# Patient Record
Sex: Female | Born: 1950 | Race: White | Hispanic: No | Smoking: Never smoker
Health system: Southern US, Community
[De-identification: ages and names within clinical notes are randomized; demographics above are authoritative.]

## PROBLEM LIST (undated history)

## (undated) ENCOUNTER — Ambulatory Visit

## (undated) ENCOUNTER — Encounter: Attending: Rheumatology | Primary: Rheumatology

## (undated) ENCOUNTER — Encounter

## (undated) ENCOUNTER — Telehealth: Attending: Ambulatory Care | Primary: Ambulatory Care

## (undated) ENCOUNTER — Telehealth

## (undated) ENCOUNTER — Ambulatory Visit: Payer: MEDICARE | Attending: Rheumatology | Primary: Rheumatology

## (undated) ENCOUNTER — Ambulatory Visit: Payer: MEDICARE

## (undated) ENCOUNTER — Encounter: Attending: Ambulatory Care | Primary: Ambulatory Care

## (undated) ENCOUNTER — Encounter: Attending: Pharmacist | Primary: Pharmacist

## (undated) DIAGNOSIS — I272 Pulmonary hypertension, unspecified: Secondary | ICD-10-CM

## (undated) DIAGNOSIS — D649 Anemia, unspecified: Secondary | ICD-10-CM

## (undated) DIAGNOSIS — T7840XA Allergy, unspecified, initial encounter: Secondary | ICD-10-CM

## (undated) DIAGNOSIS — M810 Age-related osteoporosis without current pathological fracture: Secondary | ICD-10-CM

## (undated) DIAGNOSIS — Z5189 Encounter for other specified aftercare: Secondary | ICD-10-CM

## (undated) DIAGNOSIS — I2510 Atherosclerotic heart disease of native coronary artery without angina pectoris: Secondary | ICD-10-CM

## (undated) DIAGNOSIS — I509 Heart failure, unspecified: Secondary | ICD-10-CM

## (undated) DIAGNOSIS — J849 Interstitial pulmonary disease, unspecified: Secondary | ICD-10-CM

## (undated) DIAGNOSIS — E119 Type 2 diabetes mellitus without complications: Secondary | ICD-10-CM

## (undated) DIAGNOSIS — H269 Unspecified cataract: Secondary | ICD-10-CM

## (undated) DIAGNOSIS — I10 Essential (primary) hypertension: Secondary | ICD-10-CM

## (undated) DIAGNOSIS — M069 Rheumatoid arthritis, unspecified: Secondary | ICD-10-CM

## (undated) HISTORY — DX: Interstitial pulmonary disease, unspecified: J84.9

## (undated) HISTORY — DX: Type 2 diabetes mellitus without complications: E11.9

## (undated) HISTORY — DX: Atherosclerotic heart disease of native coronary artery without angina pectoris: I25.10

## (undated) HISTORY — DX: Essential (primary) hypertension: I10

## (undated) HISTORY — DX: Allergy, unspecified, initial encounter: T78.40XA

## (undated) HISTORY — DX: Heart failure, unspecified: I50.9

## (undated) HISTORY — PX: CORONARY ANGIOPLASTY: SHX604

## (undated) HISTORY — DX: Anemia, unspecified: D64.9

## (undated) HISTORY — DX: Pulmonary hypertension, unspecified: I27.20

## (undated) HISTORY — DX: Age-related osteoporosis without current pathological fracture: M81.0

## (undated) HISTORY — DX: Encounter for other specified aftercare: Z51.89

## (undated) HISTORY — PX: BREAST BIOPSY: SHX20

## (undated) HISTORY — DX: Unspecified cataract: H26.9

---
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
## 2009-05-25 ENCOUNTER — Emergency Department: Payer: Self-pay | Admitting: Emergency Medicine

## 2009-05-26 ENCOUNTER — Ambulatory Visit: Payer: Self-pay | Admitting: Internal Medicine

## 2009-05-31 ENCOUNTER — Ambulatory Visit: Payer: Self-pay | Admitting: Internal Medicine

## 2009-06-07 ENCOUNTER — Ambulatory Visit: Payer: Self-pay | Admitting: Internal Medicine

## 2009-06-15 ENCOUNTER — Ambulatory Visit: Payer: Self-pay | Admitting: Specialist

## 2009-06-22 ENCOUNTER — Ambulatory Visit: Payer: Self-pay | Admitting: Internal Medicine

## 2009-06-29 ENCOUNTER — Ambulatory Visit: Payer: Self-pay | Admitting: Internal Medicine

## 2009-07-05 ENCOUNTER — Ambulatory Visit: Payer: Self-pay | Admitting: Internal Medicine

## 2009-07-13 ENCOUNTER — Ambulatory Visit: Payer: Self-pay | Admitting: Internal Medicine

## 2009-07-21 ENCOUNTER — Ambulatory Visit: Payer: Self-pay | Admitting: Internal Medicine

## 2009-07-27 ENCOUNTER — Ambulatory Visit: Payer: Self-pay | Admitting: Internal Medicine

## 2010-08-15 ENCOUNTER — Ambulatory Visit: Payer: Self-pay | Admitting: Physician Assistant

## 2016-11-13 MED FILL — METHOTREXATE/25MG/ML/SOLN: METHOTREXATE/25MG/ML/SOLN | 28 days supply | Qty: 2 | Fill #2

## 2016-11-13 MED FILL — LEUCOVORIN/5MG/TAB: LEUCOVORIN/5MG/TAB | 84 days supply | Qty: 12 | Fill #1

## 2016-11-29 ENCOUNTER — Ambulatory Visit
Admission: RE | Admit: 2016-11-29 | Discharge: 2016-11-29 | Disposition: A | Discharge status: Home / Self Care | Payer: MEDICARE | Attending: Medical | Admitting: Medical

## 2016-11-29 DIAGNOSIS — Z79899 Other long term (current) drug therapy: Secondary | ICD-10-CM

## 2016-11-29 DIAGNOSIS — M05741 Rheumatoid arthritis with rheumatoid factor of right hand without organ or systems involvement: Secondary | ICD-10-CM

## 2016-11-29 DIAGNOSIS — M059 Rheumatoid arthritis with rheumatoid factor, unspecified: Principal | ICD-10-CM

## 2016-11-29 DIAGNOSIS — M05742 Rheumatoid arthritis with rheumatoid factor of left hand without organ or systems involvement: Secondary | ICD-10-CM

## 2016-11-29 DIAGNOSIS — M8580 Other specified disorders of bone density and structure, unspecified site: Secondary | ICD-10-CM

## 2016-11-29 DIAGNOSIS — M0579 Rheumatoid arthritis with rheumatoid factor of multiple sites without organ or systems involvement: Secondary | ICD-10-CM

## 2016-11-29 DIAGNOSIS — Z7952 Long term (current) use of systemic steroids: Secondary | ICD-10-CM

## 2016-11-29 DIAGNOSIS — E559 Vitamin D deficiency, unspecified: Secondary | ICD-10-CM

## 2016-11-29 DIAGNOSIS — M153 Secondary multiple arthritis: Secondary | ICD-10-CM

## 2016-11-29 MED ORDER — DICLOFENAC 1 % TOPICAL GEL
Freq: Four times a day (QID) | TOPICAL | 3 refills | 0 days | Status: CP
Start: 2016-11-29 — End: 2017-02-21

## 2016-11-29 MED ORDER — LEUCOVORIN CALCIUM 5 MG TABLET
ORAL_TABLET | ORAL | 0 refills | 0.00000 days | Status: CP
Start: 2016-11-29 — End: 2016-12-12

## 2016-11-29 MED ORDER — METHOTREXATE SODIUM 25 MG/ML INJECTION SOLUTION
SUBCUTANEOUS | 0 refills | 0.00000 days | Status: CP
Start: 2016-11-29 — End: 2016-12-12

## 2016-12-12 MED ORDER — SYRINGE WITH NEEDLE 1 ML 25 GAUGE X 5/8"
0 refills | 0.00000 days | Status: CP
Start: 2016-12-12 — End: 2016-12-12

## 2016-12-12 MED ORDER — LEUCOVORIN CALCIUM 5 MG TABLET
ORAL_TABLET | ORAL | 2 refills | 0.00000 days | Status: CP
Start: 2016-12-12 — End: 2017-08-22

## 2016-12-12 MED ORDER — SYRINGE WITH NEEDLE 1 ML 25 GAUGE X 5/8": 1 [IU] | Syringe | 0 refills | 0 days | Status: AC

## 2016-12-12 MED ORDER — LEUCOVORIN CALCIUM 5 MG TABLET: tablet | 2 refills | 0 days

## 2016-12-12 MED ORDER — METHOTREXATE SODIUM 25 MG/ML INJECTION SOLUTION: mL | 2 refills | 0 days

## 2016-12-12 MED ORDER — METHOTREXATE SODIUM 25 MG/ML INJECTION SOLUTION
SUBCUTANEOUS | 2 refills | 0.00000 days | Status: CP
Start: 2016-12-12 — End: 2016-12-12

## 2016-12-12 NOTE — Unmapped (Signed)
Addended by: Geoffry Paradise T on: 12/12/2016 03:54 PM     Modules accepted: Orders

## 2016-12-12 NOTE — Unmapped (Addendum)
Specialty Pharmacy - [facility] Rheumatology Telephone Call     Amber Stafford is a 66 y.o. female contacted today regarding assessment of her specialty medication(s): subq MTX  Amber Stafford is also on Humira but receiving it through the [facility] [employer].    Reviewed and verified with patient: Allergies - Medications -      Specialty medication(s) and dose(s) confirmed: yes  Changes to medications: no  Changes to insurance: no    Amber Stafford reports her 11/14/15 shipment of methotrexate and leucovorin did not arrive from [facility].  She filed a report with [employer] but did not have an answer as of today. She missed 3 doses of methotrexate thus far.  She is doing well on methotrexate and humira w/o any side effects. RA under controlled.  Plan to ship methotrexate, leucovorin and syringes w/ needles tomorrow for delivery on 12/14/16.  Patient will restart methotrexate on 12/14/16.      Does Amber Stafford have follow up appointment scheduled with clinic? Yes, appointment is scheduled and patient is aware    All questions were answered and contact information provided for any future questions/concerns.      Jeneen Montgomery

## 2016-12-13 MED FILL — BD TB 1mL/25G 5/8 INCH/SYR: BD TB 1mL/25G 5/8 INCH/SYR | 700 days supply | Qty: 100 | Fill #3

## 2016-12-13 MED FILL — LEUCOVORIN/5MG/TAB: LEUCOVORIN/5MG/TAB | 84 days supply | Qty: 12 | Fill #2

## 2016-12-13 MED FILL — METHOTREXATE/25MG/ML/SOLN: METHOTREXATE/25MG/ML/SOLN | 84 days supply | Qty: 6 | Fill #0

## 2017-01-21 ENCOUNTER — Ambulatory Visit
Admission: RE | Admit: 2017-01-21 | Discharge: 2017-01-21 | Admitting: Student in an Organized Health Care Education/Training Program

## 2017-01-21 DIAGNOSIS — D229 Melanocytic nevi, unspecified: Secondary | ICD-10-CM

## 2017-01-21 DIAGNOSIS — Z85828 Personal history of other malignant neoplasm of skin: Secondary | ICD-10-CM

## 2017-01-21 DIAGNOSIS — L820 Inflamed seborrheic keratosis: Principal | ICD-10-CM

## 2017-01-21 MED ORDER — TRETINOIN 0.025 % TOPICAL CREAM
Freq: Every evening | TOPICAL | 11 refills | 0.00000 days | Status: CP
Start: 2017-01-21 — End: ?

## 2017-01-21 NOTE — Unmapped (Addendum)
Topical Retinoid (Differin, adapalene, tretinoin)   The patient was encouraged to wash the face and let it dry completely before applying the retinoid, about 20 minutes.  The patient will begin using the topical every Monday and Thursday and increase the frequency to nightly as tolerated over several weeks??? time.    Patient is to avoid the upper eyelid area.  1 Pea size amount of medication should cover the whole face.  Apply by dotting small amounts on the skin.  If using to the scalp as well, an additional pea will be needed.     Please go to [email or web address] for a coupon for coverage of the medication if the insurance cost is high or the medication is not covered   Insurance often does not often cover this medication except for acne.       Cryosurgery  Cryosurgery (???freezing???) uses liquid nitrogen to destroy certain types of skin lesions. Lowering the temperature of the lesion in a small area surrounding skin destroys the lesion. Immediately following cryosurgery, you will notice redness and swelling of the treatment area. Blistering or weeping may occur, lasting approximately one week which will then be followed by crusting. Most areas will heal completely in 10 to 14 days.    Wash the treated areas daily. Allow soap and water to run over the areas, but do not scrub. Should a scab or crust form, allow it to fall off on its own. Do not remove or pick at it. Application of an ointment  and a bandage may make you feel more comfortable, but it is not necessary. Some people develop an allergy to Neosporin, so we recommend that Vaseline or  Aquaphor be used.    The cryotherapy site will be more sensitive than your surrounding skin. Keep it covered, and remember to apply sunscreen every day to all your sun exposed skin. A scar may remain which is lighter or pinker than your normal skin. Your body will continue to improve your scar for up to one year; however a light-colored scar may remain.    Infection following cryotherapy is rare. However if you are worried about the appearance of the treated area, contact your doctor. We have a physician on call at all times. If you have any concerns about the site, please call our clinic at [phone]

## 2017-01-21 NOTE — Unmapped (Signed)
ASSESSMENT AND PLAN  Amber Stafford is a 66 y.o. female who presents for follow up.    1. Inflamed Seborrheic Keratoses    -- Discussed the benign nature of the diagnosis and treatment options for inflamed lesions.  Due to history and evidence of irritation and inflammation, we recommended treatment with cryotherapy today.  -- After verbal consent was obtained a total of >15 lesions on the abdomen, back, and flank were treated wth cryotherapy x 1 ten second freeze-thaw cycle. The patient tolerated the procedure well, and was instructed on post-cryotherapy wound care.  A handout was provided.    2. Lentigines, rhytides: Tretinoin 0.025% cream applied in thin layer to face each night.  Appropriate use and expected reaction such as erythema and scale were discussed, as well as decreasing frequency of application with significant reaction.    3. History of non-melanoma skin cancer: ,We discussed the importance of sun-protective and sun-avoidance measures, as well as the ABCDEs of melanoma and the importance of regular self-skin exams.    RTC: 1 year      No Follow-up on file.     _____________________________________________________________________    SUBJECTIVE  Amber Stafford is a 66 y.o. female last seen by Dr. Janyth Contes 6 months ago returns for skin check with history of NMSC.  Points out many crusty areas that frequently are painful and bleed due to friction with her bra or other inadvertent trauma.  They bleed sometimes.    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    No additional skin concerns today.     AST MEDICAL HISTORY  Past Medical History   Diagnosis Date   ??? RA (rheumatoid arthritis) - on Humira, methotrexate and prednisone 7.5mg     ??? Skin tag    ??? Varicella       REVIEW OF SYSTEMS  Good interval health without any recent illnesses or hospitalizations.  Denies fevers, chills, fatigue and weight changes. No other skin concerns.     PHYSICAL EXAM  General: Well appearing female who is alert and oriented in no distress.  Skin: Examination of the scalp, face including lips and eyelids, neck, chest, back, abdomen, bilateral upper extremities was performed and significant for the following:  1. Scattered thick warty papules and plaques on the trunk, many with an erythematous base and excoriations.  2. Well-healed scar on R forehead

## 2017-02-19 NOTE — Unmapped (Signed)
Patient asking about information

## 2017-02-21 ENCOUNTER — Ambulatory Visit
Admission: RE | Admit: 2017-02-21 | Discharge: 2017-02-21 | Disposition: A | Discharge status: Home / Self Care | Payer: MEDICARE | Attending: Rheumatology | Admitting: Rheumatology

## 2017-02-21 DIAGNOSIS — M153 Secondary multiple arthritis: Secondary | ICD-10-CM

## 2017-02-21 DIAGNOSIS — M05749 Rheumatoid arthritis with rheumatoid factor of unspecified hand without organ or systems involvement: Principal | ICD-10-CM

## 2017-02-21 MED ORDER — TRAMADOL 50 MG TABLET
ORAL_TABLET | 3 refills | 0 days | Status: CP
Start: 2017-02-21 — End: 2017-11-21

## 2017-02-21 MED ORDER — DICLOFENAC 1 % TOPICAL GEL
Freq: Four times a day (QID) | TOPICAL | 6 refills | 0 days | Status: CP
Start: 2017-02-21 — End: 2017-08-22

## 2017-02-21 NOTE — Unmapped (Signed)
Cc:   Chief Complaint   Patient presents with   ??? Follow-up     RA    .    HPI:  The patient is a 66 y.o. 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    Ms. Voris was seen in rheumatology clinic 07/2016 at which point she was noted to have active arthritis with significant symptom burden and thus had intensification of her immunosuppression from 20mg  to 25mg  methotrexate weekly. At follow-up in 11/2016 she noted some symptomatic improvement and she was thought to have better disease control at the time. She started PRN voltaren gel at that time but had no changes to her immunosuppression. She reports that voltaren made a significant difference in her right ankle and left knee pain, which are her most symptomatic joints. Over the last 2-4 weeks, however, she has also noted an overall improvement in her joint pains and well-being. Her morning stiffness is down to an hour, whereas it had previously lasted until nearly noon on a daily basis. She has been able to wean her prednisone to 7.5mg  on most days, with 5mg  on days she is feeling especially well - this is an improvement from 10mg  daily in 07/2016 and 7.5/10mg  in 11/2016.    She had some mild GI upset when going up to 25mg  methotrexate weekly but it improved when she started to take the methotrexate at night and then take her folinic acid earlier the following day. This has been a non-issue for her as of late. She has not suffered an oral sores/ulcers. She denies any other new symptoms, diagnoses or medication changes. She has had some life stress associated with moving her elderly mother into a nursing home.    ROS:  10 systems were reviewed and negative except as noted in the HPI.    Past Medical History:   Diagnosis Date   ??? Arthritis 04/30/1970   ??? Cataract 04/30/2008   ??? Depression with anxiety    ??? DJD (degenerative joint disease), ankle and foot    ??? Fractures 11/01/1971   ??? Hypertension, benign    ??? Joint pain    ??? Primary localized osteoarthrosis, ankle and foot    ??? RA (rheumatoid arthritis) (CMS-HCC)    ??? Skin tag    ??? Varicella        MEDICATIONS:    Current Outpatient Prescriptions:   ???  adalimumab (HUMIRA) 40 mg/0.8 mL subcutaneous pen kit, Inject 0.8 mL (40 mg total) under the skin every fourteen (14) days., Disp: 2 each, Rfl: 11  ???  calcium carbonate (OS-CAL) 500 mg calcium (1,250 mg) chewable tablet, Chew 1 tablet daily., Disp: , Rfl:   ???  cholecalciferol, vitamin D3, (VITAMIN D3) 4,000 unit cap, Take by mouth., Disp: , Rfl:   ???  cyanocobalamin (VITAMIN B-12) 1000 MCG tablet, Take 100 mcg by mouth daily., Disp: , Rfl:   ???  diclofenac sodium (VOLTAREN) 1 % gel, Apply 4 g topically Four (4) times a day., Disp: 300 g, Rfl: 6  ???  leucovorin (WELLCOVORIN) 5 mg tablet, Take 1 tablet (5 mg total) by mouth once a week., Disp: 12 tablet, Rfl: 2  ???  methotrexate 25 mg/mL injection solution, Inject 1 mL (25 mg total) under the skin once a week., Disp: 12 mL, Rfl: 2  ???  multivitamin,tx-iron-minerals (COMPLETE MULTIVITAMIN) Tab, Take 1 tablet by mouth daily., Disp: ,  Rfl:   ???  omeprazole 20 mg tablet, Take 20 mg by mouth daily., Disp: , Rfl:   ???  predniSONE (DELTASONE) 5 MG tablet, Take 5 mg to 10 mg daily., Disp: 60 tablet, Rfl: 6  ???  syringe with needle (TUBERCULIN SYRINGE) 1 mL 25 gauge x 5/8 Syrg, 1 Units by Miscellaneous route every seven (7) days. To be used with methotrexate, Disp: 100 Syringe, Rfl: 0  ???  traMADol (ULTRAM) 50 mg tablet, Take 1 tablet (50 mg total) by mouth every six (6) hours as needed for pain., Disp: 90 tablet, Rfl: 3  ???  tretinoin (RETIN-A) 0.025 % cream, Apply 1 application topically nightly., Disp: 45 g, Rfl: 11    PHYSICAL EXAM:  BP 133/62  - Pulse 78  - Temp 36.3 ??C (Oral)  - Resp 16  - Ht 172.7 cm (5' 8)  - Wt 70.9 kg (156 lb 3.2 oz)  - LMP  (LMP Unknown)  - BMI 23.75 kg/m?? GENERAL: Well-appearing, sitting comfortably on chair using smart phone upon my entry. Wearing a Data processing manager. Appears stated age. Pleasant.  DERMATOLOGICAL exam: Small erythematous papules on neck bilaterally. Scattered seborrheic keratoses on back.  Ocular exam: Sclera and conjunctiva anicteric and non-injected.   ENT: Poor dentition, missing multiple teeth. Oral mucosa moist without exudates or ulcers. No palpable adenopathy.  RESPIRATORY: Normal WOB on room air. Lungs CTA.  CV: Normal rate, regular rhythm. No murmur. No JVD.    GI: Soft, non-distended. Active bowel sounds.  MUSCULOSKELETAL EXAM:  Hands with bilateral evidence of chronic RA including ulnar deviation, palmar subluxation and z-deformity of the thumb, right > left  No warmth, swelling or tenderness of wrists, MCPs, PIPs or DIPs  Normal elbow and shoulder ROM, without swelling or tenderness  Mild bilateral knee effusions without warmth or joint line tenderness, left > right  Ankylosis with surrounding soft tissue swelling but no tenderness to palpation of ankles bilaterally, right > left    IMPRESSION:  Patient appears to have gained significant control over her rheumatoid arthritis recently, for reasons that are not entirely clear. While addition of diclofenac gel could certainly account for symptomatic improvement in her DJD-affected ankle and knee, she does report improvement in symptoms suggestive of improved control of her inflammatory arthritis (less morning stiffness, overall decrease in joint pain, increased overall well-being, ability to further wean prednison). The increase in methotrexate dose in 07/2016 did show a beneficial effect by the time of her return in 11/2016 but would not be expected to cause continued improvement this long after the change. Regardless, her arthritis to be adequately controlled at this time, so further changes will not be pursued. Still favor relatively short-duration follow-up as the genesis of her improvement is not entirely clear, and may simply represent natural fluctuation in disease activity.    PLAN:  - Continue methotrexate 25mg  subQ weekly  - Continue folinic acid 5mg  weekly  - Continue humira 40mg  every two weeks  - Continue to wean prednisone to 5mg  daily as tolerated  - Influenza vaccination  - DEXA scan    RTC in 3 months

## 2017-02-27 NOTE — Unmapped (Signed)
INSURANCE PLAN IS MAKING HER SWITCH PHARMACIES. I TOLD HER WE WOULD FOLLOW UP WITH HER ONE MORE TIME IN NOVEMBER TO ENSURE SHE GOT HER MEDS BEFORE REMOVING HER FROM OUR CALL LIST. RESCHEDULING CALL TO: NOV. 26.

## 2017-04-03 MED ORDER — METHOTREXATE SODIUM 25 MG/ML INJECTION SOLUTION
SUBCUTANEOUS | 6 refills | 0 days | Status: CP
Start: 2017-04-03 — End: 2017-08-20

## 2017-04-03 MED ORDER — LEUCOVORIN CALCIUM 5 MG TABLET
ORAL_TABLET | ORAL | 12 refills | 0 days | Status: CP
Start: 2017-04-03 — End: 2017-08-22

## 2017-04-03 NOTE — Unmapped (Signed)
Labs done 11/29/16

## 2017-04-09 NOTE — Unmapped (Signed)
PT STATED THAT SHE IS GETTING NEXT ROUND FROM ANOTHER PHARMACY. I WILL CALL BACK AND TOUCH BASE IN January.

## 2017-05-15 NOTE — Unmapped (Signed)
Yearly colon cancer screening due; FIT order placed and released. FIT mailed to home address in EPIC with letter explaining the reason and instructions for the kit.  Patient notified via MyChart re same.

## 2017-05-20 NOTE — Unmapped (Signed)
PT CAN NO LONGER FILL AT [facility]. DISENROLLING PT.

## 2017-05-28 MED ORDER — PREDNISONE 5 MG TABLET
ORAL_TABLET | 6 refills | 0 days | Status: CP
Start: 2017-05-28 — End: 2017-08-22

## 2017-05-30 ENCOUNTER — Ambulatory Visit: Admit: 2017-05-30 | Discharge: 2017-05-31 | Payer: MEDICARE

## 2017-05-30 DIAGNOSIS — Z1211 Encounter for screening for malignant neoplasm of colon: Principal | ICD-10-CM

## 2017-08-20 MED ORDER — METHOTREXATE SODIUM 25 MG/ML INJECTION SOLUTION
SUBCUTANEOUS | 4 refills | 0.00000 days | Status: CP
Start: 2017-08-20 — End: 2017-08-22

## 2017-08-22 ENCOUNTER — Ambulatory Visit: Admit: 2017-08-22 | Discharge: 2017-08-23 | Payer: MEDICARE | Attending: Rheumatology | Primary: Rheumatology

## 2017-08-22 DIAGNOSIS — M810 Age-related osteoporosis without current pathological fracture: Secondary | ICD-10-CM

## 2017-08-22 DIAGNOSIS — M059 Rheumatoid arthritis with rheumatoid factor, unspecified: Secondary | ICD-10-CM

## 2017-08-22 DIAGNOSIS — M05749 Rheumatoid arthritis with rheumatoid factor of unspecified hand without organ or systems involvement: Principal | ICD-10-CM

## 2017-08-22 DIAGNOSIS — Z79899 Other long term (current) drug therapy: Secondary | ICD-10-CM

## 2017-08-22 DIAGNOSIS — M153 Secondary multiple arthritis: Secondary | ICD-10-CM

## 2017-08-22 DIAGNOSIS — M05721 Rheumatoid arthritis with rheumatoid factor of right elbow without organ or systems involvement: Secondary | ICD-10-CM

## 2017-08-22 DIAGNOSIS — M05722 Rheumatoid arthritis with rheumatoid factor of left elbow without organ or systems involvement: Secondary | ICD-10-CM

## 2017-08-22 MED ORDER — SYRINGE WITH NEEDLE 1 ML 25 GAUGE X 5/8"
INJECTION | 3 refills | 0 days | Status: CP
Start: 2017-08-22 — End: 2018-02-13

## 2017-08-22 MED ORDER — METHOTREXATE SODIUM 25 MG/ML INJECTION SOLUTION
SUBCUTANEOUS | 6 refills | 0 days | Status: CP
Start: 2017-08-22 — End: 2018-02-13

## 2017-08-22 MED ORDER — DICLOFENAC 1 % TOPICAL GEL
Freq: Four times a day (QID) | TOPICAL | 6 refills | 0 days | Status: CP
Start: 2017-08-22 — End: 2018-08-22

## 2017-08-22 MED ORDER — LEUCOVORIN CALCIUM 5 MG TABLET
ORAL_TABLET | ORAL | 6 refills | 0.00000 days | Status: CP
Start: 2017-08-22 — End: 2017-11-21

## 2017-08-22 MED ORDER — PREDNISONE 5 MG TABLET
ORAL_TABLET | Freq: Every day | ORAL | 6 refills | 0 days | Status: CP
Start: 2017-08-22 — End: 2018-02-13

## 2017-09-04 ENCOUNTER — Ambulatory Visit: Admit: 2017-09-04 | Discharge: 2017-09-04 | Payer: MEDICARE

## 2017-09-04 DIAGNOSIS — M810 Age-related osteoporosis without current pathological fracture: Principal | ICD-10-CM

## 2017-09-04 DIAGNOSIS — M05749 Rheumatoid arthritis with rheumatoid factor of unspecified hand without organ or systems involvement: Secondary | ICD-10-CM

## 2017-11-21 ENCOUNTER — Ambulatory Visit: Admit: 2017-11-21 | Discharge: 2017-11-21 | Payer: MEDICARE

## 2017-11-21 DIAGNOSIS — Z79899 Other long term (current) drug therapy: Secondary | ICD-10-CM

## 2017-11-21 DIAGNOSIS — M059 Rheumatoid arthritis with rheumatoid factor, unspecified: Principal | ICD-10-CM

## 2017-11-21 DIAGNOSIS — M8580 Other specified disorders of bone density and structure, unspecified site: Secondary | ICD-10-CM

## 2017-11-21 DIAGNOSIS — R05 Cough: Secondary | ICD-10-CM

## 2017-11-21 DIAGNOSIS — E559 Vitamin D deficiency, unspecified: Secondary | ICD-10-CM

## 2017-11-21 MED ORDER — ADALIMUMAB 40 MG/0.8 ML SUBCUTANEOUS PEN KIT: each | 3 refills | 0 days

## 2017-11-21 MED ORDER — ADALIMUMAB 40 MG/0.8 ML SUBCUTANEOUS PEN KIT
SUBCUTANEOUS | 3 refills | 0.00000 days | Status: CP
Start: 2017-11-21 — End: 2017-11-21

## 2017-11-21 MED ORDER — TRAMADOL 50 MG TABLET
ORAL_TABLET | Freq: Four times a day (QID) | ORAL | 5 refills | 0 days | Status: CP | PRN
Start: 2017-11-21 — End: 2018-08-21

## 2017-11-21 MED ORDER — LEUCOVORIN CALCIUM 5 MG TABLET
ORAL_TABLET | ORAL | 6 refills | 0 days | Status: CP
Start: 2017-11-21 — End: 2018-02-13

## 2018-02-13 ENCOUNTER — Ambulatory Visit: Admit: 2018-02-13 | Discharge: 2018-02-14 | Payer: MEDICARE | Attending: Rheumatology | Primary: Rheumatology

## 2018-02-13 DIAGNOSIS — M05741 Rheumatoid arthritis with rheumatoid factor of right hand without organ or systems involvement: Principal | ICD-10-CM

## 2018-02-13 DIAGNOSIS — M05742 Rheumatoid arthritis with rheumatoid factor of left hand without organ or systems involvement: Secondary | ICD-10-CM

## 2018-02-13 DIAGNOSIS — M8589 Other specified disorders of bone density and structure, multiple sites: Secondary | ICD-10-CM

## 2018-02-13 DIAGNOSIS — M19079 Primary osteoarthritis, unspecified ankle and foot: Secondary | ICD-10-CM

## 2018-02-13 DIAGNOSIS — M059 Rheumatoid arthritis with rheumatoid factor, unspecified: Secondary | ICD-10-CM

## 2018-02-13 DIAGNOSIS — M05722 Rheumatoid arthritis with rheumatoid factor of left elbow without organ or systems involvement: Secondary | ICD-10-CM

## 2018-02-13 DIAGNOSIS — M05721 Rheumatoid arthritis with rheumatoid factor of right elbow without organ or systems involvement: Secondary | ICD-10-CM

## 2018-02-13 MED ORDER — METHOTREXATE SODIUM 25 MG/ML INJECTION SOLUTION
SUBCUTANEOUS | 6 refills | 0.00000 days | Status: CP
Start: 2018-02-13 — End: 2018-08-21

## 2018-02-13 MED ORDER — PREDNISONE 5 MG TABLET
ORAL_TABLET | Freq: Every day | ORAL | 6 refills | 0 days | Status: CP
Start: 2018-02-13 — End: 2018-08-21

## 2018-02-13 MED ORDER — ADALIMUMAB 40 MG/0.8 ML SUBCUTANEOUS PEN KIT
SUBCUTANEOUS | 3 refills | 0 days | Status: CP
Start: 2018-02-13 — End: 2018-06-10

## 2018-02-13 MED ORDER — SYRINGE WITH NEEDLE 1 ML 27 X 1/2"
INJECTION | 6 refills | 0 days | Status: CP
Start: 2018-02-13 — End: ?

## 2018-02-13 MED ORDER — LEUCOVORIN CALCIUM 5 MG TABLET
ORAL_TABLET | ORAL | 6 refills | 0 days | Status: CP
Start: 2018-02-13 — End: 2018-08-21

## 2018-06-10 MED ORDER — ADALIMUMAB 40 MG/0.8 ML SUBCUTANEOUS PEN KIT
SUBCUTANEOUS | 3 refills | 0 days | Status: CP
Start: 2018-06-10 — End: 2019-06-10

## 2018-07-21 DIAGNOSIS — M05749 Rheumatoid arthritis with rheumatoid factor of unspecified hand without organ or systems involvement: Principal | ICD-10-CM

## 2018-08-21 ENCOUNTER — Institutional Professional Consult (permissible substitution): Admit: 2018-08-21 | Discharge: 2018-08-22 | Payer: MEDICARE | Attending: Rheumatology | Primary: Rheumatology

## 2018-08-21 DIAGNOSIS — M05722 Rheumatoid arthritis with rheumatoid factor of left elbow without organ or systems involvement: Secondary | ICD-10-CM

## 2018-08-21 DIAGNOSIS — M059 Rheumatoid arthritis with rheumatoid factor, unspecified: Principal | ICD-10-CM

## 2018-08-21 DIAGNOSIS — M05742 Rheumatoid arthritis with rheumatoid factor of left hand without organ or systems involvement: Secondary | ICD-10-CM

## 2018-08-21 DIAGNOSIS — M05741 Rheumatoid arthritis with rheumatoid factor of right hand without organ or systems involvement: Secondary | ICD-10-CM

## 2018-08-21 DIAGNOSIS — M8589 Other specified disorders of bone density and structure, multiple sites: Secondary | ICD-10-CM

## 2018-08-21 DIAGNOSIS — M0579 Rheumatoid arthritis with rheumatoid factor of multiple sites without organ or systems involvement: Secondary | ICD-10-CM

## 2018-08-21 DIAGNOSIS — M05721 Rheumatoid arthritis with rheumatoid factor of right elbow without organ or systems involvement: Secondary | ICD-10-CM

## 2018-08-21 MED ORDER — METHOTREXATE SODIUM 25 MG/ML INJECTION SOLUTION
SUBCUTANEOUS | 6 refills | 0 days | Status: CP
Start: 2018-08-21 — End: ?

## 2018-08-21 MED ORDER — PREDNISONE 5 MG TABLET
ORAL_TABLET | Freq: Every day | ORAL | 6 refills | 0 days | Status: CP
Start: 2018-08-21 — End: ?

## 2018-08-21 MED ORDER — LEUCOVORIN CALCIUM 5 MG TABLET
ORAL_TABLET | ORAL | 6 refills | 0.00000 days | Status: CP
Start: 2018-08-21 — End: ?

## 2018-08-21 MED ORDER — TRAMADOL 50 MG TABLET
ORAL_TABLET | Freq: Four times a day (QID) | ORAL | 2 refills | 0 days | Status: CP | PRN
Start: 2018-08-21 — End: ?

## 2018-09-03 ENCOUNTER — Ambulatory Visit: Admit: 2018-09-03 | Discharge: 2018-09-04 | Payer: MEDICARE

## 2018-09-03 DIAGNOSIS — M059 Rheumatoid arthritis with rheumatoid factor, unspecified: Principal | ICD-10-CM

## 2019-02-12 ENCOUNTER — Institutional Professional Consult (permissible substitution): Admit: 2019-02-12 | Discharge: 2019-02-13 | Payer: MEDICARE | Attending: Rheumatology | Primary: Rheumatology

## 2019-02-12 MED ORDER — TRAMADOL 50 MG TABLET: 50 mg | tablet | Freq: Four times a day (QID) | 2 refills | 30 days | Status: AC

## 2019-02-12 MED ORDER — PREDNISONE 5 MG TABLET: 5 mg | tablet | Freq: Every day | 6 refills | 60 days | Status: AC

## 2019-02-12 MED ORDER — METHOTREXATE SODIUM 25 MG/ML INJECTION SOLUTION: 25 mg | mL | 6 refills | 28 days | Status: AC

## 2019-02-12 MED ORDER — LEUCOVORIN CALCIUM 5 MG TABLET: 15 mg | tablet | 6 refills | 84 days | Status: AC

## 2019-02-12 MED ORDER — BD TUBERCULIN SYRINGE 1 ML 27 X 1/2": 1 | Syringe | 6 refills | 0 days | Status: AC

## 2019-02-12 MED ORDER — DICLOFENAC 1 % TOPICAL GEL: 2 g | g | Freq: Four times a day (QID) | 0 refills | 13 days | Status: AC

## 2019-02-24 DIAGNOSIS — M05742 Rheumatoid arthritis with rheumatoid factor of left hand without organ or systems involvement: Principal | ICD-10-CM

## 2019-02-24 DIAGNOSIS — M05721 Rheumatoid arthritis with rheumatoid factor of right elbow without organ or systems involvement: Principal | ICD-10-CM

## 2019-02-24 DIAGNOSIS — M05722 Rheumatoid arthritis with rheumatoid factor of left elbow without organ or systems involvement: Principal | ICD-10-CM

## 2019-02-24 DIAGNOSIS — M0579 Rheumatoid arthritis with rheumatoid factor of multiple sites without organ or systems involvement: Principal | ICD-10-CM

## 2019-02-24 DIAGNOSIS — M059 Rheumatoid arthritis with rheumatoid factor, unspecified: Principal | ICD-10-CM

## 2019-02-24 DIAGNOSIS — M05741 Rheumatoid arthritis with rheumatoid factor of right hand without organ or systems involvement: Principal | ICD-10-CM

## 2019-02-24 MED ORDER — PREDNISONE 5 MG TABLET
ORAL_TABLET | Freq: Every day | ORAL | 2 refills | 30 days | Status: CP
Start: 2019-02-24 — End: ?

## 2019-02-25 DIAGNOSIS — M05722 Rheumatoid arthritis with rheumatoid factor of left elbow without organ or systems involvement: Principal | ICD-10-CM

## 2019-02-25 DIAGNOSIS — M05721 Rheumatoid arthritis with rheumatoid factor of right elbow without organ or systems involvement: Principal | ICD-10-CM

## 2019-02-25 MED ORDER — HUMIRA PEN CITRATE FREE 40 MG/0.4 ML
SUBCUTANEOUS | 3 refills | 84.00000 days | Status: CP
Start: 2019-02-25 — End: ?

## 2019-02-26 DIAGNOSIS — M05722 Rheumatoid arthritis with rheumatoid factor of left elbow without organ or systems involvement: Principal | ICD-10-CM

## 2019-02-26 DIAGNOSIS — M05721 Rheumatoid arthritis with rheumatoid factor of right elbow without organ or systems involvement: Principal | ICD-10-CM

## 2019-05-08 ENCOUNTER — Institutional Professional Consult (permissible substitution): Admit: 2019-05-08 | Discharge: 2019-05-09 | Payer: MEDICARE | Attending: Rheumatology | Primary: Rheumatology

## 2019-05-08 DIAGNOSIS — M05722 Rheumatoid arthritis with rheumatoid factor of left elbow without organ or systems involvement: Principal | ICD-10-CM

## 2019-05-08 DIAGNOSIS — M05721 Rheumatoid arthritis with rheumatoid factor of right elbow without organ or systems involvement: Secondary | ICD-10-CM

## 2019-05-08 DIAGNOSIS — M059 Rheumatoid arthritis with rheumatoid factor, unspecified: Principal | ICD-10-CM

## 2019-05-08 DIAGNOSIS — M05741 Rheumatoid arthritis with rheumatoid factor of right hand without organ or systems involvement: Principal | ICD-10-CM

## 2019-05-08 DIAGNOSIS — M0579 Rheumatoid arthritis with rheumatoid factor of multiple sites without organ or systems involvement: Principal | ICD-10-CM

## 2019-05-08 DIAGNOSIS — M8589 Other specified disorders of bone density and structure, multiple sites: Principal | ICD-10-CM

## 2019-05-08 DIAGNOSIS — M05742 Rheumatoid arthritis with rheumatoid factor of left hand without organ or systems involvement: Secondary | ICD-10-CM

## 2019-05-08 MED ORDER — METHOTREXATE SODIUM 25 MG/ML INJECTION SOLUTION
SUBCUTANEOUS | 6 refills | 28.00000 days | Status: CP
Start: 2019-05-08 — End: ?

## 2019-05-08 MED ORDER — BD TUBERCULIN SYRINGE 1 ML 27 X 1/2"
INJECTION | 6 refills | 0 days | Status: CP
Start: 2019-05-08 — End: ?

## 2019-05-08 MED ORDER — PREDNISONE 5 MG TABLET
ORAL_TABLET | 6 refills | 0 days | Status: CP
Start: 2019-05-08 — End: ?

## 2019-05-08 MED ORDER — HUMIRA PEN CITRATE FREE 40 MG/0.4 ML
SUBCUTANEOUS | 3 refills | 84 days | Status: CP
Start: 2019-05-08 — End: ?

## 2019-05-08 MED ORDER — LEUCOVORIN CALCIUM 5 MG TABLET
ORAL_TABLET | ORAL | 6 refills | 84 days | Status: CP
Start: 2019-05-08 — End: ?

## 2019-07-07 ENCOUNTER — Ambulatory Visit: Admit: 2019-07-07 | Discharge: 2019-07-08 | Payer: MEDICARE

## 2019-10-15 DIAGNOSIS — M059 Rheumatoid arthritis with rheumatoid factor, unspecified: Principal | ICD-10-CM

## 2019-10-15 DIAGNOSIS — M0579 Rheumatoid arthritis with rheumatoid factor of multiple sites without organ or systems involvement: Principal | ICD-10-CM

## 2019-10-15 DIAGNOSIS — M05741 Rheumatoid arthritis with rheumatoid factor of right hand without organ or systems involvement: Principal | ICD-10-CM

## 2019-10-15 DIAGNOSIS — M05742 Rheumatoid arthritis with rheumatoid factor of left hand without organ or systems involvement: Principal | ICD-10-CM

## 2019-10-15 MED ORDER — METHOTREXATE SODIUM 25 MG/ML INJECTION SOLUTION
SUBCUTANEOUS | 2 refills | 0 days
Start: 2019-10-15 — End: ?

## 2019-10-16 MED ORDER — METHOTREXATE SODIUM 25 MG/ML INJECTION SOLUTION
SUBCUTANEOUS | 0 refills | 84 days | Status: CP
Start: 2019-10-16 — End: ?

## 2019-10-31 DIAGNOSIS — M059 Rheumatoid arthritis with rheumatoid factor, unspecified: Principal | ICD-10-CM

## 2019-10-31 DIAGNOSIS — M0579 Rheumatoid arthritis with rheumatoid factor of multiple sites without organ or systems involvement: Principal | ICD-10-CM

## 2019-10-31 MED ORDER — TRAMADOL 50 MG TABLET
ORAL_TABLET | Freq: Four times a day (QID) | ORAL | 0 refills | 0.00000 days | PRN
Start: 2019-10-31 — End: ?

## 2019-11-03 MED ORDER — TRAMADOL 50 MG TABLET
ORAL_TABLET | Freq: Four times a day (QID) | ORAL | 0 refills | 30.00000 days | Status: CP | PRN
Start: 2019-11-03 — End: ?

## 2019-11-05 ENCOUNTER — Telehealth: Admit: 2019-11-05 | Discharge: 2019-11-06 | Payer: MEDICARE | Attending: Rheumatology | Primary: Rheumatology

## 2019-11-05 DIAGNOSIS — M059 Rheumatoid arthritis with rheumatoid factor, unspecified: Principal | ICD-10-CM

## 2019-11-05 DIAGNOSIS — M05741 Rheumatoid arthritis with rheumatoid factor of right hand without organ or systems involvement: Principal | ICD-10-CM

## 2019-11-05 DIAGNOSIS — M0579 Rheumatoid arthritis with rheumatoid factor of multiple sites without organ or systems involvement: Principal | ICD-10-CM

## 2019-11-05 DIAGNOSIS — M05721 Rheumatoid arthritis with rheumatoid factor of right elbow without organ or systems involvement: Principal | ICD-10-CM

## 2019-11-05 DIAGNOSIS — M8589 Other specified disorders of bone density and structure, multiple sites: Principal | ICD-10-CM

## 2019-11-05 DIAGNOSIS — M05742 Rheumatoid arthritis with rheumatoid factor of left hand without organ or systems involvement: Principal | ICD-10-CM

## 2019-11-05 DIAGNOSIS — Z79899 Other long term (current) drug therapy: Principal | ICD-10-CM

## 2019-11-05 DIAGNOSIS — M05722 Rheumatoid arthritis with rheumatoid factor of left elbow without organ or systems involvement: Secondary | ICD-10-CM

## 2019-11-05 MED ORDER — BD TUBERCULIN SYRINGE 1 ML 27 X 1/2"
3 refills | 0.00000 days | Status: CP
Start: 2019-11-05 — End: ?

## 2019-11-05 MED ORDER — METHOTREXATE SODIUM 25 MG/ML INJECTION SOLUTION
SUBCUTANEOUS | 0 refills | 84.00000 days | Status: CP
Start: 2019-11-05 — End: ?

## 2019-11-05 MED ORDER — PREDNISONE 5 MG TABLET
ORAL_TABLET | 6 refills | 0 days | Status: CP
Start: 2019-11-05 — End: ?

## 2019-11-05 MED ORDER — LEUCOVORIN CALCIUM 5 MG TABLET
ORAL_TABLET | ORAL | 6 refills | 84.00000 days | Status: CP
Start: 2019-11-05 — End: ?

## 2019-11-05 MED ORDER — HUMIRA PEN CITRATE FREE 40 MG/0.4 ML
SUBCUTANEOUS | 3 refills | 84.00000 days | Status: CP
Start: 2019-11-05 — End: ?

## 2019-11-05 MED ORDER — CELECOXIB 200 MG CAPSULE
ORAL_CAPSULE | Freq: Every day | ORAL | 1 refills | 30 days | Status: CP | PRN
Start: 2019-11-05 — End: 2020-11-04

## 2019-11-10 ENCOUNTER — Ambulatory Visit: Admit: 2019-11-10 | Discharge: 2019-11-11 | Payer: MEDICARE

## 2019-11-10 DIAGNOSIS — L820 Inflamed seborrheic keratosis: Principal | ICD-10-CM

## 2019-11-10 DIAGNOSIS — L821 Other seborrheic keratosis: Principal | ICD-10-CM

## 2019-11-10 DIAGNOSIS — Z1283 Encounter for screening for malignant neoplasm of skin: Principal | ICD-10-CM

## 2019-11-20 ENCOUNTER — Ambulatory Visit: Admit: 2019-11-20 | Discharge: 2019-11-21 | Payer: MEDICARE

## 2019-11-20 ENCOUNTER — Ambulatory Visit: Admit: 2019-11-20 | Discharge: 2019-11-21 | Payer: MEDICARE | Attending: Rheumatology | Primary: Rheumatology

## 2019-11-20 DIAGNOSIS — M059 Rheumatoid arthritis with rheumatoid factor, unspecified: Principal | ICD-10-CM

## 2019-11-20 DIAGNOSIS — M05742 Rheumatoid arthritis with rheumatoid factor of left hand without organ or systems involvement: Secondary | ICD-10-CM

## 2019-11-20 DIAGNOSIS — M0579 Rheumatoid arthritis with rheumatoid factor of multiple sites without organ or systems involvement: Principal | ICD-10-CM

## 2019-11-20 DIAGNOSIS — M25461 Effusion, right knee: Secondary | ICD-10-CM

## 2019-11-20 DIAGNOSIS — Z79899 Other long term (current) drug therapy: Principal | ICD-10-CM

## 2019-11-20 DIAGNOSIS — M05741 Rheumatoid arthritis with rheumatoid factor of right hand without organ or systems involvement: Secondary | ICD-10-CM

## 2019-11-20 DIAGNOSIS — M25561 Pain in right knee: Principal | ICD-10-CM

## 2019-11-20 DIAGNOSIS — M8589 Other specified disorders of bone density and structure, multiple sites: Principal | ICD-10-CM

## 2019-12-04 MED ORDER — TRAMADOL 50 MG TABLET
ORAL_TABLET | Freq: Four times a day (QID) | ORAL | 0 refills | 30.00000 days | Status: CP | PRN
Start: 2019-12-04 — End: ?

## 2019-12-23 DIAGNOSIS — T148XXA Other injury of unspecified body region, initial encounter: Principal | ICD-10-CM

## 2020-04-01 ENCOUNTER — Ambulatory Visit: Admit: 2020-04-01 | Discharge: 2020-04-01 | Payer: MEDICARE

## 2020-04-01 ENCOUNTER — Ambulatory Visit: Admit: 2020-04-01 | Discharge: 2020-04-01 | Payer: MEDICARE | Attending: Rheumatology | Primary: Rheumatology

## 2020-04-01 DIAGNOSIS — M05721 Rheumatoid arthritis with rheumatoid factor of right elbow without organ or systems involvement: Principal | ICD-10-CM

## 2020-04-01 DIAGNOSIS — M05742 Rheumatoid arthritis with rheumatoid factor of left hand without organ or systems involvement: Principal | ICD-10-CM

## 2020-04-01 DIAGNOSIS — Z1159 Encounter for screening for other viral diseases: Principal | ICD-10-CM

## 2020-04-01 DIAGNOSIS — M059 Rheumatoid arthritis with rheumatoid factor, unspecified: Principal | ICD-10-CM

## 2020-04-01 DIAGNOSIS — M8589 Other specified disorders of bone density and structure, multiple sites: Principal | ICD-10-CM

## 2020-04-01 DIAGNOSIS — M05741 Rheumatoid arthritis with rheumatoid factor of right hand without organ or systems involvement: Principal | ICD-10-CM

## 2020-04-01 DIAGNOSIS — M05722 Rheumatoid arthritis with rheumatoid factor of left elbow without organ or systems involvement: Principal | ICD-10-CM

## 2020-04-01 DIAGNOSIS — Z79899 Other long term (current) drug therapy: Principal | ICD-10-CM

## 2020-04-01 DIAGNOSIS — Z6824 Body mass index (BMI) 24.0-24.9, adult: Principal | ICD-10-CM

## 2020-04-01 DIAGNOSIS — M0579 Rheumatoid arthritis with rheumatoid factor of multiple sites without organ or systems involvement: Principal | ICD-10-CM

## 2020-04-01 MED ORDER — LEUCOVORIN CALCIUM 5 MG TABLET
ORAL_TABLET | ORAL | 6 refills | 84.00000 days | Status: CP
Start: 2020-04-01 — End: ?

## 2020-04-01 MED ORDER — PREDNISONE 5 MG TABLET
ORAL_TABLET | Freq: Every day | ORAL | 6 refills | 0.00000 days | Status: CP
Start: 2020-04-01 — End: ?

## 2020-04-01 MED ORDER — METHOTREXATE SODIUM 25 MG/ML INJECTION SOLUTION
SUBCUTANEOUS | 0 refills | 84 days | Status: CP
Start: 2020-04-01 — End: ?

## 2020-04-01 MED ORDER — PREDNISONE 5 MG TABLET: tablet | 6 refills | 0 days | Status: AC

## 2020-04-01 MED ORDER — HUMIRA PEN CITRATE FREE 40 MG/0.4 ML
SUBCUTANEOUS | 3 refills | 84.00000 days | Status: CP
Start: 2020-04-01 — End: 2020-06-08

## 2020-04-01 MED ORDER — DICLOFENAC 1 % TOPICAL GEL
Freq: Four times a day (QID) | TOPICAL | 0 refills | 13.00000 days | Status: CP
Start: 2020-04-01 — End: ?

## 2020-04-01 MED ORDER — BD TUBERCULIN SYRINGE 1 ML 27 X 1/2"
3 refills | 0 days | Status: CP
Start: 2020-04-01 — End: ?

## 2020-04-05 DIAGNOSIS — M0579 Rheumatoid arthritis with rheumatoid factor of multiple sites without organ or systems involvement: Principal | ICD-10-CM

## 2020-06-08 DIAGNOSIS — M0579 Rheumatoid arthritis with rheumatoid factor of multiple sites without organ or systems involvement: Principal | ICD-10-CM

## 2020-06-08 DIAGNOSIS — M05722 Rheumatoid arthritis with rheumatoid factor of left elbow without organ or systems involvement: Principal | ICD-10-CM

## 2020-06-08 DIAGNOSIS — M05721 Rheumatoid arthritis with rheumatoid factor of right elbow without organ or systems involvement: Principal | ICD-10-CM

## 2020-06-08 MED ORDER — HUMIRA PEN CITRATE FREE 40 MG/0.4 ML
SUBCUTANEOUS | 3 refills | 84 days | Status: CP
Start: 2020-06-08 — End: 2020-06-08

## 2020-06-22 DIAGNOSIS — M059 Rheumatoid arthritis with rheumatoid factor, unspecified: Principal | ICD-10-CM

## 2020-06-22 DIAGNOSIS — M0579 Rheumatoid arthritis with rheumatoid factor of multiple sites without organ or systems involvement: Principal | ICD-10-CM

## 2020-06-22 DIAGNOSIS — M05742 Rheumatoid arthritis with rheumatoid factor of left hand without organ or systems involvement: Principal | ICD-10-CM

## 2020-06-22 DIAGNOSIS — M8589 Other specified disorders of bone density and structure, multiple sites: Principal | ICD-10-CM

## 2020-06-22 DIAGNOSIS — M05741 Rheumatoid arthritis with rheumatoid factor of right hand without organ or systems involvement: Principal | ICD-10-CM

## 2020-06-22 MED ORDER — METHOTREXATE SODIUM 25 MG/ML INJECTION SOLUTION
SUBCUTANEOUS | 0 refills | 84 days | Status: CP
Start: 2020-06-22 — End: ?

## 2020-07-15 DIAGNOSIS — M25461 Effusion, right knee: Principal | ICD-10-CM

## 2020-07-15 DIAGNOSIS — M059 Rheumatoid arthritis with rheumatoid factor, unspecified: Principal | ICD-10-CM

## 2020-07-15 DIAGNOSIS — M0579 Rheumatoid arthritis with rheumatoid factor of multiple sites without organ or systems involvement: Principal | ICD-10-CM

## 2020-07-15 DIAGNOSIS — M05741 Rheumatoid arthritis with rheumatoid factor of right hand without organ or systems involvement: Principal | ICD-10-CM

## 2020-07-15 DIAGNOSIS — M05742 Rheumatoid arthritis with rheumatoid factor of left hand without organ or systems involvement: Principal | ICD-10-CM

## 2020-07-15 DIAGNOSIS — M25561 Pain in right knee: Principal | ICD-10-CM

## 2020-07-15 MED ORDER — TRAMADOL 50 MG TABLET
ORAL_TABLET | Freq: Four times a day (QID) | ORAL | 0 refills | 30 days | Status: CP | PRN
Start: 2020-07-15 — End: ?

## 2020-09-25 DIAGNOSIS — M059 Rheumatoid arthritis with rheumatoid factor, unspecified: Principal | ICD-10-CM

## 2020-09-25 DIAGNOSIS — M8589 Other specified disorders of bone density and structure, multiple sites: Principal | ICD-10-CM

## 2020-09-25 DIAGNOSIS — M0579 Rheumatoid arthritis with rheumatoid factor of multiple sites without organ or systems involvement: Principal | ICD-10-CM

## 2020-09-25 DIAGNOSIS — M05742 Rheumatoid arthritis with rheumatoid factor of left hand without organ or systems involvement: Principal | ICD-10-CM

## 2020-09-25 DIAGNOSIS — M05741 Rheumatoid arthritis with rheumatoid factor of right hand without organ or systems involvement: Principal | ICD-10-CM

## 2020-09-25 MED ORDER — METHOTREXATE SODIUM 25 MG/ML INJECTION SOLUTION
SUBCUTANEOUS | 0 refills | 0.00000 days
Start: 2020-09-25 — End: ?

## 2020-11-10 ENCOUNTER — Ambulatory Visit: Admit: 2020-11-10 | Discharge: 2020-11-11 | Payer: MEDICARE | Attending: Rheumatology | Primary: Rheumatology

## 2020-11-10 DIAGNOSIS — M05741 Rheumatoid arthritis with rheumatoid factor of right hand without organ or systems involvement: Principal | ICD-10-CM

## 2020-11-10 DIAGNOSIS — M25561 Pain in right knee: Principal | ICD-10-CM

## 2020-11-10 DIAGNOSIS — M069 Rheumatoid arthritis, unspecified: Principal | ICD-10-CM

## 2020-11-10 DIAGNOSIS — M0579 Rheumatoid arthritis with rheumatoid factor of multiple sites without organ or systems involvement: Principal | ICD-10-CM

## 2020-11-10 DIAGNOSIS — M059 Rheumatoid arthritis with rheumatoid factor, unspecified: Principal | ICD-10-CM

## 2020-11-10 DIAGNOSIS — M05721 Rheumatoid arthritis with rheumatoid factor of right elbow without organ or systems involvement: Principal | ICD-10-CM

## 2020-11-10 DIAGNOSIS — M174 Other bilateral secondary osteoarthritis of knee: Principal | ICD-10-CM

## 2020-11-10 DIAGNOSIS — M05742 Rheumatoid arthritis with rheumatoid factor of left hand without organ or systems involvement: Principal | ICD-10-CM

## 2020-11-10 DIAGNOSIS — M25461 Effusion, right knee: Principal | ICD-10-CM

## 2020-11-10 DIAGNOSIS — M810 Age-related osteoporosis without current pathological fracture: Principal | ICD-10-CM

## 2020-11-10 DIAGNOSIS — M05722 Rheumatoid arthritis with rheumatoid factor of left elbow without organ or systems involvement: Principal | ICD-10-CM

## 2020-11-10 DIAGNOSIS — M8589 Other specified disorders of bone density and structure, multiple sites: Principal | ICD-10-CM

## 2020-11-10 DIAGNOSIS — Z79899 Other long term (current) drug therapy: Principal | ICD-10-CM

## 2020-11-10 MED ORDER — LEUCOVORIN CALCIUM 5 MG TABLET
ORAL_TABLET | ORAL | 6 refills | 84 days | Status: CP
Start: 2020-11-10 — End: ?

## 2020-11-10 MED ORDER — METHOTREXATE SODIUM 25 MG/ML INJECTION SOLUTION
SUBCUTANEOUS | 0 refills | 84.00000 days | Status: CP
Start: 2020-11-10 — End: ?

## 2020-11-10 MED ORDER — PREDNISONE 5 MG TABLET
ORAL_TABLET | Freq: Every day | ORAL | 6 refills | 30 days | Status: CP
Start: 2020-11-10 — End: ?

## 2020-11-10 MED ORDER — HUMIRA PEN CITRATE FREE 40 MG/0.4 ML
SUBCUTANEOUS | 3 refills | 84.00000 days | Status: CP
Start: 2020-11-10 — End: ?

## 2020-11-10 MED ORDER — BD TUBERCULIN SYRINGE 1 ML 27 X 1/2"
3 refills | 0.00000 days | Status: CP
Start: 2020-11-10 — End: ?

## 2021-01-17 ENCOUNTER — Ambulatory Visit: Admit: 2021-01-17 | Discharge: 2021-01-18 | Payer: MEDICARE

## 2021-01-17 LAB — HM DEXA SCAN

## 2021-03-21 DIAGNOSIS — M059 Rheumatoid arthritis with rheumatoid factor, unspecified: Principal | ICD-10-CM

## 2021-03-21 DIAGNOSIS — M05741 Rheumatoid arthritis with rheumatoid factor of right hand without organ or systems involvement: Principal | ICD-10-CM

## 2021-03-21 DIAGNOSIS — M0579 Rheumatoid arthritis with rheumatoid factor of multiple sites without organ or systems involvement: Principal | ICD-10-CM

## 2021-03-21 DIAGNOSIS — M05742 Rheumatoid arthritis with rheumatoid factor of left hand without organ or systems involvement: Principal | ICD-10-CM

## 2021-03-21 DIAGNOSIS — M8589 Other specified disorders of bone density and structure, multiple sites: Principal | ICD-10-CM

## 2021-03-21 MED ORDER — METHOTREXATE SODIUM 25 MG/ML INJECTION SOLUTION
SUBCUTANEOUS | 0 refills | 84 days | Status: CP
Start: 2021-03-21 — End: ?

## 2021-04-10 DIAGNOSIS — M05722 Rheumatoid arthritis with rheumatoid factor of left elbow without organ or systems involvement: Principal | ICD-10-CM

## 2021-04-10 DIAGNOSIS — M05721 Rheumatoid arthritis with rheumatoid factor of right elbow without organ or systems involvement: Principal | ICD-10-CM

## 2021-04-10 MED ORDER — HUMIRA PEN CITRATE FREE 40 MG/0.4 ML
SUBCUTANEOUS | 3 refills | 84 days | Status: CP
Start: 2021-04-10 — End: ?

## 2021-04-11 DIAGNOSIS — M05722 Rheumatoid arthritis with rheumatoid factor of left elbow without organ or systems involvement: Principal | ICD-10-CM

## 2021-04-11 DIAGNOSIS — M05721 Rheumatoid arthritis with rheumatoid factor of right elbow without organ or systems involvement: Principal | ICD-10-CM

## 2021-06-11 ENCOUNTER — Other Ambulatory Visit: Payer: Self-pay

## 2021-06-11 ENCOUNTER — Encounter (HOSPITAL_COMMUNITY): Payer: Self-pay | Admitting: Emergency Medicine

## 2021-06-11 ENCOUNTER — Emergency Department (HOSPITAL_COMMUNITY): Payer: Medicare Other

## 2021-06-11 ENCOUNTER — Inpatient Hospital Stay (HOSPITAL_COMMUNITY)
Admission: EM | Admit: 2021-06-11 | Discharge: 2021-06-22 | DRG: 208 | Disposition: A | Discharge status: Home Health | Payer: Medicare Other | Attending: Internal Medicine | Admitting: Internal Medicine

## 2021-06-11 ENCOUNTER — Inpatient Hospital Stay (HOSPITAL_COMMUNITY): Payer: Medicare Other

## 2021-06-11 DIAGNOSIS — I2510 Atherosclerotic heart disease of native coronary artery without angina pectoris: Secondary | ICD-10-CM | POA: Diagnosis present

## 2021-06-11 DIAGNOSIS — J189 Pneumonia, unspecified organism: Secondary | ICD-10-CM | POA: Diagnosis present

## 2021-06-11 DIAGNOSIS — Z9889 Other specified postprocedural states: Secondary | ICD-10-CM

## 2021-06-11 DIAGNOSIS — I5043 Acute on chronic combined systolic (congestive) and diastolic (congestive) heart failure: Secondary | ICD-10-CM | POA: Diagnosis not present

## 2021-06-11 DIAGNOSIS — Z4659 Encounter for fitting and adjustment of other gastrointestinal appliance and device: Secondary | ICD-10-CM

## 2021-06-11 DIAGNOSIS — J9601 Acute respiratory failure with hypoxia: Secondary | ICD-10-CM | POA: Diagnosis not present

## 2021-06-11 DIAGNOSIS — I110 Hypertensive heart disease with heart failure: Secondary | ICD-10-CM | POA: Diagnosis present

## 2021-06-11 DIAGNOSIS — R64 Cachexia: Secondary | ICD-10-CM | POA: Diagnosis present

## 2021-06-11 DIAGNOSIS — Z79631 Long term (current) use of antimetabolite agent: Secondary | ICD-10-CM | POA: Diagnosis not present

## 2021-06-11 DIAGNOSIS — M069 Rheumatoid arthritis, unspecified: Secondary | ICD-10-CM | POA: Diagnosis present

## 2021-06-11 DIAGNOSIS — Z79899 Other long term (current) drug therapy: Secondary | ICD-10-CM

## 2021-06-11 DIAGNOSIS — J181 Lobar pneumonia, unspecified organism: Secondary | ICD-10-CM | POA: Diagnosis not present

## 2021-06-11 DIAGNOSIS — I420 Dilated cardiomyopathy: Secondary | ICD-10-CM | POA: Diagnosis present

## 2021-06-11 DIAGNOSIS — D849 Immunodeficiency, unspecified: Secondary | ICD-10-CM | POA: Diagnosis not present

## 2021-06-11 DIAGNOSIS — I214 Non-ST elevation (NSTEMI) myocardial infarction: Secondary | ICD-10-CM

## 2021-06-11 DIAGNOSIS — F1010 Alcohol abuse, uncomplicated: Secondary | ICD-10-CM | POA: Diagnosis present

## 2021-06-11 DIAGNOSIS — Z20822 Contact with and (suspected) exposure to covid-19: Secondary | ICD-10-CM | POA: Diagnosis present

## 2021-06-11 DIAGNOSIS — D649 Anemia, unspecified: Secondary | ICD-10-CM | POA: Diagnosis present

## 2021-06-11 DIAGNOSIS — I2722 Pulmonary hypertension due to left heart disease: Secondary | ICD-10-CM | POA: Diagnosis present

## 2021-06-11 DIAGNOSIS — Z7952 Long term (current) use of systemic steroids: Secondary | ICD-10-CM

## 2021-06-11 DIAGNOSIS — E440 Moderate protein-calorie malnutrition: Secondary | ICD-10-CM | POA: Diagnosis present

## 2021-06-11 DIAGNOSIS — R739 Hyperglycemia, unspecified: Secondary | ICD-10-CM | POA: Diagnosis not present

## 2021-06-11 DIAGNOSIS — Z9911 Dependence on respirator [ventilator] status: Secondary | ICD-10-CM | POA: Diagnosis not present

## 2021-06-11 DIAGNOSIS — R079 Chest pain, unspecified: Secondary | ICD-10-CM | POA: Insufficient documentation

## 2021-06-11 DIAGNOSIS — I5023 Acute on chronic systolic (congestive) heart failure: Secondary | ICD-10-CM | POA: Diagnosis not present

## 2021-06-11 DIAGNOSIS — I255 Ischemic cardiomyopathy: Secondary | ICD-10-CM | POA: Diagnosis present

## 2021-06-11 DIAGNOSIS — I5021 Acute systolic (congestive) heart failure: Secondary | ICD-10-CM | POA: Diagnosis not present

## 2021-06-11 DIAGNOSIS — D84821 Immunodeficiency due to drugs: Secondary | ICD-10-CM | POA: Diagnosis present

## 2021-06-11 DIAGNOSIS — R7989 Other specified abnormal findings of blood chemistry: Secondary | ICD-10-CM | POA: Diagnosis not present

## 2021-06-11 DIAGNOSIS — J9602 Acute respiratory failure with hypercapnia: Secondary | ICD-10-CM | POA: Diagnosis not present

## 2021-06-11 DIAGNOSIS — J80 Acute respiratory distress syndrome: Secondary | ICD-10-CM | POA: Diagnosis present

## 2021-06-11 DIAGNOSIS — E785 Hyperlipidemia, unspecified: Secondary | ICD-10-CM | POA: Diagnosis present

## 2021-06-11 DIAGNOSIS — I472 Ventricular tachycardia, unspecified: Secondary | ICD-10-CM | POA: Diagnosis not present

## 2021-06-11 DIAGNOSIS — R579 Shock, unspecified: Secondary | ICD-10-CM | POA: Diagnosis not present

## 2021-06-11 DIAGNOSIS — J9600 Acute respiratory failure, unspecified whether with hypoxia or hypercapnia: Secondary | ICD-10-CM | POA: Diagnosis present

## 2021-06-11 DIAGNOSIS — Z86711 Personal history of pulmonary embolism: Secondary | ICD-10-CM

## 2021-06-11 DIAGNOSIS — Z8616 Personal history of COVID-19: Secondary | ICD-10-CM | POA: Diagnosis not present

## 2021-06-11 DIAGNOSIS — R0602 Shortness of breath: Secondary | ICD-10-CM

## 2021-06-11 DIAGNOSIS — Z86718 Personal history of other venous thrombosis and embolism: Secondary | ICD-10-CM

## 2021-06-11 DIAGNOSIS — I21A1 Myocardial infarction type 2: Secondary | ICD-10-CM | POA: Diagnosis present

## 2021-06-11 DIAGNOSIS — Z789 Other specified health status: Secondary | ICD-10-CM

## 2021-06-11 DIAGNOSIS — R0600 Dyspnea, unspecified: Secondary | ICD-10-CM

## 2021-06-11 DIAGNOSIS — Z6823 Body mass index (BMI) 23.0-23.9, adult: Secondary | ICD-10-CM

## 2021-06-11 HISTORY — DX: Rheumatoid arthritis, unspecified: M06.9

## 2021-06-11 LAB — CBC
HCT: 31.2 % — ABNORMAL LOW (ref 36.0–46.0)
Hemoglobin: 10.8 g/dL — ABNORMAL LOW (ref 12.0–15.0)
MCH: 31.8 pg (ref 26.0–34.0)
MCHC: 34.6 g/dL (ref 30.0–36.0)
MCV: 91.8 fL (ref 80.0–100.0)
Platelets: 366 10*3/uL (ref 150–400)
RBC: 3.4 MIL/uL — ABNORMAL LOW (ref 3.87–5.11)
RDW: 13.4 % (ref 11.5–15.5)
WBC: 9.8 10*3/uL (ref 4.0–10.5)
nRBC: 0 % (ref 0.0–0.2)

## 2021-06-11 LAB — RESP PANEL BY RT-PCR (FLU A&B, COVID) ARPGX2
Influenza A by PCR: NEGATIVE
Influenza B by PCR: NEGATIVE
SARS Coronavirus 2 by RT PCR: NEGATIVE

## 2021-06-11 LAB — BASIC METABOLIC PANEL
Anion gap: 12 (ref 5–15)
BUN: 8 mg/dL (ref 8–23)
CO2: 21 mmol/L — ABNORMAL LOW (ref 22–32)
Calcium: 8.3 mg/dL — ABNORMAL LOW (ref 8.9–10.3)
Chloride: 102 mmol/L (ref 98–111)
Creatinine, Ser: 0.77 mg/dL (ref 0.44–1.00)
GFR, Estimated: >60 mL/min (ref >60)
Glucose, Bld: 113 mg/dL — ABNORMAL HIGH (ref 70–99)
Potassium: 3.5 mmol/L (ref 3.5–5.1)
Sodium: 135 mmol/L (ref 135–145)

## 2021-06-11 LAB — TROPONIN I (HIGH SENSITIVITY)
Troponin I (High Sensitivity): 25 ng/L — ABNORMAL HIGH (ref <18)
Troponin I (High Sensitivity): 19 ng/L — ABNORMAL HIGH (ref <18)

## 2021-06-11 LAB — LACTIC ACID, PLASMA: Lactic Acid, Venous: 0.9 mmol/L (ref 0.5–1.9)

## 2021-06-11 LAB — APTT: aPTT: 32 seconds (ref 24–36)

## 2021-06-11 LAB — PROTIME-INR
INR: 1.1 (ref 0.8–1.2)
Prothrombin Time: 14.5 seconds (ref 11.4–15.2)

## 2021-06-11 MED ORDER — ACETAMINOPHEN-CODEINE #3 300-30 MG PO TABS
2.0000 | ORAL_TABLET | Freq: Once | ORAL | Status: AC
  Administered 2021-06-12: 2 via ORAL
  Filled 2021-06-11: qty 2

## 2021-06-11 MED ORDER — IOHEXOL 350 MG/ML SOLN
80.0000 mL | Freq: Once | INTRAVENOUS | Status: AC | PRN
  Administered 2021-06-11: 80 mL via INTRAVENOUS

## 2021-06-11 MED ORDER — LACTATED RINGERS IV SOLN: INTRAVENOUS | Status: DC

## 2021-06-11 MED ORDER — SODIUM CHLORIDE 0.9 % IV BOLUS (SEPSIS)
1000.0000 mL | Freq: Once | INTRAVENOUS | Status: AC
  Administered 2021-06-11: 1000 mL via INTRAVENOUS

## 2021-06-11 MED ORDER — SODIUM CHLORIDE 0.9 % IV SOLN
500.0000 mg | INTRAVENOUS | Status: AC
  Administered 2021-06-11 – 2021-06-15 (×5): 500 mg via INTRAVENOUS
  Filled 2021-06-11 (×5): qty 5

## 2021-06-11 MED ORDER — SODIUM CHLORIDE 0.9 % IV SOLN
2.0000 g | INTRAVENOUS | Status: DC
  Administered 2021-06-11: 2 g via INTRAVENOUS
  Filled 2021-06-11: qty 20

## 2021-06-11 NOTE — ED Triage Notes (Signed)
C/o non-productive cough, chest pain, and SOB x 1 month.

## 2021-06-11 NOTE — Assessment & Plan Note (Deleted)
Due to multifocal pneumonia> COVID-2 negative.  No PE on CTA chest. Pt now with severe respiratory distress and failure. She will require urgent intubation.

## 2021-06-11 NOTE — ED Notes (Signed)
Patient transported to CT 

## 2021-06-11 NOTE — Progress Notes (Signed)
Elink following code sepsis °

## 2021-06-11 NOTE — H&P (Signed)
History and Physical    Patient: Veronica Romero [id number] DOB: [birth year] DOA: 06/11/2021 DOS: the patient was seen and examined on 06/11/2021 PCP: Pcp, No  Patient coming from: Home  Chief Complaint:  Chief Complaint  Patient presents with   Cough   Chest Pain   Shortness of Breath    HPI: Veronica Romero is a 71 y.o. female with medical history significant of RA who presents for evaluation of dyspnea.  She reports a 4-day history of increasing shortness of breath with cough that is nonproductive.  She has had chest pain for the last 2 days.  Reports shortness of breath is worse with any type of exertion.  Reports the chest pain as a substernal pressure that does not radiate and not associated with diaphoresis nausea or vomiting.  Pain is worsened with deep inspiration.  She gets short of breath with talking which is not normal for her.  She did have a fever to 102 degrees today.  She has taken over-the-counter cough and cold medication and Tylenol with only minimal improvement.  Daughter reports that she Veronica Romero is always very active and when she went to her house today and found her to be in bed she knew that she was not feeling well and brought her to the hospital when she noted her shortness of breath.  She has not had any recent travel.  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 Both patient and her daughter report that she had COVID of 3 to 4 weeks ago but symptoms had completely resolved before this episode of shortness of breath and cough developed.  In the emergency room Veronica Romero has required oxygen by nasal cannula to maintain O2 sat above 93%.  Hemodynamically stable.  Found to have multifocal pneumonia on chest x-ray and CT chest without contrast.  Started on antibiotic therapy.  Hospitalist service asked to admit for further management  Review of Systems: As mentioned in the history of present illness. All other systems  reviewed and are negative. Past Medical History:  Diagnosis Date   Rheumatoid arthritis ([place])    History reviewed. No pertinent surgical history. Social History:  reports that she has never smoked. She has never used smokeless tobacco. She reports that she does not currently use alcohol. She reports that she does not currently use drugs.  No Known Allergies  History reviewed. No pertinent family history.  Prior to Admission medications   Not on File    Physical Exam: Vitals:   06/11/21 1805 06/11/21 1900 06/11/21 1930 06/11/21 1945  BP: (!) 133/109 112/63 (!) 128/52 (!) 123/56  Pulse: 85 73 80 74  Resp: (!) 22 (!) 23 (!) 21 19  Temp:      TempSrc:      SpO2: 93% 98% 96% 97%   General: WDWN, Alert and oriented x3.  Eyes: EOMI, PERRL, conjunctivae normal.  Sclera nonicteric HENT:  [place]/AT, external ears normal.  Nares patent without epistasis.  Mucous membranes are moist. Posterior pharynx clear of any exudate Neck: Soft, normal range of motion, supple, no masses, no thyromegaly.  Trachea midline Respiratory: Equal breath sounds, diffuse rales and rhonchi. no wheezing. Normal respiratory effort at rest. Cardiovascular: Regular rate and rhythm, no murmurs / rubs / gallops. No extremity edema. 2+ pedal pulses.  Abdomen: Soft, no tenderness, nondistended, no rebound or guarding.  No masses palpated. Bowel sounds normoactive Musculoskeletal: FROM. Bilateral thickening of joints in  hands and fingers with ulnar deviation consistent with RA.  no cyanosis. Normal muscle tone.  Skin: Warm, dry, intact no rashes, lesions, ulcers. No induration Neurologic: CN 2-12 grossly intact.  Normal speech.  Sensation intact to touch  Psychiatric: Normal judgment and insight.  Normal mood.    Data Reviewed: Lab work reveals WBC 9800 hemoglobin 10 point hematocrit 31.2 platelets 366,000.  INR 1.1.  Lactic acid 0.9. Sodium 135 potassium 3.5 chloride 102 bicarb 0.77 BUN 8 bicarb 21 glucose 113 initial  troponin 25.  COVID-negative.  Influenza a and B negative Chest x-ray shows extensive patchy opacities throughout both lungs.  CT of the chest without contrast shows bilateral patchy groundglass opacities consistent with multifocal pneumonia CTA of chest pending  EKG shows normal sinus rhythm with no acute ST elevation or depression.  QTc 477  Assessment and Plan: * Multifocal pneumonia- (present on admission) Veronica Romero is admitted to Medical Telemetry unit Started on Rocephin and azithromycin for antibiotic coverage.  Oxygen as needed to keep O2 sat 93-96% Incentive spirometer every 2 hours while awake.  Robitussin DM for cough as needed  Chest pain Check serial troponin levels. Monitor on telemetry   Dyspnea Due to multifocal pneumonia CTA chest pending to make sure no PE. If PE identified will start therapeutic anticoagulation  Rheumatoid Arthritis chronic   Advance Care Planning:  CODE STATUS: Full code  Lovenox for DVT prophylaxis.  If PE is identified on CTA chest we will convert to therapeutic anticoagulation  Family Communication: Diagnosis and plan discussed with patient and her daughter who is at bedside.  Both verbalized understanding agree with plan.  Questions answered.  Further recommendation" indicated  Author: Eben Burow, MD 06/11/2021 9:37 PM  For on call review [email or web address].

## 2021-06-11 NOTE — Assessment & Plan Note (Deleted)
Veronica Romero will be admitted to the ICU with mechanical ventilation for acute hypoxic and hypercapneic respiratory failure. COVID -2 negative. Influenza negative. Will check respiratory viral panel. She is receiving rocephin and azithromycin. Patient is on humira, prednisone,  and methotrexate/leukovorin for RA at home.

## 2021-06-11 NOTE — ED Provider Notes (Signed)
[facility] EMERGENCY DEPARTMENT Provider Note   CSN: [id number] Arrival date & time: 06/11/21  1636     History  Chief Complaint  Patient presents with   Cough   Chest Pain   Shortness of Breath    Veronica Romero is a 71 y.o. female.  HPI      71 year old female comes in a chief complaint of cough, chest pain and shortness of breath.  Patient here with her daughter.  Patient has history of rheumatoid arthritis and is on medications for it.  She does not have any underlying lung disease or cardiac disease.  Patient indicates that she has been having a cough for the last few days.  More recently she has been having increasing shortness of breath, chest pain.  Patient's cough is mostly nonproductive.  She is having fevers, there was a temp of 102 prior to ED arrival.  Patient did take Tylenol prior to ED arrival.  Daughter indicates that patient appears short of breath even with talking, which is unusual for her. Patient indicates pain worse with deep inspiration.  Home Medications Prior to Admission medications   Medication Sig Start Date End Date Taking? Authorizing Provider  Adalimumab (HUMIRA PEN) 40 MG/0.4ML PNKT Inject 0.4 mLs into the skin once a week. Tuesday 04/10/21  Yes [provider]  leucovorin (WELLCOVORIN) 5 MG tablet Take 15 mg by mouth once a week. Saturdays 04/07/21  Yes [provider]  methotrexate 50 MG/2ML injection Inject 50 mg/m2 into the muscle once a week. Saturdays 03/21/21  Yes [provider]  predniSONE (DELTASONE) 5 MG tablet Take 5 mg by mouth daily. 04/12/21  Yes [provider]      Allergies    Penicillins    Review of Systems   Review of Systems  Constitutional:  Positive for activity change.  Respiratory:  Positive for shortness of breath.   All other systems reviewed and are negative.  Physical Exam Updated Vital Signs BP (!) 123/56    Pulse 74    Temp 98.5 F (36.9 C) (Oral)     Resp 19    SpO2 97%  Physical Exam Vitals and nursing note reviewed.  Constitutional:      Appearance: She is well-developed.  HENT:     Head: Atraumatic.  Cardiovascular:     Rate and Rhythm: Normal rate.  Pulmonary:     Effort: Pulmonary effort is normal. Tachypnea present.     Breath sounds: Examination of the right-upper field reveals rales. Examination of the left-upper field reveals rales. Examination of the right-middle field reveals rales. Examination of the left-middle field reveals rales. Examination of the right-lower field reveals rales. Examination of the left-lower field reveals rales. Rales present.  Musculoskeletal:     Cervical back: Normal range of motion and neck supple.  Skin:    General: Skin is warm and dry.  Neurological:     Mental Status: She is alert and oriented to person, place, and time.    ED Results / Procedures / Treatments   Labs (all labs ordered are listed, but only abnormal results are displayed) Labs Reviewed  BASIC METABOLIC PANEL - Abnormal; Notable for the following components:      Result Value   CO2 21 (*)    Glucose, Bld 113 (*)    Calcium 8.3 (*)    All other components within normal limits  CBC - Abnormal; Notable for the following components:   RBC 3.40 (*)  Hemoglobin 10.8 (*)    HCT 31.2 (*)    All other components within normal limits  TROPONIN I (HIGH SENSITIVITY) - Abnormal; Notable for the following components:   Troponin I (High Sensitivity) 25 (*)    All other components within normal limits  TROPONIN I (HIGH SENSITIVITY) - Abnormal; Notable for the following components:   Troponin I (High Sensitivity) 19 (*)    All other components within normal limits  RESP PANEL BY RT-PCR (FLU A&B, COVID) ARPGX2  CULTURE, BLOOD (ROUTINE X 2)  CULTURE, BLOOD (ROUTINE X 2)  LACTIC ACID, PLASMA  PROTIME-INR  APTT  LACTIC ACID, PLASMA  URINALYSIS, ROUTINE W REFLEX MICROSCOPIC    EKG EKG Interpretation  Date/Time:  Sunday  June 11 2021 16:44:12 EST Ventricular Rate:  92 PR Interval:  142 QRS Duration: 86 QT Interval:  386 QTC Calculation: 477 R Axis:   21 Text Interpretation: Possible Lateral infarct , age undetermined Possible Inferior infarct , age undetermined Abnormal ECG No previous ECGs available Confirmed by Varney Biles [phone]) on 06/11/2021 5:41:53 PM  Radiology DG Chest 2 View  Result Date: 06/11/2021 CLINICAL DATA:  Chest pain, cough, congestion EXAM: CHEST - 2 VIEW COMPARISON:  None. FINDINGS: Heart size within normal limits. Extensive patchy airspace opacities throughout both lungs. No pleural effusion. No pneumothorax. Exaggerated thoracic kyphosis. IMPRESSION: Extensive patchy airspace opacities throughout both lungs, suspicious for multifocal pneumonia. Electronically Signed   By: Davina Poke D.O.   On: 06/11/2021 17:39   CT CHEST WO CONTRAST  Result Date: 06/11/2021 CLINICAL DATA:  Recent COVID-19 infection. Current chest radiograph demonstrating airspace lung opacities suspicious for multifocal pneumonia. EXAM: CT CHEST WITHOUT CONTRAST TECHNIQUE: Multidetector CT imaging of the chest was performed following the standard protocol without IV contrast. RADIATION DOSE REDUCTION: This exam was performed according to the departmental dose-optimization program which includes automated exposure control, adjustment of the mA and/or kV according to patient size and/or use of iterative reconstruction technique. COMPARISON:  Current chest radiograph. FINDINGS: Cardiovascular: Heart normal in size. No pericardial effusion. Left and circumflex coronary artery calcifications. Main pulmonary artery dilated to 3.7 cm. Aorta normal in caliber. Mild aortic atherosclerotic calcifications. Mediastinum/Nodes: Moderate hiatal hernia. Esophagus is unremarkable. Subcentimeter shoddy mediastinal lymph nodes. No enlarged nodes. No mediastinal or hilar masses. Trachea is unremarkable. Lungs/Pleura: Patchy bilateral  ground-glass lung opacities consistent with multifocal pneumonia, atypical etiologies including COVID-19 infection in the differential diagnosis. No evidence of pulmonary edema. No pleural effusion or pneumothorax. Upper Abdomen: No acute abnormality. Musculoskeletal: Moderate compression fracture of T11. Mild compression fracture of T12. These are of unclear chronicity, but appear chronic. No other fractures. No bone lesions. IMPRESSION: 1. Numerous bilateral patchy areas of ground-glass opacity consistent with multifocal pneumonia. 2. Mild dilation of the main pulmonary artery suggesting pulmonary artery hypertension. 3. Aortic atherosclerosis. 4. Compression fractures of T11 and T12, most likely chronic. 5. Coronary artery calcifications. Aortic Atherosclerosis (ICD10-I70.0). Electronically Signed   By: Lajean Manes M.D.   On: 06/11/2021 20:31   CT Angio Chest PE W/Cm &/Or Wo Cm  Result Date: 06/11/2021 CLINICAL DATA:  Chest pain and shortness of breath. EXAM: CT ANGIOGRAPHY CHEST WITH CONTRAST TECHNIQUE: Multidetector CT imaging of the chest was performed using the standard protocol during bolus administration of intravenous contrast. Multiplanar CT image reconstructions and MIPs were obtained to evaluate the vascular anatomy. RADIATION DOSE REDUCTION: This exam was performed according to the departmental dose-optimization program which includes automated exposure control, adjustment of the mA and/or kV according  to patient size and/or use of iterative reconstruction technique. CONTRAST:  54mL OMNIPAQUE IOHEXOL 350 MG/ML SOLN COMPARISON:  PA and lateral chest and noncontrast chest CT earlier today are the only chest priors FINDINGS: Cardiovascular: The heart is slightly enlarged. There is no pericardial effusion. There are calcifications in the left main, lad and circumflex coronary arteries. The aorta is normal in caliber with mild descending tortuosity and with mild patchy calcific atherosclerosis. No  dissection or aneurysm is seen. There are nonstenosing calcifications in the left subclavian artery. The great vessels otherwise unremarkable. Prominent pulmonary trunk again noted measuring 3.4 cm indicating arterial hypertension, but there is no right heart chamber expansion, IVC reflux or appreciable arterial embolic filling defect. Central pulmonary veins are slightly distended. Mediastinum/Nodes: Visualized thyroid gland is unremarkable. Axillary spaces are clear. A moderate-sized hiatal hernia is again seen with unremarkable thoracic esophagus. There are mildly prominent scattered right hilar lymph nodes up to 1.3 cm in short axis, few mildly prominent right hilar nodes up to 1.2 cm in short axis, and subcarinal mediastinal nodes also up to 1.2 cm in short axis. There is no further adenopathy.  The trachea is clear. Lungs/Pleura: Again noted are extensive bilateral interstitial and ground-glass infiltrates, widespread throughout both lungs, with a central and peripheral predominance, most likely indicating extensive pneumonia. There are trace pleural effusions. No pneumothorax or pneumomediastinum. Upper Abdomen: No acute abnormality. Mild eventration and elevation right hemidiaphragm. Calcified granulomas in the spleen. Splenic artery is heavily calcified. Musculoskeletal: Chronic appearing compression fractures of the T11 and T12 vertebral bodies are again noted with exaggerated thoracic kyphosis, mild osteopenia, and degenerative change of the spine. Hemangiomatous replacement of much of the T8 vertebral body is incidentally seen. There is a chronic trauma deformity of the proximal body of the sternum. The ribcage is intact. Review of the MIP images confirms the above findings. IMPRESSION: 1. Prominent pulmonary trunk without evidence of arterial emboli or acute right heart strain. 2. Extensive interstitial and ground-glass infiltrates throughout the lungs, predominating in the center and periphery  compatible with multifocal pneumonia including viral and atypical etiologies such as COVID pneumonia. Has the patient been tested? Underlying edema component possible but believed unlikely. There are trace pleural effusions. 3. Mildly prominent hilar and mediastinal lymph nodes. Follow-up study recommended after treatment or PET-CT. 4. Slightly prominent heart and central pulmonary veins, with aortic and coronary artery atherosclerosis. 5. Moderate-sized hiatal hernia. 6. Chronic appearing compression fractures of the T11 and T12 vertebral bodies. Electronically Signed   By: Telford Nab M.D.   On: 06/11/2021 22:03    Procedures .Critical Care Performed by: Varney Biles, MD Authorized by: Varney Biles, MD   Critical care provider statement:    Critical care time (minutes):  33   Critical care was time spent personally by me on the following activities:  Development of treatment plan with patient or surrogate, discussions with consultants, evaluation of patient's response to treatment, examination of patient, ordering and review of laboratory studies, ordering and review of radiographic studies, ordering and performing treatments and interventions, pulse oximetry, re-evaluation of patient's condition and review of old charts    Medications Ordered in ED Medications  lactated ringers infusion ( Intravenous New Bag/Given 06/11/21 2229)  cefTRIAXone (ROCEPHIN) 2 g in sodium chloride 0.9 % 100 mL IVPB (0 g Intravenous Stopped 06/11/21 1905)  azithromycin (ZITHROMAX) 500 mg in sodium chloride 0.9 % 250 mL IVPB (0 mg Intravenous Stopped 06/11/21 2116)  acetaminophen-codeine (TYLENOL #3) 300-30 MG per tablet 2  tablet (has no administration in time range)  sodium chloride 0.9 % bolus 1,000 mL (0 mLs Intravenous Stopped 06/11/21 2003)  iohexol (OMNIPAQUE) 350 MG/ML injection 80 mL (80 mLs Intravenous Contrast Given 06/11/21 2132)    ED Course/ Medical Decision Making/ A&P Clinical Course as of  06/12/21 0011  Mon Jun 12, 2021  0010 At the time of admission, medicine service requested that we get CT PE as patient did have a history of PE remotely several years back.  CT PE ordered.  Admitting team will follow up on the results. [AN]    Clinical Course User Index [AN] Varney Biles, MD                           Medical Decision Making Amount and/or Complexity of Data Reviewed Labs: ordered. Radiology: ordered. ECG/medicine tests: ordered.  Risk Prescription drug management. Decision regarding hospitalization.   This patient presents to the ED with chief complaint(s) of chest pain, shortness of breath, cough, fevers with pertinent past medical history of recent bout with COVID-19, rheumatoid arthritis on immunomodulators which further complicates the presenting complaint. The complaint involves an extensive differential diagnosis and treatment options and also carries with it a high risk of complications and morbidity.    The differential diagnosis includes bacterial pneumonia, pulmonary embolism, pulmonary edema, pericarditis, myocarditis, pleurisy, pleural effusion, rule out COVID, post-COVID syndrome  The initial plan is to order initiate sepsis work-up.  CBC, CMP, lactic acid, blood cultures along with IV antibiotics ordered.  Patient noted to have hypoxia, with O2 sats dropping to 89% while she is speaking.  The O2 sats will rebounded to mid 90s easily.  Clinical suspicion is high for severe sepsis.   Additional history obtained: Additional history obtained from family Records reviewed Care Everywhere/External Records -noted that patient has rheumatoid arthritis.  Reassessment and review (also see workup area): Lab Tests: I Ordered, and personally interpreted labs.  The pertinent results include: Normal CBC, metabolic profile, lactic acid.  Troponin is mildly elevated only.  Imaging Studies: I ordered and independently visualized and interpreted the following imaging  X-ray of the chest   which shows diffuse opacity over both lung field. The interpretation of the imaging was limited to assessing for emergent pathology, for which purpose it was ordered.  Reviewed patient's chest x-ray.  Given that she had a fever -suspicion for PE is lower.  I will get a CT chest without contrast to confirm pneumonia.  Nursing staff advised that they have put patient on 2 L of oxygen because of recurrent low O2 sats.  Plan is to admit her to the hospital.  IV antibiotics covering for community-acquired pneumonia initiated.  CT-PE can be considered at admission if needed.  Our suspicion for PE is low.  Cardiac Monitoring: The patient was maintained on a cardiac monitor.  I personally viewed and interpreted the cardiac monitor which showed an underlying rhythm of:  sinus rhythm  Complexity of problems addressed: Patients presentation is most consistent with  acute presentation with potential threat to life or bodily function During patient's assessment  Disposition: After consideration of the diagnostic results and the patients response to treatment,  I feel that the patent would benefit from admission   .   Final Clinical Impression(s) / ED Diagnoses Final diagnoses:  Acute respiratory failure with hypoxia ([place])  Multifocal pneumonia    Rx / DC Orders ED Discharge Orders     None  Varney Biles, MD 06/12/21 [phone]

## 2021-06-12 ENCOUNTER — Inpatient Hospital Stay (HOSPITAL_COMMUNITY): Payer: Medicare Other

## 2021-06-12 DIAGNOSIS — J9601 Acute respiratory failure with hypoxia: Secondary | ICD-10-CM | POA: Diagnosis not present

## 2021-06-12 DIAGNOSIS — J189 Pneumonia, unspecified organism: Secondary | ICD-10-CM | POA: Diagnosis not present

## 2021-06-12 DIAGNOSIS — E440 Moderate protein-calorie malnutrition: Secondary | ICD-10-CM | POA: Insufficient documentation

## 2021-06-12 DIAGNOSIS — I255 Ischemic cardiomyopathy: Secondary | ICD-10-CM

## 2021-06-12 DIAGNOSIS — J9602 Acute respiratory failure with hypercapnia: Secondary | ICD-10-CM

## 2021-06-12 DIAGNOSIS — M069 Rheumatoid arthritis, unspecified: Secondary | ICD-10-CM | POA: Diagnosis not present

## 2021-06-12 DIAGNOSIS — J9600 Acute respiratory failure, unspecified whether with hypoxia or hypercapnia: Secondary | ICD-10-CM | POA: Insufficient documentation

## 2021-06-12 HISTORY — DX: Ischemic cardiomyopathy: I25.5

## 2021-06-12 LAB — COMPREHENSIVE METABOLIC PANEL
ALT: 35 U/L (ref 0–44)
AST: 116 U/L — ABNORMAL HIGH (ref 15–41)
Albumin: 2.8 g/dL — ABNORMAL LOW (ref 3.5–5.0)
Alkaline Phosphatase: 117 U/L (ref 38–126)
Anion gap: 11 (ref 5–15)
BUN: 8 mg/dL (ref 8–23)
CO2: 24 mmol/L (ref 22–32)
Calcium: 7.9 mg/dL — ABNORMAL LOW (ref 8.9–10.3)
Chloride: 104 mmol/L (ref 98–111)
Creatinine, Ser: 0.76 mg/dL (ref 0.44–1.00)
GFR, Estimated: >60 mL/min (ref >60)
Glucose, Bld: 172 mg/dL — ABNORMAL HIGH (ref 70–99)
Potassium: 3 mmol/L — ABNORMAL LOW (ref 3.5–5.1)
Sodium: 139 mmol/L (ref 135–145)
Total Bilirubin: 1.3 mg/dL — ABNORMAL HIGH (ref 0.3–1.2)
Total Protein: 6.7 g/dL (ref 6.5–8.1)

## 2021-06-12 LAB — POCT I-STAT 7, (LYTES, BLD GAS, ICA,H+H)
Acid-Base Excess: 3 mmol/L — ABNORMAL HIGH (ref 0.0–2.0)
Acid-base deficit: 4 mmol/L — ABNORMAL HIGH (ref 0.0–2.0)
Bicarbonate: 24.7 mmol/L (ref 20.0–28.0)
Bicarbonate: 26.6 mmol/L (ref 20.0–28.0)
Calcium, Ion: 1.21 mmol/L (ref 1.15–1.40)
Calcium, Ion: 1.13 mmol/L — ABNORMAL LOW (ref 1.15–1.40)
HCT: 32 % — ABNORMAL LOW (ref 36.0–46.0)
HCT: 27 % — ABNORMAL LOW (ref 36.0–46.0)
Hemoglobin: 10.9 g/dL — ABNORMAL LOW (ref 12.0–15.0)
Hemoglobin: 9.2 g/dL — ABNORMAL LOW (ref 12.0–15.0)
O2 Saturation: 94 %
O2 Saturation: 100 %
Patient temperature: 102.9
Patient temperature: 102.9
Potassium: 3.3 mmol/L — ABNORMAL LOW (ref 3.5–5.1)
Potassium: 3.7 mmol/L (ref 3.5–5.1)
Sodium: 137 mmol/L (ref 135–145)
Sodium: 137 mmol/L (ref 135–145)
TCO2: 27 mmol/L (ref 22–32)
TCO2: 28 mmol/L (ref 22–32)
pCO2 arterial: 72.5 mmHg (ref 32.0–48.0)
pCO2 arterial: 39 mmHg (ref 32.0–48.0)
pH, Arterial: 7.155 — CL (ref 7.350–7.450)
pH, Arterial: 7.451 — ABNORMAL HIGH (ref 7.350–7.450)
pO2, Arterial: 106 mmHg (ref 83.0–108.0)
pO2, Arterial: 360 mmHg — ABNORMAL HIGH (ref 83.0–108.0)

## 2021-06-12 LAB — CBC
HCT: 30.8 % — ABNORMAL LOW (ref 36.0–46.0)
Hemoglobin: 10.4 g/dL — ABNORMAL LOW (ref 12.0–15.0)
MCH: 31.8 pg (ref 26.0–34.0)
MCHC: 33.8 g/dL (ref 30.0–36.0)
MCV: 94.2 fL (ref 80.0–100.0)
Platelets: 392 10*3/uL (ref 150–400)
RBC: 3.27 MIL/uL — ABNORMAL LOW (ref 3.87–5.11)
RDW: 13.6 % (ref 11.5–15.5)
WBC: 23.1 10*3/uL — ABNORMAL HIGH (ref 4.0–10.5)
nRBC: 0 % (ref 0.0–0.2)

## 2021-06-12 LAB — PROCALCITONIN
Procalcitonin: <0.1 ng/mL
Procalcitonin: <0.1 ng/mL
Procalcitonin: 1.21 ng/mL

## 2021-06-12 LAB — BASIC METABOLIC PANEL
Anion gap: 10 (ref 5–15)
BUN: 10 mg/dL (ref 8–23)
CO2: 22 mmol/L (ref 22–32)
Calcium: 8 mg/dL — ABNORMAL LOW (ref 8.9–10.3)
Chloride: 105 mmol/L (ref 98–111)
Creatinine, Ser: 0.85 mg/dL (ref 0.44–1.00)
GFR, Estimated: >60 mL/min (ref >60)
Glucose, Bld: 163 mg/dL — ABNORMAL HIGH (ref 70–99)
Potassium: 3.6 mmol/L (ref 3.5–5.1)
Sodium: 137 mmol/L (ref 135–145)

## 2021-06-12 LAB — URINALYSIS, ROUTINE W REFLEX MICROSCOPIC
Bilirubin Urine: NEGATIVE
Glucose, UA: 50 mg/dL — AB
Hgb urine dipstick: NEGATIVE
Ketones, ur: 20 mg/dL — AB
Leukocytes,Ua: NEGATIVE
Nitrite: NEGATIVE
Protein, ur: 30 mg/dL — AB
Specific Gravity, Urine: 1.02 (ref 1.005–1.030)
pH: 5 (ref 5.0–8.0)

## 2021-06-12 LAB — ECHOCARDIOGRAM COMPLETE
AR max vel: 2.26 cm2
AV Peak grad: 6.3 mmHg
Ao pk vel: 1.25 m/s
Area-P 1/2: 6.17 cm2
Calc EF: 27.2 %
Height: 66 in
P 1/2 time: 448 msec
S' Lateral: 3.6 cm
Single Plane A2C EF: 28.8 %
Single Plane A4C EF: 27.1 %
Weight: 2292.78 oz

## 2021-06-12 LAB — MRSA NEXT GEN BY PCR, NASAL: MRSA by PCR Next Gen: NOT DETECTED

## 2021-06-12 LAB — STREP PNEUMONIAE URINARY ANTIGEN: Strep Pneumo Urinary Antigen: NEGATIVE

## 2021-06-12 LAB — CORTISOL: Cortisol, Plasma: 25.2 ug/dL

## 2021-06-12 LAB — GLUCOSE, CAPILLARY
Glucose-Capillary: 154 mg/dL — ABNORMAL HIGH (ref 70–99)
Glucose-Capillary: 147 mg/dL — ABNORMAL HIGH (ref 70–99)
Glucose-Capillary: 206 mg/dL — ABNORMAL HIGH (ref 70–99)
Glucose-Capillary: 181 mg/dL — ABNORMAL HIGH (ref 70–99)

## 2021-06-12 LAB — PHOSPHORUS: Phosphorus: 3.1 mg/dL (ref 2.5–4.6)

## 2021-06-12 LAB — BRAIN NATRIURETIC PEPTIDE: B Natriuretic Peptide: 271.8 pg/mL — ABNORMAL HIGH (ref 0.0–100.0)

## 2021-06-12 LAB — TROPONIN I (HIGH SENSITIVITY)
Troponin I (High Sensitivity): 768 ng/L (ref <18)
Troponin I (High Sensitivity): 3044 ng/L (ref <18)

## 2021-06-12 LAB — LACTIC ACID, PLASMA: Lactic Acid, Venous: 1.4 mmol/L (ref 0.5–1.9)

## 2021-06-12 LAB — HEMOGLOBIN A1C
Hgb A1c MFr Bld: 5.4 % (ref 4.8–5.6)
Mean Plasma Glucose: 108.28 mg/dL

## 2021-06-12 LAB — MAGNESIUM: Magnesium: 1.8 mg/dL (ref 1.7–2.4)

## 2021-06-12 MED ORDER — PROSOURCE TF PO LIQD
45.0000 mL | Freq: Every day | ORAL | Status: DC
  Administered 2021-06-12 – 2021-06-14 (×3): 45 mL
  Filled 2021-06-12 (×3): qty 45

## 2021-06-12 MED ORDER — MAGNESIUM SULFATE 2 GM/50ML IV SOLN
2.0000 g | Freq: Once | INTRAVENOUS | Status: AC
  Administered 2021-06-12: 2 g via INTRAVENOUS
  Filled 2021-06-12: qty 50

## 2021-06-12 MED ORDER — ETOMIDATE 2 MG/ML IV SOLN
10.0000 mg | Freq: Once | INTRAVENOUS | Status: AC
  Administered 2021-06-12: 10 mg via INTRAVENOUS

## 2021-06-12 MED ORDER — FENTANYL 2500MCG IN NS 250ML (10MCG/ML) PREMIX INFUSION: 0.0000 ug/h | INTRAVENOUS | Status: DC

## 2021-06-12 MED ORDER — FAMOTIDINE 20 MG PO TABS: 20.0000 mg | ORAL_TABLET | Freq: Two times a day (BID) | ORAL | Status: DC

## 2021-06-12 MED ORDER — ENOXAPARIN SODIUM 40 MG/0.4ML IJ SOSY: 40.0000 mg | PREFILLED_SYRINGE | INTRAMUSCULAR | Status: DC

## 2021-06-12 MED ORDER — ALBUTEROL SULFATE (2.5 MG/3ML) 0.083% IN NEBU: 2.5000 mg | INHALATION_SOLUTION | RESPIRATORY_TRACT | Status: DC | PRN

## 2021-06-12 MED ORDER — ONDANSETRON HCL 4 MG/2ML IJ SOLN: 4.0000 mg | Freq: Four times a day (QID) | INTRAMUSCULAR | Status: DC | PRN

## 2021-06-12 MED ORDER — CHLORHEXIDINE GLUCONATE CLOTH 2 % EX PADS
6.0000 | MEDICATED_PAD | Freq: Every day | CUTANEOUS | Status: DC
  Administered 2021-06-12 – 2021-06-16 (×5): 6 via TOPICAL

## 2021-06-12 MED ORDER — POLYETHYLENE GLYCOL 3350 17 G PO PACK: 17.0000 g | PACK | Freq: Every day | ORAL | Status: DC | PRN

## 2021-06-12 MED ORDER — ORAL CARE MOUTH RINSE
15.0000 mL | OROMUCOSAL | Status: DC
  Administered 2021-06-12 – 2021-06-14 (×20): 15 mL via OROMUCOSAL

## 2021-06-12 MED ORDER — ACETAMINOPHEN 650 MG RE SUPP
650.0000 mg | Freq: Four times a day (QID) | RECTAL | Status: DC | PRN
  Administered 2021-06-12: 650 mg via RECTAL
  Filled 2021-06-12: qty 1

## 2021-06-12 MED ORDER — GUAIFENESIN-DM 100-10 MG/5ML PO SYRP
10.0000 mL | ORAL_SOLUTION | Freq: Four times a day (QID) | ORAL | Status: DC | PRN
  Administered 2021-06-12 (×2): 10 mL via ORAL
  Filled 2021-06-12 (×3): qty 10

## 2021-06-12 MED ORDER — ETOMIDATE 2 MG/ML IV SOLN
INTRAVENOUS | Status: AC
  Filled 2021-06-12: qty 10

## 2021-06-12 MED ORDER — ETOMIDATE 2 MG/ML IV SOLN
INTRAVENOUS | Status: DC | PRN
  Administered 2021-06-12: 20 mg via INTRAVENOUS

## 2021-06-12 MED ORDER — FAMOTIDINE 20 MG PO TABS
20.0000 mg | ORAL_TABLET | Freq: Two times a day (BID) | ORAL | Status: DC
  Administered 2021-06-12 – 2021-06-14 (×4): 20 mg
  Filled 2021-06-12 (×5): qty 1

## 2021-06-12 MED ORDER — PERFLUTREN LIPID MICROSPHERE
1.0000 mL | INTRAVENOUS | Status: AC | PRN
  Administered 2021-06-12: 2 mL via INTRAVENOUS
  Filled 2021-06-12: qty 10

## 2021-06-12 MED ORDER — HEPARIN BOLUS VIA INFUSION
2000.0000 [IU] | Freq: Once | INTRAVENOUS | Status: AC
  Administered 2021-06-12: 2000 [IU] via INTRAVENOUS
  Filled 2021-06-12: qty 2000

## 2021-06-12 MED ORDER — ROCURONIUM BROMIDE 50 MG/5ML IV SOLN
100.0000 mg | Freq: Once | INTRAVENOUS | Status: AC
  Administered 2021-06-12: 100 mg via INTRAVENOUS

## 2021-06-12 MED ORDER — ASPIRIN 81 MG PO CHEW
81.0000 mg | CHEWABLE_TABLET | Freq: Every day | ORAL | Status: DC
  Administered 2021-06-12 – 2021-06-14 (×3): 81 mg
  Filled 2021-06-12 (×3): qty 1

## 2021-06-12 MED ORDER — FENTANYL CITRATE (PF) 100 MCG/2ML IJ SOLN: 25.0000 ug | INTRAMUSCULAR | Status: DC | PRN

## 2021-06-12 MED ORDER — NOREPINEPHRINE 4 MG/250ML-% IV SOLN
2.0000 ug/min | INTRAVENOUS | Status: DC
  Administered 2021-06-12: 5 ug/min via INTRAVENOUS
  Administered 2021-06-13: 1 ug/min via INTRAVENOUS
  Filled 2021-06-12: qty 250

## 2021-06-12 MED ORDER — DOCUSATE SODIUM 50 MG/5ML PO LIQD
100.0000 mg | Freq: Two times a day (BID) | ORAL | Status: DC
  Administered 2021-06-12 – 2021-06-13 (×2): 100 mg
  Filled 2021-06-12 (×2): qty 10

## 2021-06-12 MED ORDER — MORPHINE SULFATE (PF) 2 MG/ML IV SOLN
2.0000 mg | INTRAVENOUS | Status: DC | PRN
  Administered 2021-06-12 (×3): 2 mg via INTRAVENOUS
  Filled 2021-06-12 (×3): qty 1

## 2021-06-12 MED ORDER — FENTANYL 2500MCG IN NS 250ML (10MCG/ML) PREMIX INFUSION
25.0000 ug/h | INTRAVENOUS | Status: DC
  Administered 2021-06-12: 50 ug/h via INTRAVENOUS
  Administered 2021-06-13: 75 ug/h via INTRAVENOUS
  Filled 2021-06-12 (×2): qty 250

## 2021-06-12 MED ORDER — FENTANYL BOLUS VIA INFUSION
25.0000 ug | INTRAVENOUS | Status: DC | PRN
  Administered 2021-06-12: 25 ug via INTRAVENOUS
  Administered 2021-06-13 (×3): 50 ug via INTRAVENOUS
  Administered 2021-06-13: 25 ug via INTRAVENOUS
  Administered 2021-06-14: 50 ug via INTRAVENOUS
  Filled 2021-06-12: qty 100

## 2021-06-12 MED ORDER — VITAL AF 1.2 CAL PO LIQD
1000.0000 mL | ORAL | Status: DC
  Administered 2021-06-12 – 2021-06-13 (×2): 1000 mL

## 2021-06-12 MED ORDER — ROCURONIUM BROMIDE 10 MG/ML (PF) SYRINGE
PREFILLED_SYRINGE | INTRAVENOUS | Status: AC
  Filled 2021-06-12: qty 10

## 2021-06-12 MED ORDER — HYDROCOD POLI-CHLORPHE POLI ER 10-8 MG/5ML PO SUER
5.0000 mL | Freq: Two times a day (BID) | ORAL | Status: DC | PRN
  Administered 2021-06-12: 5 mL via ORAL
  Filled 2021-06-12: qty 5

## 2021-06-12 MED ORDER — LEVALBUTEROL HCL 0.63 MG/3ML IN NEBU: 0.6300 mg | INHALATION_SOLUTION | Freq: Four times a day (QID) | RESPIRATORY_TRACT | Status: DC | PRN

## 2021-06-12 MED ORDER — DOCUSATE SODIUM 100 MG PO CAPS: 100.0000 mg | ORAL_CAPSULE | Freq: Two times a day (BID) | ORAL | Status: DC | PRN

## 2021-06-12 MED ORDER — POTASSIUM CHLORIDE 20 MEQ PO PACK
40.0000 meq | PACK | Freq: Two times a day (BID) | ORAL | Status: AC
  Administered 2021-06-12 (×2): 40 meq
  Filled 2021-06-12 (×2): qty 2

## 2021-06-12 MED ORDER — NOREPINEPHRINE 4 MG/250ML-% IV SOLN
INTRAVENOUS | Status: AC
  Filled 2021-06-12: qty 250

## 2021-06-12 MED ORDER — PROPOFOL 1000 MG/100ML IV EMUL
5.0000 ug/kg/min | INTRAVENOUS | Status: DC
  Administered 2021-06-12: 10 ug/kg/min via INTRAVENOUS
  Administered 2021-06-12: 55 ug/kg/min via INTRAVENOUS
  Administered 2021-06-12: 25 ug/kg/min via INTRAVENOUS
  Administered 2021-06-13: 55 ug/kg/min via INTRAVENOUS
  Administered 2021-06-13: 50 ug/kg/min via INTRAVENOUS
  Administered 2021-06-13: 55 ug/kg/min via INTRAVENOUS
  Administered 2021-06-13: 50 ug/kg/min via INTRAVENOUS
  Administered 2021-06-14: 65 ug/kg/min via INTRAVENOUS
  Administered 2021-06-14: 55 ug/kg/min via INTRAVENOUS
  Filled 2021-06-12: qty 100
  Filled 2021-06-12: qty 200
  Filled 2021-06-12 (×7): qty 100

## 2021-06-12 MED ORDER — SODIUM BICARBONATE 8.4 % IV SOLN
50.0000 meq | Freq: Once | INTRAVENOUS | Status: AC
  Administered 2021-06-12: 50 meq via INTRAVENOUS
  Filled 2021-06-12: qty 50

## 2021-06-12 MED ORDER — ACETAMINOPHEN 325 MG PO TABS: 650.0000 mg | ORAL_TABLET | Freq: Four times a day (QID) | ORAL | Status: DC | PRN

## 2021-06-12 MED ORDER — CHLORHEXIDINE GLUCONATE 0.12% ORAL RINSE (MEDLINE KIT)
15.0000 mL | Freq: Two times a day (BID) | OROMUCOSAL | Status: DC
  Administered 2021-06-12 – 2021-06-15 (×6): 15 mL via OROMUCOSAL

## 2021-06-12 MED ORDER — HEPARIN (PORCINE) 25000 UT/250ML-% IV SOLN
900.0000 [IU]/h | INTRAVENOUS | Status: DC
  Administered 2021-06-12: 900 [IU]/h via INTRAVENOUS
  Filled 2021-06-12: qty 250

## 2021-06-12 MED ORDER — INSULIN ASPART 100 UNIT/ML IJ SOLN
0.0000 [IU] | INTRAMUSCULAR | Status: DC
  Administered 2021-06-12: 2 [IU] via SUBCUTANEOUS
  Administered 2021-06-12: 3 [IU] via SUBCUTANEOUS
  Administered 2021-06-12: 5 [IU] via SUBCUTANEOUS
  Administered 2021-06-12 – 2021-06-13 (×5): 3 [IU] via SUBCUTANEOUS
  Administered 2021-06-14 (×3): 2 [IU] via SUBCUTANEOUS

## 2021-06-12 MED ORDER — HEPARIN SODIUM (PORCINE) 5000 UNIT/ML IJ SOLN
5000.0000 [IU] | Freq: Three times a day (TID) | INTRAMUSCULAR | Status: DC
  Administered 2021-06-12: 5000 [IU] via SUBCUTANEOUS
  Filled 2021-06-12: qty 1

## 2021-06-12 MED ORDER — SODIUM CHLORIDE 0.9 % IV SOLN: 250.0000 mL | INTRAVENOUS | Status: DC

## 2021-06-12 MED ORDER — LACTATED RINGERS IV SOLN: INTRAVENOUS | Status: DC

## 2021-06-12 MED ORDER — SENNOSIDES-DOCUSATE SODIUM 8.6-50 MG PO TABS: 1.0000 | ORAL_TABLET | Freq: Every evening | ORAL | Status: DC | PRN

## 2021-06-12 MED ORDER — METHYLPREDNISOLONE SODIUM SUCC 125 MG IJ SOLR: 40.0000 mg | Freq: Two times a day (BID) | INTRAMUSCULAR | Status: DC

## 2021-06-12 MED ORDER — METHYLPREDNISOLONE SODIUM SUCC 40 MG IJ SOLR
80.0000 mg | INTRAMUSCULAR | Status: DC
  Administered 2021-06-12: 80 mg via INTRAVENOUS
  Filled 2021-06-12: qty 2

## 2021-06-12 MED ORDER — POLYETHYLENE GLYCOL 3350 17 G PO PACK
17.0000 g | PACK | Freq: Every day | ORAL | Status: DC
  Administered 2021-06-13: 17 g
  Filled 2021-06-12: qty 1

## 2021-06-12 MED ORDER — PIPERACILLIN-TAZOBACTAM 3.375 G IVPB
3.3750 g | Freq: Three times a day (TID) | INTRAVENOUS | Status: AC
  Administered 2021-06-12 – 2021-06-19 (×21): 3.375 g via INTRAVENOUS
  Filled 2021-06-12 (×22): qty 50

## 2021-06-12 NOTE — Assessment & Plan Note (Addendum)
-   Secondary to multifocal pneumonia and acute CHF -Immunocompromised from RA meds -Extubated 2/15, underwent diagnostic bronchoscopy, BAL cultures, AFB smear are negative, fungal cultures pending needs follow-up -Completed ceftriaxone, Zosyn on 2/20 -Was on IV steroids which have been tapered off back to her baseline dose of prednisone 5 Mg daily -Increase activity, repeat chest x-ray, incentive spirometry -Discharge planning

## 2021-06-12 NOTE — ED Notes (Signed)
Dr. Opyd at bedside  

## 2021-06-12 NOTE — Progress Notes (Signed)
Brief Progress Note Repeat ABG reviewed.Significantly improved.  FiO2 decreased to .80.Remains on Low dose Levo after a dose of Morphine that dropped her BP. This is slowly rebounding. K  and Mag have been repleted.  Will trend Troponins, suspect this is demand ischemia as she was very dyspneic prior to intubation. Will address need for central Line ? PICC in am if she remains on pressors. Currently nursing states she is fine with the peripheral lines she has at present time.  Pending BAL this afternoon at 4 pm by Dr. Tacy Learn. Added Fungitell serum for draw.   Additional 15 minutes CC time    Magdalen Spatz, MSN, AGACNP-BC [place] for personal pager PCCM on call pager [phone]. 06/12/2021 2:52 PM

## 2021-06-12 NOTE — Progress Notes (Signed)
Pharmacy Antibiotic Note  Veronica Romero is a 71 y.o. female admitted on 06/11/2021 presenting with respiratory distress, intubated in ED, concern for aspiration.  Pharmacy has been consulted for Zosyn dosing.  Plan: Zosyn 3.375g IV q 8 hours (extended infusion) Monitor renal function, clinical progression and LOT  Height: 6' (182.9 cm) Weight: 65 kg (143 lb 4.8 oz) IBW/kg (Calculated) : 73.1  Temp (24hrs), Avg:98.5 F (36.9 C), Min:98.5 F (36.9 C), Max:98.5 F (36.9 C)  Recent Labs  Lab 06/11/21 1645 06/11/21 1800  WBC 9.8  --   CREATININE 0.77  --   LATICACIDVEN  --  0.9    Estimated Creatinine Clearance: 65.2 mL/min (by C-G formula based on SCr of 0.77 mg/dL).    Allergies  Allergen Reactions   Penicillins Rash    Other reaction(s): RASH Other reaction(s): RASH     Bertis Ruddy, PharmD Clinical Pharmacist ED Pharmacist Phone # [phone] 06/12/2021 9:37 AM

## 2021-06-12 NOTE — H&P (Signed)
NAME:  Veronica Romero, MRN:  [id number], DOB:  Oct 07, 1950, LOS:  ADMISSION DATE:  06/11/2021, CONSULTATION DATE:  06/12/2021 REFERRING MD: Benny Lennert , CHIEF COMPLAINT:  Acute Respiratory Failure, suspect 2/2 Rocuronium given for intubation    History of Present Illness:  71 yo F PMH RA on mtx, pred, humira, recent COVID-19 infection with minimal sx presented to [facility] 2/12 with SOB and cough. Patient has had an intermittent cough following COVID-19 infection, however 4 days prior to ED presentation coughing became more severe. On day of ED presentation pt called daughter for help and on daughter arrival pt was very SOB causing ED presentation. In ED, pt was hypoxic. CXR revealed multifocal PNA, CTA chest without PE but showing bilateral GGO. Started on azithro and rocephin in the ED. COVID, Flu A/B negative.    Overnight pt had increasing O2 requirement, ultimately requiring BiPAP for associated increased work of breathing. On 2/13 patient's respiratory status continued to decline and PCCM was consulted urgently for evaluation of respiratory distress.    ED labs:  PCT < 0.10, LA 0.9   BMP grossly unremarkable  CBC with hgb 10.8 and no leukocytosis    Pertinent  Medical History   RA on chronic immunosuppression     Pertinent  Medical History  Rheumatoid Arthritis  Significant Hospital Events: Including procedures, antibiotic start and stop dates in addition to other pertinent events   2/12 ED presentation for SOB, cough, hypoxia. Admit to [facility]. Azithro, rocephin. CTA neg for PE. Incr WOB requiring BiPAP 2/13 Worsening respiratory status. PCCM consulted for resp distress. Intubated. Transfer to ICU   Interim History / Subjective:  SpO2 85% on 100% NRB  In extremis    Daughter at bedside agrees for intubation  Post intubation patient has been hypertensive , suspect related to paralytics used for intubation  Increased Propofol  Objective   Blood pressure (!) 171/104, pulse (!) 131,  temperature 98.5 F (36.9 C), temperature source Oral, resp. rate (!) 36, SpO2 95 %.    Vent Mode: PRVC FiO2 (%):  [50 %-100 %] 100 % Set Rate:  [30 bmp] 30 bmp Vt Set:  [340 mL] 340 mL PEEP:  [5 cmH20] 5 cmH20   Intake/Output Summary (Last 24 hours) at 06/12/2021 0912 Last data filed at 06/12/2021 0126 Gross per 24 hour  Intake 400 ml  Output --  Net 400 ml   There were no vitals filed for this visit.  Examination: General: Supine in bed, sedated and intubated  HENT:  NCAT, MM Pink and Moist, No LAD, No JVD Lungs: Bilateral chest excursion . Coarse throughout with Rhonchi, no wheeze noted, not assisting vent Cardiovascular:  S1, S2, RRR, Tachy HR of 120's  Abdomen: Soft, NT, ND, BS +, Body mass index is 19.43 kg/m. Extremities:  Clammy to touch, no obvious deformities Neuro:  Sedated and intubated , MAE x 4, confused prior to intubation  GU: Foley cath   [facility] Problem list     Assessment & Plan:  Acute Respiratory Failure 2/2 Multifocal Pneumonia Immunocompromised Patient >> Last doses of humira, prednisone,  and methotrexate/leukovorin  Plan 6 cc TV >> Changed to 7 cc due to Resp Acidosis Maintain Peak pressures < 30  Titrate FIO2 to maintain oxygen saturations > 92% ABG 1 hour post intubation , and in am CXR now  and in am  Propofol drip and Fentanyl prn for RASS goal of -2 for now PRN Xopenex Continue Azithromycin  Will add Zosyn to cover for  atypicals Trend fever and WBC Trend CXR PCT now Sputum for Cx Blood Cx x 2 Urine for Legionella Urine for Strep Lactate now SBT when appropriate  Rheumatoid Arthritis treated with Methotrexate /Humira/prednisone Plan Hold above home medications  Will add Solumedrol 40 mg BID as she is on chronic dosing Propofol and Fentanyl for pain management while intubated  Chest Pain pre Intubation  Admission troponins were 19 and flat Plan EKG now Trend Troponins  HTN post intubation  No Hx so suspect related  to paralytics for intubation Plan Increase propofol Will add antihypertensives only if sustained   Nutrition and medication access Plan Will place Cor Track Dietitian Consult for initiation of TF  Bicarb 1 amy and increased TV to 7 cc after initial ABG ( 7.15/72.5/106/24.7)  Best Practice (right click and "Reselect all SmartList Selections" daily)   Diet/type: NPO w/ meds via tube DVT prophylaxis: prophylactic heparin  GI prophylaxis: H2B Lines: N/A Foley:  N/A Code Status:  full code Last date of multidisciplinary goals of care discussion [ on going]  Labs   CBC: Recent Labs  Lab 06/11/21 1645  WBC 9.8  HGB 10.8*  HCT 31.2*  MCV 91.8  PLT [id number]    Basic Metabolic Panel: Recent Labs  Lab 06/11/21 1645  NA 135  K 3.5  CL 102  CO2 21*  GLUCOSE 113*  BUN 8  CREATININE 0.77  CALCIUM 8.3*   GFR: CrCl cannot be calculated (Unknown ideal weight.). Recent Labs  Lab 06/11/21 1645 06/11/21 1800 06/12/21 0145 06/12/21 0442  PROCALCITON  --   --  <0.10 <0.10  WBC 9.8  --   --   --   LATICACIDVEN  --  0.9  --   --     Liver Function Tests: No results for input(s): AST, ALT, ALKPHOS, BILITOT, PROT, ALBUMIN in the last 168 hours. No results for input(s): LIPASE, AMYLASE in the last 168 hours. No results for input(s): AMMONIA in the last 168 hours.  ABG No results found for: PHART, PCO2ART, PO2ART, HCO3, TCO2, ACIDBASEDEF, O2SAT   Coagulation Profile: Recent Labs  Lab 06/11/21 1800  INR 1.1    Cardiac Enzymes: No results for input(s): CKTOTAL, CKMB, CKMBINDEX, TROPONINI in the last 168 hours.  HbA1C: No results found for: HGBA1C  CBG: No results for input(s): GLUCAP in the last 168 hours.  Review of Systems:   Unable, sedated and intubated  Past Medical History:  She,  has a past medical history of Rheumatoid arthritis ([place]).   Surgical History:  History reviewed. No pertinent surgical history.   Social History:   reports that she has never  smoked. She has never used smokeless tobacco. She reports that she does not currently use alcohol. She reports that she does not currently use drugs.   Family History:  Her family history is not on file.   Allergies Allergies  Allergen Reactions   Penicillins Rash    Other reaction(s): RASH Other reaction(s): RASH      Home Medications  Prior to Admission medications   Medication Sig Start Date End Date Taking? Authorizing Provider  Adalimumab (HUMIRA PEN) 40 MG/0.4ML PNKT Inject 0.4 mLs into the skin once a week. Tuesday 04/10/21  Yes [provider]  leucovorin (WELLCOVORIN) 5 MG tablet Take 15 mg by mouth once a week. Saturdays 04/07/21  Yes [provider]  methotrexate 50 MG/2ML injection Inject 50 mg/m2 into the muscle once a week. Saturdays 03/21/21  Yes [provider]  predniSONE (  DELTASONE) 5 MG tablet Take 5 mg by mouth daily. 04/12/21  Yes [provider]     Critical care time: 45 minutes    Magdalen Spatz, MSN, AGACNP-BC [place] for personal pager PCCM on call pager [phone]  06/12/2021 11:13 AM

## 2021-06-12 NOTE — Progress Notes (Signed)
Dear Doctor:  This patient has been identified as a candidate for PICC for the following reason (s): IV therapy over 48 hours, drug extravasation potential with tissue necrosis (KCL, Dilantin, Dopamine, CaCl, MgSO4, chemo vesicant), poor veins/poor circulatory system (CHF, COPD, emphysema, diabetes, steroid use, IV drug abuse, etc.), and incompatible drugs (aminophyllin, TPN, heparin, given with an antibiotic) If you agree, please write an order for the indicated device.   Thank you for supporting the early vascular access assessment program.

## 2021-06-12 NOTE — ED Notes (Signed)
Respiratory contacted.

## 2021-06-12 NOTE — Progress Notes (Signed)
Cross-coverage note:   Patient seen for increased oxygen requirement and increased WOB.   Brief HPI: She is a 71 yr old with RA and COVID ~1 mo ago which she said was mild and had completely resolved prior to developing severe non-productive cough, pleuritic pain and SOB 4 days ago.   ED course: She is afebrile in ED, lactate and WBC are normal, initial imaging most suggestive of multifocal PNA, and she was cultured and started on antibiotics.   CTA negative for PE but notable for extensive interstitial and ground-glass opacities bilaterally.   She is alert, able to speak full sentences but has frequent cough, RR in upper 20s with labored breathing, and diffuse rales.   Plan to trial BiPAP, stop IVF for now, add BNP and procalcitonin.

## 2021-06-12 NOTE — Procedures (Signed)
Bedside Bronchoscopy Procedure Note EVETTA FULP [id number] [birth year]  Procedure: Bronchoscopy Indications: Diagnostic evaluation of the airways and Obtain specimens for culture and/or other diagnostic studies  Procedure Details: ET Tube Size: ET Tube secured at lip (cm): Bite block in place: Yes In preparation for procedure, Patient hyper-oxygenated with 100 % FiO2 and Saline given via ETT (50 ml) Airway entered and the following bronchi were examined: RUL, RML, RLL, LUL, and LLL.   Bronchoscope removed.  , Patient placed back on 100% FiO2 at conclusion of procedure.    Evaluation BP (!) 108/49    Pulse 86    Temp (!) 102.9 F (39.4 C) (Oral) Comment: tylenol and ice paks applied   Resp (!) 34    Ht 5\' 6"  (1.676 m)    Wt 65 kg    SpO2 93%    BMI 23.13 kg/m  Breath Sounds:Clear and Diminished O2 sats: stable throughout Patient's Current Condition: stable Specimens:  Sent serosanguinous fluid Complications: No apparent complications Patient did tolerate procedure well.   Littie Deeds [facility] 06/12/2021, 4:02 PM

## 2021-06-12 NOTE — Progress Notes (Signed)
Withdrew ETT 2 CM per Dr. Merrily Pew during bronchoscopy.  ETT now at [facility]

## 2021-06-12 NOTE — Progress Notes (Signed)
Called to patient room this morning due to respiratory distress and declining saturations on NRB. Patient unable to tolerate BIPAP as she feels that she needs to vomit. As I enter room the patient is in extremis. She is tachypneic in the 30's, HR 133, sitting up, and using accessory muscles to breath. She can barely speak to me due to her work of breathing. I ask her if she wants to be intubated. She tells me "I don't know". Her daughter arrives and states that she wants the patient to be intubated. PCCM is consulted. Stat ABG is ordered.  Vitals:   06/12/21 0849 06/12/21 0904  BP:    Pulse: (!) 131   Resp: (!) 36   Temp:    SpO2: 90% 95%    Exam:  Constitutional:  The patient is awake, alert, and oriented x 3. She is in severe distress due to increased work of breathing. Respiratory:  Greatly increased work of breathing with tachypnea, 1 work conversational dyspnea, and accessory muscle use Coarse rales and rhonchi.  No wheezes No tactile fremitus Cardiovascular:  Tachycardia with rate of 120-130 No murmurs, ectopy, or gallups. No lateral PMI. No thrills. Abdomen:  Abdomen is soft, non-tender, non-distended No hernias, masses, or organomegaly Normoactive bowel sounds.  Scaffoid Musculoskeletal:  No cyanosis, clubbing, or edema Cachectic Skin:  No rashes, lesions, ulcers palpation of skin: no induration or nodules Neurologic:  Unable to evaluate as the patient is unable to cooperate with exam. Psychiatric:  Mental status Mood, affect appropriate Orientation to person, place, time  judgment and insight appear intact  Assessment and Plan: * Multifocal pneumonia- (present on admission) Ms. Yonker will be admitted to the ICU with mechanical ventilation for acute hypoxic and hypercapneic respiratory failure. COVID -2 negative. Influenza negative. Will check respiratory viral panel. She is receiving rocephin and azithromycin. Patient is on humira, prednisone,  and  methotrexate/leukovorin for RA at home.  Acute respiratory failure with hypoxia and hypercapnia ([place])- (present on admission) Due to multifocal pneumonia. Pt is severe distress. PCCM consulted. The patient will be intubated and admitted to ICU.Marland Kitchen  Dyspnea Due to multifocal pneumonia> COVID-2 negative.  No PE on CTA chest. Pt now with severe respiratory distress and failure. She will require urgent intubation.  Chest pain Initial troponins were 19 and flat. Will recheck with EKG due to severe distress and patient complaint of chest pain.    I have spent 45 minutes in the evaluation, care, and coordination of care of this patient.   Earnestine Tuohey, DO [facility]  06/12/2021 9:11 AM

## 2021-06-12 NOTE — ED Notes (Signed)
Patient Spo2 dropped to 75% on 5L [place], patient placed on non-rebreather and Spo2 increased to 95%.

## 2021-06-12 NOTE — Progress Notes (Signed)
Pt. Transported to 19M-11 from ED w/o complications. RN at bedside.

## 2021-06-12 NOTE — Progress Notes (Signed)
Initial Nutrition Assessment  DOCUMENTATION CODES:   Non-severe (moderate) malnutrition in context of acute illness/injury  INTERVENTION:   Initiate tube feeding via Cortrak tube: Vital AF 1.2 at 25 ml/h, increase by 10 ml every 4 hours to goal rate of 55 ml/h (1320 ml per day). Prosource TF 45 ml once daily  Provides 1624 kcal (2036 kcal total with propofol), 110 gm protein, 1071 ml free water daily.  NUTRITION DIAGNOSIS:   Moderate Malnutrition related to acute illness (PNA) as evidenced by moderate muscle depletion, mild fat depletion, moderate fat depletion.  GOAL:   Patient will meet greater than or equal to 90% of their needs  MONITOR:   Vent status, TF tolerance, Labs  REASON FOR ASSESSMENT:   Ventilator, Consult Enteral/tube feeding initiation and management  ASSESSMENT:   71 yo female admitted with acute respiratory failure, severe sepsis, bilateral multifocal pneumonia. PMH includes Rheumatoid arthritis.  Discussed patient in ICU rounds and with RN today. Patient admitted from home with PNA. Intubated today for worsening respiratory status.  Received MD Consult for TF initiation and management. Cortrak has been placed, tip is gastric.  Patient is currently intubated on ventilator support MV: 11 L/min Temp (24hrs), Avg:100.7 F (38.2 C), Min:98.5 F (36.9 C), Max:102.9 F (39.4 C)  Propofol: 15.6 ml/hr providing 412 kcal from lipid.  Labs reviewed. K 3 CBG: 154  Medications reviewed and include Colace, Novolog, Solumedrol, Miralax, potassium chloride, mag sulfate, Levophed, propofol.  No weight history available for review. Spoke with patient's daughter at bedside. She reports that patient usually eats well and has not lost any weight recently. She has had a poor appetite for the past week d/t acute illness.   NUTRITION - FOCUSED PHYSICAL EXAM:  Flowsheet Row Most Recent Value  Orbital Region No depletion  Upper Arm Region Mild depletion   Thoracic and Lumbar Region Mild depletion  Buccal Region Moderate depletion  Temple Region Moderate depletion  Clavicle Bone Region Moderate depletion  Clavicle and Acromion Bone Region Moderate depletion  Scapular Bone Region Unable to assess  Dorsal Hand Unable to assess  Patellar Region No depletion  Anterior Thigh Region No depletion  Posterior Calf Region No depletion  Edema (RD Assessment) None  Hair Reviewed  Eyes Unable to assess  Mouth Reviewed  Skin Reviewed  Nails Reviewed       Diet Order:   Diet Order             Diet Heart Room service appropriate? Yes; Fluid consistency: Thin  Diet effective now                   EDUCATION NEEDS:   No education needs have been identified at this time  Skin:  Skin Assessment: Reviewed RN Assessment  Last BM:  no BM documented  Height:   Ht Readings from Last 1 Encounters:  06/12/21 5\' 6"  (1.676 m)    Weight:   Wt Readings from Last 1 Encounters:  06/12/21 65 kg    BMI:  Body mass index is 23.13 kg/m.  Estimated Nutritional Needs:   Kcal:  7530-0511  Protein:  100-115 gm  Fluid:  1.7-1.9 L    Gabriel Rainwater RD, LDN, CNSC Please refer to Amion for contact information.

## 2021-06-12 NOTE — Procedures (Signed)
Intubation Procedure Note  Veronica Romero  [id number]  04/08/1951  Date:06/12/21  Time:12:10 PM   Provider Performing:Goro Wenrick    Procedure: Intubation (31500)  Indication(s) Respiratory Failure  Consent Risks of the procedure as well as the alternatives and risks of each were explained to the patient and/or caregiver.  Consent for the procedure was obtained and is signed in the bedside chart   Anesthesia Etomidate and Rocuronium   Time Out Verified patient identification, verified procedure, site/side was marked, verified correct patient position, special equipment/implants available, medications/allergies/relevant history reviewed, required imaging and test results available.   Sterile Technique Usual hand hygeine, masks, and gloves were used   Procedure Description Patient positioned in bed supine.  Sedation given as noted above.  Patient was intubated with endotracheal tube using  DL .  View was Grade 2 only posterior commissure .  Number of attempts was 1.  Colorimetric CO2 detector was consistent with tracheal placement.   Complications/Tolerance None; patient tolerated the procedure well. Chest X-ray is ordered to verify placement.   EBL Minimal   Specimen(s) None

## 2021-06-12 NOTE — Procedures (Signed)
Bronchoscopy Procedure Note  RUWAYDA CURET  [id number]  [birth year]  Date:06/12/21  Time:4:00 PM   Provider Performing:Elston Aldape   Procedure(s):  Flexible bronchoscopy with bronchial alveolar lavage (25003) and Initial Therapeutic Aspiration of Tracheobronchial Tree (70488)  Indication(s) Acute respiratory failure  Consent Risks of the procedure as well as the alternatives and risks of each were explained to the patient and/or caregiver.  Consent for the procedure was obtained and is signed in the bedside chart  Anesthesia Etomidate and rocuronium   Time Out Verified patient identification, verified procedure, site/side was marked, verified correct patient position, special equipment/implants available, medications/allergies/relevant history reviewed, required imaging and test results available.   Sterile Technique Usual hand hygiene, masks, gowns, and gloves were used   Procedure Description Bronchoscope advanced through endotracheal tube and into airway.  Airways were examined down to subsegmental level with findings noted below.   Following diagnostic evaluation, BAL(s) performed in RML/LUL with normal saline and return of 10 fluid  Findings: Friable mucosa with thick whitish patches noted, BAL was performed and sent for culture   Complications/Tolerance None; patient tolerated the procedure well. Chest X-ray is needed post procedure.   EBL Minimal

## 2021-06-12 NOTE — Code Documentation (Signed)
0850 - 20 mg etomidate 0851 - 100 mg rocuronium 0854 - successful intubation

## 2021-06-12 NOTE — Procedures (Signed)
Cortrak ° °Person Inserting Tube:  Kelli Egolf E, RD °Tube Type:  Cortrak - 43 inches °Tube Size:  10 °Tube Location:  Left nare °Initial Placement:  Stomach °Secured by: Bridle °Technique Used to Measure Tube Placement:  Marking at nare/corner of mouth °Cortrak Secured At:  65 cm ° °Cortrak Tube Team Note: ° °Consult received to place a Cortrak feeding tube.  ° °X-ray is required, abdominal x-ray has been ordered by the Cortrak team. Please confirm tube placement before using the Cortrak tube.  ° °If the tube becomes dislodged please keep the tube and contact the Cortrak team at [email or web address] (password [email or web address]) for replacement.  °If after hours and replacement cannot be delayed, place a NG tube and confirm placement with an abdominal x-ray.  ° ° ° °Julien Berryman A., MS, RD, LDN (she/her/hers) °RD pager number and weekend/on-call pager number located in Amion. ° ° °

## 2021-06-12 NOTE — Progress Notes (Signed)
Patient serum troponin level trended up to 3044. Likely patient has acute non-ST elevation MI, will start IV heparin infusion and aspirin per pharmacy protocol Echocardiogram is done, will await results, if it shows wall motion abnormalities are continue uptrending troponins, will consider cardiology consult     Cheri Fowler MD [facility] See Amion for pager If no response to pager, please call [phone] until 7pm After 7pm, Please call E-link [phone]

## 2021-06-12 NOTE — Progress Notes (Signed)
Pt requested break from bipap. Removed bipap and placed on NRB. Pt tolerating well. Will continue to monitor.

## 2021-06-12 NOTE — Progress Notes (Signed)
ANTICOAGULATION CONSULT NOTE - Initial Consult  Pharmacy Consult for Heparin Indication: chest pain/ACS  Allergies  Allergen Reactions   Penicillins Rash    Other reaction(s): RASH Other reaction(s): RASH     Patient Measurements: Height: 5\' 6"  (167.6 cm) Weight: 65 kg (143 lb 4.8 oz) IBW/kg (Calculated) : 59.3   Vital Signs: Temp: 102.9 F (39.4 C) (02/13 1006) Temp Source: Oral (02/13 1006) BP: 105/50 (02/13 1745) Pulse Rate: 86 (02/13 1745)  Labs: Recent Labs    06/11/21 1645 06/11/21 1800 06/11/21 2229 06/12/21 1016 06/12/21 1115 06/12/21 1410 06/12/21 1627  HGB 10.8*  --   --  10.9* 10.4* 9.2*  --   HCT 31.2*  --   --  32.0* 30.8* 27.0*  --   PLT 366  --   --   --  392  --   --   APTT  --  32  --   --   --   --   --   LABPROT  --  14.5  --   --   --   --   --   INR  --  1.1  --   --   --   --   --   CREATININE 0.77  --   --   --  0.76  --  0.85  TROPONINIHS 25*  --  19*  --  768*  --  3,044*    Estimated Creatinine Clearance: 56 mL/min (by C-G formula based on SCr of 0.85 mg/dL).   Medical History: Past Medical History:  Diagnosis Date   Rheumatoid arthritis ([place])      Assessment: 78yom with recent COVID admitted with increased SOB and intubated - CT negative for PE.  Trop bump up to 3000. Pharmacy asked to dose IV heparin for ACS.    Goal of Therapy:  Heparin level 0.3-0.7 units/ml Monitor platelets by anticoagulation protocol: Yes   Plan:  Heparin bolus 2000 utsIV x1  Heparin drip 900 uts/hr  Heparin level 6hr after start  Daily heparin level and CBC  Monitor s/s bleeding   Bonnita Nasuti Pharm.D. CPP, BCPS Clinical Pharmacist [phone] 06/12/2021 6:40 PM

## 2021-06-13 DIAGNOSIS — D849 Immunodeficiency, unspecified: Secondary | ICD-10-CM | POA: Diagnosis not present

## 2021-06-13 DIAGNOSIS — J9601 Acute respiratory failure with hypoxia: Secondary | ICD-10-CM | POA: Diagnosis not present

## 2021-06-13 DIAGNOSIS — Z9911 Dependence on respirator [ventilator] status: Secondary | ICD-10-CM

## 2021-06-13 DIAGNOSIS — J181 Lobar pneumonia, unspecified organism: Secondary | ICD-10-CM | POA: Diagnosis not present

## 2021-06-13 LAB — POCT I-STAT 7, (LYTES, BLD GAS, ICA,H+H)
Acid-base deficit: 1 mmol/L (ref 0.0–2.0)
Acid-base deficit: 2 mmol/L (ref 0.0–2.0)
Bicarbonate: 22 mmol/L (ref 20.0–28.0)
Bicarbonate: 23.7 mmol/L (ref 20.0–28.0)
Calcium, Ion: 1.17 mmol/L (ref 1.15–1.40)
Calcium, Ion: 1.14 mmol/L — ABNORMAL LOW (ref 1.15–1.40)
HCT: 26 % — ABNORMAL LOW (ref 36.0–46.0)
HCT: 34 % — ABNORMAL LOW (ref 36.0–46.0)
Hemoglobin: 8.8 g/dL — ABNORMAL LOW (ref 12.0–15.0)
Hemoglobin: 11.6 g/dL — ABNORMAL LOW (ref 12.0–15.0)
O2 Saturation: 96 %
O2 Saturation: 97 %
Patient temperature: 97.1
Patient temperature: 97.6
Potassium: 3.7 mmol/L (ref 3.5–5.1)
Potassium: 3.9 mmol/L (ref 3.5–5.1)
Sodium: 139 mmol/L (ref 135–145)
Sodium: 140 mmol/L (ref 135–145)
TCO2: 23 mmol/L (ref 22–32)
TCO2: 25 mmol/L (ref 22–32)
pCO2 arterial: 29.8 mmHg — ABNORMAL LOW (ref 32–48)
pCO2 arterial: 41.8 mmHg (ref 32–48)
pH, Arterial: 7.471 — ABNORMAL HIGH (ref 7.35–7.45)
pH, Arterial: 7.359 (ref 7.35–7.45)
pO2, Arterial: 72 mmHg — ABNORMAL LOW (ref 83–108)
pO2, Arterial: 88 mmHg (ref 83–108)

## 2021-06-13 LAB — GLUCOSE, CAPILLARY
Glucose-Capillary: 186 mg/dL — ABNORMAL HIGH (ref 70–99)
Glucose-Capillary: 161 mg/dL — ABNORMAL HIGH (ref 70–99)
Glucose-Capillary: 113 mg/dL — ABNORMAL HIGH (ref 70–99)
Glucose-Capillary: 160 mg/dL — ABNORMAL HIGH (ref 70–99)
Glucose-Capillary: 190 mg/dL — ABNORMAL HIGH (ref 70–99)
Glucose-Capillary: 128 mg/dL — ABNORMAL HIGH (ref 70–99)

## 2021-06-13 LAB — BASIC METABOLIC PANEL
Anion gap: 9 (ref 5–15)
BUN: 13 mg/dL (ref 8–23)
CO2: 21 mmol/L — ABNORMAL LOW (ref 22–32)
Calcium: 8.2 mg/dL — ABNORMAL LOW (ref 8.9–10.3)
Chloride: 108 mmol/L (ref 98–111)
Creatinine, Ser: 0.7 mg/dL (ref 0.44–1.00)
GFR, Estimated: >60 mL/min (ref >60)
Glucose, Bld: 192 mg/dL — ABNORMAL HIGH (ref 70–99)
Potassium: 3.8 mmol/L (ref 3.5–5.1)
Sodium: 138 mmol/L (ref 135–145)

## 2021-06-13 LAB — MAGNESIUM: Magnesium: 2.7 mg/dL — ABNORMAL HIGH (ref 1.7–2.4)

## 2021-06-13 LAB — LEGIONELLA PNEUMOPHILA SEROGP 1 UR AG: L. pneumophila Serogp 1 Ur Ag: NEGATIVE

## 2021-06-13 LAB — RESPIRATORY PANEL BY PCR

## 2021-06-13 LAB — TRIGLYCERIDES: Triglycerides: 66 mg/dL (ref <150)

## 2021-06-13 LAB — CBC
HCT: 29.2 % — ABNORMAL LOW (ref 36.0–46.0)
Hemoglobin: 9.8 g/dL — ABNORMAL LOW (ref 12.0–15.0)
MCH: 31.1 pg (ref 26.0–34.0)
MCHC: 33.6 g/dL (ref 30.0–36.0)
MCV: 92.7 fL (ref 80.0–100.0)
Platelets: 296 10*3/uL (ref 150–400)
RBC: 3.15 MIL/uL — ABNORMAL LOW (ref 3.87–5.11)
RDW: 13.6 % (ref 11.5–15.5)
WBC: 10.7 10*3/uL — ABNORMAL HIGH (ref 4.0–10.5)
nRBC: 0 % (ref 0.0–0.2)

## 2021-06-13 LAB — HEPARIN LEVEL (UNFRACTIONATED): Heparin Unfractionated: 0.31 IU/mL (ref 0.30–0.70)

## 2021-06-13 LAB — PHOSPHORUS
Phosphorus: 2.3 mg/dL — ABNORMAL LOW (ref 2.5–4.6)
Phosphorus: 3.8 mg/dL (ref 2.5–4.6)

## 2021-06-13 LAB — PROCALCITONIN: Procalcitonin: 5.47 ng/mL

## 2021-06-13 LAB — TROPONIN I (HIGH SENSITIVITY): Troponin I (High Sensitivity): 1806 ng/L (ref <18)

## 2021-06-13 MED ORDER — POTASSIUM & SODIUM PHOSPHATES 280-160-250 MG PO PACK
1.0000 | PACK | Freq: Three times a day (TID) | ORAL | Status: DC
  Administered 2021-06-13 – 2021-06-14 (×7): 1
  Filled 2021-06-13 (×10): qty 1

## 2021-06-13 MED ORDER — FUROSEMIDE 10 MG/ML IJ SOLN
60.0000 mg | Freq: Two times a day (BID) | INTRAMUSCULAR | Status: DC
  Administered 2021-06-13 – 2021-06-14 (×3): 60 mg via INTRAVENOUS
  Filled 2021-06-13 (×4): qty 6

## 2021-06-13 MED ORDER — DOCUSATE SODIUM 50 MG/5ML PO LIQD: 100.0000 mg | Freq: Two times a day (BID) | ORAL | Status: DC | PRN

## 2021-06-13 MED ORDER — ACETAMINOPHEN 650 MG RE SUPP: 650.0000 mg | Freq: Four times a day (QID) | RECTAL | Status: DC | PRN

## 2021-06-13 MED ORDER — POTASSIUM PHOSPHATES 15 MMOLE/5ML IV SOLN
15.0000 mmol | Freq: Once | INTRAVENOUS | Status: AC
  Administered 2021-06-13: 15 mmol via INTRAVENOUS
  Filled 2021-06-13: qty 5

## 2021-06-13 MED ORDER — FOLIC ACID 1 MG PO TABS
1.0000 mg | ORAL_TABLET | Freq: Every day | ORAL | Status: DC
  Administered 2021-06-14: 1 mg
  Filled 2021-06-13: qty 1

## 2021-06-13 MED ORDER — INSULIN ASPART 100 UNIT/ML IJ SOLN
2.0000 [IU] | INTRAMUSCULAR | Status: DC
  Administered 2021-06-13 – 2021-06-14 (×6): 2 [IU] via SUBCUTANEOUS

## 2021-06-13 MED ORDER — ENOXAPARIN SODIUM 80 MG/0.8ML IJ SOSY
1.0000 mg/kg | PREFILLED_SYRINGE | Freq: Two times a day (BID) | INTRAMUSCULAR | Status: DC
  Administered 2021-06-13 – 2021-06-15 (×5): 65 mg via SUBCUTANEOUS
  Filled 2021-06-13 (×5): qty 0.65

## 2021-06-13 MED ORDER — ACETAMINOPHEN 325 MG PO TABS: 650.0000 mg | ORAL_TABLET | Freq: Four times a day (QID) | ORAL | Status: DC | PRN

## 2021-06-13 MED ORDER — FOLIC ACID 1 MG PO TABS
1.0000 mg | ORAL_TABLET | Freq: Every day | ORAL | Status: DC
  Administered 2021-06-13: 1 mg via ORAL
  Filled 2021-06-13: qty 1

## 2021-06-13 MED ORDER — FUROSEMIDE 10 MG/ML IJ SOLN
40.0000 mg | Freq: Once | INTRAMUSCULAR | Status: AC
  Administered 2021-06-13: 40 mg via INTRAVENOUS
  Filled 2021-06-13: qty 4

## 2021-06-13 MED ORDER — HYDROCORTISONE SOD SUC (PF) 100 MG IJ SOLR
100.0000 mg | Freq: Two times a day (BID) | INTRAMUSCULAR | Status: DC
  Administered 2021-06-13 – 2021-06-14 (×4): 100 mg via INTRAVENOUS
  Filled 2021-06-13 (×4): qty 2

## 2021-06-13 MED ORDER — THIAMINE HCL 100 MG PO TABS
100.0000 mg | ORAL_TABLET | Freq: Every day | ORAL | Status: DC
  Administered 2021-06-13 – 2021-06-14 (×2): 100 mg
  Filled 2021-06-13 (×2): qty 1

## 2021-06-13 MED ORDER — ADULT MULTIVITAMIN LIQUID CH
15.0000 mL | Freq: Every day | ORAL | Status: DC
  Administered 2021-06-13 – 2021-06-14 (×2): 15 mL
  Filled 2021-06-13 (×2): qty 15

## 2021-06-13 MED ORDER — POLYETHYLENE GLYCOL 3350 17 G PO PACK: 17.0000 g | PACK | Freq: Every day | ORAL | Status: DC | PRN

## 2021-06-13 NOTE — Progress Notes (Signed)
eLink Physician-Brief Progress Note Patient Name: Veronica Romero DOB: 1951-04-14 MRN: [id number]   Date of Service  06/13/2021  HPI/Events of Note  ABG on 50%/PRVC 32/TV 450/P 5 = 7.47/29.8/72/22.  eICU Interventions  Plan: Decrease PRVC rate to 25. Repeat ABG at 7:30 AM.     Intervention Category Major Interventions: Respiratory failure - evaluation and management  Draycen Leichter Eugene 06/13/2021, 4:32 AM

## 2021-06-13 NOTE — Progress Notes (Addendum)
NAME:  Veronica Romero, MRN:  [id number], DOB:  [birth year], LOS:  ADMISSION DATE:  06/11/2021, CONSULTATION DATE:  06/12/2021 REFERRING MD: Benny Lennert  CHIEF COMPLAINT:  Acute Respiratory Failure  History of Present Illness:  71 yo F PMH RA on methotrexate, prednisone, humira, recent COVID-19 infection with minimal sx presented to [facility] 2/12 with SOB and cough. Patient had an intermittent cough following COVID-19 infection, however 4 days prior to ED presentation coughing became more severe. On day of ED presentation pt called daughter for help and on daughter arrival pt was very SOB.  In ED, pt was hypoxic. CXR revealed multifocal PNA, CTA chest without PE but showing bilateral GGO. Started on azithro and rocephin in the ED. COVID, Flu A/B negative.    Overnight 2/12, pt had increasing O2 requirement, ultimately requiring BiPAP for associated increased work of breathing. On 2/13 patient's respiratory status continued to decline and PCCM was consulted urgently for evaluation of respiratory distress.   Pertinent  Medical History  RA on chronic immunosuppression  Significant Hospital Events: Including procedures, antibiotic start and stop dates in addition to other pertinent events   2/12 ED presentation for SOB, cough, hypoxia. Admit to [facility]. Azithro, rocephin. CTA neg for PE. Incr WOB requiring BiPAP 2/13 Worsening respiratory status. PCCM consulted for resp distress. Intubated. Transfer to ICU, bronchoscopy completed with BAL sent. Abx switched to Azitho, zosyn  Interim History / Subjective:  4 beats of non-sustained vtach on tele, Troponin elevated to 3044->1806   Objective   Blood pressure (!) 108/52, pulse 71, temperature (!) 97.1 F (36.2 C), temperature source Axillary, resp. rate (!) 25, height _0  (1.676 m), weight 65 kg, SpO2 94 %.    Vent Mode: PRVC FiO2 (%):  [50 %-100 %] 50 % Set Rate:  [25 bmp-34 bmp] 25 bmp Vt Set:  [340 mL-450 mL] 450 mL PEEP:  [5 cmH20] 5 cmH20 Plateau  Pressure:  [20 cmH20-23 cmH20] 23 cmH20   Intake/Output Summary (Last 24 hours) at 06/13/2021 3007 Last data filed at 06/13/2021 0600 Gross per 24 hour  Intake 2874.89 ml  Output 1135 ml  Net 1739.89 ml   Filed Weights   06/12/21 0926  Weight: 65 kg    Examination: Constitutional: elderly female, intubated and sedated, does not open eyes to voice or touch HENT: normocephalic atraumatic, mucous membranes moist Eyes: conjunctiva non-erythematous Neck: supple Cardiovascular: regular rate and rhythm, no m/r/g Pulmonary/Chest: intubated, lungs clear to auscultation bilaterally, no wheezing or rhonchi appreciated Abdominal: soft, non-tender, non-distended MSK: normal bulk and tone Neurological: sedated Skin: warm and dry  7.45/ 39/ 360/ 26.6 WBC 23.1- 10.7 Hgb 9.2-9.8 Mg 2.7 Phos 2.3 K 3.8 BG 150-200s Net + 2511cc Troponin increased to 3044->1806 TEE: EF 25-30% with severely decreased function of left ventricle.  Right ventricular systolic function is mildly reduced, mildly elevated pulmonary artery pressure, 38.2 mmHg  Resolved Hospital Problem list     Assessment & Plan:  Acute Hypoxic Respiratory Failure 2/2 Multifocal Community Acquired Pneumonia ARDS Immunocompromised Patient >> humira, prednisone,  and methotrexate/leukovorin  Leukocytosis - Bronchoscopy completed 2/13 with BAL samples sent. BAL culture with few WBC present, both PMN and mononuclear. MRSA PCR negative.  - PaO2/ FiO2 176, consistent with moderate ARDS  - Follow-up BAL culture, fungus, acid fast - Continue Zosyn q 8 hrs day 2, Azithromycin day 3 - Leukocytosis improving - blood cultures ngtd - repeat ABG at 730 - Levalbuterol - Methylprednisolone 80 mg - PAD protocol - Lasix 40 mg  Elevated troponins Acute non-ST elevation MI Decreased EF, likely Takotsubo cardiomyopathy Troponin increased to 3,044, EKG with no ST changes. Echo showed EF 25-30% with severely decreased function of left  ventricle.  Right ventricular systolic function is mildly reduced, mildly elevated pulmonary artery pressure, 38.2 mmHg. Will hold off on consulting cardiology until more stable. -Heparin gtt started, switching to lovenox -ASA -Consider consulting cardiology once more stable - tele - keep Mg >2.0, K > 4.0  Rheumatoid Arthritis treated with Methotrexate /Humira/prednisone - hold immunosuppressive therapy - Continue Solumedrol 80 mg  - Propofol and Fentanyl for pain management while intubated  Hyperglycemia BGs 150-200s. Patient receiving steroids. She has required 16 units novolog in past 24 hours. -CBGs q 4 hrs -tube feed coverage with hold parameters  Hypophosphatemia Phos 2.3 -K phos  Normocytic anemia Hgb 9.8, MCV 92.7. Hgb stable, will consider further work-up once patient more stable.  Best Practice (right click and "Reselect all SmartList Selections" daily)   Diet/type: NPO DVT prophylaxis: systemic heparin GI prophylaxis: H2B Lines: N/A Foley:  Yes, and it is still needed Code Status:  full code Last date of multidisciplinary goals of care discussion [daughter updated at bedside 2/14]  Ivory Bail M. Lynesha Bango, D.O.  Internal Medicine Resident, PGY-1 [facility]  Pager: [phone] 6:25 AM, 06/13/2021

## 2021-06-13 NOTE — Progress Notes (Addendum)
ANTICOAGULATION CONSULT NOTE Pharmacy Consult for Heparin Indication: chest pain/ACS Brief A/P: Heparin level within goal range Continue Heparin at current rate   Allergies  Allergen Reactions   Penicillins Rash    Other reaction(s): RASH Other reaction(s): RASH     Patient Measurements: Height: 5\' 6"  (167.6 cm) Weight: 65 kg (143 lb 4.8 oz) IBW/kg (Calculated) : 59.3   Vital Signs: Temp: 97.5 F (36.4 C) (02/13 2300) Temp Source: Oral (02/13 2300) BP: 116/57 (02/14 0300) Pulse Rate: 71 (02/14 0300)  Labs: Recent Labs    06/11/21 1645 06/11/21 1800 06/11/21 2229 06/12/21 1016 06/12/21 1115 06/12/21 1410 06/12/21 1627 06/13/21 0044  HGB 10.8*  --   --    < > 10.4* 9.2*  --  9.8*  HCT 31.2*  --   --    < > 30.8* 27.0*  --  29.2*  PLT 366  --   --   --  392  --   --  296  APTT  --  32  --   --   --   --   --   --   LABPROT  --  14.5  --   --   --   --   --   --   INR  --  1.1  --   --   --   --   --   --   HEPARINUNFRC  --   --   --   --   --   --   --  0.31  CREATININE 0.77  --   --   --  0.76  --  0.85  --   TROPONINIHS 25*  --  19*  --  768*  --  3,044*  --    < > = values in this interval not displayed.     Estimated Creatinine Clearance: 56 mL/min (by C-G formula based on SCr of 0.85 mg/dL).  Assessment: 71 y.o. female with SOB/VDRF, elevated troponins for heparin  Goal of Therapy:  Heparin level 0.3-0.7 units/ml Monitor platelets by anticoagulation protocol: Yes   Plan:  Continue Heparin at current rate   Geannie Risen, PharmD, BCPS  06/13/2021 3:08 AM

## 2021-06-14 DIAGNOSIS — I5021 Acute systolic (congestive) heart failure: Secondary | ICD-10-CM | POA: Diagnosis not present

## 2021-06-14 DIAGNOSIS — R7989 Other specified abnormal findings of blood chemistry: Secondary | ICD-10-CM

## 2021-06-14 DIAGNOSIS — J9601 Acute respiratory failure with hypoxia: Secondary | ICD-10-CM | POA: Diagnosis not present

## 2021-06-14 LAB — ACID FAST SMEAR (AFB, MYCOBACTERIA)
Acid Fast Smear: NEGATIVE
Acid Fast Smear: NEGATIVE

## 2021-06-14 LAB — CULTURE, BAL-QUANTITATIVE W GRAM STAIN
Culture: NO GROWTH
Culture: NO GROWTH

## 2021-06-14 LAB — CK TOTAL AND CKMB (NOT AT ARMC)
CK, MB: 8.6 ng/mL — ABNORMAL HIGH (ref 0.5–5.0)
Relative Index: INVALID (ref 0.0–2.5)
Total CK: 67 U/L (ref 38–234)

## 2021-06-14 LAB — CBC
HCT: 28.6 % — ABNORMAL LOW (ref 36.0–46.0)
Hemoglobin: 9.7 g/dL — ABNORMAL LOW (ref 12.0–15.0)
MCH: 31.5 pg (ref 26.0–34.0)
MCHC: 33.9 g/dL (ref 30.0–36.0)
MCV: 92.9 fL (ref 80.0–100.0)
Platelets: 352 10*3/uL (ref 150–400)
RBC: 3.08 MIL/uL — ABNORMAL LOW (ref 3.87–5.11)
RDW: 13.8 % (ref 11.5–15.5)
WBC: 13.9 10*3/uL — ABNORMAL HIGH (ref 4.0–10.5)
nRBC: 0 % (ref 0.0–0.2)

## 2021-06-14 LAB — GLUCOSE, CAPILLARY
Glucose-Capillary: 103 mg/dL — ABNORMAL HIGH (ref 70–99)
Glucose-Capillary: 103 mg/dL — ABNORMAL HIGH (ref 70–99)
Glucose-Capillary: 132 mg/dL — ABNORMAL HIGH (ref 70–99)
Glucose-Capillary: 145 mg/dL — ABNORMAL HIGH (ref 70–99)
Glucose-Capillary: 149 mg/dL — ABNORMAL HIGH (ref 70–99)
Glucose-Capillary: 160 mg/dL — ABNORMAL HIGH (ref 70–99)

## 2021-06-14 LAB — BASIC METABOLIC PANEL
Anion gap: 10 (ref 5–15)
BUN: 18 mg/dL (ref 8–23)
CO2: 25 mmol/L (ref 22–32)
Calcium: 7.6 mg/dL — ABNORMAL LOW (ref 8.9–10.3)
Chloride: 106 mmol/L (ref 98–111)
Creatinine, Ser: 0.69 mg/dL (ref 0.44–1.00)
GFR, Estimated: >60 mL/min (ref >60)
Glucose, Bld: 164 mg/dL — ABNORMAL HIGH (ref 70–99)
Potassium: 3.3 mmol/L — ABNORMAL LOW (ref 3.5–5.1)
Sodium: 141 mmol/L (ref 135–145)

## 2021-06-14 LAB — BRAIN NATRIURETIC PEPTIDE: B Natriuretic Peptide: 541.6 pg/mL — ABNORMAL HIGH (ref 0.0–100.0)

## 2021-06-14 LAB — HEPARIN LEVEL (UNFRACTIONATED): Heparin Unfractionated: 0.45 IU/mL (ref 0.30–0.70)

## 2021-06-14 LAB — PHOSPHORUS: Phosphorus: 2.9 mg/dL (ref 2.5–4.6)

## 2021-06-14 MED ORDER — INSULIN ASPART 100 UNIT/ML IJ SOLN
0.0000 [IU] | Freq: Three times a day (TID) | INTRAMUSCULAR | Status: DC
  Administered 2021-06-14 – 2021-06-15 (×3): 2 [IU] via SUBCUTANEOUS

## 2021-06-14 MED ORDER — SPIRONOLACTONE 25 MG PO TABS
25.0000 mg | ORAL_TABLET | Freq: Every day | ORAL | Status: DC
  Administered 2021-06-14: 25 mg
  Filled 2021-06-14: qty 1

## 2021-06-14 MED ORDER — BENZONATATE 100 MG PO CAPS
200.0000 mg | ORAL_CAPSULE | Freq: Two times a day (BID) | ORAL | Status: DC | PRN
  Administered 2021-06-14 – 2021-06-21 (×7): 200 mg via ORAL
  Filled 2021-06-14 (×9): qty 2

## 2021-06-14 MED ORDER — MIDAZOLAM HCL 2 MG/2ML IJ SOLN
INTRAMUSCULAR | Status: AC
  Filled 2021-06-14: qty 2

## 2021-06-14 MED ORDER — POTASSIUM CHLORIDE 20 MEQ PO PACK
40.0000 meq | PACK | Freq: Two times a day (BID) | ORAL | Status: DC
Start: 2021-06-14 — End: 2021-06-14
  Administered 2021-06-14: 40 meq
  Filled 2021-06-14: qty 2

## 2021-06-14 MED ORDER — POTASSIUM & SODIUM PHOSPHATES 280-160-250 MG PO PACK
1.0000 | PACK | Freq: Three times a day (TID) | ORAL | Status: DC
  Administered 2021-06-14 – 2021-06-15 (×4): 1 via ORAL
  Filled 2021-06-14 (×9): qty 1

## 2021-06-14 MED ORDER — ADULT MULTIVITAMIN W/MINERALS CH
1.0000 | ORAL_TABLET | Freq: Every day | ORAL | Status: DC
  Administered 2021-06-14 – 2021-06-22 (×9): 1 via ORAL
  Filled 2021-06-14 (×10): qty 1

## 2021-06-14 MED ORDER — SPIRONOLACTONE 25 MG PO TABS
25.0000 mg | ORAL_TABLET | Freq: Every day | ORAL | Status: DC
  Administered 2021-06-15: 25 mg via ORAL
  Filled 2021-06-14 (×2): qty 1

## 2021-06-14 MED ORDER — THIAMINE HCL 100 MG PO TABS
100.0000 mg | ORAL_TABLET | Freq: Every day | ORAL | Status: DC
  Administered 2021-06-15 – 2021-06-22 (×8): 100 mg via ORAL
  Filled 2021-06-14 (×9): qty 1

## 2021-06-14 MED ORDER — ASPIRIN 81 MG PO CHEW
81.0000 mg | CHEWABLE_TABLET | Freq: Every day | ORAL | Status: DC
  Administered 2021-06-15: 81 mg via ORAL
  Filled 2021-06-14: qty 1

## 2021-06-14 MED ORDER — ACETAMINOPHEN 325 MG PO TABS
650.0000 mg | ORAL_TABLET | Freq: Four times a day (QID) | ORAL | Status: DC | PRN
  Administered 2021-06-18 – 2021-06-21 (×3): 650 mg via ORAL
  Filled 2021-06-14 (×3): qty 2

## 2021-06-14 MED ORDER — FAMOTIDINE 20 MG PO TABS
20.0000 mg | ORAL_TABLET | Freq: Two times a day (BID) | ORAL | Status: AC
  Administered 2021-06-14 – 2021-06-17 (×6): 20 mg via ORAL
  Filled 2021-06-14 (×7): qty 1

## 2021-06-14 MED ORDER — INSULIN ASPART 100 UNIT/ML IJ SOLN
2.0000 [IU] | Freq: Three times a day (TID) | INTRAMUSCULAR | Status: DC
  Administered 2021-06-14 – 2021-06-15 (×3): 2 [IU] via SUBCUTANEOUS

## 2021-06-14 MED ORDER — POTASSIUM CHLORIDE 20 MEQ PO PACK
40.0000 meq | PACK | Freq: Two times a day (BID) | ORAL | Status: AC
  Administered 2021-06-14: 40 meq via ORAL
  Filled 2021-06-14: qty 2

## 2021-06-14 MED ORDER — DOCUSATE SODIUM 50 MG/5ML PO LIQD: 100.0000 mg | Freq: Two times a day (BID) | ORAL | Status: DC | PRN

## 2021-06-14 MED ORDER — ACETAMINOPHEN 650 MG RE SUPP: 650.0000 mg | Freq: Four times a day (QID) | RECTAL | Status: DC | PRN

## 2021-06-14 MED ORDER — POLYETHYLENE GLYCOL 3350 17 G PO PACK: 17.0000 g | PACK | Freq: Every day | ORAL | Status: DC | PRN

## 2021-06-14 MED ORDER — FOLIC ACID 1 MG PO TABS
1.0000 mg | ORAL_TABLET | Freq: Every day | ORAL | Status: DC
  Administered 2021-06-15 – 2021-06-22 (×8): 1 mg via ORAL
  Filled 2021-06-14 (×9): qty 1

## 2021-06-14 NOTE — Consult Note (Addendum)
Cardiology Consultation:   Patient ID: JALON BLACKWELDER MRN: [id number]; DOB: Dec 07, 1950  Admit date: 06/11/2021 Date of Consult: 06/14/2021  PCP:  Merryl Hacker, No   [facility] Providers Cardiologist:  Berniece Salines, DO new:1}     Patient Profile:   Veronica Romero is a 71 y.o. female with a hx of RA on immunosuppressant therapy  and recent COVID-19 infection who is being seen 06/14/2021 for the evaluation of elevated troponin and heart failure at the request of Dr. Carlis Abbott.  History of Present Illness:   Ms. Veronica Romero has no prior cardiac history. 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 She was COVID-19 positive 3-4 weeks ago and did not require hospitalization. Symptoms resolved. She presented to [facility] with onset of dyspnea about 4 days ago along with 2 days of CP. She was also febrile to 102. She was hypoxic on arrival requiring supplemental O2. CT chest consistent with multifocal pneumonia and started on broad spectrum ABX. She was unable to tolerate BiPAP. Due to worsening hypoxia and respiratory distress, she was eventually intubated. Hypotensive after morphine required levophed.   HS troponin 25 --> 19 --> 768 --> 3044 --> 1806 Echocardiogram showed LVEF 25-30% with wall motion abnormality and mild RV dysfunction. Hb 9.7  During my interview, family at bedside helped with history (daughter, sister and BIL). Pt is sedentary at home but she takes care of her husband who is demented. However, at MD appts at the [facility], she walks and denies chest pain. She has a history of chronic lower extremity edema but denies this was worse prior to admission. The present illness started with cough associated with chest pain. Cough worsened and she developed dyspnea prompting evaluation.   She has received IV lasix x 2 doses and was extubated and is now weaning off of supplemental O2. BAL cultures with NGTD.    Past Medical  History:  Diagnosis Date   Rheumatoid arthritis ([place])     History reviewed. No pertinent surgical history.   Home Medications:  Prior to Admission medications   Medication Sig Start Date End Date Taking? Authorizing Provider  Adalimumab (HUMIRA PEN) 40 MG/0.4ML PNKT Inject 0.4 mLs into the skin once a week. Tuesday 04/10/21  Yes [provider]  leucovorin (WELLCOVORIN) 5 MG tablet Take 15 mg by mouth once a week. Saturdays 04/07/21  Yes [provider]  methotrexate 50 MG/2ML injection Inject 50 mg/m2 into the muscle once a week. Saturdays 03/21/21  Yes [provider]  predniSONE (DELTASONE) 5 MG tablet Take 5 mg by mouth daily. 04/12/21  Yes [provider]    Inpatient Medications: Scheduled Meds:  aspirin  81 mg Per Tube Daily   chlorhexidine gluconate (MEDLINE KIT)  15 mL Mouth Rinse BID   Chlorhexidine Gluconate Cloth  6 each Topical Daily   enoxaparin (LOVENOX) injection  1 mg/kg Subcutaneous Q12H   famotidine  20 mg Per Tube BID   folic acid  1 mg Per Tube Daily   furosemide  60 mg Intravenous BID   hydrocortisone sod succinate (SOLU-CORTEF) inj  100 mg Intravenous Q12H   insulin aspart  0-15 Units Subcutaneous TID WC   insulin aspart  2 Units Subcutaneous TID WC   mouth rinse  15 mL Mouth Rinse 10 times per day   multivitamin with minerals  1 tablet Oral Daily   potassium & sodium phosphates  1 packet  Per Tube TID WC & HS   potassium chloride  40 mEq Per Tube BID   spironolactone  25 mg Per Tube Daily   thiamine  100 mg Per Tube Daily   Continuous Infusions:  sodium chloride     azithromycin Stopped (06/13/21 1920)   piperacillin-tazobactam (ZOSYN)  IV 3.375 g (06/14/21 1525)   PRN Meds: acetaminophen **OR** acetaminophen, docusate, levalbuterol, ondansetron (ZOFRAN) IV, polyethylene glycol  Allergies:    Allergies  Allergen Reactions   Penicillins Rash    Other reaction(s): RASH Other reaction(s): RASH     Social  History:   Social History   Socioeconomic History   Marital status: Married    Spouse name: Not on file   Number of children: Not on file   Years of education: Not on file   Highest education level: Not on file  Occupational History   Not on file  Tobacco Use   Smoking status: Never   Smokeless tobacco: Never  Substance and Sexual Activity   Alcohol use: Not Currently   Drug use: Not Currently   Sexual activity: Not on file  Other Topics Concern   Not on file  Social History Narrative   Not on file   Social Determinants of Health   Financial Resource Strain: Not on file  Food Insecurity: Not on file  Transportation Needs: Not on file  Physical Activity: Not on file  Stress: Not on file  Social Connections: Not on file  Intimate Partner Violence: Not on file    Family History:   History reviewed. No pertinent family history.   ROS:  Please see the history of present illness.   All other ROS reviewed and negative.     Physical Exam/Data:   Vitals:   06/14/21 1215 06/14/21 1300 06/14/21 1400 06/14/21 1500  BP: (!) 111/58 (!) 115/58 (!) 117/51 119/60  Pulse: 85 95 84 88  Resp: 19 (!) 22 20 (!) 22  Temp:      TempSrc:      SpO2: 96% 94% 97% 95%  Weight:      Height:        Intake/Output Summary (Last 24 hours) at 06/14/2021 1532 Last data filed at 06/14/2021 1300 Gross per 24 hour  Intake 2765 ml  Output 3040 ml  Net -275 ml   Last 3 Weights 06/14/2021 06/12/2021  Weight (lbs) 166 lb 3.6 oz 143 lb 4.8 oz  Weight (kg) 75.4 kg 65 kg     Body mass index is 26.83 kg/m.  General:  Well nourished, well developed, in no acute distress HEENT: normal Neck: + JVD Cardiac:  normal S1, S2; RRR; no murmur  Lungs:  crackles in bases Abd: soft, nontender, no hepatomegaly  Ext: mild B LE edema - not pitting Musculoskeletal:  No deformities, BUE and BLE strength normal and equal Skin: warm and dry  Neuro:  CNs 2-12 intact, no focal abnormalities noted Psych:   Normal affect   EKG:  The EKG was personally reviewed and demonstrates:  sinus tachycardia with HR 128 Telemetry:  Telemetry was personally reviewed and demonstrates:  sinus rhythm in the 70-80s  Relevant CV Studies:  Echo 06/12/21: 1. Left ventricular ejection fraction, by estimation, is 25 to 30%. The  left ventricle has severely decreased function. The left ventricle  demonstrates regional wall motion abnormalities (see scoring  diagram/findings for description). Left ventricular  diastolic parameters are consistent with Grade I diastolic dysfunction  (impaired relaxation).   2. Right ventricular systolic function  is mildly reduced. The right  ventricular size is normal. There is mildly elevated pulmonary artery  systolic pressure. The estimated right ventricular systolic pressure is  09.6 mmHg.   3. The mitral valve is normal in structure. Trivial mitral valve  regurgitation. No evidence of mitral stenosis.   4. The aortic valve is tricuspid. Aortic valve regurgitation is mild.  Aortic valve sclerosis/calcification is present, without any evidence of  aortic stenosis.   5. The inferior vena cava is dilated in size with <50% respiratory  variability, suggesting right atrial pressure of 15 mmHg.   Laboratory Data:  High Sensitivity Troponin:   Recent Labs  Lab 06/11/21 1645 06/11/21 2229 06/12/21 1115 06/12/21 1627 06/13/21 0553  TROPONINIHS 25* 19* 768* 3,044* 1,806*     Chemistry Recent Labs  Lab 06/12/21 1115 06/12/21 1410 06/12/21 1627 06/13/21 0044 06/13/21 0422 06/13/21 0841 06/14/21 0703  NA 139   < > 137 138 139 140 141  K 3.0*   < > 3.6 3.8 3.7 3.9 3.3*  CL 104  --  105 108  --   --  106  CO2 24  --  22 21*  --   --  25  GLUCOSE 172*  --  163* 192*  --   --  164*  BUN 8  --  10 13  --   --  18  CREATININE 0.76  --  0.85 0.70  --   --  0.69  CALCIUM 7.9*  --  8.0* 8.2*  --   --  7.6*  MG 1.8  --   --  2.7*  --   --   --   GFRNONAA >60  --  >60 >60  --    --  >60  ANIONGAP 11  --  10 9  --   --  10   < > = values in this interval not displayed.    Recent Labs  Lab 06/12/21 1115  PROT 6.7  ALBUMIN 2.8*  AST 116*  ALT 35  ALKPHOS 117  BILITOT 1.3*   Lipids  Recent Labs  Lab 06/13/21 0044  TRIG 66    Hematology Recent Labs  Lab 06/12/21 1115 06/12/21 1410 06/13/21 0044 06/13/21 0422 06/13/21 0841 06/14/21 0703  WBC 23.1*  --  10.7*  --   --  13.9*  RBC 3.27*  --  3.15*  --   --  3.08*  HGB 10.4*   < > 9.8* 8.8* 11.6* 9.7*  HCT 30.8*   < > 29.2* 26.0* 34.0* 28.6*  MCV 94.2  --  92.7  --   --  92.9  MCH 31.8  --  31.1  --   --  31.5  MCHC 33.8  --  33.6  --   --  33.9  RDW 13.6  --  13.6  --   --  13.8  PLT 392  --  296  --   --  352   < > = values in this interval not displayed.   Thyroid No results for input(s): TSH, FREET4 in the last 168 hours.  BNP Recent Labs  Lab 06/12/21 1115  BNP 271.8*    DDimer No results for input(s): DDIMER in the last 168 hours.   Radiology/Studies:  DG Chest 2 View  Result Date: 06/11/2021 CLINICAL DATA:  Chest pain, cough, congestion EXAM: CHEST - 2 VIEW COMPARISON:  None. FINDINGS: Heart size within normal limits. Extensive patchy airspace opacities throughout both lungs. No pleural effusion.  No pneumothorax. Exaggerated thoracic kyphosis. IMPRESSION: Extensive patchy airspace opacities throughout both lungs, suspicious for multifocal pneumonia. Electronically Signed   By: Davina Poke D.O.   On: 06/11/2021 17:39   CT CHEST WO CONTRAST  Result Date: 06/11/2021 CLINICAL DATA:  Recent COVID-19 infection. Current chest radiograph demonstrating airspace lung opacities suspicious for multifocal pneumonia. EXAM: CT CHEST WITHOUT CONTRAST TECHNIQUE: Multidetector CT imaging of the chest was performed following the standard protocol without IV contrast. RADIATION DOSE REDUCTION: This exam was performed according to the departmental dose-optimization program which includes automated  exposure control, adjustment of the mA and/or kV according to patient size and/or use of iterative reconstruction technique. COMPARISON:  Current chest radiograph. FINDINGS: Cardiovascular: Heart normal in size. No pericardial effusion. Left and circumflex coronary artery calcifications. Main pulmonary artery dilated to 3.7 cm. Aorta normal in caliber. Mild aortic atherosclerotic calcifications. Mediastinum/Nodes: Moderate hiatal hernia. Esophagus is unremarkable. Subcentimeter shoddy mediastinal lymph nodes. No enlarged nodes. No mediastinal or hilar masses. Trachea is unremarkable. Lungs/Pleura: Patchy bilateral ground-glass lung opacities consistent with multifocal pneumonia, atypical etiologies including COVID-19 infection in the differential diagnosis. No evidence of pulmonary edema. No pleural effusion or pneumothorax. Upper Abdomen: No acute abnormality. Musculoskeletal: Moderate compression fracture of T11. Mild compression fracture of T12. These are of unclear chronicity, but appear chronic. No other fractures. No bone lesions. IMPRESSION: 1. Numerous bilateral patchy areas of ground-glass opacity consistent with multifocal pneumonia. 2. Mild dilation of the main pulmonary artery suggesting pulmonary artery hypertension. 3. Aortic atherosclerosis. 4. Compression fractures of T11 and T12, most likely chronic. 5. Coronary artery calcifications. Aortic Atherosclerosis (ICD10-I70.0). Electronically Signed   By: Lajean Manes M.D.   On: 06/11/2021 20:31   CT Angio Chest PE W/Cm &/Or Wo Cm  Result Date: 06/11/2021 CLINICAL DATA:  Chest pain and shortness of breath. EXAM: CT ANGIOGRAPHY CHEST WITH CONTRAST TECHNIQUE: Multidetector CT imaging of the chest was performed using the standard protocol during bolus administration of intravenous contrast. Multiplanar CT image reconstructions and MIPs were obtained to evaluate the vascular anatomy. RADIATION DOSE REDUCTION: This exam was performed according to the  departmental dose-optimization program which includes automated exposure control, adjustment of the mA and/or kV according to patient size and/or use of iterative reconstruction technique. CONTRAST:  46m OMNIPAQUE IOHEXOL 350 MG/ML SOLN COMPARISON:  PA and lateral chest and noncontrast chest CT earlier today are the only chest priors FINDINGS: Cardiovascular: The heart is slightly enlarged. There is no pericardial effusion. There are calcifications in the left main, lad and circumflex coronary arteries. The aorta is normal in caliber with mild descending tortuosity and with mild patchy calcific atherosclerosis. No dissection or aneurysm is seen. There are nonstenosing calcifications in the left subclavian artery. The great vessels otherwise unremarkable. Prominent pulmonary trunk again noted measuring 3.4 cm indicating arterial hypertension, but there is no right heart chamber expansion, IVC reflux or appreciable arterial embolic filling defect. Central pulmonary veins are slightly distended. Mediastinum/Nodes: Visualized thyroid gland is unremarkable. Axillary spaces are clear. A moderate-sized hiatal hernia is again seen with unremarkable thoracic esophagus. There are mildly prominent scattered right hilar lymph nodes up to 1.3 cm in short axis, few mildly prominent right hilar nodes up to 1.2 cm in short axis, and subcarinal mediastinal nodes also up to 1.2 cm in short axis. There is no further adenopathy.  The trachea is clear. Lungs/Pleura: Again noted are extensive bilateral interstitial and ground-glass infiltrates, widespread throughout both lungs, with a central and peripheral predominance, most likely indicating extensive  pneumonia. There are trace pleural effusions. No pneumothorax or pneumomediastinum. Upper Abdomen: No acute abnormality. Mild eventration and elevation right hemidiaphragm. Calcified granulomas in the spleen. Splenic artery is heavily calcified. Musculoskeletal: Chronic appearing  compression fractures of the T11 and T12 vertebral bodies are again noted with exaggerated thoracic kyphosis, mild osteopenia, and degenerative change of the spine. Hemangiomatous replacement of much of the T8 vertebral body is incidentally seen. There is a chronic trauma deformity of the proximal body of the sternum. The ribcage is intact. Review of the MIP images confirms the above findings. IMPRESSION: 1. Prominent pulmonary trunk without evidence of arterial emboli or acute right heart strain. 2. Extensive interstitial and ground-glass infiltrates throughout the lungs, predominating in the center and periphery compatible with multifocal pneumonia including viral and atypical etiologies such as COVID pneumonia. Has the patient been tested? Underlying edema component possible but believed unlikely. There are trace pleural effusions. 3. Mildly prominent hilar and mediastinal lymph nodes. Follow-up study recommended after treatment or PET-CT. 4. Slightly prominent heart and central pulmonary veins, with aortic and coronary artery atherosclerosis. 5. Moderate-sized hiatal hernia. 6. Chronic appearing compression fractures of the T11 and T12 vertebral bodies. Electronically Signed   By: Telford Nab M.D.   On: 06/11/2021 22:03   DG CHEST PORT 1 VIEW  Result Date: 06/12/2021 CLINICAL DATA:  Post bronchial filler lavage EXAM: PORTABLE CHEST 1 VIEW COMPARISON:  Portable exam 1637 hours compared to 0908 hours FINDINGS: Tip of endotracheal tube projects 4.4 cm above carina. Feeding tube extends into stomach. Stable heart size and mediastinal contours. BILATERAL pulmonary infiltrates, slightly improved. No pleural effusion or pneumothorax. Skin folds project over LEFT upper lobe. IMPRESSION: Diffuse BILATERAL pulmonary infiltrates, slightly improved. Electronically Signed   By: Lavonia Dana M.D.   On: 06/12/2021 16:54   DG Chest Portable 1 View  Result Date: 06/12/2021 CLINICAL DATA:  Evaluate ET tube placement  EXAM: PORTABLE CHEST 1 VIEW COMPARISON:  06/11/2021 FINDINGS: The ET tube tip is above the carina. Enteric tube courses below the level of the hemidiaphragms with side port approximately 6 cm below the GE junction. Stable cardiomediastinal contours. There is been significant interval increase in bilateral interstitial and airspace opacities involving the upper and lower lung zones. The visualized osseous structures appear intact. IMPRESSION: 1. Satisfactory position of ET tube. 2. Significant interval increase in bilateral interstitial and airspace opacities involving the upper and lower lung zones compatible with worsening pulmonary edema versus multifocal infection. Electronically Signed   By: Kerby Moors M.D.   On: 06/12/2021 09:22   DG Abd Portable 1V  Result Date: 06/12/2021 CLINICAL DATA:  Feeding tube placement EXAM: PORTABLE ABDOMEN - 1 VIEW COMPARISON:  None. FINDINGS: Feeding tube extends the stomach. Tip at the gastric antrum. Gas-filled loops of small bowel distension. Bibasilar airspace disease. IMPRESSION: Feeding tube with tip at the gastric antrum. Electronically Signed   By: Suzy Bouchard M.D.   On: 06/12/2021 11:56   ECHOCARDIOGRAM COMPLETE  Result Date: 06/12/2021    ECHOCARDIOGRAM REPORT   Patient Name:   MARGART ZEMANEK Date of Exam: 06/12/2021 Medical Rec #:  [id number]       Height:       66.0 in Accession #:    [id number]      Weight:       143.3 lb Date of Birth:  [birth year]      BSA:          1.736 m Patient Age:    29 years  BP:           198/80 mmHg Patient Gender: F               HR:           87 bpm. Exam Location:  Inpatient Procedure: 2D Echo, Cardiac Doppler, Color Doppler and Intracardiac            Opacification Agent  Results communicated to Dr Tacy Learn at 18:00 on 06/12/21. Indications:    Respiratory distress  History:        Patient has no prior history of Echocardiogram examinations.                 Signs/Symptoms:Chest Pain and Dyspnea.  Sonographer:    Jyl Heinz Referring Phys: [place]  1. Left ventricular ejection fraction, by estimation, is 25 to 30%. The left ventricle has severely decreased function. The left ventricle demonstrates regional wall motion abnormalities (see scoring diagram/findings for description). Left ventricular diastolic parameters are consistent with Grade I diastolic dysfunction (impaired relaxation).  2. Right ventricular systolic function is mildly reduced. The right ventricular size is normal. There is mildly elevated pulmonary artery systolic pressure. The estimated right ventricular systolic pressure is 20.2 mmHg.  3. The mitral valve is normal in structure. Trivial mitral valve regurgitation. No evidence of mitral stenosis.  4. The aortic valve is tricuspid. Aortic valve regurgitation is mild. Aortic valve sclerosis/calcification is present, without any evidence of aortic stenosis.  5. The inferior vena cava is dilated in size with <50% respiratory variability, suggesting right atrial pressure of 15 mmHg. FINDINGS  Left Ventricle: Left ventricular ejection fraction, by estimation, is 25 to 30%. The left ventricle has severely decreased function. The left ventricle demonstrates regional wall motion abnormalities. The left ventricular internal cavity size was normal  in size. There is no left ventricular hypertrophy. Left ventricular diastolic parameters are consistent with Grade I diastolic dysfunction (impaired relaxation).  LV Wall Scoring: The mid and distal anterior wall, entire anterior septum, entire apex, mid and distal inferior wall, and mid inferoseptal segment are akinetic. The antero-lateral wall, posterior wall, basal anterior segment, basal inferior segment, and basal inferoseptal segment are normal. Right Ventricle: The right ventricular size is normal. No increase in right ventricular wall thickness. Right ventricular systolic function is mildly reduced. There is mildly elevated pulmonary artery  systolic pressure. The tricuspid regurgitant velocity  is 2.41 m/s, and with an assumed right atrial pressure of 15 mmHg, the estimated right ventricular systolic pressure is 54.2 mmHg. Left Atrium: Left atrial size was normal in size. Right Atrium: Right atrial size was normal in size. Pericardium: There is no evidence of pericardial effusion. Mitral Valve: The mitral valve is normal in structure. Trivial mitral valve regurgitation. No evidence of mitral valve stenosis. Tricuspid Valve: The tricuspid valve is normal in structure. Tricuspid valve regurgitation is mild. Aortic Valve: The aortic valve is tricuspid. Aortic valve regurgitation is mild. Aortic regurgitation PHT measures 448 msec. Aortic valve sclerosis/calcification is present, without any evidence of aortic stenosis. Aortic valve peak gradient measures 6.2  mmHg. Pulmonic Valve: The pulmonic valve was not well visualized. Pulmonic valve regurgitation is trivial. Aorta: The aortic root and ascending aorta are structurally normal, with no evidence of dilitation. Venous: The inferior vena cava is dilated in size with less than 50% respiratory variability, suggesting right atrial pressure of 15 mmHg. IAS/Shunts: The interatrial septum was not well visualized.  LEFT VENTRICLE PLAX 2D LVIDd:  5.00 cm      Diastology LVIDs:         3.60 cm      LV e' medial:    3.81 cm/s LV PW:         0.90 cm      LV E/e' medial:  12.8 LV IVS:        0.90 cm      LV e' lateral:   5.87 cm/s LVOT diam:     2.00 cm      LV E/e' lateral: 8.3 LV SV:         54 LV SV Index:   31 LVOT Area:     3.14 cm  LV Volumes (MOD) LV vol d, MOD A2C: 137.0 ml LV vol d, MOD A4C: 110.0 ml LV vol s, MOD A2C: 97.6 ml LV vol s, MOD A4C: 80.2 ml LV SV MOD A2C:     39.4 ml LV SV MOD A4C:     110.0 ml LV SV MOD BP:      34.2 ml RIGHT VENTRICLE            IVC RV Basal diam:  3.40 cm    IVC diam: 2.40 cm RV Mid diam:    2.30 cm RV S prime:     8.05 cm/s TAPSE (M-mode): 1.3 cm LEFT ATRIUM              Index        RIGHT ATRIUM           Index LA diam:        3.60 cm 2.07 cm/m   RA Area:     12.40 cm LA Vol (A2C):   54.5 ml 31.40 ml/m  RA Volume:   32.40 ml  18.67 ml/m LA Vol (A4C):   54.5 ml 31.40 ml/m LA Biplane Vol: 56.8 ml 32.73 ml/m  AORTIC VALVE AV Area (Vmax): 2.26 cm AV Vmax:        125.00 cm/s AV Peak Grad:   6.2 mmHg LVOT Vmax:      90.10 cm/s LVOT Vmean:     70.800 cm/s LVOT VTI:       0.171 m AI PHT:         448 msec  AORTA Ao Root diam: 3.20 cm Ao Asc diam:  2.70 cm MITRAL VALVE               TRICUSPID VALVE MV Area (PHT): 6.17 cm    TR Peak grad:   23.2 mmHg MV Decel Time: 123 msec    TR Vmax:        241.00 cm/s MV E velocity: 48.80 cm/s MV A velocity: 78.40 cm/s  SHUNTS MV E/A ratio:  0.62        Systemic VTI:  0.17 m                            Systemic Diam: 2.00 cm Oswaldo Milian MD Electronically signed by Oswaldo Milian MD Signature Date/Time: 06/12/2021/6:06:23 PM    Final      Assessment and Plan:   Acute systolic heart failure Mild RV dysfunction Elevated troponin - echo with LVEF 25-30% with WMA - hs troponin trended to 3044 --> 1806 - EKG does not appear ischemic, but with sinus tachycardia - she is diuresing on lasix 60 mg IV BID and 25 mg spironolactone - she is diuresing well with 2.8 L urine output yesterday  and 1L urine output today so far - weight 143 --> 166 will request repeat weight - will continue to diurese, agree with current IV lasix - will plan to diurese tomorrow and plan for potential right and left heart cath on Friday - agree with ASA, will hold BB until more euvolemic - on spironolactone, consider starting entresto pending her BP   Acute hypoxic respiratory failure ARDS - she was extubated to [place] today - ABX per primary - has been extubated, now on [place]   RA - continue methotrexate, humira, continue prednisone, per primary - diagnosed at age 57 - increases risk for CAD    Risk Assessment/Risk Scores:    [place]  [employer] (NYHA) Functional Class NYHA Class IV        For questions or updates, please contact [facility] Please consult [email or web address] for contact info under    Signed, Ledora Bottcher, [place]  06/14/2021 3:32 PM   Patient seen and examined, note reviewed with the signed Advanced Practice Provider. I personally reviewed laboratory data, imaging studies and relevant notes. I independently examined the patient and formulated the important aspects of the plan. I have personally discussed the plan with the patient and/or family. Comments or changes to the note/plan are indicated below.  Patient was seen examined at her bedside in the medical ICU.  Her family was also by the bedside.  We discussed extensively about her current clinical state.  Including her significantly increased troponin which is trending down, her newly depressed ejection fraction with wall motion abnormalities.  And also her exacerbation of heart failure which likely is contributing to her acute respiratory failure.  For now she needs to be diuresed we will continue her current diuretics as she is responding repeatedly.  She will need an ischemic evaluation given her risk factors and current clinical findings.  I explained to the patient and her family members in the room.  We will plan for heart catheterization on Friday hopefully she would have had adequate diuretics at that time with improved respiratory status.   CRITICAL CARE Performed by: Berniece Salines  Total critical care time: 50 minutes. Critical care time was exclusive of separately billable procedures and treating other patients. Critical care was necessary to treat or prevent imminent or life-threatening deterioration. Critical care was time spent personally by me on the following activities: development of treatment plan with patient and/or surrogate as well as nursing, discussions with consultants, evaluation of patient's response to treatment,  examination of patient, obtaining history from patient or surrogate, ordering and performing treatments and interventions, ordering and review of laboratory studies, ordering and review of radiographic studies, pulse oximetry and re-evaluation of patient's condition.    Signed, Berniece Salines, DO [place]  06/14/2021 5:48 PM     Berniece Salines DO, MS [facility] Attending Cardiologist [place]  [address] [place],  [address] [phone] Website: [email or web address]

## 2021-06-14 NOTE — Procedures (Signed)
Extubation Procedure Note  Patient Details:   Name: Veronica Romero DOB: 1950-12-20 MRN: [id number]   Airway Documentation:    Vent end date: 06/14/21 Vent end time: 0932   Evaluation  O2 sats: stable throughout Complications: No apparent complications Patient did tolerate procedure well. Bilateral Breath Sounds: Diminished, Clear   Yes  Patient was extubated to HFNC 8L without any complications, dyspnea, or stridor noted. Positive cuff leak prior to extubation. Patient was instructed on IS.   Macy Mis 06/14/2021, 9:44 AM

## 2021-06-14 NOTE — Progress Notes (Signed)
NAME:  Veronica Romero, MRN:  [id number], DOB:  03/09/1951, LOS:  ADMISSION DATE:  06/11/2021, CONSULTATION DATE:  06/12/2021 REFERRING MD: Benny Lennert  CHIEF COMPLAINT:  Acute Respiratory Failure   History of Present Illness:  71 yo F PMH RA on methotrexate, prednisone, humira, recent COVID-19 infection with minimal sx presented to [facility] 2/12 with SOB and cough. Patient had an intermittent cough following COVID-19 infection, however 4 days prior to ED presentation coughing became more severe.   Overnight 2/12, pt had increasing O2 requirement, ultimately requiring BiPAP for associated increased work of breathing. On 2/13 patient's respiratory status continued to decline and PCCM was consulted urgently for evaluation of respiratory distress.   Pertinent  Medical History  RA on chronic immunosuppression  Significant Hospital Events: Including procedures, antibiotic start and stop dates in addition to other pertinent events   2/12 ED presentation for SOB, cough, hypoxia. Admit to [facility]. Azithro, rocephin. CTA neg for PE. Incr WOB requiring BiPAP 2/13 Worsening respiratory status. PCCM consulted for resp distress. Intubated. Transfer to ICU, bronchoscopy completed with BAL sent. Abx switched to Azitho, zosyn 2/14 Remained intubated and sedated   Interim History / Subjective:  Patient responds to commands, remains intubated. Daughter at bedside.  Objective   Blood pressure (!) 125/49, pulse 63, temperature 97.7 F (36.5 C), temperature source Axillary, resp. rate (!) 25, height _0  (1.676 m), weight 75.4 kg, SpO2 97 %.    Vent Mode: PRVC FiO2 (%):  [40 %-50 %] 40 % Set Rate:  [25 bmp] 25 bmp Vt Set:  [450 mL] 450 mL PEEP:  [5 cmH20-8 cmH20] 5 cmH20 Plateau Pressure:  [19 cmH20-24 cmH20] 20 cmH20   Intake/Output Summary (Last 24 hours) at 06/14/2021 3888 Last data filed at 06/14/2021 0500 Gross per 24 hour  Intake 3108.57 ml  Output 2765 ml  Net 343.57 ml    Filed Weights   06/12/21 0926  06/14/21 0333  Weight: 65 kg 75.4 kg    Examination: Constitutional: elderly female, intubated, opens eyes to voice, follows commands HENT: normocephalic atraumatic, mucous membranes moist Eyes: conjunctiva non-erythematous Neck: supple Cardiovascular: regular rate and rhythm, no m/r/g Pulmonary/Chest: intubated, lungs clear to auscultation bilaterally, no wheezing or rhonchi appreciated Abdominal: soft, non-tender, non-distended MSK: normal bulk and tone Neurological: sedated Skin: warm and dry  PRVC/ 25/ 450/ 5/ 40 K 3.3 WBC 10.7-> 13.9 CBC 11.6-> 9.7 Net + 236 in last 24 hours, 100 mg Lasix Acid fast smear negative  Resolved Hospital Problem list   Shock  Assessment & Plan:  Acute Hypoxic Respiratory Failure 2/2 Community Acquired Pneumonia ARDS Immunocompromised Patient >> humira, prednisone,  and methotrexate/leukovorin  Leukocytosis, on hydrocortisone WBC 10.7-> 13.9, patient is taking hydrocortisone. - ARDS P:F 176 2/14 - BAL culture ngtd, acid fast smear negative - Continue Zosyn q 8 hrs day 3, Azithromycin day 4 - blood cultures ngtd - Levalbuterol - hydrocortisone 100 mg  - PAD protocol for sedation, VAP prevention protocol - holding immunosuppression - daily SAT and SBT   Elevated troponins Acute non-ST elevation MI Acute HFrEF, NSTEMI vs takotsubo Echo showed EF 25-30% with severely decreased function of left ventricle.  Right ventricular systolic function is mildly reduced, mildly elevated pulmonary artery pressure, 38.2 mmHg. Will hold off on consulting cardiology until more stable. -Start spironolactone 25 mg qd -Continue lovenox BID -ASA -Consulting cardiology now that more stable - tele - keep Mg >2.0, K > 4.0  Rheumatoid Arthritis treated with Methotrexate /Humira/prednisone - hold immunosuppressive therapy -  Continue hydrocortisone 100 mg - Propofol and Fentanyl for pain management while intubated  Hyperglycemia BGs 150-200s. Patient  receiving steroids. She has required  units novolog in past 24 hours. -CBGs q 4 hrs -SSI PRN - 2 units novolog for tube feed coverage with hold parameters  Hypophosphatemia Phos 2.3 -K phos  Normocytic anemia Hgb 9.8, MCV 92.7. Hgb stable, will consider further work-up once patient more stable.  Best Practice (right click and "Reselect all SmartList Selections" daily)   Diet/type: NPO DVT prophylaxis: systemic heparin GI prophylaxis: H2B Lines: N/A Foley:  Yes, and it is still needed Code Status:  full code Last date of multidisciplinary goals of care discussion [daughter updated at bedside 2/15]  Veronica Romero, D.O.  Internal Medicine Resident, PGY-1 [facility]  Pager: [phone] 6:05 AM, 06/14/2021

## 2021-06-15 DIAGNOSIS — J181 Lobar pneumonia, unspecified organism: Secondary | ICD-10-CM | POA: Diagnosis not present

## 2021-06-15 DIAGNOSIS — D849 Immunodeficiency, unspecified: Secondary | ICD-10-CM | POA: Diagnosis not present

## 2021-06-15 DIAGNOSIS — I214 Non-ST elevation (NSTEMI) myocardial infarction: Secondary | ICD-10-CM

## 2021-06-15 DIAGNOSIS — I5021 Acute systolic (congestive) heart failure: Secondary | ICD-10-CM | POA: Diagnosis not present

## 2021-06-15 DIAGNOSIS — J9601 Acute respiratory failure with hypoxia: Secondary | ICD-10-CM | POA: Diagnosis not present

## 2021-06-15 LAB — BASIC METABOLIC PANEL
Anion gap: 10 (ref 5–15)
BUN: 14 mg/dL (ref 8–23)
CO2: 31 mmol/L (ref 22–32)
Calcium: 7.8 mg/dL — ABNORMAL LOW (ref 8.9–10.3)
Chloride: 102 mmol/L (ref 98–111)
Creatinine, Ser: 0.66 mg/dL (ref 0.44–1.00)
GFR, Estimated: >60 mL/min (ref >60)
Glucose, Bld: 132 mg/dL — ABNORMAL HIGH (ref 70–99)
Potassium: 3 mmol/L — ABNORMAL LOW (ref 3.5–5.1)
Sodium: 143 mmol/L (ref 135–145)

## 2021-06-15 LAB — MAGNESIUM: Magnesium: 2.1 mg/dL (ref 1.7–2.4)

## 2021-06-15 LAB — GLUCOSE, CAPILLARY
Glucose-Capillary: 134 mg/dL — ABNORMAL HIGH (ref 70–99)
Glucose-Capillary: 129 mg/dL — ABNORMAL HIGH (ref 70–99)
Glucose-Capillary: 123 mg/dL — ABNORMAL HIGH (ref 70–99)
Glucose-Capillary: 86 mg/dL (ref 70–99)

## 2021-06-15 LAB — HEPARIN LEVEL (UNFRACTIONATED): Heparin Unfractionated: 0.79 IU/mL — ABNORMAL HIGH (ref 0.30–0.70)

## 2021-06-15 LAB — CBC
HCT: 27.2 % — ABNORMAL LOW (ref 36.0–46.0)
Hemoglobin: 9.3 g/dL — ABNORMAL LOW (ref 12.0–15.0)
MCH: 31.4 pg (ref 26.0–34.0)
MCHC: 34.2 g/dL (ref 30.0–36.0)
MCV: 91.9 fL (ref 80.0–100.0)
Platelets: 357 10*3/uL (ref 150–400)
RBC: 2.96 MIL/uL — ABNORMAL LOW (ref 3.87–5.11)
RDW: 13.7 % (ref 11.5–15.5)
WBC: 11.8 10*3/uL — ABNORMAL HIGH (ref 4.0–10.5)
nRBC: 0 % (ref 0.0–0.2)

## 2021-06-15 LAB — FUNGITELL, SERUM: Fungitell Result: 33 pg/mL (ref <80)

## 2021-06-15 MED ORDER — ORAL CARE MOUTH RINSE
15.0000 mL | Freq: Two times a day (BID) | OROMUCOSAL | Status: DC
  Administered 2021-06-15 – 2021-06-22 (×8): 15 mL via OROMUCOSAL

## 2021-06-15 MED ORDER — PREDNISONE 10 MG PO TABS
5.0000 mg | ORAL_TABLET | Freq: Every day | ORAL | Status: DC
  Administered 2021-06-17 – 2021-06-22 (×6): 5 mg via ORAL
  Filled 2021-06-15 (×6): qty 1

## 2021-06-15 MED ORDER — FUROSEMIDE 10 MG/ML IJ SOLN
40.0000 mg | Freq: Once | INTRAMUSCULAR | Status: AC
  Administered 2021-06-15: 40 mg via INTRAVENOUS
  Filled 2021-06-15: qty 4

## 2021-06-15 MED ORDER — POTASSIUM CHLORIDE CRYS ER 20 MEQ PO TBCR
40.0000 meq | EXTENDED_RELEASE_TABLET | ORAL | Status: AC
  Administered 2021-06-15 (×2): 40 meq via ORAL
  Filled 2021-06-15 (×2): qty 2

## 2021-06-15 MED ORDER — INSULIN ASPART 100 UNIT/ML IJ SOLN
0.0000 [IU] | Freq: Three times a day (TID) | INTRAMUSCULAR | Status: DC
  Administered 2021-06-16 – 2021-06-17 (×3): 1 [IU] via SUBCUTANEOUS
  Administered 2021-06-17 – 2021-06-18 (×2): 2 [IU] via SUBCUTANEOUS

## 2021-06-15 MED ORDER — POTASSIUM CHLORIDE CRYS ER 20 MEQ PO TBCR
30.0000 meq | EXTENDED_RELEASE_TABLET | Freq: Once | ORAL | Status: AC
  Administered 2021-06-15: 30 meq via ORAL
  Filled 2021-06-15: qty 1

## 2021-06-15 MED ORDER — ENOXAPARIN SODIUM 80 MG/0.8ML IJ SOSY: 1.0000 mg/kg | PREFILLED_SYRINGE | Freq: Two times a day (BID) | INTRAMUSCULAR | Status: DC

## 2021-06-15 MED ORDER — ENSURE ENLIVE PO LIQD
237.0000 mL | Freq: Two times a day (BID) | ORAL | Status: DC
  Administered 2021-06-15 – 2021-06-16 (×2): 237 mL via ORAL

## 2021-06-15 MED ORDER — HYDROCORTISONE SOD SUC (PF) 100 MG IJ SOLR
50.0000 mg | Freq: Two times a day (BID) | INTRAMUSCULAR | Status: AC
  Administered 2021-06-15 – 2021-06-17 (×4): 50 mg via INTRAVENOUS
  Filled 2021-06-15 (×4): qty 2

## 2021-06-15 MED ORDER — SPIRONOLACTONE 12.5 MG HALF TABLET
12.5000 mg | ORAL_TABLET | Freq: Every day | ORAL | Status: DC
  Administered 2021-06-16 – 2021-06-22 (×7): 12.5 mg via ORAL
  Filled 2021-06-15 (×8): qty 1

## 2021-06-15 MED ORDER — ASPIRIN EC 81 MG PO TBEC
81.0000 mg | DELAYED_RELEASE_TABLET | Freq: Every day | ORAL | Status: DC
  Administered 2021-06-16 – 2021-06-22 (×7): 81 mg via ORAL
  Filled 2021-06-15 (×7): qty 1

## 2021-06-15 MED ORDER — INSULIN ASPART 100 UNIT/ML IJ SOLN: 2.0000 [IU] | Freq: Three times a day (TID) | INTRAMUSCULAR | Status: DC

## 2021-06-15 NOTE — Evaluation (Signed)
Physical Therapy Evaluation Patient Details Name: Veronica Romero MRN: [id number] DOB: [birth year] Today's Date: 06/15/2021  History of Present Illness  Pt is a 71 y/o female admitted due to multifocal PNA. Due to worsening respiration status, pt intubated 2/13. Extubated 2/15. PMH: recent COVID-19 infection, RA  Clinical Impression  PTA pt living in level entry, single level apartment with husband with dementia. Pt used to be husbands primary caregiver but now has aides that assist 24/7. Pt was completely independent prior to COVID-19 infection. Pt is currently limited in safe mobility by increased O2 demand,and fatigue from coughing, in presence of generalized weakness. Pt is supervision for bed mobility and min A for transfers. Unable to progress ambulation due to fatigue. PT recommending HHPT at discharge to improve strength and stamina. PT will continue to follow acutely.     Recommendations for follow up therapy are one component of a multi-disciplinary discharge planning process, led by the attending physician.  Recommendations may be updated based on patient status, additional functional criteria and insurance authorization.  Follow Up Recommendations Home health PT    Assistance Recommended at Discharge Intermittent Supervision/Assistance  Patient can return home with the following  Assistance with cooking/housework;Assistance with feeding;Assist for transportation;Help with stairs or ramp for entrance;A little help with bathing/dressing/bathroom;A little help with walking and/or transfers    Equipment Recommendations Rolling walker (2 wheels)  Recommendations for Other Services       Functional Status Assessment Patient has had a recent decline in their functional status and demonstrates the ability to make significant improvements in function in a reasonable and predictable amount of time.     Precautions / Restrictions Precautions Precautions: Fall;Other (comment) Precaution  Comments: flexiseal, monitor O2 (does not wear at baseline) Restrictions Weight Bearing Restrictions: No      Mobility  Bed Mobility Overal bed mobility: Needs Assistance Bed Mobility: Supine to Sit     Supine to sit: Supervision, HOB elevated     General bed mobility comments: increased time, assist for lines    Transfers Overall transfer level: Needs assistance Equipment used: Rolling walker (2 wheels) Transfers: Sit to/from Stand, Bed to chair/wheelchair/BSC Sit to Stand: Min assist   Step pivot transfers: Min assist, +2 safety/equipment       General transfer comment: MIn A for power up, posterior bias in stepping to [facility] and recliner afterwards. cues for hand placement with improved power up    Ambulation/Gait               General Gait Details: unable to progress to ambulation due to fatigue from increased coughing    Balance Overall balance assessment: Needs assistance Sitting-balance support: No upper extremity supported, Feet supported Sitting balance-Leahy Scale: Fair     Standing balance support: Bilateral upper extremity supported, During functional activity Standing balance-Leahy Scale: Poor Standing balance comment: reliant on UE support in standing                             Pertinent Vitals/Pain Pain Assessment Pain Assessment: No/denies pain    Home Living Family/patient expects to be discharged to:: Private residence Living Arrangements: Spouse/significant other Available Help at Discharge: Family Type of Home: Apartment Home Access: Level entry       Home Layout: One level Home Equipment: Agricultural consultant (2 wheels);Cane - single point;Shower seat Additional Comments: DME at home for husband    Prior Function Prior Level of Function : Independent/Modified Independent;Driving  Mobility Comments: no use of AD typically ADLs Comments: Independent in all ADLs, IADLs, grocery shopping. 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 pt reports difficulty holding cups (improved with use of cup with handle) RUE Coordination: decreased fine motor LUE Deficits / Details: hx of RA, arthritic deformities. ulnar drift LUE Coordination: decreased fine motor    Lower Extremity Assessment Lower Extremity Assessment: RLE deficits/detail;LLE deficits/detail RLE Deficits / Details: hx of RA, ROM WFL, strength grossly 3+/5 LLE Deficits / Details: hx of RA, ROM WFL, strength grossly 3+/5    Cervical / Trunk Assessment Cervical / Trunk Assessment: Kyphotic  Communication   Communication: No difficulties  Cognition Arousal/Alertness: Awake/alert Behavior During Therapy: WFL for tasks assessed/performed Overall Cognitive Status: Within Functional Limits for tasks assessed                                 General Comments: very pleasant, participatory        General Comments General comments (skin integrity, edema, etc.): Pt on 6 L HFNC on entry at rest 100%O2, dropped to 85%O2 with moving and increased coughing in upright, VSS        Assessment/Plan    PT Assessment Patient needs continued PT services  PT Problem List Decreased strength;Decreased activity tolerance;Decreased balance;Decreased mobility;Cardiopulmonary status limiting activity       PT Treatment Interventions DME instruction;Gait training;Functional mobility training;Therapeutic activities;Therapeutic exercise;Balance training;Cognitive remediation;Patient/family education    PT Goals (Current goals can be found in the Care Plan section)  Acute Rehab PT Goals Patient Stated Goal: get Flexiseal  removed PT Goal Formulation: With patient Time For Goal Achievement: 06/29/21 Potential to Achieve Goals: Good    Frequency Min 3X/week     Co-evaluation PT/OT/SLP Co-Evaluation/Treatment: Yes Reason for Co-Treatment: For patient/therapist safety;To address functional/ADL transfers PT goals addressed during session: Mobility/safety with mobility OT goals addressed during session: ADL's and self-care       AM-PAC PT "6 Clicks" Mobility  Outcome Measure Help needed turning from your back to your side while in a flat bed without using bedrails?: None Help needed moving from lying on your back to sitting on the side of a flat bed without using bedrails?: None Help needed moving to and from a bed to a chair (including a wheelchair)?: A Little Help needed standing up from a chair using your arms (e.g., wheelchair or bedside chair)?: A Little Help needed to walk in hospital room?: Total Help needed climbing 3-5 steps with a railing? : Total 6 Click Score: 16    End of Session Equipment Utilized During Treatment: Gait belt Activity Tolerance: Patient tolerated treatment well Patient left: in chair;with call bell/phone within reach;with chair alarm set Nurse Communication: Mobility status;Other (comment) (fatigue from increased coughing and request for Flexiseal removal) PT Visit Diagnosis: Unsteadiness on feet (R26.81);Other abnormalities of gait and mobility (R26.89);Muscle weakness (generalized) (M62.81);Difficulty in walking, not elsewhere classified (R26.2)    Time: [id number] PT Time Calculation (min) (ACUTE ONLY): 31 min   Charges:   PT Evaluation $PT Eval Moderate Complexity: 1 Mod          Layni Kreamer B[employer]  PT, DPT Acute Rehabilitation Services Pager [phone] Office [phone]   [place] 06/15/2021, 11:54 AM

## 2021-06-15 NOTE — Progress Notes (Signed)
Pharmacy Antibiotic Note  Veronica Romero is a 71 y.o. female admitted on 06/11/2021 presenting with respiratory distress, intubated in ED, concern for aspiration.  Pharmacy has been consulted for Zosyn dosing.  Plan: Continue Zosyn 3.375g IV q 8 hours (extended infusion) Renal function stable Pharmacy will sign off  Height: 5\' 6"  (167.6 cm) Weight: 75.4 kg (166 lb 3.6 oz) IBW/kg (Calculated) : 59.3  Temp (24hrs), Avg:97.7 F (36.5 C), Min:96.8 F (36 C), Max:98.2 F (36.8 C)  Recent Labs  Lab 06/11/21 1645 06/11/21 1800 06/12/21 1115 06/12/21 1627 06/13/21 0044 06/14/21 0703 06/15/21 0309  WBC 9.8  --  23.1*  --  10.7* 13.9* 11.8*  CREATININE 0.77  --  0.76 0.85 0.70 0.69 0.66  LATICACIDVEN  --  0.9 1.4  --   --   --   --      Estimated Creatinine Clearance: 65.9 mL/min (by C-G formula based on SCr of 0.66 mg/dL).    Allergies  Allergen Reactions   Penicillins Rash    Other reaction(s): RASH Other reaction(s): RASH     Lestine Box, PharmD PGY2 Infectious Diseases Pharmacy Resident   Please check [email or web address] for unit-specific pharmacy phone numbers

## 2021-06-15 NOTE — Progress Notes (Signed)
Pt has not signed consent for cardiac cath, requesting to speak with provider regarding procedure risks before signing.

## 2021-06-15 NOTE — H&P (Signed)
Acute systolic HF in DM II, PNA, RA, Htn,  Acute hypoxic resp failure Renal function okay.

## 2021-06-15 NOTE — H&P (View-Only) (Signed)
Progress Note  Patient Name: Veronica Romero Date of Encounter: 06/15/2021  Primary Cardiologist: Thomasene Ripple, DO   Subjective   Patient seen and examined at her bedside. She was sitting in bed when I arrived, family at bedside.  She tells me that she feels weak but no chest pain ,shortness of breath is improving.  Inpatient Medications    Scheduled Meds:  aspirin  81 mg Oral Daily   chlorhexidine gluconate (MEDLINE KIT)  15 mL Mouth Rinse BID   Chlorhexidine Gluconate Cloth  6 each Topical Daily   enoxaparin (LOVENOX) injection  1 mg/kg Subcutaneous Q12H   famotidine  20 mg Oral BID   folic acid  1 mg Oral Daily   hydrocortisone sod succinate (SOLU-CORTEF) inj  50 mg Intravenous Q12H   insulin aspart  0-15 Units Subcutaneous TID WC   insulin aspart  2 Units Subcutaneous TID WC   multivitamin with minerals  1 tablet Oral Daily   potassium & sodium phosphates  1 packet Oral TID WC & HS   potassium chloride  40 mEq Oral Q4H   [START ON 06/17/2021] predniSONE  5 mg Oral Q breakfast   spironolactone  25 mg Oral Daily   thiamine  100 mg Oral Daily   Continuous Infusions:  sodium chloride     azithromycin Stopped (06/14/21 1837)   piperacillin-tazobactam (ZOSYN)  IV 12.5 mL/hr at 06/15/21 0600   PRN Meds: acetaminophen **OR** acetaminophen, benzonatate, docusate, levalbuterol, ondansetron (ZOFRAN) IV, polyethylene glycol   Vital Signs    Vitals:   06/15/21 0600 06/15/21 0700 06/15/21 0800 06/15/21 0900  BP: (!) 121/50 (!) 119/47 (!) 87/69 (!) 116/44  Pulse: 81 74 83 75  Resp: (!) 21 (!) 25 (!) 27 18  Temp:  (!) 97.2 F (36.2 C)    TempSrc:  Axillary    SpO2: 93% 97% 91% 95%  Weight:      Height:        Intake/Output Summary (Last 24 hours) at 06/15/2021 1043 Last data filed at 06/15/2021 0600 Gross per 24 hour  Intake 1096.8 ml  Output 4150 ml  Net -3053.2 ml   Filed Weights   06/12/21 0926 06/14/21 0333  Weight: 65 kg 75.4 kg    Telemetry    Sinus rhythm-  Personally Reviewed  ECG    None today - Personally Reviewed  Physical Exam    General: Comfortable, sitting up in a chair Head: Atraumatic, normal size  Eyes: PEERLA, EOMI  Neck: Supple, normal JVD Cardiac: Normal S1, S2; RRR; no murmurs, rubs, or gallops Lungs: Clear to auscultation bilaterally Abd: Soft, nontender, no hepatomegaly  Ext: warm, no edema Musculoskeletal: No deformities, BUE and BLE strength normal and equal Skin: Warm and dry, no rashes   Neuro: Alert and oriented to person, place, time, and situation, CNII-XII grossly intact, no focal deficits  Psych: Normal mood and affect   Labs    Chemistry Recent Labs  Lab 06/12/21 1115 06/12/21 1410 06/13/21 0044 06/13/21 0422 06/13/21 0841 06/14/21 0703 06/15/21 0309  NA 139   < > 138   < > 140 141 143  K 3.0*   < > 3.8   < > 3.9 3.3* 3.0*  CL 104   < > 108  --   --  106 102  CO2 24   < > 21*  --   --  25 31  GLUCOSE 172*   < > 192*  --   --  164* 132*  BUN 8   < > 13  --   --  18 14  CREATININE 0.76   < > 0.70  --   --  0.69 0.66  CALCIUM 7.9*   < > 8.2*  --   --  7.6* 7.8*  PROT 6.7  --   --   --   --   --   --   ALBUMIN 2.8*  --   --   --   --   --   --   AST 116*  --   --   --   --   --   --   ALT 35  --   --   --   --   --   --   ALKPHOS 117  --   --   --   --   --   --   BILITOT 1.3*  --   --   --   --   --   --   GFRNONAA >60   < > >60  --   --  >60 >60  ANIONGAP 11   < > 9  --   --  10 10   < > = values in this interval not displayed.     Hematology Recent Labs  Lab 06/13/21 0044 06/13/21 0422 06/13/21 0841 06/14/21 0703 06/15/21 0309  WBC 10.7*  --   --  13.9* 11.8*  RBC 3.15*  --   --  3.08* 2.96*  HGB 9.8*   < > 11.6* 9.7* 9.3*  HCT 29.2*   < > 34.0* 28.6* 27.2*  MCV 92.7  --   --  92.9 91.9  MCH 31.1  --   --  31.5 31.4  MCHC 33.6  --   --  33.9 34.2  RDW 13.6  --   --  13.8 13.7  PLT 296  --   --  352 357   < > = values in this interval not displayed.    Cardiac EnzymesNo  results for input(s): TROPONINI in the last 168 hours. No results for input(s): TROPIPOC in the last 168 hours.   BNP Recent Labs  Lab 06/12/21 1115 06/14/21 1719  BNP 271.8* 541.6*     DDimer No results for input(s): DDIMER in the last 168 hours.   Radiology    No results found.  Cardiac Studies   TTE 06/12/2021 IMPRESSIONS   1. Left ventricular ejection fraction, by estimation, is 25 to 30%. The  left ventricle has severely decreased function. The left ventricle  demonstrates regional wall motion abnormalities (see scoring  diagram/findings for description). Left ventricular  diastolic parameters are consistent with Grade I diastolic dysfunction  (impaired relaxation).   2. Right ventricular systolic function is mildly reduced. The right  ventricular size is normal. There is mildly elevated pulmonary artery  systolic pressure. The estimated right ventricular systolic pressure is  68.1 mmHg.   3. The mitral valve is normal in structure. Trivial mitral valve  regurgitation. No evidence of mitral stenosis.   4. The aortic valve is tricuspid. Aortic valve regurgitation is mild.  Aortic valve sclerosis/calcification is present, without any evidence of  aortic stenosis.   5. The inferior vena cava is dilated in size with <50% respiratory  variability, suggesting right atrial pressure of 15 mmHg.   FINDINGS   Left Ventricle: Left ventricular ejection fraction, by estimation, is 25  to 30%. The left ventricle has severely decreased function. The left  ventricle  demonstrates regional wall motion abnormalities. The left  ventricular internal cavity size was normal   in size. There is no left ventricular hypertrophy. Left ventricular  diastolic parameters are consistent with Grade I diastolic dysfunction  (impaired relaxation).      LV Wall Scoring:  The mid and distal anterior wall, entire anterior septum, entire apex, mid  and distal inferior wall, and mid inferoseptal  segment are akinetic. The  antero-lateral wall, posterior wall, basal anterior segment, basal  inferior  segment, and basal inferoseptal segment are normal.   Right Ventricle: The right ventricular size is normal. No increase in  right ventricular wall thickness. Right ventricular systolic function is  mildly reduced. There is mildly elevated pulmonary artery systolic  pressure. The tricuspid regurgitant velocity   is 2.41 m/s, and with an assumed right atrial pressure of 15 mmHg, the  estimated right ventricular systolic pressure is 00.3 mmHg.   Left Atrium: Left atrial size was normal in size.   Right Atrium: Right atrial size was normal in size.   Pericardium: There is no evidence of pericardial effusion.   Mitral Valve: The mitral valve is normal in structure. Trivial mitral  valve regurgitation. No evidence of mitral valve stenosis.   Tricuspid Valve: The tricuspid valve is normal in structure. Tricuspid  valve regurgitation is mild.   Aortic Valve: The aortic valve is tricuspid. Aortic valve regurgitation is  mild. Aortic regurgitation PHT measures 448 msec. Aortic valve  sclerosis/calcification is present, without any evidence of aortic  stenosis. Aortic valve peak gradient measures 6.2   mmHg.   Pulmonic Valve: The pulmonic valve was not well visualized. Pulmonic valve  regurgitation is trivial.   Aorta: The aortic root and ascending aorta are structurally normal, with  no evidence of dilitation.   Venous: The inferior vena cava is dilated in size with less than 50%  respiratory variability, suggesting right atrial pressure of 15 mmHg.   IAS/Shunts: The interatrial septum was not well visualized.   Patient Profile     71 y.o. female rheumatoid arthritis who presented for acute respiratory failure was intubated shortly now extubated with improvement of her respiratory failure.  Noted with elevated troponin, recent echocardiogram showed depressed ejection fraction  with wall motion abnormalities.  Assessment & Plan    NSTEMI Acute systolic heart failure Dilated cardiomyopathy EF 25-30% with wall motion abnormalities Acute hypoxic respiratory failure which is improving still on nasal cannula Community-acquired pneumonia Rheumatoid arthritis  She appears to be responded well to her IV diuretics.  Overnight 3053 milliliters output.  Her breathing is improving she tells me.  I will recommend another Lasix 40 mg daily IV.  She is also on spironolactone 25 mg daily.  Her blood pressure is on the lower side.  She will need to be on Entresto as well as SGLT 2 inhibitors.  Cut back on spironolactone to 12.5 to allow for some blood pressure around with plans to add Entresto.  She is scheduled for a left heart catheterization tomorrow given her depressed ejection fraction wall motion abnormality suspected for ischemic disease.  She has been on repeat echo Lovenox I would recommend stopping that tonight.  Electrolyte abnormalities potassium 3.0 today.  Please continue to replete aggressively to keep K above 4 Antibiotics being managed by the primary team.  CRITICAL CARE Performed by: Berniece Salines  Total critical care time: 40 minutes. Critical care time was exclusive of separately billable procedures and treating other patients. Critical care was necessary to treat or  prevent imminent or life-threatening deterioration. Critical care was time spent personally by me on the following activities: development of treatment plan with patient and/or surrogate as well as nursing, discussions with consultants, evaluation of patient's response to treatment, examination of patient, obtaining history from patient or surrogate, ordering and performing treatments and interventions, ordering and review of laboratory studies, ordering and review of radiographic studies, pulse oximetry and re-evaluation of patient's condition.    Signed, Berniece Salines, DO [place]  06/15/2021 10:57 AM     For questions or updates, please contact [place] Please consult [email or web address] for contact info under Cardiology/STEMI.      Signed, Berniece Salines, DO  06/15/2021, 10:43 AM

## 2021-06-15 NOTE — Progress Notes (Signed)
Progress Note  Patient Name: Veronica Romero Date of Encounter: 06/15/2021  Primary Cardiologist: Thomasene Ripple, DO   Subjective   Patient seen and examined at her bedside. She was sitting in bed when I arrived, family at bedside.  She tells me that she feels weak but no chest pain ,shortness of breath is improving.  Inpatient Medications    Scheduled Meds:  aspirin  81 mg Oral Daily   chlorhexidine gluconate (MEDLINE KIT)  15 mL Mouth Rinse BID   Chlorhexidine Gluconate Cloth  6 each Topical Daily   enoxaparin (LOVENOX) injection  1 mg/kg Subcutaneous Q12H   famotidine  20 mg Oral BID   folic acid  1 mg Oral Daily   hydrocortisone sod succinate (SOLU-CORTEF) inj  50 mg Intravenous Q12H   insulin aspart  0-15 Units Subcutaneous TID WC   insulin aspart  2 Units Subcutaneous TID WC   multivitamin with minerals  1 tablet Oral Daily   potassium & sodium phosphates  1 packet Oral TID WC & HS   potassium chloride  40 mEq Oral Q4H   [START ON 06/17/2021] predniSONE  5 mg Oral Q breakfast   spironolactone  25 mg Oral Daily   thiamine  100 mg Oral Daily   Continuous Infusions:  sodium chloride     azithromycin Stopped (06/14/21 1837)   piperacillin-tazobactam (ZOSYN)  IV 12.5 mL/hr at 06/15/21 0600   PRN Meds: acetaminophen **OR** acetaminophen, benzonatate, docusate, levalbuterol, ondansetron (ZOFRAN) IV, polyethylene glycol   Vital Signs    Vitals:   06/15/21 0600 06/15/21 0700 06/15/21 0800 06/15/21 0900  BP: (!) 121/50 (!) 119/47 (!) 87/69 (!) 116/44  Pulse: 81 74 83 75  Resp: (!) 21 (!) 25 (!) 27 18  Temp:  (!) 97.2 F (36.2 C)    TempSrc:  Axillary    SpO2: 93% 97% 91% 95%  Weight:      Height:        Intake/Output Summary (Last 24 hours) at 06/15/2021 1043 Last data filed at 06/15/2021 0600 Gross per 24 hour  Intake 1096.8 ml  Output 4150 ml  Net -3053.2 ml   Filed Weights   06/12/21 0926 06/14/21 0333  Weight: 65 kg 75.4 kg    Telemetry    Sinus rhythm-  Personally Reviewed  ECG    None today - Personally Reviewed  Physical Exam    General: Comfortable, sitting up in a chair Head: Atraumatic, normal size  Eyes: PEERLA, EOMI  Neck: Supple, normal JVD Cardiac: Normal S1, S2; RRR; no murmurs, rubs, or gallops Lungs: Clear to auscultation bilaterally Abd: Soft, nontender, no hepatomegaly  Ext: warm, no edema Musculoskeletal: No deformities, BUE and BLE strength normal and equal Skin: Warm and dry, no rashes   Neuro: Alert and oriented to person, place, time, and situation, CNII-XII grossly intact, no focal deficits  Psych: Normal mood and affect   Labs    Chemistry Recent Labs  Lab 06/12/21 1115 06/12/21 1410 06/13/21 0044 06/13/21 0422 06/13/21 0841 06/14/21 0703 06/15/21 0309  NA 139   < > 138   < > 140 141 143  K 3.0*   < > 3.8   < > 3.9 3.3* 3.0*  CL 104   < > 108  --   --  106 102  CO2 24   < > 21*  --   --  25 31  GLUCOSE 172*   < > 192*  --   --  164* 132*  BUN 8   < > 13  --   --  18 14  CREATININE 0.76   < > 0.70  --   --  0.69 0.66  CALCIUM 7.9*   < > 8.2*  --   --  7.6* 7.8*  PROT 6.7  --   --   --   --   --   --   ALBUMIN 2.8*  --   --   --   --   --   --   AST 116*  --   --   --   --   --   --   ALT 35  --   --   --   --   --   --   ALKPHOS 117  --   --   --   --   --   --   BILITOT 1.3*  --   --   --   --   --   --   GFRNONAA >60   < > >60  --   --  >60 >60  ANIONGAP 11   < > 9  --   --  10 10   < > = values in this interval not displayed.     Hematology Recent Labs  Lab 06/13/21 0044 06/13/21 0422 06/13/21 0841 06/14/21 0703 06/15/21 0309  WBC 10.7*  --   --  13.9* 11.8*  RBC 3.15*  --   --  3.08* 2.96*  HGB 9.8*   < > 11.6* 9.7* 9.3*  HCT 29.2*   < > 34.0* 28.6* 27.2*  MCV 92.7  --   --  92.9 91.9  MCH 31.1  --   --  31.5 31.4  MCHC 33.6  --   --  33.9 34.2  RDW 13.6  --   --  13.8 13.7  PLT 296  --   --  352 357   < > = values in this interval not displayed.    Cardiac EnzymesNo  results for input(s): TROPONINI in the last 168 hours. No results for input(s): TROPIPOC in the last 168 hours.   BNP Recent Labs  Lab 06/12/21 1115 06/14/21 1719  BNP 271.8* 541.6*     DDimer No results for input(s): DDIMER in the last 168 hours.   Radiology    No results found.  Cardiac Studies   TTE 06/12/2021 IMPRESSIONS   1. Left ventricular ejection fraction, by estimation, is 25 to 30%. The  left ventricle has severely decreased function. The left ventricle  demonstrates regional wall motion abnormalities (see scoring  diagram/findings for description). Left ventricular  diastolic parameters are consistent with Grade I diastolic dysfunction  (impaired relaxation).   2. Right ventricular systolic function is mildly reduced. The right  ventricular size is normal. There is mildly elevated pulmonary artery  systolic pressure. The estimated right ventricular systolic pressure is  27.5 mmHg.   3. The mitral valve is normal in structure. Trivial mitral valve  regurgitation. No evidence of mitral stenosis.   4. The aortic valve is tricuspid. Aortic valve regurgitation is mild.  Aortic valve sclerosis/calcification is present, without any evidence of  aortic stenosis.   5. The inferior vena cava is dilated in size with <50% respiratory  variability, suggesting right atrial pressure of 15 mmHg.   FINDINGS   Left Ventricle: Left ventricular ejection fraction, by estimation, is 25  to 30%. The left ventricle has severely decreased function. The left  ventricle  demonstrates regional wall motion abnormalities. The left  ventricular internal cavity size was normal   in size. There is no left ventricular hypertrophy. Left ventricular  diastolic parameters are consistent with Grade I diastolic dysfunction  (impaired relaxation).      LV Wall Scoring:  The mid and distal anterior wall, entire anterior septum, entire apex, mid  and distal inferior wall, and mid inferoseptal  segment are akinetic. The  antero-lateral wall, posterior wall, basal anterior segment, basal  inferior  segment, and basal inferoseptal segment are normal.   Right Ventricle: The right ventricular size is normal. No increase in  right ventricular wall thickness. Right ventricular systolic function is  mildly reduced. There is mildly elevated pulmonary artery systolic  pressure. The tricuspid regurgitant velocity   is 2.41 m/s, and with an assumed right atrial pressure of 15 mmHg, the  estimated right ventricular systolic pressure is 27.5 mmHg.   Left Atrium: Left atrial size was normal in size.   Right Atrium: Right atrial size was normal in size.   Pericardium: There is no evidence of pericardial effusion.   Mitral Valve: The mitral valve is normal in structure. Trivial mitral  valve regurgitation. No evidence of mitral valve stenosis.   Tricuspid Valve: The tricuspid valve is normal in structure. Tricuspid  valve regurgitation is mild.   Aortic Valve: The aortic valve is tricuspid. Aortic valve regurgitation is  mild. Aortic regurgitation PHT measures 448 msec. Aortic valve  sclerosis/calcification is present, without any evidence of aortic  stenosis. Aortic valve peak gradient measures 6.2   mmHg.   Pulmonic Valve: The pulmonic valve was not well visualized. Pulmonic valve  regurgitation is trivial.   Aorta: The aortic root and ascending aorta are structurally normal, with  no evidence of dilitation.   Venous: The inferior vena cava is dilated in size with less than 50%  respiratory variability, suggesting right atrial pressure of 15 mmHg.   IAS/Shunts: The interatrial septum was not well visualized.   Patient Profile     71 y.o. female rheumatoid arthritis who presented for acute respiratory failure was intubated shortly now extubated with improvement of her respiratory failure.  Noted with elevated troponin, recent echocardiogram showed depressed ejection fraction  with wall motion abnormalities.  Assessment & Plan    NSTEMI Acute systolic heart failure Dilated cardiomyopathy EF 25-30% with wall motion abnormalities Acute hypoxic respiratory failure which is improving still on nasal cannula Community-acquired pneumonia Rheumatoid arthritis  She appears to be responded well to her IV diuretics.  Overnight 3053 milliliters output.  Her breathing is improving she tells me.  I will recommend another Lasix 40 mg daily IV.  She is also on spironolactone 25 mg daily.  Her blood pressure is on the lower side.  She will need to be on Entresto as well as SGLT 2 inhibitors.  Cut back on spironolactone to 12.5 to allow for some blood pressure around with plans to add Entresto.  She is scheduled for a left heart catheterization tomorrow given her depressed ejection fraction wall motion abnormality suspected for ischemic disease.  She has been on repeat echo Lovenox I would recommend stopping that tonight.  Electrolyte abnormalities potassium 3.0 today.  Please continue to replete aggressively to keep K above 4 Antibiotics being managed by the primary team.  CRITICAL CARE Performed by: Berniece Salines  Total critical care time: 40 minutes. Critical care time was exclusive of separately billable procedures and treating other patients. Critical care was necessary to treat or  prevent imminent or life-threatening deterioration. Critical care was time spent personally by me on the following activities: development of treatment plan with patient and/or surrogate as well as nursing, discussions with consultants, evaluation of patient's response to treatment, examination of patient, obtaining history from patient or surrogate, ordering and performing treatments and interventions, ordering and review of laboratory studies, ordering and review of radiographic studies, pulse oximetry and re-evaluation of patient's condition.    Signed, Berniece Salines, DO [place]  06/15/2021 10:57 AM     For questions or updates, please contact [place] Please consult [email or web address] for contact info under Cardiology/STEMI.      Signed, Berniece Salines, DO  06/15/2021, 10:43 AM

## 2021-06-15 NOTE — Progress Notes (Signed)
[facility] Progress Note Patient Name: Veronica Romero DOB: 07/20/1950 MRN: [id number]   Date of Service  06/15/2021  HPI/Events of Note  Hypokalemia - K+ = 3.0. Already on Phos NAK TID.   eICU Interventions  Plan: Klor-con 30 meq PO now (extra dose).     Intervention Category Major Interventions: Electrolyte abnormality - evaluation and management  Terence Googe [place] 06/15/2021, 4:19 AM

## 2021-06-15 NOTE — Progress Notes (Addendum)
NAME:  Veronica Romero, MRN:  [id number], DOB:  [birth year], LOS:  ADMISSION DATE:  06/11/2021, CONSULTATION DATE:  06/12/2021 REFERRING MD: Benny Lennert  CHIEF COMPLAINT:  Acute Respiratory Failure   History of Present Illness:  71 yo F PMH RA on methotrexate, prednisone, humira, recent COVID-19 infection with minimal sx presented to [facility] 2/12 with SOB and cough. She had increasing O2 requirements and PCCM consulted.  Pertinent  Medical History  RA on chronic immunosuppression  Significant Hospital Events: Including procedures, antibiotic start and stop dates in addition to other pertinent events   2/12 ED presentation for SOB, cough, hypoxia. Admit to [facility]. Azithro, rocephin. CTA neg for PE. Incr WOB requiring BiPAP 2/13 Worsening respiratory status. PCCM consulted for resp distress. Intubated. Transfer to ICU, bronchoscopy completed with BAL sent. Abx switched to Azitho, zosyn 2/14 Remained intubated and sedated 2/15 extubated  Interim History / Subjective:  Patient assessed at bedside this AM. She states that she is tired, but that her breathing feels better.  Daughter at bedside.  Objective   Blood pressure (!) 121/50, pulse 81, temperature 98.1 F (36.7 C), temperature source Oral, resp. rate (!) 21, height _0  (1.676 m), weight 75.4 kg, SpO2 93 %.    Vent Mode: PSV;CPAP FiO2 (%):  [40 %] 40 % PEEP:  [5 cmH20] 5 cmH20 Pressure Support:  [5 cmH20] 5 cmH20   Intake/Output Summary (Last 24 hours) at 06/15/2021 0938 Last data filed at 06/15/2021 1829 Gross per 24 hour  Intake 1626.01 ml  Output 4250 ml  Net -2623.99 ml    Filed Weights   06/12/21 0926 06/14/21 0333  Weight: 65 kg 75.4 kg   Examination: Constitutional: elderly female, awake, alert& oriented x4 HENT: normocephalic atraumatic, mucous membranes moist Eyes: conjunctiva non-erythematous Neck: supple Cardiovascular: regular rate and rhythm, no m/r/g Pulmonary/Chest: lungs clear to auscultation bilaterally, no  wheezing or rhonchi appreciated, breathing comfortably on 4 L [place] Abdominal: soft, non-tender, non-distended MSK: normal bulk and tone, ulner deviation present in hands bilaterally, no lower extremity edema Neurological: Alert and oriented x4 Skin: warm and dry  WBC 13.9-11.8 Hgb 9.7-9.3 K 3.0 Mg 2.1 BNP 541 I 1.71L O 4.25L N -2.5L  Resolved Hospital Problem list   Shock Hypophosphatemia  Assessment & Plan:  Acute Hypoxic Respiratory Failure 2/2 Community Acquired Pneumonia Immunocompromised Patient >> humira, prednisone,  and methotrexate/leukovorin  Leukocytosis, on hydrocortisone WBC 13.9-11.8, taking hydrocortisone. Patient breathing comfortably on 4 L [place] while maintaining stable oxygen saturations. - Continue Zosyn q 8 hrs day 4, Azithromycin day 5 - blood cultures ngtd - Levalbuterol q 6 hrs PRN - hydrocortisone 50 mg q 12 hrs, tapering stress dosing. On prednisone 5 mg at home. - holding immunosuppression - PT/ OT eval  Acute HFrEF, NSTEMI vs stress-induced cardiomyopathy Mild RV dysfunction Elevated troponin Echo showed EF 25-30% with severely decreased function of left ventricle.   -Plan for right and left heart cath on Friday, appreciate cardiology recommendations - continue ASA - Continue spironolactone 25 mg, once BP more stable can add entresto -Continue lovenox BID - tele - keep Mg >2.0, K > 4.0 Potassium repleted.  Rheumatoid Arthritis treated with Methotrexate /Humira/prednisone - hold immunosuppressive therapy - Continue hydrocortisone 100 mg  Hyperglycemia BGs 100s-160s. Patient receiving steroids, but tapering. She has required 8 units novolog in past 24 hours. - CBGs q 4 hrs - 2 units novolog with meals - SSI  - NPO after midnight  Normocytic anemia Hgb 9.3, MCV 91.9. Hgb stable, will consider  further work-up once patient more stable.  Best Practice (right click and "Reselect all SmartList Selections" daily)   Diet/type: Heart healthy DVT  prophylaxis: Enoxaparin BID GI prophylaxis: N/A Lines: N/A Foley:  Yes, and it is still needed Code Status:  full code Last date of multidisciplinary goals of care discussion [daughter updated at bedside 2/16]  Mckynlee Luse M. Rayola Everhart, D.O.  Internal Medicine Resident, PGY-1 [facility]  Pager: [phone] 6:22 AM, 06/15/2021

## 2021-06-15 NOTE — Progress Notes (Signed)
Nutrition Follow-up  DOCUMENTATION CODES:   Non-severe (moderate) malnutrition in context of acute illness/injury  INTERVENTION:   Ensure Enlive po BID, each supplement provides 350 kcal and 20 grams of protein.  MVI with minerals daily.  NUTRITION DIAGNOSIS:   Moderate Malnutrition related to acute illness (PNA) as evidenced by moderate muscle depletion, mild fat depletion, moderate fat depletion.  Ongoing  GOAL:   Patient will meet greater than or equal to 90% of their needs  Progressing  MONITOR:   Vent status, TF tolerance, Labs  REASON FOR ASSESSMENT:   Ventilator, Consult Enteral/tube feeding initiation and management  ASSESSMENT:   71 yo female admitted with acute respiratory failure, severe sepsis, bilateral multifocal pneumonia. PMH includes Rheumatoid arthritis.  Discussed patient in ICU rounds and with RN today. Extubated 2/15. Cortrak was removed after extubation.  Currently on a heart healthy diet.  Plans for left and right cardiac cath Friday. She will be NPO after midnight tonight.  RD to add PO supplements to maximize oral intake, given moderate malnutrition.   Labs reviewed. K 3 CBG: 134-129  Medications reviewed and include folic acid, solucortef, Novolog, MVI with minerals, Phos-Nak, potassium chloride, prednisone, Aldactone, thiamine.  Admission weight 65 kg (? Estimate) Current weight 75.4 kg I/O net +40 ml  Diet Order:   Diet Order             Diet NPO time specified  Diet effective midnight           Diet Heart Room service appropriate? Yes; Fluid consistency: Thin  Diet effective now                   EDUCATION NEEDS:   No education needs have been identified at this time  Skin:  Skin Assessment: Reviewed RN Assessment  Last BM:  2/15 type 7  Height:   Ht Readings from Last 1 Encounters:  06/12/21 5\' 6"  (1.676 m)    Weight:   Wt Readings from Last 1 Encounters:  06/14/21 75.4 kg    BMI:  Body mass index is  26.83 kg/m.  Estimated Nutritional Needs:   Kcal:  1800-2000  Protein:  85-100 gm  Fluid:  1.8-2 L    Gabriel Rainwater RD, LDN, CNSC Please refer to Amion for contact information.

## 2021-06-15 NOTE — Progress Notes (Signed)
Patient will be transferred to med- tele. [facility] will take over care 2/17 at 7AM.

## 2021-06-15 NOTE — Progress Notes (Signed)
Occupational Therapy Evaluation Patient Details Name: Veronica Romero MRN: [id number] DOB: 01/16/1951 Today's Date: 06/15/2021   History of Present Illness Pt is a 71 y/o female admitted due to multifocal PNA. Due to worsening respiration status, pt intubated 2/13. Extubated 2/15. PMH: recent COVID-19 infection, RA   Clinical Impression   PTA, pt lives with spouse that she assists d/t dementia. Pt Independent in all ADLs, IADL and mobility without AD. Pt presents now with deficits in cardiopulmonary tolerance (requiring supplemental O2), strength, standing balance and endurance. Pt eager to progress OOB with Min A x 1-2 for BSC/recliner transfers using RW with assist to correct posterior bias. Pt requires Setup for UB ADLs and up to Mod A for LB ADLs due to deficits. Began energy conservation education with plans to further address in next sessions. Anticipate pt will progress well enough to return home with HHOT, intermittent family support, and additional caregiver support for spouse.   SpO2 85% on 4 L O2 with activity, 92% at rest     Recommendations for follow up therapy are one component of a multi-disciplinary discharge planning process, led by the attending physician.  Recommendations may be updated based on patient status, additional functional criteria and insurance authorization.   Follow Up Recommendations  Home health OT    Assistance Recommended at Discharge Intermittent Supervision/Assistance  Patient can return home with the following A little help with walking and/or transfers;A little help with bathing/dressing/bathroom;Assistance with cooking/housework;Assist for transportation;Help with stairs or ramp for entrance    Functional Status Assessment  Patient has had a recent decline in their functional status and demonstrates the ability to make significant improvements in function in a reasonable and predictable amount of time.  Equipment Recommendations  BSC/3in1;Other  (comment) (Rolling walker)    Recommendations for Other Services       Precautions / Restrictions Precautions Precautions: Fall;Other (comment) Precaution Comments: flexiseal, monitor O2 (does not wear at baseline) Restrictions Weight Bearing Restrictions: No      Mobility Bed Mobility Overal bed mobility: Needs Assistance Bed Mobility: Supine to Sit     Supine to sit: Supervision, HOB elevated     General bed mobility comments: increased time, assist for lines    Transfers Overall transfer level: Needs assistance Equipment used: Rolling walker (2 wheels) Transfers: Sit to/from Stand, Bed to chair/wheelchair/BSC Sit to Stand: Min assist     Step pivot transfers: Min assist, +2 safety/equipment     General transfer comment: MIn A for power up, posterior bias in stepping to [facility] and recliner afterwards. cues for hand placement with improved power up      Balance Overall balance assessment: Needs assistance Sitting-balance support: No upper extremity supported, Feet supported Sitting balance-Leahy Scale: Fair     Standing balance support: Bilateral upper extremity supported, During functional activity Standing balance-Leahy Scale: Poor Standing balance comment: reliant on UE support in standing                           ADL either performed or assessed with clinical judgement   ADL Overall ADL's : Needs assistance/impaired Eating/Feeding: Set up;Sitting   Grooming: Set up;Sitting   Upper Body Bathing: Set up;Sitting   Lower Body Bathing: Moderate assistance;Sit to/from stand   Upper Body Dressing : Set up;Sitting   Lower Body Dressing: Moderate assistance;Sit to/from stand   Toilet Transfer: Minimal assistance;+2 for safety/equipment;Stand-pivot;Rolling walker (2 wheels);BSC/3in1 Toilet Transfer Details (indicate cue type and reason): cueing for sequencing,  posterior bias Toileting- Clothing Manipulation and Hygiene: Moderate assistance;Sit  to/from stand Toileting - Clothing Manipulation Details (indicate cue type and reason): assist for posterior peri care with flexiseal leaking       General ADL Comments: Limited primarily by cardiopulmonary deficits requiring frequent rest breaks and supplemental O2     Vision Baseline Vision/History: 1 Wears glasses Ability to See in Adequate Light: 0 Adequate Patient Visual Report: No change from baseline Vision Assessment?: No apparent visual deficits     Perception     Praxis      Pertinent Vitals/Pain Pain Assessment Pain Assessment: No/denies pain     Hand Dominance Right   Extremity/Trunk Assessment Upper Extremity Assessment Upper Extremity Assessment: RUE deficits/detail;LUE deficits/detail RUE Deficits / Details: hx of RA, arthritic deformities of B hands. pt reports difficulty holding cups (improved with use of cup with handle) RUE Coordination: decreased fine motor LUE Deficits / Details: hx of RA, arthritic deformities. ulnar drift LUE Coordination: decreased fine motor   Lower Extremity Assessment Lower Extremity Assessment: Defer to PT evaluation   Cervical / Trunk Assessment Cervical / Trunk Assessment: Kyphotic   Communication Communication Communication: No difficulties   Cognition Arousal/Alertness: Awake/alert Behavior During Therapy: WFL for tasks assessed/performed Overall Cognitive Status: Within Functional Limits for tasks assessed                                 General Comments: very pleasant, participatory     General Comments       Exercises     Shoulder Instructions      Home Living Family/patient expects to be discharged to:: Private residence Living Arrangements: Spouse/significant other Available Help at Discharge: Family Type of Home: Apartment Home Access: Level entry     Home Layout: One level     Bathroom Shower/Tub: Tub/shower unit;Walk-in shower   Bathroom Toilet: Standard     Home Equipment:  Agricultural consultant (2 wheels);Cane - single point;Shower seat   Additional Comments: DME at home for husband      Prior Functioning/Environment Prior Level of Function : Independent/Modified Independent;Driving             Mobility Comments: no use of AD typically ADLs Comments: Independent in all ADLs, IADLs, grocery shopping. 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 though family reports hiring daily caregivers to assist d/t pt hospitalization        OT Problem List: Decreased strength;Decreased activity tolerance;Impaired balance (sitting and/or standing);Decreased knowledge of use of DME or AE;Cardiopulmonary status limiting activity      OT Treatment/Interventions: Self-care/ADL training;Therapeutic exercise;Energy conservation;DME and/or AE instruction;Therapeutic activities;Patient/family education;Balance training    OT Goals(Current goals can be found in the care plan section) Acute Rehab OT Goals Patient Stated Goal: get out of bed, get my "tail" out (flexiseal) OT Goal Formulation: With patient Time For Goal Achievement: 06/29/21 Potential to Achieve Goals: Good  OT Frequency: Min 2X/week    Co-evaluation PT/OT/SLP Co-Evaluation/Treatment: Yes Reason for Co-Treatment: For patient/therapist safety;To address functional/ADL transfers   OT goals addressed during session: ADL's and self-care      AM-PAC OT "6 Clicks" Daily Activity     Outcome Measure Help from another person eating meals?: A Little Help from another person taking care of personal grooming?: A Little Help from another person toileting, which includes using toliet, bedpan, or urinal?: A Lot Help from another person bathing (including washing, rinsing, drying)?: A Lot Help from  another person to put on and taking off regular upper body clothing?: A Little Help from another person to put on and taking off regular lower body clothing?: A Lot 6 Click Score: 15   End of Session  Equipment Utilized During Treatment: Gait belt;Rolling walker (2 wheels);Oxygen Nurse Communication: Mobility status  Activity Tolerance: Patient tolerated treatment well Patient left: in chair;with call bell/phone within reach;with chair alarm set  OT Visit Diagnosis: Muscle weakness (generalized) (M62.81);Other abnormalities of gait and mobility (R26.89);Unsteadiness on feet (R26.81)                Time: 9147-8295 OT Time Calculation (min): 33 min Charges:  OT General Charges $OT Visit: 1 Visit OT Evaluation $OT Eval Moderate Complexity: 1 Mod  Bradd Canary, OTR/L Acute Rehab Services Office: [phone]   Lorre Munroe 06/15/2021, 9:58 AM

## 2021-06-16 ENCOUNTER — Encounter (HOSPITAL_COMMUNITY)
Admission: EM | Disposition: A | Discharge status: Home Health | Payer: Self-pay | Source: Home / Self Care | Attending: Internal Medicine

## 2021-06-16 ENCOUNTER — Encounter (HOSPITAL_COMMUNITY): Payer: Self-pay | Admitting: Interventional Cardiology

## 2021-06-16 ENCOUNTER — Other Ambulatory Visit (HOSPITAL_COMMUNITY): Payer: Self-pay

## 2021-06-16 DIAGNOSIS — I5023 Acute on chronic systolic (congestive) heart failure: Secondary | ICD-10-CM

## 2021-06-16 DIAGNOSIS — I2510 Atherosclerotic heart disease of native coronary artery without angina pectoris: Secondary | ICD-10-CM | POA: Diagnosis not present

## 2021-06-16 HISTORY — PX: RIGHT/LEFT HEART CATH AND CORONARY ANGIOGRAPHY: CATH118266

## 2021-06-16 LAB — BASIC METABOLIC PANEL
Anion gap: 8 (ref 5–15)
BUN: 17 mg/dL (ref 8–23)
CO2: 30 mmol/L (ref 22–32)
Calcium: 8.3 mg/dL — ABNORMAL LOW (ref 8.9–10.3)
Chloride: 102 mmol/L (ref 98–111)
Creatinine, Ser: 0.63 mg/dL (ref 0.44–1.00)
GFR, Estimated: >60 mL/min (ref >60)
Glucose, Bld: 122 mg/dL — ABNORMAL HIGH (ref 70–99)
Potassium: 4.1 mmol/L (ref 3.5–5.1)
Sodium: 140 mmol/L (ref 135–145)

## 2021-06-16 LAB — POCT I-STAT 7, (LYTES, BLD GAS, ICA,H+H)
Acid-Base Excess: 7 mmol/L — ABNORMAL HIGH (ref 0.0–2.0)
Acid-Base Excess: 7 mmol/L — ABNORMAL HIGH (ref 0.0–2.0)
Acid-Base Excess: 8 mmol/L — ABNORMAL HIGH (ref 0.0–2.0)
Bicarbonate: 30.1 mmol/L — ABNORMAL HIGH (ref 20.0–28.0)
Bicarbonate: 30.1 mmol/L — ABNORMAL HIGH (ref 20.0–28.0)
Bicarbonate: 31.9 mmol/L — ABNORMAL HIGH (ref 20.0–28.0)
Calcium, Ion: 1.1 mmol/L — ABNORMAL LOW (ref 1.15–1.40)
Calcium, Ion: 1.15 mmol/L (ref 1.15–1.40)
Calcium, Ion: 1.15 mmol/L (ref 1.15–1.40)
HCT: 27 % — ABNORMAL LOW (ref 36.0–46.0)
HCT: 27 % — ABNORMAL LOW (ref 36.0–46.0)
HCT: 28 % — ABNORMAL LOW (ref 36.0–46.0)
Hemoglobin: 9.2 g/dL — ABNORMAL LOW (ref 12.0–15.0)
Hemoglobin: 9.2 g/dL — ABNORMAL LOW (ref 12.0–15.0)
Hemoglobin: 9.5 g/dL — ABNORMAL LOW (ref 12.0–15.0)
O2 Saturation: 68 %
O2 Saturation: 70 %
O2 Saturation: 94 %
Potassium: 3.2 mmol/L — ABNORMAL LOW (ref 3.5–5.1)
Potassium: 3.3 mmol/L — ABNORMAL LOW (ref 3.5–5.1)
Potassium: 3.4 mmol/L — ABNORMAL LOW (ref 3.5–5.1)
Sodium: 141 mmol/L (ref 135–145)
Sodium: 142 mmol/L (ref 135–145)
Sodium: 142 mmol/L (ref 135–145)
TCO2: 31 mmol/L (ref 22–32)
TCO2: 31 mmol/L (ref 22–32)
TCO2: 33 mmol/L — ABNORMAL HIGH (ref 22–32)
pCO2 arterial: 37 mmHg (ref 32–48)
pCO2 arterial: 38 mmHg (ref 32–48)
pCO2 arterial: 39.7 mmHg (ref 32–48)
pH, Arterial: 7.507 — ABNORMAL HIGH (ref 7.35–7.45)
pH, Arterial: 7.513 — ABNORMAL HIGH (ref 7.35–7.45)
pH, Arterial: 7.518 — ABNORMAL HIGH (ref 7.35–7.45)
pO2, Arterial: 32 mmHg — CL (ref 83–108)
pO2, Arterial: 33 mmHg — CL (ref 83–108)
pO2, Arterial: 64 mmHg — ABNORMAL LOW (ref 83–108)

## 2021-06-16 LAB — CBC
HCT: 29.3 % — ABNORMAL LOW (ref 36.0–46.0)
Hemoglobin: 9.5 g/dL — ABNORMAL LOW (ref 12.0–15.0)
MCH: 30.6 pg (ref 26.0–34.0)
MCHC: 32.4 g/dL (ref 30.0–36.0)
MCV: 94.5 fL (ref 80.0–100.0)
Platelets: 343 10*3/uL (ref 150–400)
RBC: 3.1 MIL/uL — ABNORMAL LOW (ref 3.87–5.11)
RDW: 13.7 % (ref 11.5–15.5)
WBC: 11.8 10*3/uL — ABNORMAL HIGH (ref 4.0–10.5)
nRBC: 0 % (ref 0.0–0.2)

## 2021-06-16 LAB — GLUCOSE, CAPILLARY
Glucose-Capillary: 121 mg/dL — ABNORMAL HIGH (ref 70–99)
Glucose-Capillary: 97 mg/dL (ref 70–99)
Glucose-Capillary: 128 mg/dL — ABNORMAL HIGH (ref 70–99)
Glucose-Capillary: 127 mg/dL — ABNORMAL HIGH (ref 70–99)

## 2021-06-16 LAB — CULTURE, BLOOD (ROUTINE X 2)
Culture: NO GROWTH
Culture: NO GROWTH
Special Requests: ADEQUATE
Special Requests: ADEQUATE

## 2021-06-16 LAB — MAGNESIUM: Magnesium: 2.3 mg/dL (ref 1.7–2.4)

## 2021-06-16 LAB — PHOSPHORUS: Phosphorus: 3.1 mg/dL (ref 2.5–4.6)

## 2021-06-16 LAB — LIPID PANEL
Cholesterol: 177 mg/dL (ref 0–200)
HDL: 38 mg/dL — ABNORMAL LOW (ref >40)
LDL Cholesterol: 116 mg/dL — ABNORMAL HIGH (ref 0–99)
Total CHOL/HDL Ratio: 4.7 RATIO
Triglycerides: 115 mg/dL (ref <150)
VLDL: 23 mg/dL (ref 0–40)

## 2021-06-16 SURGERY — RIGHT/LEFT HEART CATH AND CORONARY ANGIOGRAPHY
Anesthesia: LOCAL

## 2021-06-16 MED ORDER — SODIUM CHLORIDE 0.9 % IV SOLN: INTRAVENOUS | Status: AC

## 2021-06-16 MED ORDER — ASPIRIN 81 MG PO CHEW
81.0000 mg | CHEWABLE_TABLET | ORAL | Status: AC
  Administered 2021-06-16: 81 mg via ORAL
  Filled 2021-06-16: qty 1

## 2021-06-16 MED ORDER — SODIUM CHLORIDE 0.9% FLUSH
3.0000 mL | Freq: Two times a day (BID) | INTRAVENOUS | Status: DC
  Administered 2021-06-16: 3 mL via INTRAVENOUS

## 2021-06-16 MED ORDER — SODIUM CHLORIDE 0.9 % IV SOLN: 250.0000 mL | INTRAVENOUS | Status: DC | PRN

## 2021-06-16 MED ORDER — FENTANYL CITRATE (PF) 100 MCG/2ML IJ SOLN
INTRAMUSCULAR | Status: DC | PRN
  Administered 2021-06-16: 25 ug via INTRAVENOUS

## 2021-06-16 MED ORDER — LIDOCAINE HCL (PF) 1 % IJ SOLN
INTRAMUSCULAR | Status: DC | PRN
  Administered 2021-06-16 (×2): 2 mL

## 2021-06-16 MED ORDER — ENOXAPARIN SODIUM 40 MG/0.4ML IJ SOSY
40.0000 mg | PREFILLED_SYRINGE | Freq: Every day | INTRAMUSCULAR | Status: DC
  Administered 2021-06-17 – 2021-06-21 (×5): 40 mg via SUBCUTANEOUS
  Filled 2021-06-16 (×7): qty 0.4

## 2021-06-16 MED ORDER — HYDRALAZINE HCL 20 MG/ML IJ SOLN: 10.0000 mg | INTRAMUSCULAR | Status: AC | PRN

## 2021-06-16 MED ORDER — HEPARIN (PORCINE) IN NACL 1000-0.9 UT/500ML-% IV SOLN
INTRAVENOUS | Status: DC | PRN
  Administered 2021-06-16 (×2): 500 mL

## 2021-06-16 MED ORDER — LABETALOL HCL 5 MG/ML IV SOLN: 10.0000 mg | INTRAVENOUS | Status: AC | PRN

## 2021-06-16 MED ORDER — VERAPAMIL HCL 2.5 MG/ML IV SOLN
INTRAVENOUS | Status: AC
  Filled 2021-06-16: qty 2

## 2021-06-16 MED ORDER — SODIUM CHLORIDE 0.9% FLUSH: 3.0000 mL | INTRAVENOUS | Status: DC | PRN

## 2021-06-16 MED ORDER — SODIUM CHLORIDE 0.9% FLUSH
3.0000 mL | Freq: Two times a day (BID) | INTRAVENOUS | Status: DC
  Administered 2021-06-16 – 2021-06-17 (×2): 3 mL via INTRAVENOUS

## 2021-06-16 MED ORDER — MIDAZOLAM HCL 2 MG/2ML IJ SOLN
INTRAMUSCULAR | Status: DC | PRN
  Administered 2021-06-16: .5 mg via INTRAVENOUS

## 2021-06-16 MED ORDER — SODIUM CHLORIDE 0.9 % IV SOLN: INTRAVENOUS | Status: DC

## 2021-06-16 MED ORDER — FENTANYL CITRATE (PF) 100 MCG/2ML IJ SOLN
INTRAMUSCULAR | Status: AC
Start: 2021-06-16 — End: ?
  Filled 2021-06-16: qty 2

## 2021-06-16 MED ORDER — MIDAZOLAM HCL 2 MG/2ML IJ SOLN
INTRAMUSCULAR | Status: AC
  Filled 2021-06-16: qty 2

## 2021-06-16 MED ORDER — ASPIRIN 81 MG PO CHEW: 81.0000 mg | CHEWABLE_TABLET | ORAL | Status: DC

## 2021-06-16 MED ORDER — HEPARIN (PORCINE) IN NACL 1000-0.9 UT/500ML-% IV SOLN
INTRAVENOUS | Status: AC
  Filled 2021-06-16: qty 1000

## 2021-06-16 MED ORDER — ROSUVASTATIN CALCIUM 5 MG PO TABS
10.0000 mg | ORAL_TABLET | Freq: Every day | ORAL | Status: DC
Start: 2021-06-16 — End: 2021-06-20
  Administered 2021-06-16 – 2021-06-20 (×5): 10 mg via ORAL
  Filled 2021-06-16 (×5): qty 2

## 2021-06-16 MED ORDER — IOHEXOL 350 MG/ML SOLN
INTRAVENOUS | Status: DC | PRN
  Administered 2021-06-16: 75 mL

## 2021-06-16 MED ORDER — MENTHOL 3 MG MT LOZG
1.0000 | LOZENGE | OROMUCOSAL | Status: AC | PRN
  Administered 2021-06-16: 3 mg via ORAL
  Filled 2021-06-16 (×2): qty 9

## 2021-06-16 MED ORDER — LIDOCAINE HCL (PF) 1 % IJ SOLN
INTRAMUSCULAR | Status: AC
  Filled 2021-06-16: qty 30

## 2021-06-16 MED ORDER — ONDANSETRON HCL 4 MG/2ML IJ SOLN: 4.0000 mg | Freq: Four times a day (QID) | INTRAMUSCULAR | Status: DC | PRN

## 2021-06-16 MED ORDER — VERAPAMIL HCL 2.5 MG/ML IV SOLN
INTRAVENOUS | Status: DC | PRN
  Administered 2021-06-16: 10 mL via INTRA_ARTERIAL

## 2021-06-16 MED ORDER — HEPARIN SODIUM (PORCINE) 1000 UNIT/ML IJ SOLN
INTRAMUSCULAR | Status: DC | PRN
  Administered 2021-06-16: 4000 [IU] via INTRAVENOUS

## 2021-06-16 MED ORDER — HEPARIN SODIUM (PORCINE) 1000 UNIT/ML IJ SOLN
INTRAMUSCULAR | Status: AC
  Filled 2021-06-16: qty 10

## 2021-06-16 MED ORDER — ACETAMINOPHEN 325 MG PO TABS: 650.0000 mg | ORAL_TABLET | ORAL | Status: DC | PRN

## 2021-06-16 SURGICAL SUPPLY — 11 items
CATH 5FR JL3.5 JR4 ANG PIG MP (CATHETERS) ×1 IMPLANT
CATH BALLN WEDGE 5F 110CM (CATHETERS) ×1 IMPLANT
DEVICE RAD COMP TR BAND LRG (VASCULAR PRODUCTS) ×1 IMPLANT
GLIDESHEATH SLEND A-KIT 6F 22G (SHEATH) ×1 IMPLANT
GUIDEWIRE INQWIRE 1.5J.035X260 (WIRE) IMPLANT
INQWIRE 1.5J .035X260CM (WIRE) ×2
KIT HEART LEFT (KITS) ×2 IMPLANT
PACK CARDIAC CATHETERIZATION (CUSTOM PROCEDURE TRAY) ×2 IMPLANT
SHEATH GLIDE SLENDER 4/5FR (SHEATH) ×1 IMPLANT
TRANSDUCER W/STOPCOCK (MISCELLANEOUS) ×2 IMPLANT
TUBING CIL FLEX 10 FLL-RA (TUBING) ×2 IMPLANT

## 2021-06-16 NOTE — Interval H&P Note (Signed)
Cath Lab Visit (complete for each Cath Lab visit)  Clinical Evaluation Leading to the Procedure:   ACS: No.  Non-ACS:    Anginal Classification: CCS II  Anti-ischemic medical therapy: Minimal Therapy (1 class of medications)  Non-Invasive Test Results: No non-invasive testing performed  Prior CABG: No previous CABG      History and Physical Interval Note:  06/16/2021 9:10 AM  Veronica Romero  has presented today for surgery, with the diagnosis of new heart failure.  The various methods of treatment have been discussed with the patient and family. After consideration of risks, benefits and other options for treatment, the patient has consented to  Procedure(s): RIGHT/LEFT HEART CATH AND CORONARY ANGIOGRAPHY (N/A) as a surgical intervention.  The patient's history has been reviewed, patient examined, no change in status, stable for surgery.  I have reviewed the patient's chart and labs.  Questions were answered to the patient's satisfaction.     Lyn Records III

## 2021-06-16 NOTE — CV Procedure (Signed)
Severe circumflex and LAD coronary calcification Segmental 85% proximal stenosis in LAD.  Vessel is large and wraps around the apex. Circumflex is dominant and widely patent. RCA is nondominant with mid 50% narrowing before a large acute marginal branch. LVEF seems to be improving compared to echo reports.  EF appears to be in the 40% range at this time.  LVEDP 14 mmHg.  Post hand-injection LVEDP rose to 23 mmHg Right heart: Mean wedge 14 mmHg.;  Mean PA pressure 22 mmHg; mean RA pressure 6 mmHg   Impression: Significant calcified segmental proximal 85% LAD stenosis.  Coronaries otherwise clean.  Requiring oxygen to maintain O2 saturation greater than 93% in the Cath Lab.  Recommendation: Further management of respiratory failure, optimization of heart failure therapy, monitoring of hemoglobin, and when clinically stable atherectomy of ostial to proximal LAD with stenting.  I believe the PCI could be done more or less electively over the next 1 to 6 weeks allowing time to recover unless she has recurrent angina or flash heart failure.

## 2021-06-16 NOTE — Progress Notes (Signed)
PROGRESS NOTE                                                                                                                                                                                                             Patient Demographics:    Veronica Romero, is a 71 y.o. female, DOB - 1950/12/14, [id number]  Outpatient Primary MD for the patient is Pcp, No    LOS - 5  Admit date - 06/11/2021    Chief Complaint  Patient presents with   Cough   Chest Pain   Shortness of Breath       Brief Narrative (HPI from H&P)   71 yo F PMH RA on methotrexate, prednisone, humira, recent COVID-19 infection with minimal sx presented to [facility] 06/11/21 with SOB and cough.  She rapidly developed worsening of her respiratory status and she was intubated and placed in ICU, she underwent diagnostic bronchoscopy, was given IV antibiotics was also seen by cardiology team for elevated troponin and possible heart failure.  She was extubated on 06/14/2021 and transferred to hospitalist service on 06/16/2021.   Subjective:    Veronica Romero today has, No headache, No chest pain, No abdominal pain - No Nausea, No new weakness tingling or numbness, improved cough and shortness of breath.  Still has problems laying flat.   Assessment  & Plan :    Acute Hypoxic Resp. Failure due to CAP in a patient who is immunocompromised due to combination of Humira, prednisone, methotrexate for her underlying RA - she was initially intubated for 3 days and finally extubated on 06/14/2021, underwent diagnostic bronchoscopy, she is on combination of azithromycin and Zosyn and stop dates placed by ICU team.  Currently on nasal cannula oxygen 3 to 4 L along with supportive care with nebulizer treatments.  Steroids being tapered off note she takes 5 mg of prednisone at baseline for her RA.  Encouraged the patient to sit up in chair in the daytime use I-S and flutter valve for  pulmonary toiletry.  Will advance activity and titrate down oxygen as possible.  2.  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  With NSTEMI.  Currently on combination of aspirin, Aldactone along with Lasix as tolerated by blood pressure.  Cardiology on board and managing cardiology issues patient to undergo left heart  cath on 06/16/2021.  Add high intensity statin on 06/16/2021 for secondary prevention.  3.  RA.  Currently on IV steroids, takes 5 mg of prednisone at baseline.  Methotrexate and Humira on hold.  4.  Alcohol use.  Counseled to cut down, on vitamins, no DTs will monitor.  5.  Normocytic anemia.  Monitor.      Condition - Extremely Guarded  Family Communication  : Daughter bedside on 06/16/2021  Code Status : Full  Consults  : PCCM, cardiology  PUD Prophylaxis : Pepcid   Procedures  :     Intubated on 06/11/2021 extubated 06/14/2021.  Echocardiogram.  1. Left ventricular ejection fraction, by estimation, is 25 to 30%. The left ventricle has severely decreased function. The left ventricle demonstrates regional wall motion abnormalities (see scoring diagram/findings for description). Left ventricular diastolic parameters are consistent with Grade I diastolic dysfunction (impaired relaxation).  2. Right ventricular systolic function is mildly reduced. The right ventricular size is normal. There is mildly elevated pulmonary artery systolic pressure. The estimated right ventricular systolic pressure is 0000000 mmHg.  3. The mitral valve is normal in structure. Trivial mitral valve regurgitation. No evidence of mitral stenosis.  4. The aortic valve is tricuspid. Aortic valve regurgitation is mild. Aortic valve sclerosis/calcification is present, without any evidence of aortic stenosis.  5. The inferior vena cava is dilated in size with <50% respiratory variability, suggesting right atrial pressure of 15 mmHg.  Left heart cath.      Disposition Plan  :     Status is: Inpatient  DVT Prophylaxis  :    SCDs Start: 06/12/21 [id number]    Lab Results  Component Value Date   PLT 343 06/16/2021    Diet :  Diet Order             Diet NPO time specified Except for: Sips with Meds  Diet effective midnight                    Inpatient Medications  Scheduled Meds:  aspirin EC  81 mg Oral Daily   Chlorhexidine Gluconate Cloth  6 each Topical Daily   enoxaparin (LOVENOX) injection  1 mg/kg Subcutaneous Q12H   famotidine  20 mg Oral BID   feeding supplement  237 mL Oral BID BM   folic acid  1 mg Oral Daily   hydrocortisone sod succinate (SOLU-CORTEF) inj  50 mg Intravenous Q12H   insulin aspart  0-9 Units Subcutaneous TID WC   mouth rinse  15 mL Mouth Rinse BID   multivitamin with minerals  1 tablet Oral Daily   potassium & sodium phosphates  1 packet Oral TID WC & HS   [START ON 06/17/2021] predniSONE  5 mg Oral Q breakfast   sodium chloride flush  3 mL Intravenous Q12H   spironolactone  12.5 mg Oral Daily   thiamine  100 mg Oral Daily   Continuous Infusions:  sodium chloride     sodium chloride     sodium chloride 10 mL/hr at 06/16/21 0602   piperacillin-tazobactam (ZOSYN)  IV 3.375 g (06/16/21 0603)   PRN Meds:.sodium chloride, acetaminophen **OR** acetaminophen, benzonatate, docusate, levalbuterol, ondansetron (ZOFRAN) IV, polyethylene glycol, sodium chloride flush  Antibiotics  :    Anti-infectives (From admission, onward)    Start     Dose/Rate Route Frequency Ordered Stop   06/12/21 1000  piperacillin-tazobactam (ZOSYN) IVPB 3.375 g        3.375 g 12.5 mL/hr over 240 Minutes  Intravenous Every 8 hours 06/12/21 0947 06/18/21 2359   06/11/21 1800  cefTRIAXone (ROCEPHIN) 2 g in sodium chloride 0.9 % 100 mL IVPB  Status:  Discontinued        2 g 200 mL/hr over 30 Minutes Intravenous Every 24 hours 06/11/21 1749 06/12/21 0911   06/11/21 1800  azithromycin (ZITHROMAX) 500 mg in sodium chloride 0.9 % 250 mL IVPB        500  mg 250 mL/hr over 60 Minutes Intravenous Every 24 hours 06/11/21 1749 06/15/21 1951        Time Spent in minutes  30   Susa Raring M.D on 06/16/2021 at 8:58 AM  To page go to [email or web address]   [facility] -  Office  [phone]  See all Orders from today for further details    Objective:   Vitals:   06/16/21 0541 06/16/21 0600 06/16/21 0700 06/16/21 0800  BP:  (!) 110/55 (!) 115/50 (!) 105/53  Pulse: 70 74 67 71  Resp: (!) 23 (!) 23 18 (!) 26  Temp:    98.5 F (36.9 C)  TempSrc:    Oral  SpO2: 95% 96% 95% 92%  Weight: 73.5 kg     Height:        Wt Readings from Last 3 Encounters:  06/16/21 73.5 kg     Intake/Output Summary (Last 24 hours) at 06/16/2021 0858 Last data filed at 06/16/2021 0300 Gross per 24 hour  Intake 341.88 ml  Output 2150 ml  Net -1808.12 ml     Physical Exam  Awake Alert, No new F.N deficits, Normal affect [place].AT,PERRAL Supple Neck, No JVD,   Symmetrical Chest wall movement, Good air movement bilaterally, ++ rales RRR,No Gallops,Rubs or new Murmurs,  +ve B.Sounds, Abd Soft, No tenderness,   No Cyanosis, trace edema      Data Review:    CBC Recent Labs  Lab 06/12/21 1115 06/12/21 1410 06/13/21 0044 06/13/21 0422 06/13/21 0841 06/14/21 0703 06/15/21 0309 06/16/21 0208  WBC 23.1*  --  10.7*  --   --  13.9* 11.8* 11.8*  HGB 10.4*   < > 9.8* 8.8* 11.6* 9.7* 9.3* 9.5*  HCT 30.8*   < > 29.2* 26.0* 34.0* 28.6* 27.2* 29.3*  PLT 392  --  296  --   --  352 357 343  MCV 94.2  --  92.7  --   --  92.9 91.9 94.5  MCH 31.8  --  31.1  --   --  31.5 31.4 30.6  MCHC 33.8  --  33.6  --   --  33.9 34.2 32.4  RDW 13.6  --  13.6  --   --  13.8 13.7 13.7   < > = values in this interval not displayed.    Electrolytes Recent Labs  Lab 06/11/21 1800 06/12/21 0145 06/12/21 0442 06/12/21 1016 06/12/21 1115 06/12/21 1118 06/12/21 1410 06/12/21 1627 06/13/21 0044 06/13/21 0422 06/13/21 0841 06/14/21 0703 06/14/21 1719  06/15/21 0309 06/16/21 0208  NA  --   --   --    < > 139  --    < > 137 138 139 140 141  --  143 140  K  --   --   --    < > 3.0*  --    < > 3.6 3.8 3.7 3.9 3.3*  --  3.0* 4.1  CL  --   --   --   --  104  --   --  105 108  --   --  106  --  102 102  CO2  --   --   --   --  24  --   --  22 21*  --   --  25  --  31 30  GLUCOSE  --   --   --   --  172*  --   --  163* 192*  --   --  164*  --  132* 122*  BUN  --   --   --   --  8  --   --  10 13  --   --  18  --  14 17  CREATININE  --   --   --   --  0.76  --   --  0.85 0.70  --   --  0.69  --  0.66 0.63  CALCIUM  --   --   --   --  7.9*  --   --  8.0* 8.2*  --   --  7.6*  --  7.8* 8.3*  AST  --   --   --   --  116*  --   --   --   --   --   --   --   --   --   --   ALT  --   --   --   --  35  --   --   --   --   --   --   --   --   --   --   ALKPHOS  --   --   --   --  117  --   --   --   --   --   --   --   --   --   --   BILITOT  --   --   --   --  1.3*  --   --   --   --   --   --   --   --   --   --   ALBUMIN  --   --   --   --  2.8*  --   --   --   --   --   --   --   --   --   --   MG  --   --   --   --  1.8  --   --   --  2.7*  --   --   --   --  2.1 2.3  PROCALCITON  --  <0.10 <0.10  --  1.21  --   --   --  5.47  --   --   --   --   --   --   LATICACIDVEN 0.9  --   --   --  1.4  --   --   --   --   --   --   --   --   --   --   INR 1.1  --   --   --   --   --   --   --   --   --   --   --   --   --   --   HGBA1C  --   --   --   --   --  5.4  --   --   --   --   --   --   --   --   --  BNP  --   --   --   --  271.8*  --   --   --   --   --   --   --  541.6*  --   --    < > = values in this interval not displayed.    ------------------------------------------------------------------------------------------------------------------ No results for input(s): CHOL, HDL, LDLCALC, TRIG, CHOLHDL, LDLDIRECT in the last 72 hours.  Lab Results  Component Value Date   HGBA1C 5.4 06/12/2021     Radiology Reports DG CHEST PORT 1  VIEW  Result Date: 06/12/2021 CLINICAL DATA:  Post bronchial filler lavage EXAM: PORTABLE CHEST 1 VIEW COMPARISON:  Portable exam 1637 hours compared to 0908 hours FINDINGS: Tip of endotracheal tube projects 4.4 cm above carina. Feeding tube extends into stomach. Stable heart size and mediastinal contours. BILATERAL pulmonary infiltrates, slightly improved. No pleural effusion or pneumothorax. Skin folds project over LEFT upper lobe. IMPRESSION: Diffuse BILATERAL pulmonary infiltrates, slightly improved. Electronically Signed   By: Lavonia Dana M.D.   On: 06/12/2021 16:54   DG Chest Portable 1 View  Result Date: 06/12/2021 CLINICAL DATA:  Evaluate ET tube placement EXAM: PORTABLE CHEST 1 VIEW COMPARISON:  06/11/2021 FINDINGS: The ET tube tip is above the carina. Enteric tube courses below the level of the hemidiaphragms with side port approximately 6 cm below the GE junction. Stable cardiomediastinal contours. There is been significant interval increase in bilateral interstitial and airspace opacities involving the upper and lower lung zones. The visualized osseous structures appear intact. IMPRESSION: 1. Satisfactory position of ET tube. 2. Significant interval increase in bilateral interstitial and airspace opacities involving the upper and lower lung zones compatible with worsening pulmonary edema versus multifocal infection. Electronically Signed   By: Kerby Moors M.D.   On: 06/12/2021 09:22   DG Abd Portable 1V  Result Date: 06/12/2021 CLINICAL DATA:  Feeding tube placement EXAM: PORTABLE ABDOMEN - 1 VIEW COMPARISON:  None. FINDINGS: Feeding tube extends the stomach. Tip at the gastric antrum. Gas-filled loops of small bowel distension. Bibasilar airspace disease. IMPRESSION: Feeding tube with tip at the gastric antrum. Electronically Signed   By: Suzy Bouchard M.D.   On: 06/12/2021 11:56   ECHOCARDIOGRAM COMPLETE  Result Date: 06/12/2021    ECHOCARDIOGRAM REPORT   Patient Name:   MICHELLA MONTEON Date of Exam: 06/12/2021 Medical Rec #:  [id number]       Height:       66.0 in Accession #:    [id number]      Weight:       143.3 lb Date of Birth:  1951-04-14      BSA:          1.736 m Patient Age:    5 years        BP:           198/80 mmHg Patient Gender: F               HR:           87 bpm. Exam Location:  Inpatient Procedure: 2D Echo, Cardiac Doppler, Color Doppler and Intracardiac            Opacification Agent  Results communicated to Dr Tacy Learn at 18:00 on 06/12/21. Indications:    Respiratory distress  History:        Patient has no prior history of Echocardiogram examinations.                 Signs/Symptoms:Chest Pain  and Dyspnea.  Sonographer:    Cleatis Polka Referring Phys: [id number] SARAH F GROCE IMPRESSIONS  1. Left ventricular ejection fraction, by estimation, is 25 to 30%. The left ventricle has severely decreased function. The left ventricle demonstrates regional wall motion abnormalities (see scoring diagram/findings for description). Left ventricular diastolic parameters are consistent with Grade I diastolic dysfunction (impaired relaxation).  2. Right ventricular systolic function is mildly reduced. The right ventricular size is normal. There is mildly elevated pulmonary artery systolic pressure. The estimated right ventricular systolic pressure is 38.2 mmHg.  3. The mitral valve is normal in structure. Trivial mitral valve regurgitation. No evidence of mitral stenosis.  4. The aortic valve is tricuspid. Aortic valve regurgitation is mild. Aortic valve sclerosis/calcification is present, without any evidence of aortic stenosis.  5. The inferior vena cava is dilated in size with <50% respiratory variability, suggesting right atrial pressure of 15 mmHg. FINDINGS  Left Ventricle: Left ventricular ejection fraction, by estimation, is 25 to 30%. The left ventricle has severely decreased function. The left ventricle demonstrates regional wall motion abnormalities. The left ventricular internal cavity  size was normal  in size. There is no left ventricular hypertrophy. Left ventricular diastolic parameters are consistent with Grade I diastolic dysfunction (impaired relaxation).  LV Wall Scoring: The mid and distal anterior wall, entire anterior septum, entire apex, mid and distal inferior wall, and mid inferoseptal segment are akinetic. The antero-lateral wall, posterior wall, basal anterior segment, basal inferior segment, and basal inferoseptal segment are normal. Right Ventricle: The right ventricular size is normal. No increase in right ventricular wall thickness. Right ventricular systolic function is mildly reduced. There is mildly elevated pulmonary artery systolic pressure. The tricuspid regurgitant velocity  is 2.41 m/s, and with an assumed right atrial pressure of 15 mmHg, the estimated right ventricular systolic pressure is 38.2 mmHg. Left Atrium: Left atrial size was normal in size. Right Atrium: Right atrial size was normal in size. Pericardium: There is no evidence of pericardial effusion. Mitral Valve: The mitral valve is normal in structure. Trivial mitral valve regurgitation. No evidence of mitral valve stenosis. Tricuspid Valve: The tricuspid valve is normal in structure. Tricuspid valve regurgitation is mild. Aortic Valve: The aortic valve is tricuspid. Aortic valve regurgitation is mild. Aortic regurgitation PHT measures 448 msec. Aortic valve sclerosis/calcification is present, without any evidence of aortic stenosis. Aortic valve peak gradient measures 6.2  mmHg. Pulmonic Valve: The pulmonic valve was not well visualized. Pulmonic valve regurgitation is trivial. Aorta: The aortic root and ascending aorta are structurally normal, with no evidence of dilitation. Venous: The inferior vena cava is dilated in size with less than 50% respiratory variability, suggesting right atrial pressure of 15 mmHg. IAS/Shunts: The interatrial septum was not well visualized.  LEFT VENTRICLE PLAX 2D LVIDd:          5.00 cm      Diastology LVIDs:         3.60 cm      LV e' medial:    3.81 cm/s LV PW:         0.90 cm      LV E/e' medial:  12.8 LV IVS:        0.90 cm      LV e' lateral:   5.87 cm/s LVOT diam:     2.00 cm      LV E/e' lateral: 8.3 LV SV:         54 LV SV Index:   31 LVOT Area:  3.14 cm  LV Volumes (MOD) LV vol d, MOD A2C: 137.0 ml LV vol d, MOD A4C: 110.0 ml LV vol s, MOD A2C: 97.6 ml LV vol s, MOD A4C: 80.2 ml LV SV MOD A2C:     39.4 ml LV SV MOD A4C:     110.0 ml LV SV MOD BP:      34.2 ml RIGHT VENTRICLE            IVC RV Basal diam:  3.40 cm    IVC diam: 2.40 cm RV Mid diam:    2.30 cm RV S prime:     8.05 cm/s TAPSE (M-mode): 1.3 cm LEFT ATRIUM             Index        RIGHT ATRIUM           Index LA diam:        3.60 cm 2.07 cm/m   RA Area:     12.40 cm LA Vol (A2C):   54.5 ml 31.40 ml/m  RA Volume:   32.40 ml  18.67 ml/m LA Vol (A4C):   54.5 ml 31.40 ml/m LA Biplane Vol: 56.8 ml 32.73 ml/m  AORTIC VALVE AV Area (Vmax): 2.26 cm AV Vmax:        125.00 cm/s AV Peak Grad:   6.2 mmHg LVOT Vmax:      90.10 cm/s LVOT Vmean:     70.800 cm/s LVOT VTI:       0.171 m AI PHT:         448 msec  AORTA Ao Root diam: 3.20 cm Ao Asc diam:  2.70 cm MITRAL VALVE               TRICUSPID VALVE MV Area (PHT): 6.17 cm    TR Peak grad:   23.2 mmHg MV Decel Time: 123 msec    TR Vmax:        241.00 cm/s MV E velocity: 48.80 cm/s MV A velocity: 78.40 cm/s  SHUNTS MV E/A ratio:  0.62        Systemic VTI:  0.17 m                            Systemic Diam: 2.00 cm Oswaldo Milian MD Electronically signed by Oswaldo Milian MD Signature Date/Time: 06/12/2021/6:06:23 PM    Final

## 2021-06-16 NOTE — Plan of Care (Signed)

## 2021-06-16 NOTE — TOC Benefit Eligibility Note (Signed)
Patient Product/process development scientist completed.    The patient is currently admitted and upon discharge could be taking Jardiance 10 mg.  The current 30 day co-pay is, $528.12 due to a $505.00 deductible .   The patient is currently admitted and upon discharge could be taking Farxiga 10 mg.  The current 30 day co-pay is, $522.98 due to a $505.00 deductible .   The patient is currently admitted and upon discharge could be taking Entresto 24-26 mg.  The current 30 day co-pay is, $541.83due to a $505.00 deductible .  The patient is insured through [employer] Part D     Roland Earl, CPhT Pharmacy Patient Advocate Specialist [facility] Patient Advocate Team Direct Number: [phone]  Fax: [phone]

## 2021-06-16 NOTE — Progress Notes (Signed)
Progress Note  Patient Name: Veronica Romero Date of Encounter: 06/16/2021  Primary Cardiologist: Berniece Salines, DO   Subjective   Patient seen and examined at her bedside. I did see the patient pre-cath. Now post LHC.    Inpatient Medications    Scheduled Meds:  aspirin EC  81 mg Oral Daily   Chlorhexidine Gluconate Cloth  6 each Topical Daily   enoxaparin (LOVENOX) injection  1 mg/kg Subcutaneous Q12H   famotidine  20 mg Oral BID   feeding supplement  237 mL Oral BID BM   folic acid  1 mg Oral Daily   hydrocortisone sod succinate (SOLU-CORTEF) inj  50 mg Intravenous Q12H   insulin aspart  0-9 Units Subcutaneous TID WC   mouth rinse  15 mL Mouth Rinse BID   multivitamin with minerals  1 tablet Oral Daily   potassium & sodium phosphates  1 packet Oral TID WC & HS   [START ON 06/17/2021] predniSONE  5 mg Oral Q breakfast   rosuvastatin  10 mg Oral Daily   spironolactone  12.5 mg Oral Daily   thiamine  100 mg Oral Daily   Continuous Infusions:  sodium chloride     piperacillin-tazobactam (ZOSYN)  IV 3.375 g (06/16/21 0603)   PRN Meds: acetaminophen **OR** acetaminophen, benzonatate, docusate, levalbuterol, ondansetron (ZOFRAN) IV, polyethylene glycol   Vital Signs    Vitals:   06/16/21 1018 06/16/21 1023 06/16/21 1028 06/16/21 1039  BP: (!) 134/56 119/65 119/65 (!) 112/56  Pulse: 77 72 (!) 0 74  Resp: 18 17  18   Temp:    98 F (36.7 C)  TempSrc:    Oral  SpO2: 96%   91%  Weight:      Height:        Intake/Output Summary (Last 24 hours) at 06/16/2021 1042 Last data filed at 06/16/2021 0858 Gross per 24 hour  Intake 341.88 ml  Output 2300 ml  Net -1958.12 ml   Filed Weights   06/12/21 0926 06/14/21 0333 06/16/21 0541  Weight: 65 kg 75.4 kg 73.5 kg    Telemetry    Sinus rhythm- Personally Reviewed  ECG    None today - Personally Reviewed  Physical Exam    General: Comfortable,  Head: Atraumatic, normal size  Eyes: PEERLA, EOMI  Neck: Supple,  normal JVD Cardiac: Normal S1, S2; RRR; no murmurs, rubs, or gallops Lungs: Clear to auscultation bilaterally Abd: Soft, nontender, no hepatomegaly  Ext: warm, no edema Musculoskeletal: No deformities, BUE and BLE strength normal and equal Skin: Warm and dry, no rashes   Neuro: Alert and oriented to person, place, time, and situation, CNII-XII grossly intact, no focal deficits  Psych: Normal mood and affect   Labs    Chemistry Recent Labs  Lab 06/12/21 1115 06/12/21 1410 06/14/21 0703 06/15/21 0309 06/16/21 0208  NA 139   < > 141 143 140  K 3.0*   < > 3.3* 3.0* 4.1  CL 104   < > 106 102 102  CO2 24   < > 25 31 30   GLUCOSE 172*   < > 164* 132* 122*  BUN 8   < > 18 14 17   CREATININE 0.76   < > 0.69 0.66 0.63  CALCIUM 7.9*   < > 7.6* 7.8* 8.3*  PROT 6.7  --   --   --   --   ALBUMIN 2.8*  --   --   --   --   AST 116*  --   --   --   --  ALT 35  --   --   --   --   ALKPHOS 117  --   --   --   --   BILITOT 1.3*  --   --   --   --   GFRNONAA >60   < > >60 >60 >60  ANIONGAP 11   < > 10 10 8    < > = values in this interval not displayed.     Hematology Recent Labs  Lab 06/14/21 0703 06/15/21 0309 06/16/21 0208  WBC 13.9* 11.8* 11.8*  RBC 3.08* 2.96* 3.10*  HGB 9.7* 9.3* 9.5*  HCT 28.6* 27.2* 29.3*  MCV 92.9 91.9 94.5  MCH 31.5 31.4 30.6  MCHC 33.9 34.2 32.4  RDW 13.8 13.7 13.7  PLT 352 357 343    Cardiac EnzymesNo results for input(s): TROPONINI in the last 168 hours. No results for input(s): TROPIPOC in the last 168 hours.   BNP Recent Labs  Lab 06/12/21 1115 06/14/21 1719  BNP 271.8* 541.6*     DDimer No results for input(s): DDIMER in the last 168 hours.   Radiology    No results found.  Cardiac Studies   TTE 06/12/2021 IMPRESSIONS   1. Left ventricular ejection fraction, by estimation, is 25 to 30%. The  left ventricle has severely decreased function. The left ventricle  demonstrates regional wall motion abnormalities (see scoring   diagram/findings for description). Left ventricular  diastolic parameters are consistent with Grade I diastolic dysfunction  (impaired relaxation).   2. Right ventricular systolic function is mildly reduced. The right  ventricular size is normal. There is mildly elevated pulmonary artery  systolic pressure. The estimated right ventricular systolic pressure is  0000000 mmHg.   3. The mitral valve is normal in structure. Trivial mitral valve  regurgitation. No evidence of mitral stenosis.   4. The aortic valve is tricuspid. Aortic valve regurgitation is mild.  Aortic valve sclerosis/calcification is present, without any evidence of  aortic stenosis.   5. The inferior vena cava is dilated in size with <50% respiratory  variability, suggesting right atrial pressure of 15 mmHg.   FINDINGS   Left Ventricle: Left ventricular ejection fraction, by estimation, is 25  to 30%. The left ventricle has severely decreased function. The left  ventricle demonstrates regional wall motion abnormalities. The left  ventricular internal cavity size was normal   in size. There is no left ventricular hypertrophy. Left ventricular  diastolic parameters are consistent with Grade I diastolic dysfunction  (impaired relaxation).      LV Wall Scoring:  The mid and distal anterior wall, entire anterior septum, entire apex, mid  and distal inferior wall, and mid inferoseptal segment are akinetic. The  antero-lateral wall, posterior wall, basal anterior segment, basal  inferior  segment, and basal inferoseptal segment are normal.   Right Ventricle: The right ventricular size is normal. No increase in  right ventricular wall thickness. Right ventricular systolic function is  mildly reduced. There is mildly elevated pulmonary artery systolic  pressure. The tricuspid regurgitant velocity   is 2.41 m/s, and with an assumed right atrial pressure of 15 mmHg, the  estimated right ventricular systolic pressure is 0000000  mmHg.   Left Atrium: Left atrial size was normal in size.   Right Atrium: Right atrial size was normal in size.   Pericardium: There is no evidence of pericardial effusion.   Mitral Valve: The mitral valve is normal in structure. Trivial mitral  valve regurgitation. No evidence of mitral  valve stenosis.   Tricuspid Valve: The tricuspid valve is normal in structure. Tricuspid  valve regurgitation is mild.   Aortic Valve: The aortic valve is tricuspid. Aortic valve regurgitation is  mild. Aortic regurgitation PHT measures 448 msec. Aortic valve  sclerosis/calcification is present, without any evidence of aortic  stenosis. Aortic valve peak gradient measures 6.2   mmHg.   Pulmonic Valve: The pulmonic valve was not well visualized. Pulmonic valve  regurgitation is trivial.   Aorta: The aortic root and ascending aorta are structurally normal, with  no evidence of dilitation.   Venous: The inferior vena cava is dilated in size with less than 50%  respiratory variability, suggesting right atrial pressure of 15 mmHg.   IAS/Shunts: The interatrial septum was not well visualized.   Patient Profile     71 y.o. female rheumatoid arthritis who presented for acute respiratory failure was intubated shortly now extubated with improvement of her respiratory failure.  Noted with elevated troponin, recent echocardiogram showed depressed ejection fraction with wall motion abnormalities.  Assessment & Plan    CAD with severe circumflex and LAD coronary calcification in the setting of NSTEMI Acute systolic heart failure Dilated cardiomyopathy EF 25-30% with wall motion abnormalities Acute hypoxic respiratory failure which is improving still on nasal cannula Community-acquired pneumonia Rheumatoid arthritis  Noted coronary artery disease with 85% proximal LAD stenosis, with other moderate disease for right now optimization of medical therapy as well as respiratory status plan for elective PCI in  the next weeks to come.  Continue aspirin 81 mg daily, increase Crestor to 20 mg daily.    She has been responding to the diuretics however held this morning due to the heart catheterization.  We will restart Lasix 40 mg daily IV.  Overnight output -1958 mL  In terms of ischemic cardiomyopathy, we cut back on the Aldactone 12.5 yesterday, her blood pressure is still marginal however for guideline medical therapy will benefit from Toprol-XL 12.5 mg daily, Entresto 24-26 mg twice daily and Farxiga.  These can be started any order   Electrolyte abnormalities Please continue to replete aggressively to keep K above 4 Antibiotics being managed by the primary team.    For questions or updates, please contact [place] Please consult [email or web address] for contact info under Cardiology/STEMI.      Signed, Berniece Salines, DO  06/16/2021, 10:42 AM

## 2021-06-16 NOTE — TOC Initial Note (Signed)
Transition of Care [facility]) - Initial/Assessment Note    Patient Details  Name: Veronica Romero MRN: [id number] Date of Birth: 07-22-1950  [facility] CM/SW Contact:    Angelita Ingles, RN Phone [phone]  06/16/2021, 3:29 PM  Clinical Narrative:       CM at bedside to offer choice for home health. Medicare.gov list provided. Patient and daughter have no preference for [facility] agency. HH referral accepted by [facility] with [facility]. Info has been added to AVS.      Patient Goals and CMS Choice        Expected Discharge Plan and Services                                                Prior Living Arrangements/Services                       Activities of Daily Living Home Assistive Devices/Equipment: None ADL Screening (condition at time of admission) Patient's cognitive ability adequate to safely complete daily activities?: No Is the patient deaf or have difficulty hearing?: No Does the patient have difficulty seeing, even when wearing glasses/contacts?: No Does the patient have difficulty concentrating, remembering, or making decisions?: Yes Patient able to express need for assistance with ADLs?: No Does the patient have difficulty dressing or bathing?: Yes Independently performs ADLs?: No Communication: Dependent Is this a change from baseline?: Change from baseline, expected to last >3 days Dressing (OT): Needs assistance Is this a change from baseline?: Change from baseline, expected to last >3 days Grooming: Needs assistance Is this a change from baseline?: Change from baseline, expected to last >3 days Feeding: Needs assistance Is this a change from baseline?: Change from baseline, expected to last >3 days Bathing: Needs assistance Is this a change from baseline?: Change from baseline, expected to last >3 days Toileting: Needs assistance Is this a change from baseline?: Change from baseline, expected to last >3days In/Out Bed:  Needs assistance Is this a change from baseline?: Change from baseline, expected to last >3 days Walks in Home: Independent Does the patient have difficulty walking or climbing stairs?: Yes Weakness of Legs: Both Weakness of Arms/Hands: Both  Permission Sought/Granted                  Emotional Assessment              Admission diagnosis:  Acute respiratory failure ( [place]) [J96.00] Acute respiratory failure with hypoxia ([place]) [J96.01] Multifocal pneumonia [J18.9] Patient Active Problem List   Diagnosis Date Noted   Acute on chronic systolic heart failure ([place]) 06/16/2021   CAD (coronary artery disease), native coronary artery 06/16/2021   Acute respiratory failure with hypoxia and hypercapnia ([place]) 06/12/2021   Acute respiratory failure ([place]) 06/12/2021   Malnutrition of moderate degree 06/12/2021   Multifocal pneumonia 06/11/2021   Chest pain 06/11/2021   Dyspnea 06/11/2021   Rheumatoid arthritis ([place]) 06/11/2021   PCP:  Pcp, No Pharmacy:   [facility] [place], [place] [address] [place] [address] Phone: [phone] Fax: [phone]     Social Determinants of Health (SDOH) Interventions    Readmission Risk Interventions No flowsheet data found.

## 2021-06-17 ENCOUNTER — Inpatient Hospital Stay (HOSPITAL_COMMUNITY): Payer: Medicare Other

## 2021-06-17 ENCOUNTER — Encounter (HOSPITAL_COMMUNITY): Payer: Self-pay | Admitting: Interventional Cardiology

## 2021-06-17 DIAGNOSIS — I214 Non-ST elevation (NSTEMI) myocardial infarction: Secondary | ICD-10-CM | POA: Diagnosis not present

## 2021-06-17 DIAGNOSIS — I5023 Acute on chronic systolic (congestive) heart failure: Secondary | ICD-10-CM

## 2021-06-17 LAB — COMPREHENSIVE METABOLIC PANEL
ALT: 40 U/L (ref 0–44)
AST: 55 U/L — ABNORMAL HIGH (ref 15–41)
Albumin: 2.3 g/dL — ABNORMAL LOW (ref 3.5–5.0)
Alkaline Phosphatase: 70 U/L (ref 38–126)
Anion gap: 9 (ref 5–15)
BUN: 14 mg/dL (ref 8–23)
CO2: 26 mmol/L (ref 22–32)
Calcium: 8.4 mg/dL — ABNORMAL LOW (ref 8.9–10.3)
Chloride: 103 mmol/L (ref 98–111)
Creatinine, Ser: 0.69 mg/dL (ref 0.44–1.00)
GFR, Estimated: >60 mL/min (ref >60)
Glucose, Bld: 120 mg/dL — ABNORMAL HIGH (ref 70–99)
Potassium: 3.3 mmol/L — ABNORMAL LOW (ref 3.5–5.1)
Sodium: 138 mmol/L (ref 135–145)
Total Bilirubin: 0.5 mg/dL (ref 0.3–1.2)
Total Protein: 6.2 g/dL — ABNORMAL LOW (ref 6.5–8.1)

## 2021-06-17 LAB — CBC WITH DIFFERENTIAL/PLATELET
Abs Immature Granulocytes: 0.12 10*3/uL — ABNORMAL HIGH (ref 0.00–0.07)
Basophils Absolute: 0.1 10*3/uL (ref 0.0–0.1)
Basophils Relative: 1 %
Eosinophils Absolute: 0.4 10*3/uL (ref 0.0–0.5)
Eosinophils Relative: 4 %
HCT: 27.8 % — ABNORMAL LOW (ref 36.0–46.0)
Hemoglobin: 9.3 g/dL — ABNORMAL LOW (ref 12.0–15.0)
Immature Granulocytes: 1 %
Lymphocytes Relative: 34 %
Lymphs Abs: 3.8 10*3/uL (ref 0.7–4.0)
MCH: 31.2 pg (ref 26.0–34.0)
MCHC: 33.5 g/dL (ref 30.0–36.0)
MCV: 93.3 fL (ref 80.0–100.0)
Monocytes Absolute: 1.1 10*3/uL — ABNORMAL HIGH (ref 0.1–1.0)
Monocytes Relative: 10 %
Neutro Abs: 5.7 10*3/uL (ref 1.7–7.7)
Neutrophils Relative %: 50 %
Platelets: 364 10*3/uL (ref 150–400)
RBC: 2.98 MIL/uL — ABNORMAL LOW (ref 3.87–5.11)
RDW: 13.7 % (ref 11.5–15.5)
WBC: 11.1 10*3/uL — ABNORMAL HIGH (ref 4.0–10.5)
nRBC: 0 % (ref 0.0–0.2)

## 2021-06-17 LAB — MAGNESIUM: Magnesium: 2.3 mg/dL (ref 1.7–2.4)

## 2021-06-17 LAB — BRAIN NATRIURETIC PEPTIDE: B Natriuretic Peptide: 697.8 pg/mL — ABNORMAL HIGH (ref 0.0–100.0)

## 2021-06-17 LAB — GLUCOSE, CAPILLARY
Glucose-Capillary: 155 mg/dL — ABNORMAL HIGH (ref 70–99)
Glucose-Capillary: 130 mg/dL — ABNORMAL HIGH (ref 70–99)
Glucose-Capillary: 131 mg/dL — ABNORMAL HIGH (ref 70–99)
Glucose-Capillary: 91 mg/dL (ref 70–99)

## 2021-06-17 MED ORDER — FUROSEMIDE 10 MG/ML IJ SOLN
40.0000 mg | Freq: Once | INTRAMUSCULAR | Status: AC
  Administered 2021-06-17: 40 mg via INTRAVENOUS
  Filled 2021-06-17: qty 4

## 2021-06-17 MED ORDER — DAPAGLIFLOZIN PROPANEDIOL 10 MG PO TABS
10.0000 mg | ORAL_TABLET | Freq: Every day | ORAL | Status: DC
Start: 2021-06-17 — End: 2021-06-22
  Administered 2021-06-17 – 2021-06-22 (×6): 10 mg via ORAL
  Filled 2021-06-17 (×7): qty 1

## 2021-06-17 MED ORDER — CLOPIDOGREL BISULFATE 75 MG PO TABS
75.0000 mg | ORAL_TABLET | Freq: Every day | ORAL | Status: DC
Start: 2021-06-17 — End: 2021-06-22
  Administered 2021-06-17 – 2021-06-22 (×6): 75 mg via ORAL
  Filled 2021-06-17 (×6): qty 1

## 2021-06-17 MED ORDER — LOSARTAN POTASSIUM 25 MG PO TABS
12.5000 mg | ORAL_TABLET | Freq: Every day | ORAL | Status: DC
  Administered 2021-06-17 – 2021-06-22 (×6): 12.5 mg via ORAL
  Filled 2021-06-17 (×6): qty 1

## 2021-06-17 MED ORDER — POTASSIUM CHLORIDE CRYS ER 20 MEQ PO TBCR
40.0000 meq | EXTENDED_RELEASE_TABLET | Freq: Two times a day (BID) | ORAL | Status: DC
  Administered 2021-06-17: 40 meq via ORAL
  Filled 2021-06-17: qty 2

## 2021-06-17 MED ORDER — POLYVINYL ALCOHOL 1.4 % OP SOLN
1.0000 [drp] | OPHTHALMIC | Status: DC | PRN
  Administered 2021-06-17: 1 [drp] via OPHTHALMIC
  Filled 2021-06-17: qty 15

## 2021-06-17 MED ORDER — SALINE SPRAY 0.65 % NA SOLN
1.0000 | NASAL | Status: DC | PRN
  Administered 2021-06-17: 1 via NASAL
  Filled 2021-06-17: qty 44

## 2021-06-17 MED ORDER — POTASSIUM CHLORIDE CRYS ER 20 MEQ PO TBCR
20.0000 meq | EXTENDED_RELEASE_TABLET | Freq: Once | ORAL | Status: AC
  Administered 2021-06-17: 20 meq via ORAL
  Filled 2021-06-17: qty 1

## 2021-06-17 MED ORDER — DIGOXIN 125 MCG PO TABS
0.1250 mg | ORAL_TABLET | Freq: Every day | ORAL | Status: DC
  Administered 2021-06-17 – 2021-06-22 (×6): 0.125 mg via ORAL
  Filled 2021-06-17 (×6): qty 1

## 2021-06-17 NOTE — Progress Notes (Signed)
Cardiology Progress Note  Patient ID: Veronica Romero MRN: [id number] DOB: January 23, 1951 Date of Encounter: 06/17/2021  Primary Cardiologist: Veronica Salines, DO  Subjective   Chief Complaint: Shortness of breath  HPI: Crackles in the lung fields.  Needs diuresis.  ROS:  All other ROS reviewed and negative. Pertinent positives noted in the HPI.     Inpatient Medications  Scheduled Meds:  aspirin EC  81 mg Oral Daily   Chlorhexidine Gluconate Cloth  6 each Topical Daily   clopidogrel  75 mg Oral Daily   enoxaparin (LOVENOX) injection  40 mg Subcutaneous QHS   famotidine  20 mg Oral BID   feeding supplement  237 mL Oral BID BM   folic acid  1 mg Oral Daily   furosemide  40 mg Intravenous Once   insulin aspart  0-9 Units Subcutaneous TID WC   mouth rinse  15 mL Mouth Rinse BID   multivitamin with minerals  1 tablet Oral Daily   potassium chloride  40 mEq Oral BID   predniSONE  5 mg Oral Q breakfast   rosuvastatin  10 mg Oral Daily   sodium chloride flush  3 mL Intravenous Q12H   spironolactone  12.5 mg Oral Daily   thiamine  100 mg Oral Daily   Continuous Infusions:  sodium chloride     piperacillin-tazobactam (ZOSYN)  IV 3.375 g (06/17/21 0639)   PRN Meds: acetaminophen **OR** acetaminophen, acetaminophen, benzonatate, docusate, levalbuterol, menthol-cetylpyridinium, ondansetron (ZOFRAN) IV, polyethylene glycol, sodium chloride flush   Vital Signs   Vitals:   06/16/21 1949 06/16/21 2357 06/17/21 0518 06/17/21 0740  BP:  (!) 107/53 130/62 (!) 107/47  Pulse:  75 82 85  Resp:  20 20 20   Temp:  98.6 F (37 C) 98.5 F (36.9 C) 98.1 F (36.7 C)  TempSrc:  Oral Oral Oral  SpO2: 93% 94% 93% 95%  Weight:   71.1 kg   Height:        Intake/Output Summary (Last 24 hours) at 06/17/2021 [id number] Last data filed at 06/17/2021 0323 Gross per 24 hour  Intake 620.55 ml  Output 150 ml  Net 470.55 ml   Last 3 Weights 06/17/2021 06/16/2021 06/14/2021  Weight (lbs) 156 lb 12 oz 162 lb  0.6 oz 166 lb 3.6 oz  Weight (kg) 71.1 kg 73.5 kg 75.4 kg      Telemetry  Overnight telemetry shows sinus rhythm in the 80s, which I personally reviewed.   Physical Exam   Vitals:   06/16/21 1949 06/16/21 2357 06/17/21 0518 06/17/21 0740  BP:  (!) 107/53 130/62 (!) 107/47  Pulse:  75 82 85  Resp:  20 20 20   Temp:  98.6 F (37 C) 98.5 F (36.9 C) 98.1 F (36.7 C)  TempSrc:  Oral Oral Oral  SpO2: 93% 94% 93% 95%  Weight:   71.1 kg   Height:        Intake/Output Summary (Last 24 hours) at 06/17/2021 [id number] Last data filed at 06/17/2021 0323 Gross per 24 hour  Intake 620.55 ml  Output 150 ml  Net 470.55 ml    Last 3 Weights 06/17/2021 06/16/2021 06/14/2021  Weight (lbs) 156 lb 12 oz 162 lb 0.6 oz 166 lb 3.6 oz  Weight (kg) 71.1 kg 73.5 kg 75.4 kg    Body mass index is 25.3 kg/m.   General: Well nourished, well developed, in no acute distress Head: Atraumatic, normal size  Eyes: PEERLA, EOMI  Neck: Supple, JVD 10-12 cmH2O Endocrine: No thryomegaly  Cardiac: Normal S1, S2; RRR; no murmurs, rubs, or gallops Lungs: Crackles present bilaterally Abd: Soft, nontender, no hepatomegaly  Ext: No edema, pulses 2+ Musculoskeletal: No deformities, BUE and BLE strength normal and equal Skin: Warm and dry, no rashes   Neuro: Alert and oriented to person, place, time, and situation, CNII-XII grossly intact, no focal deficits  Psych: Normal mood and affect   Labs  High Sensitivity Troponin:   Recent Labs  Lab 06/11/21 1645 06/11/21 2229 06/12/21 1115 06/12/21 1627 06/13/21 0553  TROPONINIHS 25* 19* 768* 3,044* 1,806*     Cardiac EnzymesNo results for input(s): TROPONINI in the last 168 hours. No results for input(s): TROPIPOC in the last 168 hours.  Chemistry Recent Labs  Lab 06/12/21 1115 06/12/21 1410 06/15/21 0309 06/16/21 0208 06/16/21 1001 06/16/21 1004 06/16/21 1010 06/17/21 0132  NA 139   < > 143 140   < > 142 141 138  K 3.0*   < > 3.0* 4.1   < > 3.2* 3.3* 3.3*   CL 104   < > 102 102  --   --   --  103  CO2 24   < > 31 30  --   --   --  26  GLUCOSE 172*   < > 132* 122*  --   --   --  120*  BUN 8   < > 14 17  --   --   --  14  CREATININE 0.76   < > 0.66 0.63  --   --   --  0.69  CALCIUM 7.9*   < > 7.8* 8.3*  --   --   --  8.4*  PROT 6.7  --   --   --   --   --   --  6.2*  ALBUMIN 2.8*  --   --   --   --   --   --  2.3*  AST 116*  --   --   --   --   --   --  55*  ALT 35  --   --   --   --   --   --  40  ALKPHOS 117  --   --   --   --   --   --  70  BILITOT 1.3*  --   --   --   --   --   --  0.5  GFRNONAA >60   < > >60 >60  --   --   --  >60  ANIONGAP 11   < > 10 8  --   --   --  9   < > = values in this interval not displayed.    Hematology Recent Labs  Lab 06/15/21 0309 06/16/21 0208 06/16/21 1001 06/16/21 1004 06/16/21 1010 06/17/21 0132  WBC 11.8* 11.8*  --   --   --  11.1*  RBC 2.96* 3.10*  --   --   --  2.98*  HGB 9.3* 9.5*   < > 9.2* 9.2* 9.3*  HCT 27.2* 29.3*   < > 27.0* 27.0* 27.8*  MCV 91.9 94.5  --   --   --  93.3  MCH 31.4 30.6  --   --   --  31.2  MCHC 34.2 32.4  --   --   --  33.5  RDW 13.7 13.7  --   --   --  13.7  PLT 357 343  --   --   --  364   < > = values in this interval not displayed.   BNP Recent Labs  Lab 06/12/21 1115 06/14/21 1719 06/17/21 0132  BNP 271.8* 541.6* 697.8*    DDimer No results for input(s): DDIMER in the last 168 hours.   Radiology  CARDIAC CATHETERIZATION  Addendum Date: 06/16/2021     Ost LAD to Mid LAD lesion is 85% stenosed.   Prox RCA lesion is 50% stenosed.   LPAV lesion is 50% stenosed.   There is mild to moderate left ventricular systolic dysfunction.   LV end diastolic pressure is normal.   LV end diastolic pressure is normal.   The left ventricular ejection fraction is 35-45% by visual estimate.   Hemodynamic findings consistent with mild pulmonary hypertension. CONCLUSIONS: Calcified segmental 85% proximal LAD Dominant circumflex without significant obstruction.  Distally  before the PDA there is segmental 50% narrowing. Left main is widely patent Right coronary was poorly visualized, is nondominant, and contains mid 50% stenosis. Low normal to mild systolic dysfunction with EF in the 35 to 45% range.  LVEDP 14 mmHg. Mild pulmonary hypertension with mean PA pressure 22 mmHg; mean wedge pressure 14 mmHg; right atrial mean 6 mmHg.  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 RECOMMENDATION: Consider starting dual antiplatelet therapy if felt safe.  I will recommend Plavix. Continue to treat heart failure and recuperate from pneumonia. Active orbital atherectomy and stenting of the LAD and days 2 weeks once the patient is stronger. Earlier management if the patient has ischemic symptoms. LV function is improving and is clearly better than 25 to 30% on this occasion as noted above. Hemodynamics are reasonable but would start basic guideline directed therapy for heart failure.   Result Date: 06/16/2021   Ost LAD to Mid LAD lesion is 85% stenosed.   Prox RCA lesion is 50% stenosed.   LPAV lesion is 50% stenosed.   There is mild to moderate left ventricular systolic dysfunction.   LV end diastolic pressure is normal.   LV end diastolic pressure is normal.   The left ventricular ejection fraction is 35-45% by visual estimate.   Hemodynamic findings consistent with mild pulmonary hypertension. CONCLUSIONS: Calcified segmental 85% proximal LAD Dominant circumflex without significant obstruction.  Distally before the PDA there is segmental 50% narrowing. Left main is widely patent Right coronary was poorly visualized, is nondominant, and contains mid 50% stenosis. Low normal to mild systolic dysfunction with EF in the 35 to 45% range.  LVEDP 14 mmHg. Mild pulmonary hypertension with mean PA pressure 22 mmHg; mean wedge pressure 14 mmHg; right atrial mean 6 mmHg.  Pulmonary hypertension group 2. RECOMMENDATION: Consider starting dual antiplatelet therapy if felt safe.  I will recommend Plavix. Continue  to treat heart failure and recuperate from pneumonia. Active orbital atherectomy and stenting of the LAD and days 2 weeks once the patient is stronger. Earlier management if the patient has ischemic symptoms. LV function is improving and is clearly better than 25 to 30% on this occasion as noted above. Hemodynamics are reasonable but would start basic guideline directed therapy for heart failure.  DG Chest Port 1 View  Result Date: 06/17/2021 CLINICAL DATA:  Shortness of breath. EXAM: PORTABLE CHEST 1 VIEW COMPARISON:  06/12/2021 FINDINGS: Heart size is normal. Bilateral interstitial and airspace opacities are again noted and are unchanged compared with the previous exam. Lung volumes remain low. No pleural effusion identified. IMPRESSION: No change in aeration to the lungs compared with previous exam. Electronically Signed   By:  Kerby Moors M.D.   On: 06/17/2021 07:11    Cardiac Studies  LHC 06/16/2021 CONCLUSIONS: Calcified segmental 85% proximal LAD Dominant circumflex without significant obstruction.  Distally before the PDA there is segmental 50% narrowing. Left main is widely patent Right coronary was poorly visualized, is nondominant, and contains mid 50% stenosis. Low normal to mild systolic dysfunction with EF in the 35 to 45% range.  LVEDP 14 mmHg. Mild pulmonary hypertension with mean PA pressure 22 mmHg; mean wedge pressure 14 mmHg; right atrial mean 6 mmHg.  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   RECOMMENDATION: Consider starting dual antiplatelet therapy if felt safe.  I will recommend Plavix. Continue to treat heart failure and recuperate from pneumonia. Active orbital atherectomy and stenting of the LAD and days 2 weeks once the patient is stronger. Earlier management if the patient has ischemic symptoms. LV function is improving and is clearly better than 25 to 30% on this occasion as noted above. Hemodynamics are reasonable but would start basic guideline directed therapy for  heart failure.  TTE 06/12/2021  1. Left ventricular ejection fraction, by estimation, is 25 to 30%. The  left ventricle has severely decreased function. The left ventricle  demonstrates regional wall motion abnormalities (see scoring  diagram/findings for description). Left ventricular  diastolic parameters are consistent with Grade I diastolic dysfunction  (impaired relaxation).   2. Right ventricular systolic function is mildly reduced. The right  ventricular size is normal. There is mildly elevated pulmonary artery  systolic pressure. The estimated right ventricular systolic pressure is  0000000 mmHg.   3. The mitral valve is normal in structure. Trivial mitral valve  regurgitation. No evidence of mitral stenosis.   4. The aortic valve is tricuspid. Aortic valve regurgitation is mild.  Aortic valve sclerosis/calcification is present, without any evidence of  aortic stenosis.   5. The inferior vena cava is dilated in size with <50% respiratory  variability, suggesting right atrial pressure of 15 mmHg.   Patient Profile  Veronica Romero is a 71 y.o. female with rheumatoid arthritis who was admitted on 06/14/2021 with acute hypoxic respiratory failure secondary to pneumonia.  Found to have new onset systolic heart failure.  Left heart catheterization with severe LAD stenosis.  Assessment & Plan   #Non-STEMI -Suspect this was secondary to pneumonia and likely congestive heart failure. -Left heart catheterization with severe proximal LAD stenosis.  Plan is for medical management and then staged intervention. -Continue aspirin 81 mg daily.  Continue Plavix and 5 mg daily. -She is on a statin. -Titrating heart failure medications.  We will likely add a beta-blocker tomorrow.  See discussion below.  #Acute systolic heart failure, EF 25-30% -Only on Aldactone.  BPs have been soft. -Add digoxin 0.125 mg daily.  We will add losartan 12.5 mg daily.  Continue Aldactone 12.5 mg daily. -I will add  a beta-blocker tomorrow. -BPs are soft.  Unclear if she will tolerate Entresto.  We will see how she does on losartan and digoxin.  We may trial her on Entresto while here. -Add Farxiga 10 mg daily. -Volume up on exam.  Wedge 14 mmHg.  Elevated pulmonary pressures.  Agree with IV Lasix as a one-time dose.  We can reassess tomorrow. -Suspect her heart failure is related to ischemia.  Hopefully this will improve with PCI of the LAD in the coming weeks.  #PNA -per hospital medicine.   For questions or updates, please contact [place] Please consult [email or web address] for contact info under  Signed, Addison Naegeli. Audie Box, MD, [place]  06/17/2021 8:22 AM

## 2021-06-17 NOTE — Progress Notes (Signed)
PROGRESS NOTE                                                                                                                                                                                                             Patient Demographics:    Veronica Romero, is a 71 y.o. female, DOB - 05/02/1950, [id number]  Outpatient Primary MD for the patient is Pcp, No    LOS - 6  Admit date - 06/11/2021    Chief Complaint  Patient presents with   Cough   Chest Pain   Shortness of Breath       Brief Narrative (HPI from H&P)   71 yo F PMH RA on methotrexate, prednisone, humira, recent COVID-19 infection with minimal sx presented to [facility] 06/11/21 with SOB and cough.  She rapidly developed worsening of her respiratory status and she was intubated and placed in ICU, she underwent diagnostic bronchoscopy, was given IV antibiotics was also seen by cardiology team for elevated troponin and possible heart failure.  She was extubated on 06/14/2021 and transferred to hospitalist service on 06/16/2021.   Subjective:   Patient in bed, appears comfortable, denies any headache, no fever, no chest pain or pressure, no shortness of breath at rest but gets short of breath laying flat, no abdominal pain. No new focal weakness.    Assessment  & Plan :    Acute Hypoxic Resp. Failure due to CAP in a patient who is immunocompromised due to combination of Humira, prednisone, methotrexate for her underlying RA - she was initially intubated for 3 days and finally extubated on 06/14/2021, underwent diagnostic bronchoscopy, she was on combination of azithromycin and Zosyn and stop dates placed by ICU team, azithromycin total 5-day course and Zosyn total 7 days.  Currently on nasal cannula oxygen 3 to 4 L along with supportive care with nebulizer treatments.  Steroids have been tapered off note she takes 5 mg of prednisone at baseline for her RA.  Encouraged the  patient to sit up in chair in the daytime use I-S and flutter valve for pulmonary toiletry.  Will advance activity and titrate down oxygen as possible.  2.  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  Left heart cath done on 06/16/2021 shows significant proximal LAD calcification of close to 80%, case  discussed with cardiology group on 06/16/2021 and 06/17/2021, placed on dual antiplatelet therapy along with statin which was started on 06/16/2021, continue diuretics, currently chest pain-free likely for stent placement at a later date due to her recent pneumonia.  Continue diuresis with Lasix as tolerated.  3.  RA.  Now on her chronic 5 mg of prednisone at baseline.  Methotrexate and Humira on hold.  4.  Alcohol use.  Counseled to cut down, on vitamins, no DTs will monitor.  5.  Normocytic anemia.  Monitor.  6.  Dyslipidemia.  Placed on statin.      Condition - Extremely Guarded  Family Communication  : Daughter bedside on 06/16/2021, 06/17/21  Code Status : Full  Consults  : PCCM, cardiology  PUD Prophylaxis : Pepcid   Procedures  :     Intubated on 06/11/2021 extubated 06/14/2021.  Echocardiogram.  1. Left ventricular ejection fraction, by estimation, is 25 to 30%. The left ventricle has severely decreased function. The left ventricle demonstrates regional wall motion abnormalities (see scoring diagram/findings for description). Left ventricular diastolic parameters are consistent with Grade I diastolic dysfunction (impaired relaxation).  2. Right ventricular systolic function is mildly reduced. The right ventricular size is normal. There is mildly elevated pulmonary artery systolic pressure. The estimated right ventricular systolic pressure is 0000000 mmHg.  3. The mitral valve is normal in structure. Trivial mitral valve regurgitation. No evidence of mitral stenosis.  4. The aortic valve is tricuspid. Aortic valve regurgitation is mild. Aortic valve  sclerosis/calcification is present, without any evidence of aortic stenosis.  5. The inferior vena cava is dilated in size with <50% respiratory variability, suggesting right atrial pressure of 15 mmHg.  Left heart cath - Dr Tamala Julian 06/16/2021  CONCLUSIONS: Calcified segmental 85% proximal LAD Dominant circumflex without significant obstruction.  Distally before the PDA there is segmental 50% narrowing. Left main is widely patent Right coronary was poorly visualized, is nondominant, and contains mid 50% stenosis. Low normal to mild systolic dysfunction with EF in the 35 to 45% range.  LVEDP 14 mmHg. Mild pulmonary hypertension with mean PA pressure 22 mmHg; mean wedge pressure 14 mmHg; right atrial mean 6 mmHg.  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   RECOMMENDATION: Consider starting dual antiplatelet therapy if felt safe.  I will recommend Plavix. Continue to treat heart failure and recuperate from pneumonia. Active orbital atherectomy and stenting of the LAD and days 2 weeks once the patient is stronger. Earlier management if the patient has ischemic symptoms. LV function is improving and is clearly better than 25 to 30% on this occasion as noted above. Hemodynamics are reasonable but would start basic guideline directed therapy for heart failure.      Disposition Plan  :    Status is: Inpatient  DVT Prophylaxis  :    enoxaparin (LOVENOX) injection 40 mg Start: 06/16/21 2200 SCDs Start: 06/12/21 [id number]    Lab Results  Component Value Date   PLT 364 06/17/2021    Diet :  Diet Order             Diet Heart Room service appropriate? Yes; Fluid consistency: Thin  Diet effective now                    Inpatient Medications  Scheduled Meds:  aspirin EC  81 mg Oral Daily   Chlorhexidine Gluconate Cloth  6 each Topical Daily   clopidogrel  75 mg Oral Daily   dapagliflozin propanediol  10 mg Oral  Daily   digoxin  0.125 mg Oral Daily   enoxaparin (LOVENOX) injection  40 mg  Subcutaneous QHS   famotidine  20 mg Oral BID   feeding supplement  237 mL Oral BID BM   folic acid  1 mg Oral Daily   insulin aspart  0-9 Units Subcutaneous TID WC   losartan  12.5 mg Oral Daily   mouth rinse  15 mL Mouth Rinse BID   multivitamin with minerals  1 tablet Oral Daily   potassium chloride  20 mEq Oral Once   predniSONE  5 mg Oral Q breakfast   rosuvastatin  10 mg Oral Daily   sodium chloride flush  3 mL Intravenous Q12H   spironolactone  12.5 mg Oral Daily   thiamine  100 mg Oral Daily   Continuous Infusions:  sodium chloride     piperacillin-tazobactam (ZOSYN)  IV 3.375 g (06/17/21 0639)   PRN Meds:.acetaminophen **OR** acetaminophen, acetaminophen, benzonatate, docusate, levalbuterol, menthol-cetylpyridinium, ondansetron (ZOFRAN) IV, polyethylene glycol, polyvinyl alcohol, sodium chloride, sodium chloride flush  Antibiotics  :    Anti-infectives (From admission, onward)    Start     Dose/Rate Route Frequency Ordered Stop   06/12/21 1000  piperacillin-tazobactam (ZOSYN) IVPB 3.375 g        3.375 g 12.5 mL/hr over 240 Minutes Intravenous Every 8 hours 06/12/21 0947 06/18/21 2359   06/11/21 1800  cefTRIAXone (ROCEPHIN) 2 g in sodium chloride 0.9 % 100 mL IVPB  Status:  Discontinued        2 g 200 mL/hr over 30 Minutes Intravenous Every 24 hours 06/11/21 1749 06/12/21 0911   06/11/21 1800  azithromycin (ZITHROMAX) 500 mg in sodium chloride 0.9 % 250 mL IVPB        500 mg 250 mL/hr over 60 Minutes Intravenous Every 24 hours 06/11/21 1749 06/15/21 1951        Time Spent in minutes  30   Lala Lund M.D on 06/17/2021 at 9:30 AM  To page go to [email or web address]   [facility] -  Office  [phone]  See all Orders from today for further details    Objective:   Vitals:   06/16/21 2357 06/17/21 0518 06/17/21 0740 06/17/21 0852  BP: (!) 107/53 130/62 (!) 107/47   Pulse: 75 82 85 98  Resp: 20 20 20    Temp: 98.6 F (37 C) 98.5 F (36.9 C) 98.1 F  (36.7 C)   TempSrc: Oral Oral Oral   SpO2: 94% 93% 95%   Weight:  71.1 kg    Height:        Wt Readings from Last 3 Encounters:  06/17/21 71.1 kg     Intake/Output Summary (Last 24 hours) at 06/17/2021 0930 Last data filed at 06/17/2021 0901 Gross per 24 hour  Intake 623.55 ml  Output --  Net 623.55 ml     Physical Exam  Awake Alert, No new F.N deficits, Normal affect [place].AT,PERRAL Supple Neck, No JVD,   Symmetrical Chest wall movement, Good air movement bilaterally, ++ rales and trace leg edema RRR,No Gallops, Rubs or new Murmurs,  +ve B.Sounds, Abd Soft, No tenderness,   No Cyanosis,       Data Review:    CBC Recent Labs  Lab 06/13/21 0044 06/13/21 0422 06/14/21 0703 06/15/21 0309 06/16/21 0208 06/16/21 1001 06/16/21 1004 06/16/21 1010 06/17/21 0132  WBC 10.7*  --  13.9* 11.8* 11.8*  --   --   --  11.1*  HGB 9.8*   < >  9.7* 9.3* 9.5* 9.5* 9.2* 9.2* 9.3*  HCT 29.2*   < > 28.6* 27.2* 29.3* 28.0* 27.0* 27.0* 27.8*  PLT 296  --  352 357 343  --   --   --  364  MCV 92.7  --  92.9 91.9 94.5  --   --   --  93.3  MCH 31.1  --  31.5 31.4 30.6  --   --   --  31.2  MCHC 33.6  --  33.9 34.2 32.4  --   --   --  33.5  RDW 13.6  --  13.8 13.7 13.7  --   --   --  13.7  LYMPHSABS  --   --   --   --   --   --   --   --  3.8  MONOABS  --   --   --   --   --   --   --   --  1.1*  EOSABS  --   --   --   --   --   --   --   --  0.4  BASOSABS  --   --   --   --   --   --   --   --  0.1   < > = values in this interval not displayed.    Electrolytes Recent Labs  Lab 06/11/21 1800 06/12/21 0145 06/12/21 0442 06/12/21 1016 06/12/21 1115 06/12/21 1118 06/12/21 1410 06/13/21 0044 06/13/21 0422 06/14/21 0703 06/14/21 1719 06/15/21 0309 06/16/21 0208 06/16/21 1001 06/16/21 1004 06/16/21 1010 06/17/21 0132  NA  --   --   --    < > 139  --    < > 138   < > 141  --  143 140 142 142 141 138  K  --   --   --    < > 3.0*  --    < > 3.8   < > 3.3*  --  3.0* 4.1 3.4* 3.2*  3.3* 3.3*  CL  --   --   --   --  104  --    < > 108  --  106  --  102 102  --   --   --  103  CO2  --   --   --   --  24  --    < > 21*  --  25  --  31 30  --   --   --  26  GLUCOSE  --   --   --   --  172*  --    < > 192*  --  164*  --  132* 122*  --   --   --  120*  BUN  --   --   --   --  8  --    < > 13  --  18  --  14 17  --   --   --  14  CREATININE  --   --   --   --  0.76  --    < > 0.70  --  0.69  --  0.66 0.63  --   --   --  0.69  CALCIUM  --   --   --   --  7.9*  --    < > 8.2*  --  7.6*  --  7.8* 8.3*  --   --   --  8.4*  AST  --   --   --   --  116*  --   --   --   --   --   --   --   --   --   --   --  55*  ALT  --   --   --   --  35  --   --   --   --   --   --   --   --   --   --   --  40  ALKPHOS  --   --   --   --  117  --   --   --   --   --   --   --   --   --   --   --  70  BILITOT  --   --   --   --  1.3*  --   --   --   --   --   --   --   --   --   --   --  0.5  ALBUMIN  --   --   --   --  2.8*  --   --   --   --   --   --   --   --   --   --   --  2.3*  MG  --   --   --   --  1.8  --   --  2.7*  --   --   --  2.1 2.3  --   --   --  2.3  PROCALCITON  --  <0.10 <0.10  --  1.21  --   --  5.47  --   --   --   --   --   --   --   --   --   LATICACIDVEN 0.9  --   --   --  1.4  --   --   --   --   --   --   --   --   --   --   --   --   INR 1.1  --   --   --   --   --   --   --   --   --   --   --   --   --   --   --   --   HGBA1C  --   --   --   --   --  5.4  --   --   --   --   --   --   --   --   --   --   --   BNP  --   --   --   --  271.8*  --   --   --   --   --  541.6*  --   --   --   --   --  697.8*   < > = values in this interval not displayed.    ------------------------------------------------------------------------------------------------------------------ Recent Labs    06/16/21 0208  CHOL 177  HDL 38*  LDLCALC 116*  TRIG 115  CHOLHDL 4.7    Lab Results  Component Value Date   HGBA1C 5.4 06/12/2021     Radiology Reports CARDIAC  CATHETERIZATION  Addendum Date: 06/16/2021  Ost LAD to Mid LAD lesion is 85% stenosed.   Prox RCA lesion is 50% stenosed.   LPAV lesion is 50% stenosed.   There is mild to moderate left ventricular systolic dysfunction.   LV end diastolic pressure is normal.   LV end diastolic pressure is normal.   The left ventricular ejection fraction is 35-45% by visual estimate.   Hemodynamic findings consistent with mild pulmonary hypertension. CONCLUSIONS: Calcified segmental 85% proximal LAD Dominant circumflex without significant obstruction.  Distally before the PDA there is segmental 50% narrowing. Left main is widely patent Right coronary was poorly visualized, is nondominant, and contains mid 50% stenosis. Low normal to mild systolic dysfunction with EF in the 35 to 45% range.  LVEDP 14 mmHg. Mild pulmonary hypertension with mean PA pressure 22 mmHg; mean wedge pressure 14 mmHg; right atrial mean 6 mmHg.  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 RECOMMENDATION: Consider starting dual antiplatelet therapy if felt safe.  I will recommend Plavix. Continue to treat heart failure and recuperate from pneumonia. Active orbital atherectomy and stenting of the LAD and days 2 weeks once the patient is stronger. Earlier management if the patient has ischemic symptoms. LV function is improving and is clearly better than 25 to 30% on this occasion as noted above. Hemodynamics are reasonable but would start basic guideline directed therapy for heart failure.   Result Date: 06/16/2021   Ost LAD to Mid LAD lesion is 85% stenosed.   Prox RCA lesion is 50% stenosed.   LPAV lesion is 50% stenosed.   There is mild to moderate left ventricular systolic dysfunction.   LV end diastolic pressure is normal.   LV end diastolic pressure is normal.   The left ventricular ejection fraction is 35-45% by visual estimate.   Hemodynamic findings consistent with mild pulmonary hypertension. CONCLUSIONS: Calcified segmental 85% proximal LAD Dominant  circumflex without significant obstruction.  Distally before the PDA there is segmental 50% narrowing. Left main is widely patent Right coronary was poorly visualized, is nondominant, and contains mid 50% stenosis. Low normal to mild systolic dysfunction with EF in the 35 to 45% range.  LVEDP 14 mmHg. Mild pulmonary hypertension with mean PA pressure 22 mmHg; mean wedge pressure 14 mmHg; right atrial mean 6 mmHg.  Pulmonary hypertension group 2. RECOMMENDATION: Consider starting dual antiplatelet therapy if felt safe.  I will recommend Plavix. Continue to treat heart failure and recuperate from pneumonia. Active orbital atherectomy and stenting of the LAD and days 2 weeks once the patient is stronger. Earlier management if the patient has ischemic symptoms. LV function is improving and is clearly better than 25 to 30% on this occasion as noted above. Hemodynamics are reasonable but would start basic guideline directed therapy for heart failure.  DG Chest Port 1 View  Result Date: 06/17/2021 CLINICAL DATA:  Shortness of breath. EXAM: PORTABLE CHEST 1 VIEW COMPARISON:  06/12/2021 FINDINGS: Heart size is normal. Bilateral interstitial and airspace opacities are again noted and are unchanged compared with the previous exam. Lung volumes remain low. No pleural effusion identified. IMPRESSION: No change in aeration to the lungs compared with previous exam. Electronically Signed   By: Kerby Moors M.D.   On: 06/17/2021 07:11

## 2021-06-18 ENCOUNTER — Inpatient Hospital Stay (HOSPITAL_COMMUNITY): Payer: Medicare Other

## 2021-06-18 DIAGNOSIS — I214 Non-ST elevation (NSTEMI) myocardial infarction: Secondary | ICD-10-CM | POA: Diagnosis not present

## 2021-06-18 DIAGNOSIS — I5023 Acute on chronic systolic (congestive) heart failure: Secondary | ICD-10-CM | POA: Diagnosis not present

## 2021-06-18 LAB — COMPREHENSIVE METABOLIC PANEL
ALT: 42 U/L (ref 0–44)
AST: 49 U/L — ABNORMAL HIGH (ref 15–41)
Albumin: 2.4 g/dL — ABNORMAL LOW (ref 3.5–5.0)
Alkaline Phosphatase: 64 U/L (ref 38–126)
Anion gap: 10 (ref 5–15)
BUN: 11 mg/dL (ref 8–23)
CO2: 26 mmol/L (ref 22–32)
Calcium: 8.5 mg/dL — ABNORMAL LOW (ref 8.9–10.3)
Chloride: 102 mmol/L (ref 98–111)
Creatinine, Ser: 0.82 mg/dL (ref 0.44–1.00)
GFR, Estimated: >60 mL/min (ref >60)
Glucose, Bld: 114 mg/dL — ABNORMAL HIGH (ref 70–99)
Potassium: 3.4 mmol/L — ABNORMAL LOW (ref 3.5–5.1)
Sodium: 138 mmol/L (ref 135–145)
Total Bilirubin: 0.3 mg/dL (ref 0.3–1.2)
Total Protein: 6.4 g/dL — ABNORMAL LOW (ref 6.5–8.1)

## 2021-06-18 LAB — CBC WITH DIFFERENTIAL/PLATELET
Abs Immature Granulocytes: 0.17 10*3/uL — ABNORMAL HIGH (ref 0.00–0.07)
Basophils Absolute: 0.1 10*3/uL (ref 0.0–0.1)
Basophils Relative: 1 %
Eosinophils Absolute: 0.8 10*3/uL — ABNORMAL HIGH (ref 0.0–0.5)
Eosinophils Relative: 7 %
HCT: 31.1 % — ABNORMAL LOW (ref 36.0–46.0)
Hemoglobin: 10 g/dL — ABNORMAL LOW (ref 12.0–15.0)
Immature Granulocytes: 2 %
Lymphocytes Relative: 35 %
Lymphs Abs: 4.1 10*3/uL — ABNORMAL HIGH (ref 0.7–4.0)
MCH: 30.9 pg (ref 26.0–34.0)
MCHC: 32.2 g/dL (ref 30.0–36.0)
MCV: 96 fL (ref 80.0–100.0)
Monocytes Absolute: 1.2 10*3/uL — ABNORMAL HIGH (ref 0.1–1.0)
Monocytes Relative: 10 %
Neutro Abs: 5.4 10*3/uL (ref 1.7–7.7)
Neutrophils Relative %: 45 %
Platelets: 388 10*3/uL (ref 150–400)
RBC: 3.24 MIL/uL — ABNORMAL LOW (ref 3.87–5.11)
RDW: 13.8 % (ref 11.5–15.5)
WBC: 11.6 10*3/uL — ABNORMAL HIGH (ref 4.0–10.5)
nRBC: 0 % (ref 0.0–0.2)

## 2021-06-18 LAB — BRAIN NATRIURETIC PEPTIDE: B Natriuretic Peptide: 437 pg/mL — ABNORMAL HIGH (ref 0.0–100.0)

## 2021-06-18 LAB — MAGNESIUM: Magnesium: 2 mg/dL (ref 1.7–2.4)

## 2021-06-18 LAB — GLUCOSE, CAPILLARY
Glucose-Capillary: 120 mg/dL — ABNORMAL HIGH (ref 70–99)
Glucose-Capillary: 160 mg/dL — ABNORMAL HIGH (ref 70–99)
Glucose-Capillary: 98 mg/dL (ref 70–99)
Glucose-Capillary: 96 mg/dL (ref 70–99)

## 2021-06-18 LAB — PROCALCITONIN: Procalcitonin: <0.1 ng/mL

## 2021-06-18 LAB — C-REACTIVE PROTEIN: CRP: 2.1 mg/dL — ABNORMAL HIGH (ref <1.0)

## 2021-06-18 MED ORDER — POTASSIUM CHLORIDE CRYS ER 20 MEQ PO TBCR
40.0000 meq | EXTENDED_RELEASE_TABLET | Freq: Once | ORAL | Status: AC
  Administered 2021-06-18: 40 meq via ORAL
  Filled 2021-06-18: qty 2

## 2021-06-18 MED ORDER — CARVEDILOL 3.125 MG PO TABS
3.1250 mg | ORAL_TABLET | Freq: Two times a day (BID) | ORAL | Status: DC
Start: 2021-06-18 — End: 2021-06-22
  Administered 2021-06-18 – 2021-06-19 (×3): 3.125 mg via ORAL
  Filled 2021-06-18 (×3): qty 1

## 2021-06-18 MED ORDER — BOOST / RESOURCE BREEZE PO LIQD CUSTOM
1.0000 | Freq: Two times a day (BID) | ORAL | Status: DC
  Administered 2021-06-18 – 2021-06-22 (×5): 1 via ORAL

## 2021-06-18 MED ORDER — FUROSEMIDE 10 MG/ML IJ SOLN
40.0000 mg | Freq: Once | INTRAMUSCULAR | Status: AC
  Administered 2021-06-18: 40 mg via INTRAVENOUS
  Filled 2021-06-18: qty 4

## 2021-06-18 MED ORDER — IOHEXOL 350 MG/ML SOLN
75.0000 mL | Freq: Once | INTRAVENOUS | Status: AC | PRN
  Administered 2021-06-18: 75 mL via INTRAVENOUS

## 2021-06-18 NOTE — Progress Notes (Signed)
Cardiology Progress Note  Patient ID: Veronica Romero MRN: [id number] DOB: April 11, 1951 Date of Encounter: 06/18/2021  Primary Cardiologist: Berniece Salines, DO  Subjective   Chief Complaint: Shortness of breath  HPI: Still requiring oxygen 3 L.  Good urine output.  I do not believe this is pulmonary edema.  I believe this is pneumonia.  Repeat chest x-ray pending.  Tolerating heart failure medications.  ROS:  All other ROS reviewed and negative. Pertinent positives noted in the HPI.     Inpatient Medications  Scheduled Meds:  aspirin EC  81 mg Oral Daily   clopidogrel  75 mg Oral Daily   dapagliflozin propanediol  10 mg Oral Daily   digoxin  0.125 mg Oral Daily   enoxaparin (LOVENOX) injection  40 mg Subcutaneous QHS   feeding supplement  1 Container Oral BID BM   folic acid  1 mg Oral Daily   insulin aspart  0-9 Units Subcutaneous TID WC   losartan  12.5 mg Oral Daily   mouth rinse  15 mL Mouth Rinse BID   multivitamin with minerals  1 tablet Oral Daily   potassium chloride  40 mEq Oral Once   predniSONE  5 mg Oral Q breakfast   rosuvastatin  10 mg Oral Daily   spironolactone  12.5 mg Oral Daily   thiamine  100 mg Oral Daily   Continuous Infusions:  sodium chloride     piperacillin-tazobactam (ZOSYN)  IV 3.375 g (06/18/21 0612)   PRN Meds: acetaminophen **OR** acetaminophen, acetaminophen, benzonatate, docusate, levalbuterol, menthol-cetylpyridinium, ondansetron (ZOFRAN) IV, polyethylene glycol, polyvinyl alcohol, sodium chloride, sodium chloride flush   Vital Signs   Vitals:   06/18/21 0033 06/18/21 0450 06/18/21 0500 06/18/21 0731  BP: (!) 114/54 (!) 106/56  (!) 108/51  Pulse: 85 76 86   Resp: 18 19 14    Temp: 98.3 F (36.8 C) (!) 97.4 F (36.3 C)  98 F (36.7 C)  TempSrc: Oral Oral  Oral  SpO2: 95% 93% (!) 89%   Weight:   65.6 kg   Height:        Intake/Output Summary (Last 24 hours) at 06/18/2021 0813 Last data filed at 06/18/2021 [id number] Gross per 24 hour   Intake 723 ml  Output 1900 ml  Net -1177 ml   Last 3 Weights 06/18/2021 06/17/2021 06/16/2021  Weight (lbs) 144 lb 9.6 oz 156 lb 12 oz 162 lb 0.6 oz  Weight (kg) 65.59 kg 71.1 kg 73.5 kg      Telemetry  Overnight telemetry shows sinus rhythm in the 90s., which I personally reviewed.   Physical Exam   Vitals:   06/18/21 0033 06/18/21 0450 06/18/21 0500 06/18/21 0731  BP: (!) 114/54 (!) 106/56  (!) 108/51  Pulse: 85 76 86   Resp: 18 19 14    Temp: 98.3 F (36.8 C) (!) 97.4 F (36.3 C)  98 F (36.7 C)  TempSrc: Oral Oral  Oral  SpO2: 95% 93% (!) 89%   Weight:   65.6 kg   Height:        Intake/Output Summary (Last 24 hours) at 06/18/2021 0813 Last data filed at 06/18/2021 [id number] Gross per 24 hour  Intake 723 ml  Output 1900 ml  Net -1177 ml    Last 3 Weights 06/18/2021 06/17/2021 06/16/2021  Weight (lbs) 144 lb 9.6 oz 156 lb 12 oz 162 lb 0.6 oz  Weight (kg) 65.59 kg 71.1 kg 73.5 kg    Body mass index is 23.34 kg/m.  General: Well  nourished, well developed, in no acute distress Head: Atraumatic, normal size  Eyes: PEERLA, EOMI  Neck: Supple, no JVD Endocrine: No thryomegaly Cardiac: Normal S1, S2; RRR; no murmurs, rubs, or gallops Lungs: Diffuse rales and rhonchi bilaterally Abd: Soft, nontender, no hepatomegaly  Ext: No edema, pulses 2+ Musculoskeletal: No deformities, BUE and BLE strength normal and equal Skin: Warm and dry, no rashes   Neuro: Alert and oriented to person, place, time, and situation, CNII-XII grossly intact, no focal deficits  Psych: Normal mood and affect   Labs  High Sensitivity Troponin:   Recent Labs  Lab 06/11/21 1645 06/11/21 2229 06/12/21 1115 06/12/21 1627 06/13/21 0553  TROPONINIHS 25* 19* 768* 3,044* 1,806*     Cardiac EnzymesNo results for input(s): TROPONINI in the last 168 hours. No results for input(s): TROPIPOC in the last 168 hours.  Chemistry Recent Labs  Lab 06/12/21 1115 06/12/21 1410 06/16/21 0208 06/16/21 1001  06/16/21 1010 06/17/21 0132 06/18/21 0059  NA 139   < > 140   < > 141 138 138  K 3.0*   < > 4.1   < > 3.3* 3.3* 3.4*  CL 104   < > 102  --   --  103 102  CO2 24   < > 30  --   --  26 26  GLUCOSE 172*   < > 122*  --   --  120* 114*  BUN 8   < > 17  --   --  14 11  CREATININE 0.76   < > 0.63  --   --  0.69 0.82  CALCIUM 7.9*   < > 8.3*  --   --  8.4* 8.5*  PROT 6.7  --   --   --   --  6.2* 6.4*  ALBUMIN 2.8*  --   --   --   --  2.3* 2.4*  AST 116*  --   --   --   --  55* 49*  ALT 35  --   --   --   --  40 42  ALKPHOS 117  --   --   --   --  70 64  BILITOT 1.3*  --   --   --   --  0.5 0.3  GFRNONAA >60   < > >60  --   --  >60 >60  ANIONGAP 11   < > 8  --   --  9 10   < > = values in this interval not displayed.    Hematology Recent Labs  Lab 06/16/21 0208 06/16/21 1001 06/16/21 1010 06/17/21 0132 06/18/21 0059  WBC 11.8*  --   --  11.1* 11.6*  RBC 3.10*  --   --  2.98* 3.24*  HGB 9.5*   < > 9.2* 9.3* 10.0*  HCT 29.3*   < > 27.0* 27.8* 31.1*  MCV 94.5  --   --  93.3 96.0  MCH 30.6  --   --  31.2 30.9  MCHC 32.4  --   --  33.5 32.2  RDW 13.7  --   --  13.7 13.8  PLT 343  --   --  364 388   < > = values in this interval not displayed.   BNP Recent Labs  Lab 06/14/21 1719 06/17/21 0132 06/18/21 0059  BNP 541.6* 697.8* 437.0*    DDimer No results for input(s): DDIMER in the last 168 hours.   Radiology  CARDIAC  CATHETERIZATION  Addendum Date: 06/17/2021     Ost LAD to Mid LAD lesion is 85% stenosed.   Prox RCA lesion is 50% stenosed.   LPAV lesion is 50% stenosed.   There is mild to moderate left ventricular systolic dysfunction.   LV end diastolic pressure is normal.   LV end diastolic pressure is normal.   The left ventricular ejection fraction is 35-45% by visual estimate.   Hemodynamic findings consistent with mild pulmonary hypertension. CONCLUSIONS: Calcified segmental 85% proximal LAD Dominant circumflex without significant obstruction.  Distally before the PDA  there is segmental 50% narrowing. Left main is widely patent Right coronary was poorly visualized, is nondominant, and contains mid 50% stenosis. Low normal to mild systolic dysfunction with EF in the 35 to 45% range.  LVEDP 14 mmHg. Mild pulmonary hypertension with mean PA pressure 22 mmHg; mean wedge pressure 14 mmHg; right atrial mean 6 mmHg.  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 RECOMMENDATION: Consider starting dual antiplatelet therapy if felt safe.  Recommend Plavix 75 mg/day. Continue to treat heart failure and recuperation from pneumonia. Orbital atherectomy and stenting of the LAD in days to weeks once the patient is stronger. Earlier percutaneous management if the patient has ischemic symptoms. LV function is improving and is clearly better than 25 to 30% as noted above. Hemodynamics are reasonable and would start basic guideline directed therapy for heart failure.  Addendum Date: 06/16/2021     Ost LAD to Mid LAD lesion is 85% stenosed.   Prox RCA lesion is 50% stenosed.   LPAV lesion is 50% stenosed.   There is mild to moderate left ventricular systolic dysfunction.   LV end diastolic pressure is normal.   LV end diastolic pressure is normal.   The left ventricular ejection fraction is 35-45% by visual estimate.   Hemodynamic findings consistent with mild pulmonary hypertension. CONCLUSIONS: Calcified segmental 85% proximal LAD Dominant circumflex without significant obstruction.  Distally before the PDA there is segmental 50% narrowing. Left main is widely patent Right coronary was poorly visualized, is nondominant, and contains mid 50% stenosis. Low normal to mild systolic dysfunction with EF in the 35 to 45% range.  LVEDP 14 mmHg. Mild pulmonary hypertension with mean PA pressure 22 mmHg; mean wedge pressure 14 mmHg; right atrial mean 6 mmHg.  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 RECOMMENDATION: Consider starting dual antiplatelet therapy if felt safe.  I will recommend Plavix. Continue to treat  heart failure and recuperate from pneumonia. Active orbital atherectomy and stenting of the LAD and days 2 weeks once the patient is stronger. Earlier management if the patient has ischemic symptoms. LV function is improving and is clearly better than 25 to 30% on this occasion as noted above. Hemodynamics are reasonable but would start basic guideline directed therapy for heart failure.   Result Date: 06/17/2021   Ost LAD to Mid LAD lesion is 85% stenosed.   Prox RCA lesion is 50% stenosed.   LPAV lesion is 50% stenosed.   There is mild to moderate left ventricular systolic dysfunction.   LV end diastolic pressure is normal.   LV end diastolic pressure is normal.   The left ventricular ejection fraction is 35-45% by visual estimate.   Hemodynamic findings consistent with mild pulmonary hypertension. CONCLUSIONS: Calcified segmental 85% proximal LAD Dominant circumflex without significant obstruction.  Distally before the PDA there is segmental 50% narrowing. Left main is widely patent Right coronary was poorly visualized, is nondominant, and contains mid 50% stenosis. Low normal to  mild systolic dysfunction with EF in the 35 to 45% range.  LVEDP 14 mmHg. Mild pulmonary hypertension with mean PA pressure 22 mmHg; mean wedge pressure 14 mmHg; right atrial mean 6 mmHg.  Pulmonary hypertension group 2. RECOMMENDATION: Consider starting dual antiplatelet therapy if felt safe.  I will recommend Plavix. Continue to treat heart failure and recuperate from pneumonia. Active orbital atherectomy and stenting of the LAD and days 2 weeks once the patient is stronger. Earlier management if the patient has ischemic symptoms. LV function is improving and is clearly better than 25 to 30% on this occasion as noted above. Hemodynamics are reasonable but would start basic guideline directed therapy for heart failure.  DG Chest Port 1 View  Result Date: 06/17/2021 CLINICAL DATA:  Shortness of breath. EXAM: PORTABLE CHEST 1 VIEW  COMPARISON:  06/12/2021 FINDINGS: Heart size is normal. Bilateral interstitial and airspace opacities are again noted and are unchanged compared with the previous exam. Lung volumes remain low. No pleural effusion identified. IMPRESSION: No change in aeration to the lungs compared with previous exam. Electronically Signed   By: Kerby Moors M.D.   On: 06/17/2021 07:11    Cardiac Studies  TTE 06/12/2021  1. Left ventricular ejection fraction, by estimation, is 25 to 30%. The  left ventricle has severely decreased function. The left ventricle  demonstrates regional wall motion abnormalities (see scoring  diagram/findings for description). Left ventricular  diastolic parameters are consistent with Grade I diastolic dysfunction  (impaired relaxation).   2. Right ventricular systolic function is mildly reduced. The right  ventricular size is normal. There is mildly elevated pulmonary artery  systolic pressure. The estimated right ventricular systolic pressure is  0000000 mmHg.   3. The mitral valve is normal in structure. Trivial mitral valve  regurgitation. No evidence of mitral stenosis.   4. The aortic valve is tricuspid. Aortic valve regurgitation is mild.  Aortic valve sclerosis/calcification is present, without any evidence of  aortic stenosis.   5. The inferior vena cava is dilated in size with <50% respiratory  variability, suggesting right atrial pressure of 15 mmHg.   LHC/RHC 06/16/2021   Ost LAD to Mid LAD lesion is 85% stenosed.   Prox RCA lesion is 50% stenosed.   LPAV lesion is 50% stenosed.   There is mild to moderate left ventricular systolic dysfunction.   LV end diastolic pressure is normal.   LV end diastolic pressure is normal.   The left ventricular ejection fraction is 35-45% by visual estimate.   Hemodynamic findings consistent with mild pulmonary hypertension.   CONCLUSIONS: Calcified segmental 85% proximal LAD Dominant circumflex without significant obstruction.   Distally before the PDA there is segmental 50% narrowing. Left main is widely patent Right coronary was poorly visualized, is nondominant, and contains mid 50% stenosis. Low normal to mild systolic dysfunction with EF in the 35 to 45% range.  LVEDP 14 mmHg. Mild pulmonary hypertension with mean PA pressure 22 mmHg; mean wedge pressure 14 mmHg; right atrial mean 6 mmHg.  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  Patient Profile  Veronica Romero is a 71 y.o. female with rheumatoid arthritis who was admitted on 06/14/2021 with acute hypoxic respiratory failure secondary to pneumonia.  Found to have new onset systolic heart failure.  Left heart catheterization with severe LAD stenosis.  Assessment & Plan   #Non-STEMI -Admitted with pneumonia.  Found to have secondary myocardial infarction.  Left heart catheterization with severe proximal LAD stenosis.  Plan for medical management with  staged intervention when she is recovered from pneumonia. -Denies any symptoms of chest pain. -Continue aspirin 81 mg daily and Plavix 75 mg daily. -On statin -EF 25-30%.  Treating as below.  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 -Appears euvolemic to me.  Still has crackles and rhonchi but I believe this is pneumonia related. -Given 40 mg of IV Lasix yesterday.  Hospital medicine is ordered this again today.  She is net -3.8 L since admission.  Weight is down from 75 kg to 65 kg.   -Right heart cath with wedge of 14 mmHg.  Minimally to mild elevated pulmonary pressures.  Does not appear volume overloaded to me.  Would recommend a repeat chest x-ray.  Suspect diffuse infiltrates or pneumonia versus edema. -Add Coreg 3.125 mg twice daily.  Digoxin 0.125 mg daily was added yesterday to assist with blood pressure and tolerating GDMT.  We will continue this.  Kidney function is stable.  Losartan 12.5 mg daily was added.  Transition to [facility] as you  are able.  Given soft BP do not suspect she will tolerate this.  Continue Aldactone 12.5 mg daily. -Suspect she can be transition to oral diuretics tomorrow.  Follow-up chest x-ray and response to IV diuresis today.  Again a lot of this appears to be pneumonia.  #Hypoxic respiratory failure secondary to pneumonia -Exam with diffuse rhonchi and crackles.  More consistent with pneumonia.  Repeat chest x-ray pending.  See above discussion.  For questions or updates, please contact [place] Please consult [email or web address] for contact info under     Signed, [place] T. Audie Box, MD, [place]  06/18/2021 8:13 AM

## 2021-06-18 NOTE — Progress Notes (Signed)
PROGRESS NOTE                                                                                                                                                                                                             Patient Demographics:    Veronica Romero, is a 71 y.o. female, DOB - Jan 15, 1951, [id number]  Outpatient Primary MD for the patient is Pcp, No    LOS - 7  Admit date - 06/11/2021    Chief Complaint  Patient presents with   Cough   Chest Pain   Shortness of Breath       Brief Narrative (HPI from H&P)   71 yo F PMH RA on methotrexate, prednisone, humira, recent COVID-19 infection with minimal sx presented to [facility] 06/11/21 with SOB and cough.  She rapidly developed worsening of her respiratory status and she was intubated and placed in ICU, she underwent diagnostic bronchoscopy, was given IV antibiotics was also seen by cardiology team for elevated troponin and possible heart failure.  She was extubated on 06/14/2021 and transferred to hospitalist service on 06/16/2021.   Subjective:   Patient in bed, appears comfortable, denies any headache, no fever, no chest pain or pressure, improved shortness of breath , no abdominal pain. No new focal weakness.    Assessment  & Plan :    Acute Hypoxic Resp. Failure due to CAP in a patient who is immunocompromised due to combination of Humira, prednisone, methotrexate for her underlying RA - she was initially intubated for 3 days and finally extubated on 06/14/2021, underwent diagnostic bronchoscopy, she was on combination of azithromycin and Zosyn and has finished the azithromycin already and will finish Zosyn morning of 06/19/2021 per ICU team.  Currently on nasal cannula oxygen 3 to 4 L along with supportive care with nebulizer treatments.  Steroids have been tapered off note she takes 5 mg of prednisone at baseline for her RA.  Encouraged the patient to sit up in chair in  the daytime use I-S and flutter valve for pulmonary toiletry.  Will advance activity and titrate down oxygen as possible.  2.  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  Left heart cath done on 06/16/2021 shows significant proximal LAD calcification of close to 80%, case discussed with cardiology group on 06/16/2021 and 06/17/2021,  placed on dual antiplatelet therapy along with statin which was started on 06/16/2021, continue diuretics, currently chest pain-free likely for stent placement at a later date due to her recent pneumonia.  Continue diuresis with Lasix as tolerated, ++ coarse Rales. CT chest today, CRP & Procal to differentiate PNA VS CHF.   3.  RA.  Now on her chronic 5 mg of prednisone at baseline.  Methotrexate and Humira on hold.  4.  Alcohol use.  Counseled to cut down, on vitamins, no DTs will monitor.  5.  Normocytic anemia.  Monitor.  6.  Dyslipidemia.  Placed on statin.      Condition - Extremely Guarded  Family Communication  : Daughter bedside on 06/16/2021, 06/17/21, 06/18/21  Code Status : Full  Consults  : PCCM, cardiology  PUD Prophylaxis : Pepcid   Procedures  :     Intubated on 06/11/2021 extubated 06/14/2021.  Echocardiogram.  1. Left ventricular ejection fraction, by estimation, is 25 to 30%. The left ventricle has severely decreased function. The left ventricle demonstrates regional wall motion abnormalities (see scoring diagram/findings for description). Left ventricular diastolic parameters are consistent with Grade I diastolic dysfunction (impaired relaxation).  2. Right ventricular systolic function is mildly reduced. The right ventricular size is normal. There is mildly elevated pulmonary artery systolic pressure. The estimated right ventricular systolic pressure is 0000000 mmHg.  3. The mitral valve is normal in structure. Trivial mitral valve regurgitation. No evidence of mitral stenosis.  4. The aortic valve is  tricuspid. Aortic valve regurgitation is mild. Aortic valve sclerosis/calcification is present, without any evidence of aortic stenosis.  5. The inferior vena cava is dilated in size with <50% respiratory variability, suggesting right atrial pressure of 15 mmHg.  Left heart cath - Dr Tamala Julian 06/16/2021  CONCLUSIONS: Calcified segmental 85% proximal LAD Dominant circumflex without significant obstruction.  Distally before the PDA there is segmental 50% narrowing. Left main is widely patent Right coronary was poorly visualized, is nondominant, and contains mid 50% stenosis. Low normal to mild systolic dysfunction with EF in the 35 to 45% range.  LVEDP 14 mmHg. Mild pulmonary hypertension with mean PA pressure 22 mmHg; mean wedge pressure 14 mmHg; right atrial mean 6 mmHg.  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   RECOMMENDATION: Consider starting dual antiplatelet therapy if felt safe.  I will recommend Plavix. Continue to treat heart failure and recuperate from pneumonia. Active orbital atherectomy and stenting of the LAD and days 2 weeks once the patient is stronger. Earlier management if the patient has ischemic symptoms. LV function is improving and is clearly better than 25 to 30% on this occasion as noted above. Hemodynamics are reasonable but would start basic guideline directed therapy for heart failure.      Disposition Plan  :    Status is: Inpatient  DVT Prophylaxis  :    enoxaparin (LOVENOX) injection 40 mg Start: 06/16/21 2200 SCDs Start: 06/12/21 [id number]    Lab Results  Component Value Date   PLT 388 06/18/2021    Diet :  Diet Order             Diet Heart Room service appropriate? Yes; Fluid consistency: Thin  Diet effective now                    Inpatient Medications  Scheduled Meds:  aspirin EC  81 mg Oral Daily   carvedilol  3.125 mg Oral BID WC   clopidogrel  75 mg Oral Daily  dapagliflozin propanediol  10 mg Oral Daily   digoxin  0.125 mg Oral Daily    enoxaparin (LOVENOX) injection  40 mg Subcutaneous QHS   feeding supplement  1 Container Oral BID BM   folic acid  1 mg Oral Daily   insulin aspart  0-9 Units Subcutaneous TID WC   losartan  12.5 mg Oral Daily   mouth rinse  15 mL Mouth Rinse BID   multivitamin with minerals  1 tablet Oral Daily   potassium chloride  40 mEq Oral Once   predniSONE  5 mg Oral Q breakfast   rosuvastatin  10 mg Oral Daily   spironolactone  12.5 mg Oral Daily   thiamine  100 mg Oral Daily   Continuous Infusions:  sodium chloride     piperacillin-tazobactam (ZOSYN)  IV 3.375 g (06/18/21 0612)   PRN Meds:.acetaminophen **OR** acetaminophen, acetaminophen, benzonatate, docusate, levalbuterol, menthol-cetylpyridinium, ondansetron (ZOFRAN) IV, polyethylene glycol, polyvinyl alcohol, sodium chloride, sodium chloride flush  Antibiotics  :    Anti-infectives (From admission, onward)    Start     Dose/Rate Route Frequency Ordered Stop   06/12/21 1000  piperacillin-tazobactam (ZOSYN) IVPB 3.375 g        3.375 g 12.5 mL/hr over 240 Minutes Intravenous Every 8 hours 06/12/21 0947 06/18/21 2359   06/11/21 1800  cefTRIAXone (ROCEPHIN) 2 g in sodium chloride 0.9 % 100 mL IVPB  Status:  Discontinued        2 g 200 mL/hr over 30 Minutes Intravenous Every 24 hours 06/11/21 1749 06/12/21 0911   06/11/21 1800  azithromycin (ZITHROMAX) 500 mg in sodium chloride 0.9 % 250 mL IVPB        500 mg 250 mL/hr over 60 Minutes Intravenous Every 24 hours 06/11/21 1749 06/15/21 1951        Time Spent in minutes  30   Lala Lund M.D on 06/18/2021 at 10:02 AM  To page go to [email or web address]   [facility] -  Office  [phone]  See all Orders from today for further details    Objective:   Vitals:   06/18/21 0033 06/18/21 0450 06/18/21 0500 06/18/21 0731  BP: (!) 114/54 (!) 106/56  (!) 108/51  Pulse: 85 76 86   Resp: 18 19 14    Temp: 98.3 F (36.8 C) (!) 97.4 F (36.3 C)  98 F (36.7 C)  TempSrc: Oral  Oral  Oral  SpO2: 95% 93% (!) 89%   Weight:   65.6 kg   Height:        Wt Readings from Last 3 Encounters:  06/18/21 65.6 kg     Intake/Output Summary (Last 24 hours) at 06/18/2021 1002 Last data filed at 06/18/2021 0800 Gross per 24 hour  Intake 960 ml  Output 1900 ml  Net -940 ml     Physical Exam  Awake Alert, No new F.N deficits, Normal affect [place].AT,PERRAL Supple Neck, No JVD,   Symmetrical Chest wall movement, Good air movement bilaterally, ++ coarse rales RRR,No Gallops, Rubs or new Murmurs,  +ve B.Sounds, Abd Soft, No tenderness,   No Cyanosis, Clubbing or edema     Data Review:    CBC Recent Labs  Lab 06/14/21 0703 06/15/21 0309 06/16/21 0208 06/16/21 1001 06/16/21 1004 06/16/21 1010 06/17/21 0132 06/18/21 0059  WBC 13.9* 11.8* 11.8*  --   --   --  11.1* 11.6*  HGB 9.7* 9.3* 9.5* 9.5* 9.2* 9.2* 9.3* 10.0*  HCT 28.6* 27.2* 29.3* 28.0* 27.0* 27.0* 27.8* 31.1*  PLT 352 357 343  --   --   --  364 388  MCV 92.9 91.9 94.5  --   --   --  93.3 96.0  MCH 31.5 31.4 30.6  --   --   --  31.2 30.9  MCHC 33.9 34.2 32.4  --   --   --  33.5 32.2  RDW 13.8 13.7 13.7  --   --   --  13.7 13.8  LYMPHSABS  --   --   --   --   --   --  3.8 4.1*  MONOABS  --   --   --   --   --   --  1.1* 1.2*  EOSABS  --   --   --   --   --   --  0.4 0.8*  BASOSABS  --   --   --   --   --   --  0.1 0.1    Electrolytes Recent Labs  Lab 06/11/21 1800 06/12/21 0145 06/12/21 0442 06/12/21 1016 06/12/21 1115 06/12/21 1118 06/12/21 1410 06/13/21 0044 06/13/21 0422 06/14/21 0703 06/14/21 1719 06/15/21 0309 06/16/21 0208 06/16/21 1001 06/16/21 1004 06/16/21 1010 06/17/21 0132 06/18/21 0059  NA  --   --   --    < > 139  --    < > 138   < > 141  --  143 140 142 142 141 138 138  K  --   --   --    < > 3.0*  --    < > 3.8   < > 3.3*  --  3.0* 4.1 3.4* 3.2* 3.3* 3.3* 3.4*  CL  --   --   --   --  104  --    < > 108  --  106  --  102 102  --   --   --  103 102  CO2  --   --   --    --  24  --    < > 21*  --  25  --  31 30  --   --   --  26 26  GLUCOSE  --   --   --   --  172*  --    < > 192*  --  164*  --  132* 122*  --   --   --  120* 114*  BUN  --   --   --   --  8  --    < > 13  --  18  --  14 17  --   --   --  14 11  CREATININE  --   --   --   --  0.76  --    < > 0.70  --  0.69  --  0.66 0.63  --   --   --  0.69 0.82  CALCIUM  --   --   --   --  7.9*  --    < > 8.2*  --  7.6*  --  7.8* 8.3*  --   --   --  8.4* 8.5*  AST  --   --   --   --  116*  --   --   --   --   --   --   --   --   --   --   --  55* 49*  ALT  --   --   --   --  35  --   --   --   --   --   --   --   --   --   --   --  40 42  ALKPHOS  --   --   --   --  117  --   --   --   --   --   --   --   --   --   --   --  70 64  BILITOT  --   --   --   --  1.3*  --   --   --   --   --   --   --   --   --   --   --  0.5 0.3  ALBUMIN  --   --   --   --  2.8*  --   --   --   --   --   --   --   --   --   --   --  2.3* 2.4*  MG  --   --   --    < > 1.8  --   --  2.7*  --   --   --  2.1 2.3  --   --   --  2.3 2.0  PROCALCITON  --  <0.10 <0.10  --  1.21  --   --  5.47  --   --   --   --   --   --   --   --   --   --   LATICACIDVEN 0.9  --   --   --  1.4  --   --   --   --   --   --   --   --   --   --   --   --   --   INR 1.1  --   --   --   --   --   --   --   --   --   --   --   --   --   --   --   --   --   HGBA1C  --   --   --   --   --  5.4  --   --   --   --   --   --   --   --   --   --   --   --   BNP  --   --   --   --  271.8*  --   --   --   --   --  541.6*  --   --   --   --   --  697.8* 437.0*   < > = values in this interval not displayed.    ------------------------------------------------------------------------------------------------------------------ Recent Labs    06/16/21 0208  CHOL 177  HDL 38*  LDLCALC 116*  TRIG 115  CHOLHDL 4.7    Lab Results  Component Value Date   HGBA1C 5.4 06/12/2021     Radiology Reports CARDIAC CATHETERIZATION  Addendum Date: 06/17/2021     Ost LAD to  Mid LAD lesion is 85% stenosed.   Prox RCA lesion is 50% stenosed.   LPAV lesion is 50% stenosed.   There is mild to moderate left ventricular systolic dysfunction.   LV end diastolic pressure is normal.  LV end diastolic pressure is normal.   The left ventricular ejection fraction is 35-45% by visual estimate.   Hemodynamic findings consistent with mild pulmonary hypertension. CONCLUSIONS: Calcified segmental 85% proximal LAD Dominant circumflex without significant obstruction.  Distally before the PDA there is segmental 50% narrowing. Left main is widely patent Right coronary was poorly visualized, is nondominant, and contains mid 50% stenosis. Low normal to mild systolic dysfunction with EF in the 35 to 45% range.  LVEDP 14 mmHg. Mild pulmonary hypertension with mean PA pressure 22 mmHg; mean wedge pressure 14 mmHg; right atrial mean 6 mmHg.  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 RECOMMENDATION: Consider starting dual antiplatelet therapy if felt safe.  Recommend Plavix 75 mg/day. Continue to treat heart failure and recuperation from pneumonia. Orbital atherectomy and stenting of the LAD in days to weeks once the patient is stronger. Earlier percutaneous management if the patient has ischemic symptoms. LV function is improving and is clearly better than 25 to 30% as noted above. Hemodynamics are reasonable and would start basic guideline directed therapy for heart failure.  Addendum Date: 06/16/2021     Ost LAD to Mid LAD lesion is 85% stenosed.   Prox RCA lesion is 50% stenosed.   LPAV lesion is 50% stenosed.   There is mild to moderate left ventricular systolic dysfunction.   LV end diastolic pressure is normal.   LV end diastolic pressure is normal.   The left ventricular ejection fraction is 35-45% by visual estimate.   Hemodynamic findings consistent with mild pulmonary hypertension. CONCLUSIONS: Calcified segmental 85% proximal LAD Dominant circumflex without significant obstruction.  Distally before the  PDA there is segmental 50% narrowing. Left main is widely patent Right coronary was poorly visualized, is nondominant, and contains mid 50% stenosis. Low normal to mild systolic dysfunction with EF in the 35 to 45% range.  LVEDP 14 mmHg. Mild pulmonary hypertension with mean PA pressure 22 mmHg; mean wedge pressure 14 mmHg; right atrial mean 6 mmHg.  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 RECOMMENDATION: Consider starting dual antiplatelet therapy if felt safe.  I will recommend Plavix. Continue to treat heart failure and recuperate from pneumonia. Active orbital atherectomy and stenting of the LAD and days 2 weeks once the patient is stronger. Earlier management if the patient has ischemic symptoms. LV function is improving and is clearly better than 25 to 30% on this occasion as noted above. Hemodynamics are reasonable but would start basic guideline directed therapy for heart failure.   Result Date: 06/17/2021   Ost LAD to Mid LAD lesion is 85% stenosed.   Prox RCA lesion is 50% stenosed.   LPAV lesion is 50% stenosed.   There is mild to moderate left ventricular systolic dysfunction.   LV end diastolic pressure is normal.   LV end diastolic pressure is normal.   The left ventricular ejection fraction is 35-45% by visual estimate.   Hemodynamic findings consistent with mild pulmonary hypertension. CONCLUSIONS: Calcified segmental 85% proximal LAD Dominant circumflex without significant obstruction.  Distally before the PDA there is segmental 50% narrowing. Left main is widely patent Right coronary was poorly visualized, is nondominant, and contains mid 50% stenosis. Low normal to mild systolic dysfunction with EF in the 35 to 45% range.  LVEDP 14 mmHg. Mild pulmonary hypertension with mean PA pressure 22 mmHg; mean wedge pressure 14 mmHg; right atrial mean 6 mmHg.  Pulmonary hypertension group 2. RECOMMENDATION: Consider starting dual antiplatelet therapy if felt safe.  I will recommend Plavix. Continue to  treat  heart failure and recuperate from pneumonia. Active orbital atherectomy and stenting of the LAD and days 2 weeks once the patient is stronger. Earlier management if the patient has ischemic symptoms. LV function is improving and is clearly better than 25 to 30% on this occasion as noted above. Hemodynamics are reasonable but would start basic guideline directed therapy for heart failure.  DG Chest Port 1 View  Result Date: 06/18/2021 CLINICAL DATA:  Cough, shortness of breath and chest pain. EXAM: PORTABLE CHEST 1 VIEW COMPARISON:  06/17/2021 FINDINGS: Stable cardiomediastinal contours. Lung volumes are low. Bilateral interstitial and airspace densities are again noted. These are not significantly changed when compared with the previous exam. No new findings. IMPRESSION: No change in bilateral interstitial and patchy airspace opacities. Electronically Signed   By: Kerby Moors M.D.   On: 06/18/2021 08:59   DG Chest Port 1 View  Result Date: 06/17/2021 CLINICAL DATA:  Shortness of breath. EXAM: PORTABLE CHEST 1 VIEW COMPARISON:  06/12/2021 FINDINGS: Heart size is normal. Bilateral interstitial and airspace opacities are again noted and are unchanged compared with the previous exam. Lung volumes remain low. No pleural effusion identified. IMPRESSION: No change in aeration to the lungs compared with previous exam. Electronically Signed   By: Kerby Moors M.D.   On: 06/17/2021 07:11

## 2021-06-18 NOTE — Progress Notes (Signed)
Mobility Specialist Progress Note   06/18/21 1633  Mobility  Activity Stood at bedside;Ambulated with assistance to bathroom  Level of Assistance Contact guard assist, steadying assist  Assistive Device Front wheel walker  Distance Ambulated (ft) 12 ft  Activity Response Tolerated well  $Mobility charge 1 Mobility   Pt needing to use BR prior to starting mobility, minA for initial stand (x2 bouts), then contact guard during ambulation. Successful void. Returned back to bedside to work on standing LE exercise x10ea. During exercises, x3 seated breaks d/t fatigue and slight SOB. Returned back to bed with call bell in reach and needs met.   Pre Mobility: 84 HR,91% SpO2 During Mobility: 116 HR, 87% SpO2 Post Mobility: 100 HR, 92% SpO2  Holland Falling Mobility Specialist Phone Number [phone]

## 2021-06-19 DIAGNOSIS — M059 Rheumatoid arthritis with rheumatoid factor, unspecified: Principal | ICD-10-CM

## 2021-06-19 DIAGNOSIS — M0579 Rheumatoid arthritis with rheumatoid factor of multiple sites without organ or systems involvement: Principal | ICD-10-CM

## 2021-06-19 DIAGNOSIS — M05742 Rheumatoid arthritis with rheumatoid factor of left hand without organ or systems involvement: Principal | ICD-10-CM

## 2021-06-19 DIAGNOSIS — M05741 Rheumatoid arthritis with rheumatoid factor of right hand without organ or systems involvement: Principal | ICD-10-CM

## 2021-06-19 DIAGNOSIS — M8589 Other specified disorders of bone density and structure, multiple sites: Principal | ICD-10-CM

## 2021-06-19 DIAGNOSIS — I214 Non-ST elevation (NSTEMI) myocardial infarction: Secondary | ICD-10-CM | POA: Diagnosis not present

## 2021-06-19 DIAGNOSIS — J9601 Acute respiratory failure with hypoxia: Secondary | ICD-10-CM | POA: Diagnosis not present

## 2021-06-19 DIAGNOSIS — I5021 Acute systolic (congestive) heart failure: Secondary | ICD-10-CM | POA: Diagnosis not present

## 2021-06-19 LAB — GLUCOSE, CAPILLARY
Glucose-Capillary: 93 mg/dL (ref 70–99)
Glucose-Capillary: 123 mg/dL — ABNORMAL HIGH (ref 70–99)
Glucose-Capillary: 104 mg/dL — ABNORMAL HIGH (ref 70–99)
Glucose-Capillary: 105 mg/dL — ABNORMAL HIGH (ref 70–99)

## 2021-06-19 LAB — CBC WITH DIFFERENTIAL/PLATELET
Abs Immature Granulocytes: 0.17 10*3/uL — ABNORMAL HIGH (ref 0.00–0.07)
Basophils Absolute: 0.1 10*3/uL (ref 0.0–0.1)
Basophils Relative: 1 %
Eosinophils Absolute: 1.1 10*3/uL — ABNORMAL HIGH (ref 0.0–0.5)
Eosinophils Relative: 9 %
HCT: 30.5 % — ABNORMAL LOW (ref 36.0–46.0)
Hemoglobin: 9.9 g/dL — ABNORMAL LOW (ref 12.0–15.0)
Immature Granulocytes: 2 %
Lymphocytes Relative: 31 %
Lymphs Abs: 3.5 10*3/uL (ref 0.7–4.0)
MCH: 30.7 pg (ref 26.0–34.0)
MCHC: 32.5 g/dL (ref 30.0–36.0)
MCV: 94.4 fL (ref 80.0–100.0)
Monocytes Absolute: 1.3 10*3/uL — ABNORMAL HIGH (ref 0.1–1.0)
Monocytes Relative: 11 %
Neutro Abs: 5.3 10*3/uL (ref 1.7–7.7)
Neutrophils Relative %: 46 %
Platelets: 407 10*3/uL — ABNORMAL HIGH (ref 150–400)
RBC: 3.23 MIL/uL — ABNORMAL LOW (ref 3.87–5.11)
RDW: 14.1 % (ref 11.5–15.5)
WBC: 11.4 10*3/uL — ABNORMAL HIGH (ref 4.0–10.5)
nRBC: 0 % (ref 0.0–0.2)

## 2021-06-19 LAB — COMPREHENSIVE METABOLIC PANEL
ALT: 41 U/L (ref 0–44)
AST: 37 U/L (ref 15–41)
Albumin: 2.5 g/dL — ABNORMAL LOW (ref 3.5–5.0)
Alkaline Phosphatase: 62 U/L (ref 38–126)
Anion gap: 9 (ref 5–15)
BUN: 11 mg/dL (ref 8–23)
CO2: 26 mmol/L (ref 22–32)
Calcium: 8.6 mg/dL — ABNORMAL LOW (ref 8.9–10.3)
Chloride: 101 mmol/L (ref 98–111)
Creatinine, Ser: 0.79 mg/dL (ref 0.44–1.00)
GFR, Estimated: >60 mL/min (ref >60)
Glucose, Bld: 98 mg/dL (ref 70–99)
Potassium: 4 mmol/L (ref 3.5–5.1)
Sodium: 136 mmol/L (ref 135–145)
Total Bilirubin: 0.3 mg/dL (ref 0.3–1.2)
Total Protein: 6.5 g/dL (ref 6.5–8.1)

## 2021-06-19 LAB — PROCALCITONIN: Procalcitonin: <0.1 ng/mL

## 2021-06-19 LAB — MAGNESIUM: Magnesium: 2.2 mg/dL (ref 1.7–2.4)

## 2021-06-19 LAB — BRAIN NATRIURETIC PEPTIDE: B Natriuretic Peptide: 207.6 pg/mL — ABNORMAL HIGH (ref 0.0–100.0)

## 2021-06-19 LAB — C-REACTIVE PROTEIN: CRP: 1.7 mg/dL — ABNORMAL HIGH (ref <1.0)

## 2021-06-19 MED ORDER — METHOTREXATE SODIUM 25 MG/ML INJECTION SOLUTION
SUBCUTANEOUS | 0 refills | 84 days | Status: CP
Start: 2021-06-19 — End: ?

## 2021-06-19 MED ORDER — FUROSEMIDE 40 MG PO TABS: 40.0000 mg | ORAL_TABLET | Freq: Every day | ORAL | Status: DC

## 2021-06-19 MED ORDER — FUROSEMIDE 10 MG/ML IJ SOLN
40.0000 mg | Freq: Once | INTRAMUSCULAR | Status: DC
  Filled 2021-06-19: qty 4

## 2021-06-19 NOTE — Progress Notes (Signed)
Physical Therapy Treatment Patient Details Name: Veronica Romero MRN: [id number] DOB: [birth year] Today's Date: 06/19/2021   History of Present Illness Pt is a 71 y/o female admitted due to multifocal PNA. Due to worsening respiration status, pt intubated 2/13. Extubated 2/15. ALso with new onset CHF. s/p LHC 06/16/21 with severe LAD stenosis. PMH: recent COVID-19 infection, RA.    PT Comments    Patient progressing well towards PT goals. Session focused on gait training, endurance training and activity tolerance. Pt tolerated standing at sink doing ADl session for a few mins but noted to fatigue with SOB needing rest break. Able to tolerate multiple bouts of gait training with frequent seated rest breaks due to SOB and fatigue. Pt on 2-4L min 02 [place] during session, Sp02 dropped to 79% with activity, recovers quickly with rest breaks and cues for pursed lip breathing. Education on short bouts of activity with longer rest breaks. Encouraged use of rollator at home for support and for energy conservation. Will likely need supplemental 02 for home. Will follow.    Recommendations for follow up therapy are one component of a multi-disciplinary discharge planning process, led by the attending physician.  Recommendations may be updated based on patient status, additional functional criteria and insurance authorization.  Follow Up Recommendations  Home health PT     Assistance Recommended at Discharge Intermittent Supervision/Assistance  Patient can return home with the following Assistance with cooking/housework;Assistance with feeding;Assist for transportation;Help with stairs or ramp for entrance;A little help with bathing/dressing/bathroom;A little help with walking and/or transfers   Equipment Recommendations  None recommended by PT (has a rollator)    Recommendations for Other Services       Precautions / Restrictions Precautions Precautions: Fall;Other (comment) Precaution Comments: watch  02 Restrictions Weight Bearing Restrictions: No     Mobility  Bed Mobility Overal bed mobility: Modified Independent Bed Mobility: Supine to Sit     Supine to sit: Modified independent (Device/Increase time), HOB elevated     General bed mobility comments: increased time, assist for lines    Transfers Overall transfer level: Needs assistance Equipment used: Rolling walker (2 wheels) Transfers: Sit to/from Stand Sit to Stand: Min guard           General transfer comment: Min guard for safety from EOB, MIn A from low toilet using rail. Stood from [employer], from chair x4, from toilet x1.    Ambulation/Gait Ambulation/Gait assistance: Min guard Gait Distance (Feet): 15 Feet (+ 15' + 18' + 15') Assistive device: Rolling walker (2 wheels), None Gait Pattern/deviations: Step-through pattern, Decreased stride length, Trunk flexed Gait velocity: decreased     General Gait Details: Slow, mildly unsteady gait with 2-3/4 DOE. Sp02 dropped to high 70s on 2L/min 02 , increased 02 to 4L with activity. Mutliple seated rest breaks needed, fatigues quickly.   Stairs             Wheelchair Mobility    Modified Rankin (Stroke Patients Only)       Balance Overall balance assessment: Needs assistance Sitting-balance support: No upper extremity supported, Feet supported Sitting balance-Leahy Scale: Fair     Standing balance support: During functional activity Standing balance-Leahy Scale: Fair Standing balance comment: Able to stand at sink and brush teeth, leaning on forearm after a few nins due to fatigue.                            Cognition Arousal/Alertness: Awake/alert Behavior  During Therapy: WFL for tasks assessed/performed Overall Cognitive Status: Within Functional Limits for tasks assessed                                 General Comments: very pleasant, participatory        Exercises      General Comments General comments  (skin integrity, edema, etc.): Pt on 2-4L min 02 [place] during session, Sp02 dropped to 79% with activity, recovers quickly with rest breaks and cues for pursed lip breathing.      Pertinent Vitals/Pain Pain Assessment Pain Assessment: No/denies pain    Home Living                          Prior Function            PT Goals (current goals can now be found in the care plan section) Progress towards PT goals: Progressing toward goals    Frequency    Min 3X/week      PT Plan Current plan remains appropriate    Co-evaluation              AM-PAC PT "6 Clicks" Mobility   Outcome Measure  Help needed turning from your back to your side while in a flat bed without using bedrails?: None Help needed moving from lying on your back to sitting on the side of a flat bed without using bedrails?: None Help needed moving to and from a bed to a chair (including a wheelchair)?: A Little Help needed standing up from a chair using your arms (e.g., wheelchair or bedside chair)?: A Little Help needed to walk in hospital room?: A Little Help needed climbing 3-5 steps with a railing? : A Lot 6 Click Score: 19    End of Session Equipment Utilized During Treatment: Oxygen;Gait belt Activity Tolerance: Patient limited by fatigue Patient left: in chair;with call bell/phone within reach Nurse Communication: Mobility status PT Visit Diagnosis: Unsteadiness on feet (R26.81);Other abnormalities of gait and mobility (R26.89);Muscle weakness (generalized) (M62.81);Difficulty in walking, not elsewhere classified (R26.2)     Time: [id number] PT Time Calculation (min) (ACUTE ONLY): 32 min  Charges:  $Gait Training: 8-22 mins $Therapeutic Activity: 8-22 mins                     Marisa Severin, PT, DPT Acute Rehabilitation Services Pager [phone] Office [place] 06/19/2021, 12:02 PM

## 2021-06-19 NOTE — Plan of Care (Signed)
  Problem: Education: Goal: Understanding of CV disease, CV risk reduction, and recovery process will improve Outcome: Progressing Goal: Individualized Educational Video(s) Outcome: Progressing   Problem: Activity: Goal: Ability to return to baseline activity level will improve Outcome: Progressing   Problem: Cardiovascular: Goal: Ability to achieve and maintain adequate cardiovascular perfusion will improve Outcome: Progressing   

## 2021-06-19 NOTE — Progress Notes (Signed)
PROGRESS NOTE                                                                                                                                                                                                             Patient Demographics:    Veronica Romero, is a 71 y.o. female, DOB - [birth year], DR:[id number]  Outpatient Primary MD for the patient is Pcp, No    LOS - 8  Admit date - 06/11/2021    Chief Complaint  Patient presents with   Cough   Chest Pain   Shortness of Breath       Brief Narrative (HPI from H&P)   71 yo F PMH RA on methotrexate, prednisone, humira, recent COVID-19 infection with minimal sx presented to [facility] 06/11/21 with SOB and cough.  She rapidly developed worsening of her respiratory status and she was intubated and placed in ICU, she underwent diagnostic bronchoscopy, was given IV antibiotics was also seen by cardiology team for elevated troponin and possible heart failure.  She was extubated on 06/14/2021 and transferred to hospitalist service on 06/16/2021.   Subjective:   Patient in bed, appears comfortable, denies any headache, no fever, no chest pain or pressure, improving shortness of breath , no abdominal pain. No new focal weakness.   Assessment  & Plan :    Acute Hypoxic Resp. Failure due to CAP in a patient who is immunocompromised due to combination of Humira, prednisone, methotrexate for her underlying RA - she was initially intubated for 3 days and finally extubated on 06/14/2021, underwent diagnostic bronchoscopy, she was on combination of azithromycin and Zosyn and has finished ABX as of 06/19/21.  Currently on nasal cannula oxygen 2 L along with supportive care with nebulizer treatments.  Steroids have been tapered off note she takes 5 mg of prednisone at baseline for her RA.  Encouraged the patient to sit up in chair in the daytime use I-S and flutter valve for pulmonary toiletry.  Will  advance activity and titrate down oxygen as possible.   2.  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  Left heart cath done on 06/16/2021 shows significant proximal LAD calcification of close to 80% Ef marginally improved to 30-35%, case discussed with cardiology group on 06/16/2021 and 06/17/2021, placed on dual antiplatelet therapy along  with statin which was started on 06/16/2021, continue diuretics per Cards, currently chest pain-free likely for stent placement at a later date due to her recent pneumonia.  CT repeated on 06/18/18 reviewed with Pulm. Dr Carlis Abbott, resolving PNA + CHF, Procal and CRP.   3.  RA.  Now on her chronic 5 mg of prednisone at baseline.  Methotrexate and Humira on hold.  4.  Alcohol use.  Counseled to cut down, on vitamins, no DTs will monitor.  5.  Normocytic anemia.  Monitor.  6.  Dyslipidemia.  Placed on statin.       Condition - Extremely Guarded  Family Communication  : Daughter bedside on 06/16/2021, 06/17/21, 06/18/21, 06/19/21  Code Status : Full  Consults  : PCCM, cardiology  PUD Prophylaxis : Pepcid   Procedures  :     Intubated on 06/11/2021 extubated 06/14/2021.  Echocardiogram.  1. Left ventricular ejection fraction, by estimation, is 25 to 30%. The left ventricle has severely decreased function. The left ventricle demonstrates regional wall motion abnormalities (see scoring diagram/findings for description). Left ventricular diastolic parameters are consistent with Grade I diastolic dysfunction (impaired relaxation).  2. Right ventricular systolic function is mildly reduced. The right ventricular size is normal. There is mildly elevated pulmonary artery systolic pressure. The estimated right ventricular systolic pressure is 0000000 mmHg.  3. The mitral valve is normal in structure. Trivial mitral valve regurgitation. No evidence of mitral stenosis.  4. The aortic valve is tricuspid. Aortic valve regurgitation is  mild. Aortic valve sclerosis/calcification is present, without any evidence of aortic stenosis.  5. The inferior vena cava is dilated in size with <50% respiratory variability, suggesting right atrial pressure of 15 mmHg.  Left heart cath - Dr Tamala Julian 06/16/2021  CONCLUSIONS: Calcified segmental 85% proximal LAD Dominant circumflex without significant obstruction.  Distally before the PDA there is segmental 50% narrowing. Left main is widely patent Right coronary was poorly visualized, is nondominant, and contains mid 50% stenosis. Low normal to mild systolic dysfunction with EF in the 35 to 45% range.  LVEDP 14 mmHg. Mild pulmonary hypertension with mean PA pressure 22 mmHg; mean wedge pressure 14 mmHg; right atrial mean 6 mmHg.  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   RECOMMENDATION: Consider starting dual antiplatelet therapy if felt safe.  I will recommend Plavix. Continue to treat heart failure and recuperate from pneumonia. Active orbital atherectomy and stenting of the LAD and days 2 weeks once the patient is stronger. Earlier management if the patient has ischemic symptoms. LV function is improving and is clearly better than 25 to 30% on this occasion as noted above. Hemodynamics are reasonable but would start basic guideline directed therapy for heart failure.      Disposition Plan  :    Status is: Inpatient  DVT Prophylaxis  :    enoxaparin (LOVENOX) injection 40 mg Start: 06/16/21 2200 SCDs Start: 06/12/21 [id number]    Lab Results  Component Value Date   PLT 407 (H) 06/19/2021    Diet :  Diet Order             Diet Heart Room service appropriate? Yes; Fluid consistency: Thin  Diet effective now                    Inpatient Medications  Scheduled Meds:  aspirin EC  81 mg Oral Daily   carvedilol  3.125 mg Oral BID WC   clopidogrel  75 mg Oral Daily   dapagliflozin propanediol  10  mg Oral Daily   digoxin  0.125 mg Oral Daily   enoxaparin (LOVENOX) injection  40  mg Subcutaneous QHS   feeding supplement  1 Container Oral BID BM   folic acid  1 mg Oral Daily   insulin aspart  0-9 Units Subcutaneous TID WC   losartan  12.5 mg Oral Daily   mouth rinse  15 mL Mouth Rinse BID   multivitamin with minerals  1 tablet Oral Daily   predniSONE  5 mg Oral Q breakfast   rosuvastatin  10 mg Oral Daily   spironolactone  12.5 mg Oral Daily   thiamine  100 mg Oral Daily   Continuous Infusions:  sodium chloride     piperacillin-tazobactam (ZOSYN)  IV 3.375 g (06/19/21 0636)   PRN Meds:.acetaminophen **OR** acetaminophen, acetaminophen, benzonatate, docusate, levalbuterol, menthol-cetylpyridinium, ondansetron (ZOFRAN) IV, polyethylene glycol, polyvinyl alcohol, sodium chloride, sodium chloride flush  Antibiotics  :    Anti-infectives (From admission, onward)    Start     Dose/Rate Route Frequency Ordered Stop   06/12/21 1000  piperacillin-tazobactam (ZOSYN) IVPB 3.375 g        3.375 g 12.5 mL/hr over 240 Minutes Intravenous Every 8 hours 06/12/21 0947 06/19/21 1359   06/11/21 1800  cefTRIAXone (ROCEPHIN) 2 g in sodium chloride 0.9 % 100 mL IVPB  Status:  Discontinued        2 g 200 mL/hr over 30 Minutes Intravenous Every 24 hours 06/11/21 1749 06/12/21 0911   06/11/21 1800  azithromycin (ZITHROMAX) 500 mg in sodium chloride 0.9 % 250 mL IVPB        500 mg 250 mL/hr over 60 Minutes Intravenous Every 24 hours 06/11/21 1749 06/15/21 1951        Time Spent in minutes  30   Lala Lund M.D on 06/19/2021 at 8:37 AM  To page go to [email or web address]   [facility] -  Office  [phone]  See all Orders from today for further details    Objective:   Vitals:   06/18/21 1901 06/18/21 2305 06/19/21 0308 06/19/21 0625  BP: (!) 97/48 (!) 97/45 (!) 105/51   Pulse: 84 82 80   Resp: 19 18 17    Temp: 97.7 F (36.5 C) 98.3 F (36.8 C) 98 F (36.7 C)   TempSrc: Oral Oral Oral   SpO2: 97% 93% 97%   Weight:    70.6 kg  Height:        Wt Readings  from Last 3 Encounters:  06/19/21 70.6 kg     Intake/Output Summary (Last 24 hours) at 06/19/2021 0837 Last data filed at 06/18/2021 2054 Gross per 24 hour  Intake 120 ml  Output 300 ml  Net -180 ml     Physical Exam  Awake Alert, No new F.N deficits, Normal affect [place]ia.AT,PERRAL Supple Neck, No JVD,   Symmetrical Chest wall movement, Good air movement bilaterally, coarse bilateral breath sounds and crackles RRR,No Gallops, Rubs or new Murmurs,  +ve B.Sounds, Abd Soft, No tenderness,   No Cyanosis, Clubbing or edema     Data Review:    CBC Recent Labs  Lab 06/15/21 0309 06/16/21 0208 06/16/21 1001 06/16/21 1004 06/16/21 1010 06/17/21 0132 06/18/21 0059 06/19/21 0106  WBC 11.8* 11.8*  --   --   --  11.1* 11.6* 11.4*  HGB 9.3* 9.5*   < > 9.2* 9.2* 9.3* 10.0* 9.9*  HCT 27.2* 29.3*   < > 27.0* 27.0* 27.8* 31.1* 30.5*  PLT 357 343  --   --   --  364 388 407*  MCV 91.9 94.5  --   --   --  93.3 96.0 94.4  MCH 31.4 30.6  --   --   --  31.2 30.9 30.7  MCHC 34.2 32.4  --   --   --  33.5 32.2 32.5  RDW 13.7 13.7  --   --   --  13.7 13.8 14.1  LYMPHSABS  --   --   --   --   --  3.8 4.1* 3.5  MONOABS  --   --   --   --   --  1.1* 1.2* 1.3*  EOSABS  --   --   --   --   --  0.4 0.8* 1.1*  BASOSABS  --   --   --   --   --  0.1 0.1 0.1   < > = values in this interval not displayed.    Electrolytes Recent Labs  Lab 06/12/21 1115 06/12/21 1118 06/12/21 1410 06/13/21 0044 06/13/21 0422 06/14/21 1719 06/15/21 0309 06/16/21 0208 06/16/21 1001 06/16/21 1004 06/16/21 1010 06/17/21 0132 06/18/21 0059 06/18/21 1019 06/19/21 0106  NA 139  --    < > 138   < >  --  143 140   < > 142 141 138 138  --  136  K 3.0*  --    < > 3.8   < >  --  3.0* 4.1   < > 3.2* 3.3* 3.3* 3.4*  --  4.0  CL 104  --    < > 108   < >  --  102 102  --   --   --  103 102  --  101  CO2 24  --    < > 21*   < >  --  31 30  --   --   --  26 26  --  26  GLUCOSE 172*  --    < > 192*   < >  --  132* 122*  --    --   --  120* 114*  --  98  BUN 8  --    < > 13   < >  --  14 17  --   --   --  14 11  --  11  CREATININE 0.76  --    < > 0.70   < >  --  0.66 0.63  --   --   --  0.69 0.82  --  0.79  CALCIUM 7.9*  --    < > 8.2*   < >  --  7.8* 8.3*  --   --   --  8.4* 8.5*  --  8.6*  AST 116*  --   --   --   --   --   --   --   --   --   --  55* 49*  --  37  ALT 35  --   --   --   --   --   --   --   --   --   --  40 42  --  41  ALKPHOS 117  --   --   --   --   --   --   --   --   --   --  70 64  --  62  BILITOT 1.3*  --   --   --   --   --   --   --   --   --   --  0.5 0.3  --  0.3  ALBUMIN 2.8*  --   --   --   --   --   --   --   --   --   --  2.3* 2.4*  --  2.5*  MG 1.8  --   --  2.7*  --   --  2.1 2.3  --   --   --  2.3 2.0  --  2.2  CRP  --   --   --   --   --   --   --   --   --   --   --   --   --  2.1* 1.7*  PROCALCITON 1.21  --   --  5.47  --   --   --   --   --   --   --   --   --  <0.10 <0.10  LATICACIDVEN 1.4  --   --   --   --   --   --   --   --   --   --   --   --   --   --   HGBA1C  --  5.4  --   --   --   --   --   --   --   --   --   --   --   --   --   BNP 271.8*  --   --   --   --  541.6*  --   --   --   --   --  697.8* 437.0*  --  207.6*   < > = values in this interval not displayed.    ------------------------------------------------------------------------------------------------------------------ No results for input(s): CHOL, HDL, LDLCALC, TRIG, CHOLHDL, LDLDIRECT in the last 72 hours.   Lab Results  Component Value Date   HGBA1C 5.4 06/12/2021     Radiology Reports CT CHEST WO CONTRAST  Result Date: 06/18/2021 CLINICAL DATA:  Complicated pneumonia follow-up EXAM: CT CHEST WITHOUT CONTRAST TECHNIQUE: Multidetector CT imaging of the chest was performed following the standard protocol without IV contrast. RADIATION DOSE REDUCTION: This exam was performed according to the departmental dose-optimization program which includes automated exposure control, adjustment of the mA  and/or kV according to patient size and/or use of iterative reconstruction technique. COMPARISON:  CT chest 06/11/2021 FINDINGS: Cardiovascular: Heart is mildly enlarged. No pericardial effusion identified. Extensive coronary artery calcifications. Main pulmonary artery is upper normal caliber. Thoracic aorta is normal in caliber with moderate calcified plaques. Mediastinum/Nodes: No bulky axillary or mediastinal lymphadenopathy identified. Likely prominent bilateral hilar lymph nodes, limited evaluation without contrast. Lungs/Pleura: Persistent extensive increased interstitial densities and irregular ground-glass alveolar densities throughout both lungs. The ground-glass infiltrates are decreased in size and density since previous study with overall improved aeration of the lungs. No pleural effusion. No pneumothorax. Upper Abdomen: Moderate-sized hiatal hernia. Musculoskeletal: Degenerative changes of the thoracic spine and old compression fracture in the lower thoracic spine. No suspicious bony lesions identified. IMPRESSION: 1. Improvement since previous study, however persistent extensive interstitial and irregular ground-glass infiltrates bilaterally. 2. Mild cardiomegaly. Coronary artery disease and atherosclerotic disease. 3. Moderate hiatal hernia. Aortic Atherosclerosis (ICD10-I70.0). Electronically Signed   By: Ofilia Neas M.D.   On: 06/18/2021 13:08   CARDIAC CATHETERIZATION  Addendum Date: 06/17/2021     Ost LAD to Mid LAD lesion is 85% stenosed.   Prox RCA lesion is 50% stenosed.  LPAV lesion is 50% stenosed.   There is mild to moderate left ventricular systolic dysfunction.   LV end diastolic pressure is normal.   LV end diastolic pressure is normal.   The left ventricular ejection fraction is 35-45% by visual estimate.   Hemodynamic findings consistent with mild pulmonary hypertension. CONCLUSIONS: Calcified segmental 85% proximal LAD Dominant circumflex without significant obstruction.   Distally before the PDA there is segmental 50% narrowing. Left main is widely patent Right coronary was poorly visualized, is nondominant, and contains mid 50% stenosis. Low normal to mild systolic dysfunction with EF in the 35 to 45% range.  LVEDP 14 mmHg. Mild pulmonary hypertension with mean PA pressure 22 mmHg; mean wedge pressure 14 mmHg; right atrial mean 6 mmHg.  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 RECOMMENDATION: Consider starting dual antiplatelet therapy if felt safe.  Recommend Plavix 75 mg/day. Continue to treat heart failure and recuperation from pneumonia. Orbital atherectomy and stenting of the LAD in days to weeks once the patient is stronger. Earlier percutaneous management if the patient has ischemic symptoms. LV function is improving and is clearly better than 25 to 30% as noted above. Hemodynamics are reasonable and would start basic guideline directed therapy for heart failure.  Addendum Date: 06/16/2021     Ost LAD to Mid LAD lesion is 85% stenosed.   Prox RCA lesion is 50% stenosed.   LPAV lesion is 50% stenosed.   There is mild to moderate left ventricular systolic dysfunction.   LV end diastolic pressure is normal.   LV end diastolic pressure is normal.   The left ventricular ejection fraction is 35-45% by visual estimate.   Hemodynamic findings consistent with mild pulmonary hypertension. CONCLUSIONS: Calcified segmental 85% proximal LAD Dominant circumflex without significant obstruction.  Distally before the PDA there is segmental 50% narrowing. Left main is widely patent Right coronary was poorly visualized, is nondominant, and contains mid 50% stenosis. Low normal to mild systolic dysfunction with EF in the 35 to 45% range.  LVEDP 14 mmHg. Mild pulmonary hypertension with mean PA pressure 22 mmHg; mean wedge pressure 14 mmHg; right atrial mean 6 mmHg.  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 RECOMMENDATION: Consider starting dual antiplatelet therapy if felt safe.  I will recommend  Plavix. Continue to treat heart failure and recuperate from pneumonia. Active orbital atherectomy and stenting of the LAD and days 2 weeks once the patient is stronger. Earlier management if the patient has ischemic symptoms. LV function is improving and is clearly better than 25 to 30% on this occasion as noted above. Hemodynamics are reasonable but would start basic guideline directed therapy for heart failure.   Result Date: 06/17/2021   Ost LAD to Mid LAD lesion is 85% stenosed.   Prox RCA lesion is 50% stenosed.   LPAV lesion is 50% stenosed.   There is mild to moderate left ventricular systolic dysfunction.   LV end diastolic pressure is normal.   LV end diastolic pressure is normal.   The left ventricular ejection fraction is 35-45% by visual estimate.   Hemodynamic findings consistent with mild pulmonary hypertension. CONCLUSIONS: Calcified segmental 85% proximal LAD Dominant circumflex without significant obstruction.  Distally before the PDA there is segmental 50% narrowing. Left main is widely patent Right coronary was poorly visualized, is nondominant, and contains mid 50% stenosis. Low normal to mild systolic dysfunction with EF in the 35 to 45% range.  LVEDP 14 mmHg. Mild pulmonary hypertension with mean PA pressure 22 mmHg; mean wedge pressure 14  mmHg; right atrial mean 6 mmHg.  Pulmonary hypertension group 2. RECOMMENDATION: Consider starting dual antiplatelet therapy if felt safe.  I will recommend Plavix. Continue to treat heart failure and recuperate from pneumonia. Active orbital atherectomy and stenting of the LAD and days 2 weeks once the patient is stronger. Earlier management if the patient has ischemic symptoms. LV function is improving and is clearly better than 25 to 30% on this occasion as noted above. Hemodynamics are reasonable but would start basic guideline directed therapy for heart failure.  DG Chest Port 1 View  Result Date: 06/18/2021 CLINICAL DATA:  Cough, shortness of  breath and chest pain. EXAM: PORTABLE CHEST 1 VIEW COMPARISON:  06/17/2021 FINDINGS: Stable cardiomediastinal contours. Lung volumes are low. Bilateral interstitial and airspace densities are again noted. These are not significantly changed when compared with the previous exam. No new findings. IMPRESSION: No change in bilateral interstitial and patchy airspace opacities. Electronically Signed   By: Kerby Moors M.D.   On: 06/18/2021 08:59   DG Chest Port 1 View  Result Date: 06/17/2021 CLINICAL DATA:  Shortness of breath. EXAM: PORTABLE CHEST 1 VIEW COMPARISON:  06/12/2021 FINDINGS: Heart size is normal. Bilateral interstitial and airspace opacities are again noted and are unchanged compared with the previous exam. Lung volumes remain low. No pleural effusion identified. IMPRESSION: No change in aeration to the lungs compared with previous exam. Electronically Signed   By: Kerby Moors M.D.   On: 06/17/2021 07:11

## 2021-06-19 NOTE — Progress Notes (Signed)
Mobility Specialist Criteria Algorithm Info.    06/19/21 1023  Mobility  Activity Ambulated with assistance in hallway  Range of Motion/Exercises Active;All extremities  Level of Assistance Modified independent, requires aide device or extra time; Supervision  Assistive Device Four wheel walker (Rolator)  Distance Ambulated (ft) 110 ft (20 x 15 x 20 x 15 x 20 x 15)  Activity Response Tolerated well  Transport method Ambulatory   Patient receive in chair eager to participate. Ambulated in hallway with slow steady gait at supervision/mod I. Progressed well with ambulation, extending distance by conserving energy with multiple bouts of seated rests. O2 sats 85-94% on 6L per advisement of PT and RN. SPO2 recovered quickly with rest saturating mid 90's. Returned to room without complaint or incident. Was left in recliner chair with all needs met, call bell in reach.   06/19/2021 2:35 PM  [place] Braylei Totino, [place], [place]  FMZUA:[phone] Office: [phone]

## 2021-06-19 NOTE — Progress Notes (Signed)
Cardiology Progress Note  Patient ID: Veronica Romero MRN: [id number] DOB: 12-07-1950 Date of Encounter: 06/19/2021  Primary Cardiologist: Berniece Salines, DO  Subjective   Chief Complaint: Shortness of breath  HPI: ON 3L O2. Net negative 850 cc on 40 IV lasix . She is not reporting chest pain  ROS:  All other ROS reviewed and negative. Pertinent positives noted in the HPI.     Inpatient Medications  Scheduled Meds:  aspirin EC  81 mg Oral Daily   carvedilol  3.125 mg Oral BID WC   clopidogrel  75 mg Oral Daily   dapagliflozin propanediol  10 mg Oral Daily   digoxin  0.125 mg Oral Daily   enoxaparin (LOVENOX) injection  40 mg Subcutaneous QHS   feeding supplement  1 Container Oral BID BM   folic acid  1 mg Oral Daily   furosemide  40 mg Intravenous Once   insulin aspart  0-9 Units Subcutaneous TID WC   losartan  12.5 mg Oral Daily   mouth rinse  15 mL Mouth Rinse BID   multivitamin with minerals  1 tablet Oral Daily   predniSONE  5 mg Oral Q breakfast   rosuvastatin  10 mg Oral Daily   spironolactone  12.5 mg Oral Daily   thiamine  100 mg Oral Daily   Continuous Infusions:  sodium chloride     piperacillin-tazobactam (ZOSYN)  IV 3.375 g (06/19/21 0636)   PRN Meds: acetaminophen **OR** acetaminophen, acetaminophen, benzonatate, docusate, levalbuterol, menthol-cetylpyridinium, ondansetron (ZOFRAN) IV, polyethylene glycol, polyvinyl alcohol, sodium chloride, sodium chloride flush   Vital Signs   Vitals:   06/18/21 1901 06/18/21 2305 06/19/21 0308 06/19/21 0625  BP: (!) 97/48 (!) 97/45 (!) 105/51   Pulse: 84 82 80   Resp: 19 18 17    Temp: 97.7 F (36.5 C) 98.3 F (36.8 C) 98 F (36.7 C)   TempSrc: Oral Oral Oral   SpO2: 97% 93% 97%   Weight:    70.6 kg  Height:        Intake/Output Summary (Last 24 hours) at 06/19/2021 0918 Last data filed at 06/18/2021 2054 Gross per 24 hour  Intake 120 ml  Output 300 ml  Net -180 ml   Last 3 Weights 06/19/2021 06/18/2021  06/17/2021  Weight (lbs) 155 lb 10.3 oz 144 lb 9.6 oz 156 lb 12 oz  Weight (kg) 70.6 kg 65.59 kg 71.1 kg      Telemetry  Sinus rhythm  Physical Exam   Vitals:   06/18/21 1901 06/18/21 2305 06/19/21 0308 06/19/21 0625  BP: (!) 97/48 (!) 97/45 (!) 105/51   Pulse: 84 82 80   Resp: 19 18 17    Temp: 97.7 F (36.5 C) 98.3 F (36.8 C) 98 F (36.7 C)   TempSrc: Oral Oral Oral   SpO2: 97% 93% 97%   Weight:    70.6 kg  Height:        Intake/Output Summary (Last 24 hours) at 06/19/2021 0918 Last data filed at 06/18/2021 2054 Gross per 24 hour  Intake 120 ml  Output 300 ml  Net -180 ml    Last 3 Weights 06/19/2021 06/18/2021 06/17/2021  Weight (lbs) 155 lb 10.3 oz 144 lb 9.6 oz 156 lb 12 oz  Weight (kg) 70.6 kg 65.59 kg 71.1 kg    Body mass index is 25.12 kg/m.   Physical Exam Gen: witting in a chair, frail, no acute disturess Neuro: alert and oriented CV: r,r,r no murmurs. mild JVD Pulm: on 3L,  normal wob,  decreased BS with inspiratory crackles Abd: non distended Ext: No LE edema Skin: warm and well perfused Psych: normal mood   Labs  High Sensitivity Troponin:   Recent Labs  Lab 06/11/21 1645 06/11/21 2229 06/12/21 1115 06/12/21 1627 06/13/21 0553  TROPONINIHS 25* 19* 768* 3,044* 1,806*     Cardiac EnzymesNo results for input(s): TROPONINI in the last 168 hours. No results for input(s): TROPIPOC in the last 168 hours.  Chemistry Recent Labs  Lab 06/17/21 0132 06/18/21 0059 06/19/21 0106  NA 138 138 136  K 3.3* 3.4* 4.0  CL 103 102 101  CO2 26 26 26   GLUCOSE 120* 114* 98  BUN 14 11 11   CREATININE 0.69 0.82 0.79  CALCIUM 8.4* 8.5* 8.6*  PROT 6.2* 6.4* 6.5  ALBUMIN 2.3* 2.4* 2.5*  AST 55* 49* 37  ALT 40 42 41  ALKPHOS 70 64 62  BILITOT 0.5 0.3 0.3  GFRNONAA >60 >60 >60  ANIONGAP 9 10 9     Hematology Recent Labs  Lab 06/17/21 0132 06/18/21 0059 06/19/21 0106  WBC 11.1* 11.6* 11.4*  RBC 2.98* 3.24* 3.23*  HGB 9.3* 10.0* 9.9*  HCT 27.8* 31.1*  30.5*  MCV 93.3 96.0 94.4  MCH 31.2 30.9 30.7  MCHC 33.5 32.2 32.5  RDW 13.7 13.8 14.1  PLT 364 388 407*   BNP Recent Labs  Lab 06/17/21 0132 06/18/21 0059 06/19/21 0106  BNP 697.8* 437.0* 207.6*    DDimer No results for input(s): DDIMER in the last 168 hours.   Radiology  CT CHEST WO CONTRAST  Result Date: 06/18/2021 CLINICAL DATA:  Complicated pneumonia follow-up EXAM: CT CHEST WITHOUT CONTRAST TECHNIQUE: Multidetector CT imaging of the chest was performed following the standard protocol without IV contrast. RADIATION DOSE REDUCTION: This exam was performed according to the departmental dose-optimization program which includes automated exposure control, adjustment of the mA and/or kV according to patient size and/or use of iterative reconstruction technique. COMPARISON:  CT chest 06/11/2021 FINDINGS: Cardiovascular: Heart is mildly enlarged. No pericardial effusion identified. Extensive coronary artery calcifications. Main pulmonary artery is upper normal caliber. Thoracic aorta is normal in caliber with moderate calcified plaques. Mediastinum/Nodes: No bulky axillary or mediastinal lymphadenopathy identified. Likely prominent bilateral hilar lymph nodes, limited evaluation without contrast. Lungs/Pleura: Persistent extensive increased interstitial densities and irregular ground-glass alveolar densities throughout both lungs. The ground-glass infiltrates are decreased in size and density since previous study with overall improved aeration of the lungs. No pleural effusion. No pneumothorax. Upper Abdomen: Moderate-sized hiatal hernia. Musculoskeletal: Degenerative changes of the thoracic spine and old compression fracture in the lower thoracic spine. No suspicious bony lesions identified. IMPRESSION: 1. Improvement since previous study, however persistent extensive interstitial and irregular ground-glass infiltrates bilaterally. 2. Mild cardiomegaly. Coronary artery disease and atherosclerotic  disease. 3. Moderate hiatal hernia. Aortic Atherosclerosis (ICD10-I70.0). Electronically Signed   By: Ofilia Neas M.D.   On: 06/18/2021 13:08   DG Chest Port 1 View  Result Date: 06/18/2021 CLINICAL DATA:  Cough, shortness of breath and chest pain. EXAM: PORTABLE CHEST 1 VIEW COMPARISON:  06/17/2021 FINDINGS: Stable cardiomediastinal contours. Lung volumes are low. Bilateral interstitial and airspace densities are again noted. These are not significantly changed when compared with the previous exam. No new findings. IMPRESSION: No change in bilateral interstitial and patchy airspace opacities. Electronically Signed   By: Kerby Moors M.D.   On: 06/18/2021 08:59    Cardiac Studies  TTE 06/12/2021  1. Left ventricular ejection fraction, by estimation, is 25  to 30%. The  left ventricle has severely decreased function. The left ventricle  demonstrates regional wall motion abnormalities (see scoring  diagram/findings for description). Left ventricular  diastolic parameters are consistent with Grade I diastolic dysfunction  (impaired relaxation).   2. Right ventricular systolic function is mildly reduced. The right  ventricular size is normal. There is mildly elevated pulmonary artery  systolic pressure. The estimated right ventricular systolic pressure is  0000000 mmHg.   3. The mitral valve is normal in structure. Trivial mitral valve  regurgitation. No evidence of mitral stenosis.   4. The aortic valve is tricuspid. Aortic valve regurgitation is mild.  Aortic valve sclerosis/calcification is present, without any evidence of  aortic stenosis.   5. The inferior vena cava is dilated in size with <50% respiratory  variability, suggesting right atrial pressure of 15 mmHg.   LHC/RHC 06/16/2021   Ost LAD to Mid LAD lesion is 85% stenosed.   Prox RCA lesion is 50% stenosed.   LPAV lesion is 50% stenosed.   There is mild to moderate left ventricular systolic dysfunction.   LV end diastolic  pressure is normal.   LV end diastolic pressure is normal.   The left ventricular ejection fraction is 35-45% by visual estimate.   Hemodynamic findings consistent with mild pulmonary hypertension.  Cardiac Index 4.1 -normal   CONCLUSIONS: Calcified segmental 85% proximal LAD Dominant circumflex without significant obstruction.  Distally before the PDA there is segmental 50% narrowing. Left main is widely patent Right coronary was poorly visualized, is nondominant, and contains mid 50% stenosis. Low normal to mild systolic dysfunction with EF in the 35 to 45% range.  LVEDP 14 mmHg. Mild pulmonary hypertension with mean PA pressure 22 mmHg; mean wedge pressure 14 mmHg; right atrial mean 6 mmHg.  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  Patient Profile  Veronica Romero is a 71 y.o. female with rheumatoid arthritis who was admitted on 06/14/2021 with acute hypoxic respiratory failure secondary to pneumonia.  Found to have new onset systolic heart failure.  Left heart catheterization with severe LAD stenosis.  Assessment & Plan   #NSTEMI: cath 85% prox LAD lesion. 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  Found to have secondary myocardial infarction.  Left heart catheterization with severe proximal LAD stenosis.  Plan for medical management with staged intervention when she is recovered from pneumonia.  -Continue aspirin 81 mg daily and Plavix 75 mg daily. -On statin -EF 25-30%.  Treating as below.  #Acute systolic heart failure, EF 25-30%; presentation complicated by PNA RV function is mildly reduced. 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 - fairly euvolemic, still has crackles and on O2, will re-dose with IV lasix 40 mg today. Aim to wean O2. Will monitor renal fxn. Will transition to oral lasix soon -weights 75 kg to 70 kg -cont Coreg 3.125 mg twice daily (if Bps are soft <90s,  can hold this) -Digoxin 0.125 mg daily was added over the weekend to assist with blood pressure and tolerating GDMT.  Can continue this   - continue  Losartan 12.5 mg daily was added.  Transition to [facility] as you are able.  Given soft BP do not suspect she will tolerate this.   -Continue Aldactone 12.5 mg daily. -Suspect she can be transition to oral diuretics tomorrow.  Follow-up chest x-ray and response to IV diuresis today.  Again a lot of this appears to be  pneumonia.    For questions or updates, please contact [place] Please consult [email or web address] for contact info under     Janina Mayo, MD

## 2021-06-20 DIAGNOSIS — I214 Non-ST elevation (NSTEMI) myocardial infarction: Secondary | ICD-10-CM | POA: Diagnosis not present

## 2021-06-20 DIAGNOSIS — J9601 Acute respiratory failure with hypoxia: Secondary | ICD-10-CM | POA: Diagnosis not present

## 2021-06-20 DIAGNOSIS — I5021 Acute systolic (congestive) heart failure: Secondary | ICD-10-CM | POA: Diagnosis not present

## 2021-06-20 LAB — C-REACTIVE PROTEIN: CRP: 1.9 mg/dL — ABNORMAL HIGH (ref <1.0)

## 2021-06-20 LAB — GLUCOSE, CAPILLARY
Glucose-Capillary: 93 mg/dL (ref 70–99)
Glucose-Capillary: 124 mg/dL — ABNORMAL HIGH (ref 70–99)
Glucose-Capillary: 107 mg/dL — ABNORMAL HIGH (ref 70–99)
Glucose-Capillary: 116 mg/dL — ABNORMAL HIGH (ref 70–99)

## 2021-06-20 LAB — CBC WITH DIFFERENTIAL/PLATELET
Abs Immature Granulocytes: 0.15 10*3/uL — ABNORMAL HIGH (ref 0.00–0.07)
Basophils Absolute: 0.1 10*3/uL (ref 0.0–0.1)
Basophils Relative: 1 %
Eosinophils Absolute: 1 10*3/uL — ABNORMAL HIGH (ref 0.0–0.5)
Eosinophils Relative: 9 %
HCT: 31.8 % — ABNORMAL LOW (ref 36.0–46.0)
Hemoglobin: 10.2 g/dL — ABNORMAL LOW (ref 12.0–15.0)
Immature Granulocytes: 1 %
Lymphocytes Relative: 30 %
Lymphs Abs: 3.5 10*3/uL (ref 0.7–4.0)
MCH: 30.9 pg (ref 26.0–34.0)
MCHC: 32.1 g/dL (ref 30.0–36.0)
MCV: 96.4 fL (ref 80.0–100.0)
Monocytes Absolute: 1.4 10*3/uL — ABNORMAL HIGH (ref 0.1–1.0)
Monocytes Relative: 12 %
Neutro Abs: 5.6 10*3/uL (ref 1.7–7.7)
Neutrophils Relative %: 47 %
Platelets: 389 10*3/uL (ref 150–400)
RBC: 3.3 MIL/uL — ABNORMAL LOW (ref 3.87–5.11)
RDW: 14.2 % (ref 11.5–15.5)
WBC: 11.7 10*3/uL — ABNORMAL HIGH (ref 4.0–10.5)
nRBC: 0 % (ref 0.0–0.2)

## 2021-06-20 LAB — PROCALCITONIN: Procalcitonin: <0.1 ng/mL

## 2021-06-20 LAB — COMPREHENSIVE METABOLIC PANEL
ALT: 32 U/L (ref 0–44)
AST: 28 U/L (ref 15–41)
Albumin: 2.7 g/dL — ABNORMAL LOW (ref 3.5–5.0)
Alkaline Phosphatase: 61 U/L (ref 38–126)
Anion gap: 12 (ref 5–15)
BUN: 13 mg/dL (ref 8–23)
CO2: 22 mmol/L (ref 22–32)
Calcium: 8.7 mg/dL — ABNORMAL LOW (ref 8.9–10.3)
Chloride: 103 mmol/L (ref 98–111)
Creatinine, Ser: 0.71 mg/dL (ref 0.44–1.00)
GFR, Estimated: >60 mL/min (ref >60)
Glucose, Bld: 90 mg/dL (ref 70–99)
Potassium: 4.2 mmol/L (ref 3.5–5.1)
Sodium: 137 mmol/L (ref 135–145)
Total Bilirubin: 0.4 mg/dL (ref 0.3–1.2)
Total Protein: 6.7 g/dL (ref 6.5–8.1)

## 2021-06-20 LAB — BRAIN NATRIURETIC PEPTIDE: B Natriuretic Peptide: 171.8 pg/mL — ABNORMAL HIGH (ref 0.0–100.0)

## 2021-06-20 LAB — MAGNESIUM: Magnesium: 2.3 mg/dL (ref 1.7–2.4)

## 2021-06-20 MED ORDER — FUROSEMIDE 10 MG/ML IJ SOLN
40.0000 mg | Freq: Once | INTRAMUSCULAR | Status: AC
  Administered 2021-06-20: 40 mg via INTRAVENOUS
  Filled 2021-06-20 (×2): qty 4

## 2021-06-20 MED ORDER — ROSUVASTATIN CALCIUM 20 MG PO TABS
20.0000 mg | ORAL_TABLET | Freq: Every day | ORAL | Status: DC
  Administered 2021-06-21 – 2021-06-22 (×2): 20 mg via ORAL
  Filled 2021-06-20 (×2): qty 1

## 2021-06-20 MED ORDER — MENTHOL 3 MG MT LOZG
1.0000 | LOZENGE | OROMUCOSAL | Status: DC | PRN
  Filled 2021-06-20: qty 9

## 2021-06-20 MED ORDER — MENTHOL 3 MG MT LOZG: 1.0000 | LOZENGE | OROMUCOSAL | Status: DC | PRN

## 2021-06-20 NOTE — Progress Notes (Signed)
Cardiology Progress Note  Patient ID: Veronica Romero MRN: [id number] DOB: 1950/08/07 Date of Encounter: 06/20/2021  Primary Cardiologist: Berniece Salines, DO  Subjective   Chief Complaint: Shortness of breath  HPI: ON 3L O2. Essentially close to net even. She is chest pain free. Discussed with her daughter that PNA recovery may take some time. Therefore PCI may be outpatient after she recovers.  ROS:  All other ROS reviewed and negative. Pertinent positives noted in the HPI.     Inpatient Medications  Scheduled Meds:  aspirin EC  81 mg Oral Daily   carvedilol  3.125 mg Oral BID WC   clopidogrel  75 mg Oral Daily   dapagliflozin propanediol  10 mg Oral Daily   digoxin  0.125 mg Oral Daily   enoxaparin (LOVENOX) injection  40 mg Subcutaneous QHS   feeding supplement  1 Container Oral BID BM   folic acid  1 mg Oral Daily   furosemide  40 mg Intravenous Once   insulin aspart  0-9 Units Subcutaneous TID WC   losartan  12.5 mg Oral Daily   mouth rinse  15 mL Mouth Rinse BID   multivitamin with minerals  1 tablet Oral Daily   predniSONE  5 mg Oral Q breakfast   rosuvastatin  10 mg Oral Daily   spironolactone  12.5 mg Oral Daily   thiamine  100 mg Oral Daily   Continuous Infusions:  sodium chloride     PRN Meds: acetaminophen **OR** acetaminophen, acetaminophen, benzonatate, docusate, levalbuterol, ondansetron (ZOFRAN) IV, polyethylene glycol, polyvinyl alcohol, sodium chloride, sodium chloride flush   Vital Signs   Vitals:   06/19/21 2253 06/20/21 0315 06/20/21 0638 06/20/21 0753  BP: (!) 126/54 100/86 (!) 95/49 (!) 99/46  Pulse: 89 92 88 92  Resp: 20 16 18 19   Temp: 97.8 F (36.6 C) 98.2 F (36.8 C)  (!) 97.5 F (36.4 C)  TempSrc: Oral Oral    SpO2: 94% 96%  95%  Weight:      Height:        Intake/Output Summary (Last 24 hours) at 06/20/2021 0839 Last data filed at 06/19/2021 2253 Gross per 24 hour  Intake 480 ml  Output 300 ml  Net 180 ml   Last 3 Weights  06/19/2021 06/18/2021 06/17/2021  Weight (lbs) 155 lb 10.3 oz 144 lb 9.6 oz 156 lb 12 oz  Weight (kg) 70.6 kg 65.59 kg 71.1 kg      Telemetry  Sinus rhythm  Physical Exam   Vitals:   06/19/21 2253 06/20/21 0315 06/20/21 0638 06/20/21 0753  BP: (!) 126/54 100/86 (!) 95/49 (!) 99/46  Pulse: 89 92 88 92  Resp: 20 16 18 19   Temp: 97.8 F (36.6 C) 98.2 F (36.8 C)  (!) 97.5 F (36.4 C)  TempSrc: Oral Oral    SpO2: 94% 96%  95%  Weight:      Height:        Intake/Output Summary (Last 24 hours) at 06/20/2021 0839 Last data filed at 06/19/2021 2253 Gross per 24 hour  Intake 480 ml  Output 300 ml  Net 180 ml    Last 3 Weights 06/19/2021 06/18/2021 06/17/2021  Weight (lbs) 155 lb 10.3 oz 144 lb 9.6 oz 156 lb 12 oz  Weight (kg) 70.6 kg 65.59 kg 71.1 kg    Body mass index is 25.12 kg/m.   Physical Exam Gen: witting in a chair, frail, no acute disturess Neuro: alert and oriented CV: r,r,r no murmurs.  Pulm:  on 3L, mild wob,  decreased BS with inspiratory crackles Abd: non distended Ext: No LE edema Skin: warm and well perfused Psych: normal mood   Labs  High Sensitivity Troponin:   Recent Labs  Lab 06/11/21 1645 06/11/21 2229 06/12/21 1115 06/12/21 1627 06/13/21 0553  TROPONINIHS 25* 19* 768* 3,044* 1,806*     Cardiac EnzymesNo results for input(s): TROPONINI in the last 168 hours. No results for input(s): TROPIPOC in the last 168 hours.  Chemistry Recent Labs  Lab 06/18/21 0059 06/19/21 0106 06/20/21 0100  NA 138 136 137  K 3.4* 4.0 4.2  CL 102 101 103  CO2 26 26 22   GLUCOSE 114* 98 90  BUN 11 11 13   CREATININE 0.82 0.79 0.71  CALCIUM 8.5* 8.6* 8.7*  PROT 6.4* 6.5 6.7  ALBUMIN 2.4* 2.5* 2.7*  AST 49* 37 28  ALT 42 41 32  ALKPHOS 64 62 61  BILITOT 0.3 0.3 0.4  GFRNONAA >60 >60 >60  ANIONGAP 10 9 12     Hematology Recent Labs  Lab 06/18/21 0059 06/19/21 0106 06/20/21 0100  WBC 11.6* 11.4* 11.7*  RBC 3.24* 3.23* 3.30*  HGB 10.0* 9.9* 10.2*  HCT  31.1* 30.5* 31.8*  MCV 96.0 94.4 96.4  MCH 30.9 30.7 30.9  MCHC 32.2 32.5 32.1  RDW 13.8 14.1 14.2  PLT 388 407* 389   BNP Recent Labs  Lab 06/18/21 0059 06/19/21 0106 06/20/21 0100  BNP 437.0* 207.6* 171.8*    DDimer No results for input(s): DDIMER in the last 168 hours.   Radiology  CT CHEST WO CONTRAST  Result Date: 06/18/2021 CLINICAL DATA:  Complicated pneumonia follow-up EXAM: CT CHEST WITHOUT CONTRAST TECHNIQUE: Multidetector CT imaging of the chest was performed following the standard protocol without IV contrast. RADIATION DOSE REDUCTION: This exam was performed according to the departmental dose-optimization program which includes automated exposure control, adjustment of the mA and/or kV according to patient size and/or use of iterative reconstruction technique. COMPARISON:  CT chest 06/11/2021 FINDINGS: Cardiovascular: Heart is mildly enlarged. No pericardial effusion identified. Extensive coronary artery calcifications. Main pulmonary artery is upper normal caliber. Thoracic aorta is normal in caliber with moderate calcified plaques. Mediastinum/Nodes: No bulky axillary or mediastinal lymphadenopathy identified. Likely prominent bilateral hilar lymph nodes, limited evaluation without contrast. Lungs/Pleura: Persistent extensive increased interstitial densities and irregular ground-glass alveolar densities throughout both lungs. The ground-glass infiltrates are decreased in size and density since previous study with overall improved aeration of the lungs. No pleural effusion. No pneumothorax. Upper Abdomen: Moderate-sized hiatal hernia. Musculoskeletal: Degenerative changes of the thoracic spine and old compression fracture in the lower thoracic spine. No suspicious bony lesions identified. IMPRESSION: 1. Improvement since previous study, however persistent extensive interstitial and irregular ground-glass infiltrates bilaterally. 2. Mild cardiomegaly. Coronary artery disease and  atherosclerotic disease. 3. Moderate hiatal hernia. Aortic Atherosclerosis (ICD10-I70.0). Electronically Signed   By: Ofilia Neas M.D.   On: 06/18/2021 13:08    Cardiac Studies  TTE 06/12/2021  1. Left ventricular ejection fraction, by estimation, is 25 to 30%. The  left ventricle has severely decreased function. The left ventricle  demonstrates regional wall motion abnormalities (see scoring  diagram/findings for description). Left ventricular  diastolic parameters are consistent with Grade I diastolic dysfunction  (impaired relaxation).   2. Right ventricular systolic function is mildly reduced. The right  ventricular size is normal. There is mildly elevated pulmonary artery  systolic pressure. The estimated right ventricular systolic pressure is  0000000 mmHg.   3. The mitral  valve is normal in structure. Trivial mitral valve  regurgitation. No evidence of mitral stenosis.   4. The aortic valve is tricuspid. Aortic valve regurgitation is mild.  Aortic valve sclerosis/calcification is present, without any evidence of  aortic stenosis.   5. The inferior vena cava is dilated in size with <50% respiratory  variability, suggesting right atrial pressure of 15 mmHg.   LHC/RHC 06/16/2021   Ost LAD to Mid LAD lesion is 85% stenosed.   Prox RCA lesion is 50% stenosed.   LPAV lesion is 50% stenosed.   There is mild to moderate left ventricular systolic dysfunction.   LV end diastolic pressure is normal.   LV end diastolic pressure is normal.   The left ventricular ejection fraction is 35-45% by visual estimate.   Hemodynamic findings consistent with mild pulmonary hypertension.  Cardiac Index 4.1 -normal   CONCLUSIONS: Calcified segmental 85% proximal LAD Dominant circumflex without significant obstruction.  Distally before the PDA there is segmental 50% narrowing. Left main is widely patent Right coronary was poorly visualized, is nondominant, and contains mid 50% stenosis. Low  normal to mild systolic dysfunction with EF in the 35 to 45% range.  LVEDP 14 mmHg. Mild pulmonary hypertension with mean PA pressure 22 mmHg; mean wedge pressure 14 mmHg; right atrial mean 6 mmHg.  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  Patient Profile  Veronica Romero is a 71 y.o. female with rheumatoid arthritis who was admitted on 06/14/2021 with acute hypoxic respiratory failure secondary to pneumonia in the setting of immunosuppression.  Found to have new onset systolic heart failure.  Left heart catheterization with severe LAD stenosis   Assessment & Plan   #NSTEMI: cath 85% prox LAD lesion; stable. Anterior wall from base to apex is akinetic -Admitted with pneumonia.  Found to have secondary myocardial infarction.  Left heart catheterization with severe proximal LAD stenosis.  Plan for medical management with staged intervention when she is recovered from pneumonia; may take a little while. If she is planned to DC on O2, will plan for [facility] as an outpatient and arrange for follow-up -Continue aspirin 81 mg daily and Plavix 75 mg daily  -On statin -EF 25-30%.  Treating as below.  #Acute systolic heart failure, EF 25-30%; presentation complicated by PNA RV function is mildly reduced. 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 - fairly euvolemic, goal to be net even. still has crackles and on O2, will re-dose with IV lasix 40 mg again today. Aim to wean O2. Will monitor renal fxn. Will transition to oral lasix tomorrow -cont Coreg 3.125 mg twice daily (if Bps are soft <90s, can hold this) -Digoxin 0.125 mg daily was added over the weekend to assist with blood pressure and tolerating GDMT.  Can continue this   - continue  Losartan 12.5 mg daily was added.  Transition to [facility] as you are able.  Given soft BP do not suspect she will tolerate this.   -Continue Aldactone 12.5 mg daily.  For questions or  updates, please contact [place] Please consult [email or web address] for contact info under     Janina Mayo, MD

## 2021-06-20 NOTE — Care Management Important Message (Signed)
Important Message  Patient Details  Name: Veronica Romero MRN: [id number] Date of Birth: March 05, 1951   Medicare Important Message Given:  Yes     Dorena Bodo 06/20/2021, 2:36 PM

## 2021-06-20 NOTE — Progress Notes (Signed)
Mobility Specialist Progress Note:   06/20/21 1630  Mobility  Activity Ambulated with assistance in hallway  Level of Assistance Standby assist, set-up cues, supervision of patient - no hands on  Assistive Device Four wheel walker  Distance Ambulated (ft) 300 ft  Activity Response Tolerated well  $Mobility charge 1 Mobility   Pre Mobility: SpO2 97% 4LO2 During Mobility: SpO2 92% 4LO2  Pt agreeable to mobility session at this time. Required 4LO2 during ambulation with SpO2 92% throughout. Pt required only x1 seated rest break throughout session, back in chair with all needs met.   Veronica Romero Acute Rehab Phone: [phone] Office Phone: [phone]

## 2021-06-20 NOTE — Plan of Care (Signed)
°  Problem: Education: Goal: Understanding of CV disease, CV risk reduction, and recovery process will improve Outcome: Progressing   Problem: Activity: Goal: Ability to return to baseline activity level will improve Outcome: Progressing   Problem: Cardiovascular: Goal: Ability to achieve and maintain adequate cardiovascular perfusion will improve Outcome: Progressing   Problem: Health Behavior/Discharge Planning: Goal: Ability to safely manage health-related needs after discharge will improve Outcome: Progressing   Problem: Education: Goal: Knowledge of General Education information will improve Description: Including pain rating scale, medication(s)/side effects and non-pharmacologic comfort measures Outcome: Progressing   Problem: Health Behavior/Discharge Planning: Goal: Ability to manage health-related needs will improve Outcome: Progressing   Problem: Clinical Measurements: Goal: Ability to maintain clinical measurements within normal limits will improve Outcome: Progressing Goal: Will remain free from infection Outcome: Progressing Goal: Diagnostic test results will improve Outcome: Progressing

## 2021-06-20 NOTE — Plan of Care (Signed)
  Problem: Education: Goal: Understanding of CV disease, CV risk reduction, and recovery process will improve Outcome: Progressing Goal: Individualized Educational Video(s) Outcome: Progressing   Problem: Activity: Goal: Ability to return to baseline activity level will improve Outcome: Progressing   Problem: Cardiovascular: Goal: Ability to achieve and maintain adequate cardiovascular perfusion will improve Outcome: Progressing Goal: Vascular access site(s) Level 0-1 will be maintained Outcome: Progressing   Problem: Health Behavior/Discharge Planning: Goal: Ability to safely manage health-related needs after discharge will improve Outcome: Progressing   Problem: Education: Goal: Knowledge of General Education information will improve Description: Including pain rating scale, medication(s)/side effects and non-pharmacologic comfort measures Outcome: Progressing   Problem: Health Behavior/Discharge Planning: Goal: Ability to manage health-related needs will improve Outcome: Progressing   Problem: Clinical Measurements: Goal: Ability to maintain clinical measurements within normal limits will improve Outcome: Progressing Goal: Will remain free from infection Outcome: Progressing Goal: Diagnostic test results will improve Outcome: Progressing Goal: Respiratory complications will improve Outcome: Progressing Goal: Cardiovascular complication will be avoided Outcome: Progressing   Problem: Activity: Goal: Risk for activity intolerance will decrease Outcome: Progressing   Problem: Nutrition: Goal: Adequate nutrition will be maintained Outcome: Progressing   Problem: Coping: Goal: Level of anxiety will decrease Outcome: Progressing   Problem: Elimination: Goal: Will not experience complications related to bowel motility Outcome: Progressing Goal: Will not experience complications related to urinary retention Outcome: Progressing   Problem: Pain Managment: Goal:  General experience of comfort will improve Outcome: Progressing   Problem: Safety: Goal: Ability to remain free from injury will improve Outcome: Progressing   

## 2021-06-20 NOTE — Progress Notes (Signed)
PROGRESS NOTE                                                                                                                                                                                                             Patient Demographics:    Veronica Romero, is a 71 y.o. female, DOB - 1951-02-17, [id number]  Outpatient Primary MD for the patient is Pcp, No    LOS - 9  Admit date - 06/11/2021    Chief Complaint  Patient presents with   Cough   Chest Pain   Shortness of Breath       Brief Narrative (HPI from H&P)   71 yo F PMH RA on methotrexate, prednisone, humira, recent COVID-19 infection with minimal sx presented to [facility] 06/11/21 with SOB and cough.  She rapidly developed worsening of her respiratory status and she was intubated and placed in ICU, she underwent diagnostic bronchoscopy which so far is unrevealing, she was extubated on 06/14/2021, was given IV antibiotics was also seen by cardiology team for elevated troponin and possible heart failure.  She has undergone diagnostic left heart cath showing significant CAD, stent was not placed due to pneumonia.  She was transferred to hospitalist service on 06/16/2021.  She is still getting diuresed and CHF part is being managed by cardiology, awaits repeat left heart cath for stenting which was delayed due to pneumonia.   Subjective:   Patient in bed, appears comfortable, denies any headache, no fever, no chest pain or pressure, no shortness of breath , no abdominal pain. No new focal weakness.   Assessment  & Plan :    Acute Hypoxic Resp. Failure due to CAP in a patient who is immunocompromised due to combination of Humira, prednisone, methotrexate for her underlying RA - she was initially intubated for 3 days and finally extubated on 06/14/2021, underwent diagnostic bronchoscopy, she was on combination of azithromycin and Zosyn and has finished ABX as of 06/19/21.  Currently  on nasal cannula oxygen 2 L along with supportive care with nebulizer treatments.  Steroids have been tapered off note she takes 5 mg of prednisone at baseline for her RA.  Encouraged the patient to sit up in chair in the daytime use I-S and flutter valve for pulmonary toiletry.  Will advance activity and titrate down oxygen as possible.   2.  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  Left heart cath done on 06/16/2021 shows significant proximal LAD calcification of close to 80% EF marginally improved to 30-35%, case discussed with cardiology group on 06/16/2021 and 06/17/2021, placed on dual antiplatelet therapy along with statin which was started on 06/16/2021, continue diuretics per Cards, currently chest pain-free likely for stent placement at a later date due to her recent pneumonia.  Blood pressure for now seems to be the limiting factor for diuresis, will continue to monitor defer management to cardiology.  CT repeated on 06/18/18 reviewed with Pulm. Dr Carlis Abbott, resolving PNA + CHF, Procal and CRP.   3.  RA.  Now on her chronic 5 mg of prednisone at baseline.  Methotrexate and Humira on hold.  4.  Alcohol use.  Counseled to cut down, on vitamins, no DTs will monitor.  5.  Normocytic anemia.  Monitor.  6.  Dyslipidemia.  Placed on statin.       Condition - Extremely Guarded  Family Communication  : Daughter bedside on 06/16/2021, 06/17/21, 06/18/21, 06/19/21, sister bedside 06/20/21  Code Status : Full  Consults  : PCCM, cardiology  PUD Prophylaxis : Pepcid   Procedures  :     Intubated on 06/11/2021 extubated 06/14/2021.  Echocardiogram.  1. Left ventricular ejection fraction, by estimation, is 25 to 30%. The left ventricle has severely decreased function. The left ventricle demonstrates regional wall motion abnormalities (see scoring diagram/findings for description). Left ventricular diastolic parameters are consistent with Grade I diastolic  dysfunction (impaired relaxation).  2. Right ventricular systolic function is mildly reduced. The right ventricular size is normal. There is mildly elevated pulmonary artery systolic pressure. The estimated right ventricular systolic pressure is 0000000 mmHg.  3. The mitral valve is normal in structure. Trivial mitral valve regurgitation. No evidence of mitral stenosis.  4. The aortic valve is tricuspid. Aortic valve regurgitation is mild. Aortic valve sclerosis/calcification is present, without any evidence of aortic stenosis.  5. The inferior vena cava is dilated in size with <50% respiratory variability, suggesting right atrial pressure of 15 mmHg.  Left heart cath - Dr Tamala Julian 06/16/2021  CONCLUSIONS: Calcified segmental 85% proximal LAD Dominant circumflex without significant obstruction.  Distally before the PDA there is segmental 50% narrowing. Left main is widely patent Right coronary was poorly visualized, is nondominant, and contains mid 50% stenosis. Low normal to mild systolic dysfunction with EF in the 35 to 45% range.  LVEDP 14 mmHg. Mild pulmonary hypertension with mean PA pressure 22 mmHg; mean wedge pressure 14 mmHg; right atrial mean 6 mmHg.  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   RECOMMENDATION: Consider starting dual antiplatelet therapy if felt safe.  I will recommend Plavix. Continue to treat heart failure and recuperate from pneumonia. Active orbital atherectomy and stenting of the LAD and days 2 weeks once the patient is stronger. Earlier management if the patient has ischemic symptoms. LV function is improving and is clearly better than 25 to 30% on this occasion as noted above. Hemodynamics are reasonable but would start basic guideline directed therapy for heart failure.      Disposition Plan  :    Status is: Inpatient  DVT Prophylaxis  :    enoxaparin (LOVENOX) injection 40 mg Start: 06/16/21 2200 SCDs Start: 06/12/21 0852    Lab Results  Component Value Date    PLT 389 06/20/2021    Diet :  Diet Order  Diet Heart Room service appropriate? Yes; Fluid consistency: Thin  Diet effective now                    Inpatient Medications  Scheduled Meds:  aspirin EC  81 mg Oral Daily   carvedilol  3.125 mg Oral BID WC   clopidogrel  75 mg Oral Daily   dapagliflozin propanediol  10 mg Oral Daily   digoxin  0.125 mg Oral Daily   enoxaparin (LOVENOX) injection  40 mg Subcutaneous QHS   feeding supplement  1 Container Oral BID BM   folic acid  1 mg Oral Daily   furosemide  40 mg Intravenous Once   insulin aspart  0-9 Units Subcutaneous TID WC   losartan  12.5 mg Oral Daily   mouth rinse  15 mL Mouth Rinse BID   multivitamin with minerals  1 tablet Oral Daily   predniSONE  5 mg Oral Q breakfast   rosuvastatin  10 mg Oral Daily   spironolactone  12.5 mg Oral Daily   thiamine  100 mg Oral Daily   Continuous Infusions:  sodium chloride     PRN Meds:.acetaminophen **OR** acetaminophen, acetaminophen, benzonatate, docusate, levalbuterol, ondansetron (ZOFRAN) IV, polyethylene glycol, polyvinyl alcohol, sodium chloride, sodium chloride flush  Antibiotics  :    Anti-infectives (From admission, onward)    Start     Dose/Rate Route Frequency Ordered Stop   06/12/21 1000  piperacillin-tazobactam (ZOSYN) IVPB 3.375 g        3.375 g 12.5 mL/hr over 240 Minutes Intravenous Every 8 hours 06/12/21 0947 06/19/21 1036   06/11/21 1800  cefTRIAXone (ROCEPHIN) 2 g in sodium chloride 0.9 % 100 mL IVPB  Status:  Discontinued        2 g 200 mL/hr over 30 Minutes Intravenous Every 24 hours 06/11/21 1749 06/12/21 0911   06/11/21 1800  azithromycin (ZITHROMAX) 500 mg in sodium chloride 0.9 % 250 mL IVPB        500 mg 250 mL/hr over 60 Minutes Intravenous Every 24 hours 06/11/21 1749 06/15/21 1951        Time Spent in minutes  30   Lala Lund M.D on 06/20/2021 at 8:52 AM  To page go to [email or web address]   [facility] -  Office   [phone]  See all Orders from today for further details    Objective:   Vitals:   06/19/21 2253 06/20/21 0315 06/20/21 0638 06/20/21 0753  BP: (!) 126/54 100/86 (!) 95/49 (!) 99/46  Pulse: 89 92 88 92  Resp: 20 16 18 19   Temp: 97.8 F (36.6 C) 98.2 F (36.8 C)  (!) 97.5 F (36.4 C)  TempSrc: Oral Oral    SpO2: 94% 96%  95%  Weight:      Height:        Wt Readings from Last 3 Encounters:  06/19/21 70.6 kg     Intake/Output Summary (Last 24 hours) at 06/20/2021 0852 Last data filed at 06/19/2021 2253 Gross per 24 hour  Intake 480 ml  Output 300 ml  Net 180 ml     Physical Exam  Awake Alert, No new F.N deficits, Normal affect [place].AT,PERRAL Supple Neck, No JVD,   Symmetrical Chest wall movement, Good air movement bilaterally, coarse bilateral crackles but improving RRR,No Gallops, Rubs or new Murmurs,  +ve B.Sounds, Abd Soft, No tenderness,   No Cyanosis, trace lower extremity edema    Data Review:    CBC Recent Labs  Lab 06/16/21 0208  06/16/21 1001 06/16/21 1010 06/17/21 0132 06/18/21 0059 06/19/21 0106 06/20/21 0100  WBC 11.8*  --   --  11.1* 11.6* 11.4* 11.7*  HGB 9.5*   < > 9.2* 9.3* 10.0* 9.9* 10.2*  HCT 29.3*   < > 27.0* 27.8* 31.1* 30.5* 31.8*  PLT 343  --   --  364 388 407* 389  MCV 94.5  --   --  93.3 96.0 94.4 96.4  MCH 30.6  --   --  31.2 30.9 30.7 30.9  MCHC 32.4  --   --  33.5 32.2 32.5 32.1  RDW 13.7  --   --  13.7 13.8 14.1 14.2  LYMPHSABS  --   --   --  3.8 4.1* 3.5 3.5  MONOABS  --   --   --  1.1* 1.2* 1.3* 1.4*  EOSABS  --   --   --  0.4 0.8* 1.1* 1.0*  BASOSABS  --   --   --  0.1 0.1 0.1 0.1   < > = values in this interval not displayed.    Electrolytes Recent Labs  Lab 06/14/21 1719 06/15/21 0309 06/16/21 0208 06/16/21 1001 06/16/21 1010 06/17/21 0132 06/18/21 0059 06/18/21 1019 06/19/21 0106 06/20/21 0100 06/20/21 0604  NA  --    < > 140   < > 141 138 138  --  136 137  --   K  --    < > 4.1   < > 3.3* 3.3*  3.4*  --  4.0 4.2  --   CL  --    < > 102  --   --  103 102  --  101 103  --   CO2  --    < > 30  --   --  26 26  --  26 22  --   GLUCOSE  --    < > 122*  --   --  120* 114*  --  98 90  --   BUN  --    < > 17  --   --  14 11  --  11 13  --   CREATININE  --    < > 0.63  --   --  0.69 0.82  --  0.79 0.71  --   CALCIUM  --    < > 8.3*  --   --  8.4* 8.5*  --  8.6* 8.7*  --   AST  --   --   --   --   --  55* 49*  --  37 28  --   ALT  --   --   --   --   --  40 42  --  41 32  --   ALKPHOS  --   --   --   --   --  70 64  --  62 61  --   BILITOT  --   --   --   --   --  0.5 0.3  --  0.3 0.4  --   ALBUMIN  --   --   --   --   --  2.3* 2.4*  --  2.5* 2.7*  --   MG  --    < > 2.3  --   --  2.3 2.0  --  2.2 2.3  --   CRP  --   --   --   --   --   --   --  2.1* 1.7* 1.9*  --   PROCALCITON  --   --   --   --   --   --   --  <0.10 <0.10  --  <0.10  BNP 541.6*  --   --   --   --  697.8* 437.0*  --  207.6* 171.8*  --    < > = values in this interval not displayed.    ------------------------------------------------------------------------------------------------------------------ No results for input(s): CHOL, HDL, LDLCALC, TRIG, CHOLHDL, LDLDIRECT in the last 72 hours.   Lab Results  Component Value Date   HGBA1C 5.4 06/12/2021     Radiology Reports CT CHEST WO CONTRAST  Result Date: 06/18/2021 CLINICAL DATA:  Complicated pneumonia follow-up EXAM: CT CHEST WITHOUT CONTRAST TECHNIQUE: Multidetector CT imaging of the chest was performed following the standard protocol without IV contrast. RADIATION DOSE REDUCTION: This exam was performed according to the departmental dose-optimization program which includes automated exposure control, adjustment of the mA and/or kV according to patient size and/or use of iterative reconstruction technique. COMPARISON:  CT chest 06/11/2021 FINDINGS: Cardiovascular: Heart is mildly enlarged. No pericardial effusion identified. Extensive coronary artery calcifications.  Main pulmonary artery is upper normal caliber. Thoracic aorta is normal in caliber with moderate calcified plaques. Mediastinum/Nodes: No bulky axillary or mediastinal lymphadenopathy identified. Likely prominent bilateral hilar lymph nodes, limited evaluation without contrast. Lungs/Pleura: Persistent extensive increased interstitial densities and irregular ground-glass alveolar densities throughout both lungs. The ground-glass infiltrates are decreased in size and density since previous study with overall improved aeration of the lungs. No pleural effusion. No pneumothorax. Upper Abdomen: Moderate-sized hiatal hernia. Musculoskeletal: Degenerative changes of the thoracic spine and old compression fracture in the lower thoracic spine. No suspicious bony lesions identified. IMPRESSION: 1. Improvement since previous study, however persistent extensive interstitial and irregular ground-glass infiltrates bilaterally. 2. Mild cardiomegaly. Coronary artery disease and atherosclerotic disease. 3. Moderate hiatal hernia. Aortic Atherosclerosis (ICD10-I70.0). Electronically Signed   By: Ofilia Neas M.D.   On: 06/18/2021 13:08   CARDIAC CATHETERIZATION  Addendum Date: 06/17/2021     Ost LAD to Mid LAD lesion is 85% stenosed.   Prox RCA lesion is 50% stenosed.   LPAV lesion is 50% stenosed.   There is mild to moderate left ventricular systolic dysfunction.   LV end diastolic pressure is normal.   LV end diastolic pressure is normal.   The left ventricular ejection fraction is 35-45% by visual estimate.   Hemodynamic findings consistent with mild pulmonary hypertension. CONCLUSIONS: Calcified segmental 85% proximal LAD Dominant circumflex without significant obstruction.  Distally before the PDA there is segmental 50% narrowing. Left main is widely patent Right coronary was poorly visualized, is nondominant, and contains mid 50% stenosis. Low normal to mild systolic dysfunction with EF in the 35 to 45% range.  LVEDP  14 mmHg. Mild pulmonary hypertension with mean PA pressure 22 mmHg; mean wedge pressure 14 mmHg; right atrial mean 6 mmHg.  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 RECOMMENDATION: Consider starting dual antiplatelet therapy if felt safe.  Recommend Plavix 75 mg/day. Continue to treat heart failure and recuperation from pneumonia. Orbital atherectomy and stenting of the LAD in days to weeks once the patient is stronger. Earlier percutaneous management if the patient has ischemic symptoms. LV function is improving and is clearly better than 25 to 30% as noted above. Hemodynamics are reasonable and would start basic guideline directed therapy for heart failure.  Addendum Date: 06/16/2021     Ost LAD to Mid LAD lesion is 85%  stenosed.   Prox RCA lesion is 50% stenosed.   LPAV lesion is 50% stenosed.   There is mild to moderate left ventricular systolic dysfunction.   LV end diastolic pressure is normal.   LV end diastolic pressure is normal.   The left ventricular ejection fraction is 35-45% by visual estimate.   Hemodynamic findings consistent with mild pulmonary hypertension. CONCLUSIONS: Calcified segmental 85% proximal LAD Dominant circumflex without significant obstruction.  Distally before the PDA there is segmental 50% narrowing. Left main is widely patent Right coronary was poorly visualized, is nondominant, and contains mid 50% stenosis. Low normal to mild systolic dysfunction with EF in the 35 to 45% range.  LVEDP 14 mmHg. Mild pulmonary hypertension with mean PA pressure 22 mmHg; mean wedge pressure 14 mmHg; right atrial mean 6 mmHg.  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 RECOMMENDATION: Consider starting dual antiplatelet therapy if felt safe.  I will recommend Plavix. Continue to treat heart failure and recuperate from pneumonia. Active orbital atherectomy and stenting of the LAD and days 2 weeks once the patient is stronger. Earlier management if the patient has ischemic symptoms. LV function is improving and  is clearly better than 25 to 30% on this occasion as noted above. Hemodynamics are reasonable but would start basic guideline directed therapy for heart failure.   Result Date: 06/17/2021   Ost LAD to Mid LAD lesion is 85% stenosed.   Prox RCA lesion is 50% stenosed.   LPAV lesion is 50% stenosed.   There is mild to moderate left ventricular systolic dysfunction.   LV end diastolic pressure is normal.   LV end diastolic pressure is normal.   The left ventricular ejection fraction is 35-45% by visual estimate.   Hemodynamic findings consistent with mild pulmonary hypertension. CONCLUSIONS: Calcified segmental 85% proximal LAD Dominant circumflex without significant obstruction.  Distally before the PDA there is segmental 50% narrowing. Left main is widely patent Right coronary was poorly visualized, is nondominant, and contains mid 50% stenosis. Low normal to mild systolic dysfunction with EF in the 35 to 45% range.  LVEDP 14 mmHg. Mild pulmonary hypertension with mean PA pressure 22 mmHg; mean wedge pressure 14 mmHg; right atrial mean 6 mmHg.  Pulmonary hypertension group 2. RECOMMENDATION: Consider starting dual antiplatelet therapy if felt safe.  I will recommend Plavix. Continue to treat heart failure and recuperate from pneumonia. Active orbital atherectomy and stenting of the LAD and days 2 weeks once the patient is stronger. Earlier management if the patient has ischemic symptoms. LV function is improving and is clearly better than 25 to 30% on this occasion as noted above. Hemodynamics are reasonable but would start basic guideline directed therapy for heart failure.  DG Chest Port 1 View  Result Date: 06/18/2021 CLINICAL DATA:  Cough, shortness of breath and chest pain. EXAM: PORTABLE CHEST 1 VIEW COMPARISON:  06/17/2021 FINDINGS: Stable cardiomediastinal contours. Lung volumes are low. Bilateral interstitial and airspace densities are again noted. These are not significantly changed when compared with  the previous exam. No new findings. IMPRESSION: No change in bilateral interstitial and patchy airspace opacities. Electronically Signed   By: Signa Kell M.D.   On: 06/18/2021 08:59   DG Chest Port 1 View  Result Date: 06/17/2021 CLINICAL DATA:  Shortness of breath. EXAM: PORTABLE CHEST 1 VIEW COMPARISON:  06/12/2021 FINDINGS: Heart size is normal. Bilateral interstitial and airspace opacities are again noted and are unchanged compared with the previous exam. Lung volumes remain low. No pleural effusion identified.  IMPRESSION: No change in aeration to the lungs compared with previous exam. Electronically Signed   By: Kerby Moors M.D.   On: 06/17/2021 07:11

## 2021-06-20 NOTE — Progress Notes (Signed)
Occupational Therapy Treatment Patient Details Name: Veronica Romero MRN: [id number] DOB: 1950-09-25 Today's Date: 06/20/2021   History of present illness Pt is a 71 y/o female admitted due to multifocal PNA. Due to worsening respiration status, pt intubated 2/13. Extubated 2/15. ALso with new onset CHF. s/p LHC 06/16/21 with severe LAD stenosis. PMH: recent COVID-19 infection, RA.   OT comments  Pt progressed with 4 L 02 required to sustain 91-94% during session. Pt dropping to 83% on 2L using rollator. Pt expressing some concerns with d/c home with spouse at night. Pt expressing concerns for oxygen requirements in the home. Pt has sister visiting from [facility] helping and daughter/ grandchildren locally helping. Pt has hired caregivers 9am-1pm during the day every day. Pt is responsible for all other care outside of these times. Recommendation for [facility]   Recommendations for follow up therapy are one component of a multi-disciplinary discharge planning process, led by the attending physician.  Recommendations may be updated based on patient status, additional functional criteria and insurance authorization.    Follow Up Recommendations  Home health OT    Assistance Recommended at Discharge Intermittent Supervision/Assistance  Patient can return home with the following  A little help with walking and/or transfers;A little help with bathing/dressing/bathroom;Assistance with cooking/housework;Assist for transportation;Help with stairs or ramp for entrance   Equipment Recommendations  BSC/3in1;Other (comment) (oxygen)    Recommendations for Other Services      Precautions / Restrictions Precautions Precautions: Fall;Other (comment) Precaution Comments: watch 02 Restrictions Weight Bearing Restrictions: No       Mobility Bed Mobility               General bed mobility comments: oob on arrival    Transfers Overall transfer level: Needs assistance Equipment used: Rollator (4  wheels) Transfers: Sit to/from Stand Sit to Stand: Min guard           General transfer comment: pt requires 1 rest break during transfers     Balance                                           ADL either performed or assessed with clinical judgement   ADL Overall ADL's : Needs assistance/impaired Eating/Feeding: Modified independent                       Toilet Transfer: Min guard;Ambulation;Regular Social worker and Hygiene: Min guard;Sit to/from stand       Functional mobility during ADLs: Min guard;Rollator (4 wheels) General ADL Comments: pt requires 4L 02 during transfer due to decreased 02 into the 80s on 2L pt able to sustain 91-95% on 4L. pt educated on self regulation and fall risk during transfer. pt provided energy conservation handout and reviewed. Sister present for entire session    Extremity/Trunk Assessment Upper Extremity Assessment RUE Deficits / Details: hx of RA LUE Deficits / Details: hx of RA            Vision       Perception     Praxis      Cognition Arousal/Alertness: Awake/alert Behavior During Therapy: WFL for tasks assessed/performed Overall Cognitive Status: Within Functional Limits for tasks assessed  General Comments: asking questions about home discharge that are appropriate and concerns for spouse and assisting him at night        Exercises      Shoulder Instructions       General Comments 4L 02 with transfers and movement. Pt sitting able to sustain 2L 02. Pt requires pursed lip breathing with 2L with basic transfer to rebound    Pertinent Vitals/ Pain       Pain Assessment Pain Assessment: No/denies pain  Home Living                                          Prior Functioning/Environment              Frequency  Min 2X/week        Progress Toward Goals  OT Goals(current goals can  now be found in the care plan section)  Progress towards OT goals: Progressing toward goals  Acute Rehab OT Goals Patient Stated Goal: to be able to get off this oxygen OT Goal Formulation: With patient Time For Goal Achievement: 06/29/21 Potential to Achieve Goals: Good ADL Goals Pt Will Perform Lower Body Bathing: with modified independence;sit to/from stand Pt Will Transfer to Toilet: with modified independence;ambulating Pt/caregiver will Perform Home Exercise Program: Increased strength;Both right and left upper extremity;With theraband;Independently;With written HEP provided Additional ADL Goal #1: Pt to independently monitor SpO2 and implement pursed lip breathing as appropriate Additional ADL Goal #2: Pt to increase activity tolerance > 7 min with stable vitals during ADLs/IADLs Additional ADL Goal #3: Pt to verbalize at least 3 energy conservation strategies to implement during ADLs/IADLs  Plan Discharge plan remains appropriate    Co-evaluation                 AM-PAC OT "6 Clicks" Daily Activity     Outcome Measure   Help from another person eating meals?: A Little Help from another person taking care of personal grooming?: A Little Help from another person toileting, which includes using toliet, bedpan, or urinal?: A Little Help from another person bathing (including washing, rinsing, drying)?: A Little Help from another person to put on and taking off regular upper body clothing?: A Little Help from another person to put on and taking off regular lower body clothing?: A Little 6 Click Score: 18    End of Session Equipment Utilized During Treatment: Rollator (4 wheels);Oxygen  OT Visit Diagnosis: Muscle weakness (generalized) (M62.81);Other abnormalities of gait and mobility (R26.89);Unsteadiness on feet (R26.81)   Activity Tolerance Patient tolerated treatment well   Patient Left in chair;with call bell/phone within reach;with family/visitor present   Nurse  Communication Mobility status;Precautions        Time: [phone] OT Time Calculation (min): 27 min  Charges: OT General Charges $OT Visit: 1 Visit OT Treatments $Self Care/Home Management : 23-37 mins   Brynn, OTR/L  Acute Rehabilitation Services Pager: [phone] Office: [phone] .   Mateo Flow 06/20/2021, 10:20 AM

## 2021-06-21 ENCOUNTER — Inpatient Hospital Stay (HOSPITAL_COMMUNITY): Payer: Medicare Other

## 2021-06-21 DIAGNOSIS — Z789 Other specified health status: Secondary | ICD-10-CM

## 2021-06-21 DIAGNOSIS — I214 Non-ST elevation (NSTEMI) myocardial infarction: Secondary | ICD-10-CM | POA: Diagnosis not present

## 2021-06-21 DIAGNOSIS — F1090 Alcohol use, unspecified, uncomplicated: Secondary | ICD-10-CM

## 2021-06-21 DIAGNOSIS — J9601 Acute respiratory failure with hypoxia: Secondary | ICD-10-CM | POA: Diagnosis not present

## 2021-06-21 DIAGNOSIS — I5021 Acute systolic (congestive) heart failure: Secondary | ICD-10-CM | POA: Diagnosis not present

## 2021-06-21 LAB — GLUCOSE, CAPILLARY
Glucose-Capillary: 87 mg/dL (ref 70–99)
Glucose-Capillary: 93 mg/dL (ref 70–99)
Glucose-Capillary: 115 mg/dL — ABNORMAL HIGH (ref 70–99)
Glucose-Capillary: 99 mg/dL (ref 70–99)

## 2021-06-21 LAB — CBC WITH DIFFERENTIAL/PLATELET
Abs Immature Granulocytes: 0.1 10*3/uL — ABNORMAL HIGH (ref 0.00–0.07)
Basophils Absolute: 0.1 10*3/uL (ref 0.0–0.1)
Basophils Relative: 1 %
Eosinophils Absolute: 0.7 10*3/uL — ABNORMAL HIGH (ref 0.0–0.5)
Eosinophils Relative: 7 %
HCT: 32.5 % — ABNORMAL LOW (ref 36.0–46.0)
Hemoglobin: 10.9 g/dL — ABNORMAL LOW (ref 12.0–15.0)
Immature Granulocytes: 1 %
Lymphocytes Relative: 34 %
Lymphs Abs: 3.6 10*3/uL (ref 0.7–4.0)
MCH: 31.1 pg (ref 26.0–34.0)
MCHC: 33.5 g/dL (ref 30.0–36.0)
MCV: 92.6 fL (ref 80.0–100.0)
Monocytes Absolute: 1.3 10*3/uL — ABNORMAL HIGH (ref 0.1–1.0)
Monocytes Relative: 12 %
Neutro Abs: 4.7 10*3/uL (ref 1.7–7.7)
Neutrophils Relative %: 45 %
Platelets: 417 10*3/uL — ABNORMAL HIGH (ref 150–400)
RBC: 3.51 MIL/uL — ABNORMAL LOW (ref 3.87–5.11)
RDW: 14.2 % (ref 11.5–15.5)
WBC: 10.5 10*3/uL (ref 4.0–10.5)
nRBC: 0 % (ref 0.0–0.2)

## 2021-06-21 LAB — BRAIN NATRIURETIC PEPTIDE: B Natriuretic Peptide: 134.5 pg/mL — ABNORMAL HIGH (ref 0.0–100.0)

## 2021-06-21 LAB — BASIC METABOLIC PANEL WITH GFR
Anion gap: 11 (ref 5–15)
BUN: 14 mg/dL (ref 8–23)
CO2: 26 mmol/L (ref 22–32)
Calcium: 8.9 mg/dL (ref 8.9–10.3)
Chloride: 98 mmol/L (ref 98–111)
Creatinine, Ser: 0.78 mg/dL (ref 0.44–1.00)
GFR, Estimated: >60 mL/min
Glucose, Bld: 107 mg/dL — ABNORMAL HIGH (ref 70–99)
Potassium: 3.9 mmol/L (ref 3.5–5.1)
Sodium: 135 mmol/L (ref 135–145)

## 2021-06-21 LAB — MAGNESIUM: Magnesium: 2.4 mg/dL (ref 1.7–2.4)

## 2021-06-21 LAB — PROCALCITONIN: Procalcitonin: <0.1 ng/mL

## 2021-06-21 LAB — C-REACTIVE PROTEIN: CRP: 2.3 mg/dL — ABNORMAL HIGH (ref <1.0)

## 2021-06-21 MED ORDER — FUROSEMIDE 40 MG PO TABS
40.0000 mg | ORAL_TABLET | Freq: Every day | ORAL | Status: DC
Start: 2021-06-21 — End: 2021-06-22
  Administered 2021-06-21 – 2021-06-22 (×2): 40 mg via ORAL
  Filled 2021-06-21 (×3): qty 1

## 2021-06-21 NOTE — Progress Notes (Signed)
Cardiology Progress Note  Patient ID: Veronica Romero MRN: [id number] DOB: Aug 21, 1950 Date of Encounter: 06/21/2021  Primary Cardiologist: Berniece Salines, DO  Subjective   Chief Complaint: Shortness of breath  HPI: Satting well on RA. Pending ambulation. Still working a bit to breath with crackles. We discussed cath likely after she leaves the hospital. She remains chest pain free   ROS:  All other ROS reviewed and negative. Pertinent positives noted in the HPI.     Inpatient Medications  Scheduled Meds:  aspirin EC  81 mg Oral Daily   carvedilol  3.125 mg Oral BID WC   clopidogrel  75 mg Oral Daily   dapagliflozin propanediol  10 mg Oral Daily   digoxin  0.125 mg Oral Daily   enoxaparin (LOVENOX) injection  40 mg Subcutaneous QHS   feeding supplement  1 Container Oral BID BM   folic acid  1 mg Oral Daily   insulin aspart  0-9 Units Subcutaneous TID WC   losartan  12.5 mg Oral Daily   mouth rinse  15 mL Mouth Rinse BID   multivitamin with minerals  1 tablet Oral Daily   predniSONE  5 mg Oral Q breakfast   rosuvastatin  20 mg Oral Daily   spironolactone  12.5 mg Oral Daily   thiamine  100 mg Oral Daily   Continuous Infusions:  sodium chloride     PRN Meds: acetaminophen **OR** acetaminophen, benzonatate, docusate, levalbuterol, menthol-cetylpyridinium, ondansetron (ZOFRAN) IV, polyethylene glycol, polyvinyl alcohol, sodium chloride, sodium chloride flush   Vital Signs   Vitals:   06/20/21 1923 06/20/21 2333 06/21/21 0356 06/21/21 0506  BP: 100/78 102/89 101/75   Pulse: 87 78 (!) 102   Resp: 20 17 20    Temp: 98.2 F (36.8 C) 98.4 F (36.9 C) 97.9 F (36.6 C)   TempSrc: Oral Oral Oral   SpO2: 96% 98% 96%   Weight:    68.7 kg  Height:        Intake/Output Summary (Last 24 hours) at 06/21/2021 0905 Last data filed at 06/21/2021 0330 Gross per 24 hour  Intake 240 ml  Output 1700 ml  Net -1460 ml   Last 3 Weights 06/21/2021 06/19/2021 06/18/2021  Weight (lbs) 151  lb 7.3 oz 155 lb 10.3 oz 144 lb 9.6 oz  Weight (kg) 68.7 kg 70.6 kg 65.59 kg      Telemetry  Sinus rhythm  Physical Exam   Vitals:   06/20/21 1923 06/20/21 2333 06/21/21 0356 06/21/21 0506  BP: 100/78 102/89 101/75   Pulse: 87 78 (!) 102   Resp: 20 17 20    Temp: 98.2 F (36.8 C) 98.4 F (36.9 C) 97.9 F (36.6 C)   TempSrc: Oral Oral Oral   SpO2: 96% 98% 96%   Weight:    68.7 kg  Height:        Intake/Output Summary (Last 24 hours) at 06/21/2021 0905 Last data filed at 06/21/2021 0330 Gross per 24 hour  Intake 240 ml  Output 1700 ml  Net -1460 ml    Last 3 Weights 06/21/2021 06/19/2021 06/18/2021  Weight (lbs) 151 lb 7.3 oz 155 lb 10.3 oz 144 lb 9.6 oz  Weight (kg) 68.7 kg 70.6 kg 65.59 kg    Body mass index is 24.45 kg/m.   Physical Exam  Gen: still mild wob, otherwise no distress Neuro: alert and oriented CV: r,r,r no murmurs.  Pulm: satting on RA 90s, mild wob,  decreased BS with inspiratory crackles Abd: non distended Ext:  No LE edema Skin: warm and well perfused Psych: normal mood   Labs  High Sensitivity Troponin:   Recent Labs  Lab 06/11/21 1645 06/11/21 2229 06/12/21 1115 06/12/21 1627 06/13/21 0553  TROPONINIHS 25* 19* 768* 3,044* 1,806*     Cardiac EnzymesNo results for input(s): TROPONINI in the last 168 hours. No results for input(s): TROPIPOC in the last 168 hours.  Chemistry Recent Labs  Lab 06/18/21 0059 06/19/21 0106 06/20/21 0100 06/21/21 0059  NA 138 136 137 135  K 3.4* 4.0 4.2 3.9  CL 102 101 103 98  CO2 26 26 22 26   GLUCOSE 114* 98 90 107*  BUN 11 11 13 14   CREATININE 0.82 0.79 0.71 0.78  CALCIUM 8.5* 8.6* 8.7* 8.9  PROT 6.4* 6.5 6.7  --   ALBUMIN 2.4* 2.5* 2.7*  --   AST 49* 37 28  --   ALT 42 41 32  --   ALKPHOS 64 62 61  --   BILITOT 0.3 0.3 0.4  --   GFRNONAA >60 >60 >60 >60  ANIONGAP 10 9 12 11     Hematology Recent Labs  Lab 06/19/21 0106 06/20/21 0100 06/21/21 0059  WBC 11.4* 11.7* 10.5  RBC 3.23* 3.30*  3.51*  HGB 9.9* 10.2* 10.9*  HCT 30.5* 31.8* 32.5*  MCV 94.4 96.4 92.6  MCH 30.7 30.9 31.1  MCHC 32.5 32.1 33.5  RDW 14.1 14.2 14.2  PLT 407* 389 417*   BNP Recent Labs  Lab 06/19/21 0106 06/20/21 0100 06/21/21 0059  BNP 207.6* 171.8* 134.5*    DDimer No results for input(s): DDIMER in the last 168 hours.   Radiology  DG CHEST PORT 1 VIEW  Result Date: 06/21/2021 CLINICAL DATA:  Shortness of breath. EXAM: PORTABLE CHEST 1 VIEW COMPARISON:  06/18/2021 and CT chest 06/18/2021. FINDINGS: Trachea is midline. Heart is enlarged, stable. Peripheral and basilar predominant coarsened pulmonary markings with superimposed mid and lower lung zone predominant airspace opacities. Lungs are low in volume. No pleural fluid. IMPRESSION: Low lung volumes with persistent airspace opacities in the mid and lower lung zones, which may be due to edema or pneumonia, superimposed on chronic interstitial lung disease. Electronically Signed   By: Lorin Picket M.D.   On: 06/21/2021 08:11    Cardiac Studies  TTE 06/12/2021  1. Left ventricular ejection fraction, by estimation, is 25 to 30%. The  left ventricle has severely decreased function. The left ventricle  demonstrates regional wall motion abnormalities (see scoring  diagram/findings for description). Left ventricular  diastolic parameters are consistent with Grade I diastolic dysfunction  (impaired relaxation).   2. Right ventricular systolic function is mildly reduced. The right  ventricular size is normal. There is mildly elevated pulmonary artery  systolic pressure. The estimated right ventricular systolic pressure is  0000000 mmHg.   3. The mitral valve is normal in structure. Trivial mitral valve  regurgitation. No evidence of mitral stenosis.   4. The aortic valve is tricuspid. Aortic valve regurgitation is mild.  Aortic valve sclerosis/calcification is present, without any evidence of  aortic stenosis.   5. The inferior vena cava is dilated  in size with <50% respiratory  variability, suggesting right atrial pressure of 15 mmHg.   LHC/RHC 06/16/2021   Ost LAD to Mid LAD lesion is 85% stenosed.   Prox RCA lesion is 50% stenosed.   LPAV lesion is 50% stenosed.   There is mild to moderate left ventricular systolic dysfunction.   LV end diastolic pressure is normal.  LV end diastolic pressure is normal.   The left ventricular ejection fraction is 35-45% by visual estimate.   Hemodynamic findings consistent with mild pulmonary hypertension.  Cardiac Index 4.1 -normal   CONCLUSIONS: Calcified segmental 85% proximal LAD Dominant circumflex without significant obstruction.  Distally before the PDA there is segmental 50% narrowing. Left main is widely patent Right coronary was poorly visualized, is nondominant, and contains mid 50% stenosis. Low normal to mild systolic dysfunction with EF in the 35 to 45% range.  LVEDP 14 mmHg. Mild pulmonary hypertension with mean PA pressure 22 mmHg; mean wedge pressure 14 mmHg; right atrial mean 6 mmHg.  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  Patient Profile  Veronica Romero is a 71 y.o. female with rheumatoid arthritis who was admitted on 06/14/2021 with acute hypoxic respiratory failure secondary to pneumonia in the setting of immunosuppression.  Found to have new onset systolic heart failure.  Left heart catheterization with severe LAD stenosis medically managed 2/2 PNA  Assessment & Plan   #NSTEMI: cath 85% prox LAD lesion; stable. Anterior wall from base to apex is akinetic -Admitted with pneumonia.  Found to have secondary myocardial infarction.  Left heart catheterization with severe proximal LAD stenosis.  Plan for medical management with staged intervention when she is recovered from pneumonia; may take a little while. If she is planned to DC on O2, will plan for LHC as an outpatient and arrange for follow-up -Continue aspirin 81 mg daily and Plavix 75 mg daily  -On statin -EF 25-30%.   Treating as below.  #Acute systolic heart failure, EF 25-30%; presentation complicated by PNA RV function is mildly reduced. 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 - euvolemic, will transition her to oral lasix 40 mg daily today -cont Coreg 3.125 mg twice daily (if Bps are soft <90s, can hold this) -Digoxin 0.125 mg daily was added over the weekend to assist with blood pressure and tolerating GDMT.  Can continue this   - continue  Losartan 12.5 mg daily was added.  Transition to [facility] as you are able.  Given soft BP do not suspect she will tolerate this.   -Continue Aldactone 12.5 mg daily.  Will follow peripherally, please reach out when she is closer to discharge and can plan for LHC. At this point, will likely schedule as an outpatient.  For questions or updates, please contact [place] Please consult [email or web address] for contact info under     Janina Mayo, MD

## 2021-06-21 NOTE — Assessment & Plan Note (Signed)
Counseled, continue thiamine, no withdrawal noted

## 2021-06-21 NOTE — Progress Notes (Signed)
PROGRESS NOTE    KYNZLEE HUCKER  TMA:[id number] DOB: 1951/03/26 DOA: 06/11/2021 PCP: Pcp, No  Brief Narrative: 70/F with history of rheumatoid arthritis on methotrexate, prednisone, Humira, recent COVID-19 infection was admitted to the hospital with acute hypoxic respiratory failure, community-acquired pneumonia and acute CHF she was intubated, admitted to the ICU, underwent diagnostic bronc which was unrevealing, extubated 2/15.  She was also found to have new onset heart failure, cardiology consulted, left heart cath noted severe LAD stenosis, medically managed in the setting of pneumonia, plan for PCI/stenting when more stable   Subjective: -Feels tired, some cough, breathing overall improving   Assessment and Plan: * Acute respiratory failure with hypoxia and hypercapnia (HCC)- (present on admission) - Secondary to multifocal pneumonia and acute CHF -Immunocompromised from RA meds -Extubated 2/15, underwent diagnostic bronchoscopy, BAL cultures, AFB smear are negative, fungal cultures pending needs follow-up -Completed ceftriaxone, Zosyn on 2/20 -Was on IV steroids which have been tapered off back to her baseline dose of prednisone 5 Mg daily -Increase activity, repeat chest x-ray, incentive spirometry -Discharge planning  Acute on chronic systolic heart failure (HCC) Acute systolic CHF, EF 25-63% -See discussion above -Diuresed with IV Lasix, -3.6 L, still appears somewhat wet on exam,  complicated by pneumonia, will repeat chest x-ray -Also started on losartan, Aldactone, digoxin, low-dose Coreg  CAD (coronary artery disease), native coronary artery NSTEMI -Followed by cardiology this admission, echo noted EF of 25-30%, anterior wall from base to apex was akinetic, hospital course complicated by pneumonia, underwent diagnostic cath which showed severe proximal LAD stenosis -Per cardiology plan for medical management with staged intervention when she has recovered from  pneumonia -Continue aspirin, Plavix, carvedilol, statin  Rheumatoid arthritis ([place]) Back on prednisone 5 Mg daily-Home dose, methotrexate and Humira on hold  Alcohol use Counseled, continue thiamine, no withdrawal noted   DVT prophylaxis: Lovenox Code Status: Full code Family Communication: No family at bedside Disposition Plan: Home with home health services 1 to 2 days  Consultants:  Cardiology  Procedures:   Antimicrobials:    Objective: Vitals:   06/20/21 1923 06/20/21 2333 06/21/21 0356 06/21/21 0506  BP: 100/78 102/89 101/75   Pulse: 87 78 (!) 102   Resp: _0 Temp: 98.2 F (36.8 C) 98.4 F (36.9 C) 97.9 F (36.6 C)   TempSrc: Oral Oral Oral   SpO2: 96% 98% 96%   Weight:    68.7 kg  Height:        Intake/Output Summary (Last 24 hours) at 06/21/2021 0934 Last data filed at 06/21/2021 0330 Gross per 24 hour  Intake 240 ml  Output 1700 ml  Net -1460 ml   Filed Weights   06/18/21 0500 06/19/21 0625 06/21/21 0506  Weight: 65.6 kg 70.6 kg 68.7 kg    Examination:  General exam: Pleasant elderly female sitting up in bed, AAOx3, no distress HEENT: Positive JVD CVS: S1-S2, regular rhythm Lungs: Bilateral lower lobe Rales and rhonchi Abdomen: Soft, nontender, bowel sounds present Extremities: No edema Skin: No rashes on exposed skin  Skin: No rashes Psychiatry:  Mood & affect appropriate.     Data Reviewed:   CBC: Recent Labs  Lab 06/17/21 0132 06/18/21 0059 06/19/21 0106 06/20/21 0100 06/21/21 0059  WBC 11.1* 11.6* 11.4* 11.7* 10.5  NEUTROABS 5.7 5.4 5.3 5.6 4.7  HGB 9.3* 10.0* 9.9* 10.2* 10.9*  HCT 27.8* 31.1* 30.5* 31.8* 32.5*  MCV 93.3 96.0 94.4 96.4 92.6  PLT 364 388 407* 389 417*  Basic Metabolic Panel: Recent Labs  Lab 06/16/21 0208 06/16/21 1001 06/17/21 0132 06/18/21 0059 06/19/21 0106 06/20/21 0100 06/21/21 0059  NA 140   < > 138 138 136 137 135  K 4.1   < > 3.3* 3.4* 4.0 4.2 3.9  CL 102  --  103 102 101 103 98   CO2 30  --  _0 GLUCOSE 122*  --  120* 114* 98 90 107*  BUN 17  --  _1 CREATININE 0.63  --  0.69 0.82 0.79 0.71 0.78  CALCIUM 8.3*  --  8.4* 8.5* 8.6* 8.7* 8.9  MG 2.3  --  2.3 2.0 2.2 2.3 2.4  PHOS 3.1  --   --   --   --   --   --    < > = values in this interval not displayed.   GFR: Estimated Creatinine Clearance: 59.5 mL/min (by C-G formula based on SCr of 0.78 mg/dL). Liver Function Tests: Recent Labs  Lab 06/17/21 0132 06/18/21 0059 06/19/21 0106 06/20/21 0100  AST 55* 49* 37 28  ALT 40 42 41 32  ALKPHOS 70 64 62 61  BILITOT 0.5 0.3 0.3 0.4  PROT 6.2* 6.4* 6.5 6.7  ALBUMIN 2.3* 2.4* 2.5* 2.7*   No results for input(s): LIPASE, AMYLASE in the last 168 hours. No results for input(s): AMMONIA in the last 168 hours. Coagulation Profile: No results for input(s): INR, PROTIME in the last 168 hours. Cardiac Enzymes: Recent Labs  Lab 06/14/21 1719  CKTOTAL 67  CKMB 8.6*   BNP (last 3 results) No results for input(s): PROBNP in the last 8760 hours. HbA1C: No results for input(s): HGBA1C in the last 72 hours. CBG: Recent Labs  Lab 06/20/21 0644 06/20/21 1112 06/20/21 1615 06/20/21 2110 06/21/21 0602  GLUCAP 93 124* 107* 116* 87   Lipid Profile: No results for input(s): CHOL, HDL, LDLCALC, TRIG, CHOLHDL, LDLDIRECT in the last 72 hours. Thyroid Function Tests: No results for input(s): TSH, T4TOTAL, FREET4, T3FREE, THYROIDAB in the last 72 hours. Anemia Panel: No results for input(s): VITAMINB12, FOLATE, FERRITIN, TIBC, IRON, RETICCTPCT in the last 72 hours. Urine analysis:    Component Value Date/Time   COLORURINE AMBER (A) 06/12/2021 1244   APPEARANCEUR CLOUDY (A) 06/12/2021 1244   LABSPEC 1.020 06/12/2021 1244   PHURINE 5.0 06/12/2021 1244   GLUCOSEU 50 (A) 06/12/2021 1244   HGBUR NEGATIVE 06/12/2021 [place] 06/12/2021 1244   KETONESUR 20 (A) 06/12/2021 1244   PROTEINUR 30 (A) 06/12/2021 1244   NITRITE  NEGATIVE 06/12/2021 1244   LEUKOCYTESUR NEGATIVE 06/12/2021 1244   Sepsis Labs: _2 (procalcitonin:4,lacticidven:4)  ) Recent Results (from the past 240 hour(s))  Blood Culture (routine x 2)     Status: None   Collection Time: 06/11/21  6:00 PM   Specimen: BLOOD RIGHT FOREARM  Result Value Ref Range Status   Specimen Description BLOOD RIGHT FOREARM  Final   Special Requests   Final    BOTTLES DRAWN AEROBIC AND ANAEROBIC Blood Culture adequate volume   Culture   Final    NO GROWTH 5 DAYS Performed at [facility], [place] [address], [place], [place] [address]    Report Status 06/16/2021 FINAL  Final  Blood Culture (routine x 2)     Status: None   Collection Time: 06/11/21  6:03 PM   Specimen: BLOOD LEFT HAND  Result Value Ref Range Status   Specimen Description  BLOOD LEFT HAND  Final   Special Requests   Final    BOTTLES DRAWN AEROBIC AND ANAEROBIC Blood Culture adequate volume   Culture   Final    NO GROWTH 5 DAYS Performed at [facility], [address] [place], [place] [address]    Report Status 06/16/2021 FINAL  Final  Resp Panel by RT-PCR (Flu A&B, Covid) Nasopharyngeal Swab     Status: None   Collection Time: 06/11/21  6:13 PM   Specimen: Nasopharyngeal Swab; Nasopharyngeal(NP) swabs in vial transport medium  Result Value Ref Range Status   SARS Coronavirus 2 by RT PCR NEGATIVE NEGATIVE Final    Comment: (NOTE) SARS-CoV-2 target nucleic acids are NOT DETECTED.  The SARS-CoV-2 RNA is generally detectable in upper respiratory specimens during the acute phase of infection. The lowest concentration of SARS-CoV-2 viral copies this assay can detect is 138 copies/mL. A negative result does not preclude SARS-Cov-2 infection and should not be used as the sole basis for treatment or other patient management decisions. A negative result may occur with  improper specimen collection/handling, submission of specimen other than nasopharyngeal swab, presence of  viral mutation(s) within the areas targeted by this assay, and inadequate number of viral copies(<138 copies/mL). A negative result must be combined with clinical observations, patient history, and epidemiological information. The expected result is Negative.  Fact Sheet for Patients:  EntrepreneurPulse.com.au  Fact Sheet for Healthcare Providers:  IncredibleEmployment.be  This test is no t yet approved or cleared by the [place] FDA and  has been authorized for detection and/or diagnosis of SARS-CoV-2 by FDA under an Emergency Use Authorization (EUA). This EUA will remain  in effect (meaning this test can be used) for the duration of the COVID-19 declaration under Section 564(b)(1) of the Act, 21 U.S.C.section 360bbb-3(b)(1), unless the authorization is terminated  or revoked sooner.       Influenza A by PCR NEGATIVE NEGATIVE Final   Influenza B by PCR NEGATIVE NEGATIVE Final    Comment: (NOTE) The Xpert Xpress SARS-CoV-2/FLU/RSV plus assay is intended as an aid in the diagnosis of influenza from Nasopharyngeal swab specimens and should not be used as a sole basis for treatment. Nasal washings and aspirates are unacceptable for Xpert Xpress SARS-CoV-2/FLU/RSV testing.  Fact Sheet for Patients: EntrepreneurPulse.com.au  Fact Sheet for Healthcare Providers: IncredibleEmployment.be  This test is not yet approved or cleared by the [place] FDA and has been authorized for detection and/or diagnosis of SARS-CoV-2 by FDA under an Emergency Use Authorization (EUA). This EUA will remain in effect (meaning this test can be used) for the duration of the COVID-19 declaration under Section 564(b)(1) of the Act, 21 U.S.C. section 360bbb-3(b)(1), unless the authorization is terminated or revoked.  Performed at [facility], [place] [address], [place],  [address]   Respiratory (~20 pathogens)  panel by PCR     Status: None   Collection Time: 06/11/21  6:13 PM   Specimen: Bronchoalveolar Lavage; Respiratory  Result Value Ref Range Status   Adenovirus NOT DETECTED NOT DETECTED Final   Coronavirus 229E NOT DETECTED NOT DETECTED Final    Comment: (NOTE) The Coronavirus on the Respiratory Panel, DOES NOT test for the novel  Coronavirus (2019 nCoV)    Coronavirus HKU1 NOT DETECTED NOT DETECTED Final   Coronavirus NL63 NOT DETECTED NOT DETECTED Final   Coronavirus OC43 NOT DETECTED NOT DETECTED Final   Metapneumovirus NOT DETECTED NOT DETECTED Final   Rhinovirus / Enterovirus NOT DETECTED  NOT DETECTED Final   Influenza A NOT DETECTED NOT DETECTED Final   Influenza B NOT DETECTED NOT DETECTED Final   Parainfluenza Virus 1 NOT DETECTED NOT DETECTED Final   Parainfluenza Virus 2 NOT DETECTED NOT DETECTED Final   Parainfluenza Virus 3 NOT DETECTED NOT DETECTED Final   Parainfluenza Virus 4 NOT DETECTED NOT DETECTED Final   Respiratory Syncytial Virus NOT DETECTED NOT DETECTED Final   Bordetella pertussis NOT DETECTED NOT DETECTED Final   Bordetella Parapertussis NOT DETECTED NOT DETECTED Final   Chlamydophila pneumoniae NOT DETECTED NOT DETECTED Final   Mycoplasma pneumoniae NOT DETECTED NOT DETECTED Final    Comment: Performed at [facility], [place] [address], [place], [place] [address]  MRSA Next Gen by PCR, Nasal     Status: None   Collection Time: 06/12/21  9:10 AM   Specimen: Nasal Mucosa; Nasal Swab  Result Value Ref Range Status   MRSA by PCR Next Gen NOT DETECTED NOT DETECTED Final    Comment: (NOTE) The GeneXpert MRSA Assay (FDA approved for NASAL specimens only), is one component of a comprehensive MRSA colonization surveillance program. It is not intended to diagnose MRSA infection nor to guide or monitor treatment for MRSA infections. Test performance is not FDA approved in patients less than 64 years old. Performed at [facility], [place] [address]  [place], [place] [address]   Acid Fast Smear (AFB)     Status: None   Collection Time: 06/12/21  3:30 PM   Specimen: Bronchoalveolar Lavage  Result Value Ref Range Status   AFB Specimen Processing Concentration  Final   Acid Fast Smear Negative  Final    Comment: (NOTE) Performed At: [facility] [place], [place] [id number] Rush Farmer MD [id number]    Source (AFB) RIGHT UPPER LUNG BAL  Final    Comment: Performed at [facility], [place] [address] [place], [place] [address]  Culture, BAL-quantitative w Gram Stain     Status: None   Collection Time: 06/12/21  3:50 PM   Specimen: Bronchoalveolar Lavage; Respiratory  Result Value Ref Range Status   Specimen Description BRONCHIAL ALVEOLAR LAVAGE  Final   Special Requests LEFT UPPER BAL  Final   Gram Stain   Final    RARE WBC PRESENT, PREDOMINANTLY PMN NO ORGANISMS SEEN    Culture   Final    NO GROWTH 2 DAYS Performed at [facility], [address], [place], [place] [address]    Report Status 06/14/2021 FINAL  Final  Culture, BAL-quantitative w Gram Stain     Status: None   Collection Time: 06/12/21  3:50 PM   Specimen: Bronchoalveolar Lavage; Respiratory  Result Value Ref Range Status   Specimen Description BRONCHIAL ALVEOLAR LAVAGE  Final   Special Requests RIGHT UPPER LUNG BAL  Final   Gram Stain   Final    FEW WBC PRESENT,BOTH PMN AND MONONUCLEAR NO ORGANISMS SEEN    Culture   Final    NO GROWTH 2 DAYS Performed at [facility], [address] [place], [place] [address]    Report Status 06/14/2021 FINAL  Final  Acid Fast Smear (AFB)     Status: None   Collection Time: 06/12/21  3:50 PM   Specimen: Bronchoalveolar Lavage  Result Value Ref Range Status   AFB Specimen Processing Concentration  Final   Acid Fast Smear Negative  Final    Comment: (NOTE) Performed At: [facility] [place], [place] [id number] Rush Farmer MD [id number]    Source  (AFB) LEFT UPPER LUNG BAL  Final    Comment: Performed at [facility], [place] [address] [place], [place] [address]  Fungus Culture With Stain     Status: None (Preliminary result)   Collection Time: 06/12/21  3:50 PM  Result Value Ref Range Status   Fungus Stain Final report  Final    Comment: (NOTE) Performed At: [facility] [place], [place] [id number] Rush Farmer MD [id number]    Fungus (Mycology) Culture PENDING  Incomplete   Fungal Source BRONCHIAL ALVEOLAR LAVAGE  Final    Comment: RIGHT UPPER  Performed at [facility], [place] [address], [place], [place] [address]   Fungus Culture With Stain     Status: None (Preliminary result)   Collection Time: 06/12/21  3:50 PM  Result Value Ref Range Status   Fungus Stain Final report  Final    Comment: (NOTE) Performed At: [facility] [place], [place] [id number] Rush Farmer MD [id number]    Fungus (Mycology) Culture PENDING  Incomplete   Fungal Source BRONCHIAL ALVEOLAR LAVAGE  Final    Comment: LEFT UPPER Performed at [facility], [place] [address], [place], [place] [address]   Fungus Culture Result     Status: None   Collection Time: 06/12/21  3:50 PM  Result Value Ref Range Status   Result 1 Comment  Final    Comment: (NOTE) KOH/Calcofluor preparation:  no fungus observed. Performed At: [facility] [place], [place] [id number] Rush Farmer MD JW:[id number]   Fungus Culture Result     Status: None   Collection Time: 06/12/21  3:50 PM  Result Value Ref Range Status   Result 1 Comment  Final    Comment: (NOTE) KOH/Calcofluor preparation:  no fungus observed. Performed At: [facility] [place], [place] [id number] Rush Farmer MD [id number]      Radiology Studies: DG CHEST PORT 1 VIEW  Result Date: 06/21/2021 CLINICAL DATA:  Shortness of breath. EXAM: PORTABLE CHEST 1 VIEW COMPARISON:   06/18/2021 and CT chest 06/18/2021. FINDINGS: Trachea is midline. Heart is enlarged, stable. Peripheral and basilar predominant coarsened pulmonary markings with superimposed mid and lower lung zone predominant airspace opacities. Lungs are low in volume. No pleural fluid. IMPRESSION: Low lung volumes with persistent airspace opacities in the mid and lower lung zones, which may be due to edema or pneumonia, superimposed on chronic interstitial lung disease. Electronically Signed   By: Lorin Picket M.D.   On: 06/21/2021 08:11     Scheduled Meds:  aspirin EC  81 mg Oral Daily   carvedilol  3.125 mg Oral BID WC   clopidogrel  75 mg Oral Daily   dapagliflozin propanediol  10 mg Oral Daily   digoxin  0.125 mg Oral Daily   enoxaparin (LOVENOX) injection  40 mg Subcutaneous QHS   feeding supplement  1 Container Oral BID BM   folic acid  1 mg Oral Daily   furosemide  40 mg Oral Daily   insulin aspart  0-9 Units Subcutaneous TID WC   losartan  12.5 mg Oral Daily   mouth rinse  15 mL Mouth Rinse BID   multivitamin with minerals  1 tablet Oral Daily   predniSONE  5 mg Oral Q breakfast   rosuvastatin  20 mg Oral Daily   spironolactone  12.5 mg Oral Daily  thiamine  100 mg Oral Daily   Continuous Infusions:  sodium chloride       LOS: 10 days    Time spent: 24mn    PDomenic Polite MD [facility]   06/21/2021, 9:34 AM

## 2021-06-21 NOTE — Assessment & Plan Note (Signed)
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

## 2021-06-21 NOTE — Progress Notes (Signed)
Physical Therapy Treatment Patient Details Name: Veronica Romero MRN: [id number] DOB: [birth year] Today's Date: 06/21/2021   History of Present Illness Pt is a 71 y/o female admitted due to multifocal PNA. Due to worsening respiration status, pt intubated 2/13. Extubated 2/15. ALso with new onset CHF. s/p LHC 06/16/21 with severe LAD stenosis. PMH: recent COVID-19 infection, RA.    PT Comments    Pt progressing towards her physical therapy goals; remains motivated to participate. SpO2 92-94% on RA at rest, but continues to require 2L O2 during mobility to maintain > 90%. Pt able to perform toileting and standing ADL's at sink; then ambulating 60 ft x 2 with a Rollator and one seated rest break. HR 91-120 bpm. D/c plan remains appropriate.     Recommendations for follow up therapy are one component of a multi-disciplinary discharge planning process, led by the attending physician.  Recommendations may be updated based on patient status, additional functional criteria and insurance authorization.  Follow Up Recommendations  Home health PT     Assistance Recommended at Discharge Intermittent Supervision/Assistance  Patient can return home with the following Assistance with cooking/housework;Assistance with feeding;Assist for transportation;Help with stairs or ramp for entrance;A little help with bathing/dressing/bathroom;A little help with walking and/or transfers   Equipment Recommendations  None recommended by PT    Recommendations for Other Services       Precautions / Restrictions Precautions Precautions: Fall;Other (comment) Precaution Comments: watch 02 Restrictions Weight Bearing Restrictions: No     Mobility  Bed Mobility Overal bed mobility: Modified Independent                  Transfers Overall transfer level: Needs assistance Equipment used: None Transfers: Sit to/from Stand Sit to Stand: Supervision                Ambulation/Gait Ambulation/Gait  assistance: Min guard Gait Distance (Feet): 120 Feet (60 ft x 2) Assistive device: Rollator (4 wheels) Gait Pattern/deviations: Step-through pattern, Decreased stride length Gait velocity: decreased     General Gait Details: Mild dynamic instability, min guard for safety, requiring one seated rest break   Stairs             Wheelchair Mobility    Modified Rankin (Stroke Patients Only)       Balance Overall balance assessment: Needs assistance Sitting-balance support: No upper extremity supported, Feet supported Sitting balance-Leahy Scale: Good     Standing balance support: No upper extremity supported, During functional activity Standing balance-Leahy Scale: Fair                              Cognition Arousal/Alertness: Awake/alert Behavior During Therapy: WFL for tasks assessed/performed Overall Cognitive Status: Within Functional Limits for tasks assessed                                          Exercises General Exercises - Lower Extremity Heel Slides: Both, 10 reps, Supine    General Comments        Pertinent Vitals/Pain Pain Assessment Pain Assessment: No/denies pain    Home Living                          Prior Function            PT Goals (current goals can  now be found in the care plan section) Acute Rehab PT Goals Patient Stated Goal: return home Potential to Achieve Goals: Good    Frequency    Min 3X/week      PT Plan Current plan remains appropriate    Co-evaluation              AM-PAC PT "6 Clicks" Mobility   Outcome Measure  Help needed turning from your back to your side while in a flat bed without using bedrails?: None Help needed moving from lying on your back to sitting on the side of a flat bed without using bedrails?: None Help needed moving to and from a bed to a chair (including a wheelchair)?: A Little Help needed standing up from a chair using your arms (e.g.,  wheelchair or bedside chair)?: A Little Help needed to walk in hospital room?: A Little Help needed climbing 3-5 steps with a railing? : A Little 6 Click Score: 20    End of Session Equipment Utilized During Treatment: Oxygen Activity Tolerance: Patient tolerated treatment well Patient left: in chair;with call bell/phone within reach Nurse Communication: Mobility status PT Visit Diagnosis: Unsteadiness on feet (R26.81);Other abnormalities of gait and mobility (R26.89);Muscle weakness (generalized) (M62.81);Difficulty in walking, not elsewhere classified (R26.2)     Time: 0865-7846 PT Time Calculation (min) (ACUTE ONLY): 33 min  Charges:  $Therapeutic Activity: 23-37 mins                     Lillia Pauls, PT, DPT Acute Rehabilitation Services Pager [phone] Office [phone]    Norval Morton 06/21/2021, 9:12 AM

## 2021-06-21 NOTE — TOC Progression Note (Signed)
Transition of Care [facility]) - Progression Note    Patient Details  Name: Veronica Romero MRN: [id number] Date of Birth: 1951-03-11  Transition of Care [facility]) CM/SW Contact  Beckie Busing, RN Phone Number:[phone]  06/21/2021, 3:55 PM  Clinical Narrative:     Transition of Care (TOC) Screening Note   Patient Details  Name: Veronica Romero Date of Birth: December 20, 1950   [facility]) CM/SW Contact:    Beckie Busing, RN Phone Number: 06/21/2021, 3:55 PM    Transition of Care Department [facility]) has reviewed patient and no other  HH has been set up no other TOC needs have been identified at this time. We will continue to monitor patient advancement through interdisciplinary progression rounds.           Expected Discharge Plan and Services                                                 Social Determinants of Health (SDOH) Interventions    Readmission Risk Interventions No flowsheet data found.

## 2021-06-21 NOTE — Assessment & Plan Note (Signed)
Back on prednisone 5 Mg daily-Home dose, methotrexate and Humira on hold

## 2021-06-21 NOTE — Assessment & Plan Note (Signed)
Acute systolic CHF, EF 43-27% -See discussion above -Diuresed with IV Lasix, -3.6 L, still appears somewhat wet on exam,  complicated by pneumonia, will repeat chest x-ray -Also started on losartan, Aldactone, digoxin, low-dose Coreg

## 2021-06-21 NOTE — Plan of Care (Signed)
  Problem: Education: Goal: Understanding of CV disease, CV risk reduction, and recovery process will improve Outcome: Progressing   Problem: Activity: Goal: Ability to return to baseline activity level will improve Outcome: Progressing   

## 2021-06-22 ENCOUNTER — Other Ambulatory Visit (HOSPITAL_COMMUNITY): Payer: Self-pay

## 2021-06-22 DIAGNOSIS — I5021 Acute systolic (congestive) heart failure: Secondary | ICD-10-CM | POA: Diagnosis not present

## 2021-06-22 DIAGNOSIS — J9601 Acute respiratory failure with hypoxia: Secondary | ICD-10-CM | POA: Diagnosis not present

## 2021-06-22 DIAGNOSIS — J9602 Acute respiratory failure with hypercapnia: Secondary | ICD-10-CM

## 2021-06-22 DIAGNOSIS — I214 Non-ST elevation (NSTEMI) myocardial infarction: Secondary | ICD-10-CM | POA: Diagnosis not present

## 2021-06-22 LAB — CBC WITH DIFFERENTIAL/PLATELET
Abs Immature Granulocytes: 0 10*3/uL (ref 0.00–0.07)
Basophils Absolute: 0 10*3/uL (ref 0.0–0.1)
Basophils Relative: 0 %
Eosinophils Absolute: 0.1 10*3/uL (ref 0.0–0.5)
Eosinophils Relative: 1 %
HCT: 31.7 % — ABNORMAL LOW (ref 36.0–46.0)
Hemoglobin: 10.7 g/dL — ABNORMAL LOW (ref 12.0–15.0)
Lymphocytes Relative: 36 %
Lymphs Abs: 4.1 10*3/uL — ABNORMAL HIGH (ref 0.7–4.0)
MCH: 31.1 pg (ref 26.0–34.0)
MCHC: 33.8 g/dL (ref 30.0–36.0)
MCV: 92.2 fL (ref 80.0–100.0)
Monocytes Absolute: 1 10*3/uL (ref 0.1–1.0)
Monocytes Relative: 9 %
Neutro Abs: 6.1 10*3/uL (ref 1.7–7.7)
Neutrophils Relative %: 54 %
Platelets: 410 10*3/uL — ABNORMAL HIGH (ref 150–400)
RBC: 3.44 MIL/uL — ABNORMAL LOW (ref 3.87–5.11)
RDW: 14.2 % (ref 11.5–15.5)
WBC: 11.3 10*3/uL — ABNORMAL HIGH (ref 4.0–10.5)
nRBC: 0 % (ref 0.0–0.2)
nRBC: 0 /100 WBC

## 2021-06-22 LAB — BASIC METABOLIC PANEL
Anion gap: 11 (ref 5–15)
BUN: 14 mg/dL (ref 8–23)
CO2: 24 mmol/L (ref 22–32)
Calcium: 8.7 mg/dL — ABNORMAL LOW (ref 8.9–10.3)
Chloride: 97 mmol/L — ABNORMAL LOW (ref 98–111)
Creatinine, Ser: 0.9 mg/dL (ref 0.44–1.00)
GFR, Estimated: >60 mL/min (ref >60)
Glucose, Bld: 109 mg/dL — ABNORMAL HIGH (ref 70–99)
Potassium: 3.6 mmol/L (ref 3.5–5.1)
Sodium: 132 mmol/L — ABNORMAL LOW (ref 135–145)

## 2021-06-22 LAB — GLUCOSE, CAPILLARY: Glucose-Capillary: 92 mg/dL (ref 70–99)

## 2021-06-22 LAB — DIGOXIN LEVEL: Digoxin Level: 0.3 ng/mL — ABNORMAL LOW (ref 0.8–2.0)

## 2021-06-22 MED ORDER — METHOTREXATE SODIUM CHEMO INJECTION 50 MG/2ML: 50.0000 mg/m2 | INTRAMUSCULAR | Status: DC

## 2021-06-22 MED ORDER — FUROSEMIDE 40 MG PO TABS
40.0000 mg | ORAL_TABLET | Freq: Every day | ORAL | 0 refills | Status: DC
  Filled 2021-06-22: qty 30, 30d supply, fill #0

## 2021-06-22 MED ORDER — DAPAGLIFLOZIN PROPANEDIOL 10 MG PO TABS
10.0000 mg | ORAL_TABLET | Freq: Every day | ORAL | 0 refills | Status: DC
Start: 2021-06-22 — End: 2021-07-18
  Filled 2021-06-22: qty 30, 30d supply, fill #0

## 2021-06-22 MED ORDER — CLOPIDOGREL BISULFATE 75 MG PO TABS
75.0000 mg | ORAL_TABLET | Freq: Every day | ORAL | 0 refills | Status: DC
  Filled 2021-06-22: qty 30, 30d supply, fill #0

## 2021-06-22 MED ORDER — DIGOXIN 125 MCG PO TABS
0.1250 mg | ORAL_TABLET | Freq: Every day | ORAL | 0 refills | Status: DC
  Filled 2021-06-22: qty 30, 30d supply, fill #0

## 2021-06-22 MED ORDER — LOSARTAN POTASSIUM 25 MG PO TABS
12.5000 mg | ORAL_TABLET | Freq: Every day | ORAL | 0 refills | Status: DC
  Filled 2021-06-22: qty 30, 60d supply, fill #0

## 2021-06-22 MED ORDER — SPIRONOLACTONE 25 MG PO TABS
12.5000 mg | ORAL_TABLET | Freq: Every day | ORAL | 0 refills | Status: DC
  Filled 2021-06-22: qty 30, 60d supply, fill #0

## 2021-06-22 MED ORDER — CARVEDILOL 3.125 MG PO TABS
3.1250 mg | ORAL_TABLET | Freq: Two times a day (BID) | ORAL | 0 refills | Status: DC
  Filled 2021-06-22: qty 60, 30d supply, fill #0

## 2021-06-22 MED ORDER — ASPIRIN 81 MG PO TBEC: 81.0000 mg | DELAYED_RELEASE_TABLET | Freq: Every day | ORAL | 0 refills | Status: DC

## 2021-06-22 MED ORDER — ROSUVASTATIN CALCIUM 20 MG PO TABS
20.0000 mg | ORAL_TABLET | Freq: Every day | ORAL | 0 refills | Status: DC
  Filled 2021-06-22: qty 30, 30d supply, fill #0

## 2021-06-22 NOTE — Progress Notes (Signed)
Cardiology Progress Note  Patient ID: Veronica Romero MRN: [id number] DOB: Mar 30, 1951 Date of Encounter: 06/22/2021  Primary Cardiologist: Berniece Salines, DO  Subjective   Chief Complaint: Shortness of breath  HPI: Satting well on RA. She is improving. Renal function is normal. Hgb stable 10.  ROS:  All other ROS reviewed and negative. Pertinent positives noted in the HPI.     Inpatient Medications  Scheduled Meds:  aspirin EC  81 mg Oral Daily   carvedilol  3.125 mg Oral BID WC   clopidogrel  75 mg Oral Daily   dapagliflozin propanediol  10 mg Oral Daily   digoxin  0.125 mg Oral Daily   enoxaparin (LOVENOX) injection  40 mg Subcutaneous QHS   feeding supplement  1 Container Oral BID BM   folic acid  1 mg Oral Daily   furosemide  40 mg Oral Daily   insulin aspart  0-9 Units Subcutaneous TID WC   losartan  12.5 mg Oral Daily   mouth rinse  15 mL Mouth Rinse BID   multivitamin with minerals  1 tablet Oral Daily   predniSONE  5 mg Oral Q breakfast   rosuvastatin  20 mg Oral Daily   spironolactone  12.5 mg Oral Daily   thiamine  100 mg Oral Daily   Continuous Infusions:  sodium chloride     PRN Meds: acetaminophen **OR** acetaminophen, benzonatate, docusate, levalbuterol, menthol-cetylpyridinium, ondansetron (ZOFRAN) IV, polyethylene glycol, polyvinyl alcohol, sodium chloride, sodium chloride flush   Vital Signs   Vitals:   06/21/21 2313 06/22/21 0346 06/22/21 0641 06/22/21 0712  BP: (!) 104/50 (!) 107/52  (!) 105/50  Pulse: 85 76  95  Resp: 16 15  20   Temp: 98.3 F (36.8 C) 98.6 F (37 C)  97.8 F (36.6 C)  TempSrc: Oral Oral  Oral  SpO2: 94% 98%  94%  Weight:   68.3 kg   Height:        Intake/Output Summary (Last 24 hours) at 06/22/2021 0915 Last data filed at 06/22/2021 0640 Gross per 24 hour  Intake 240 ml  Output 925 ml  Net -685 ml   Last 3 Weights 06/22/2021 06/21/2021 06/19/2021  Weight (lbs) 150 lb 9.2 oz 151 lb 7.3 oz 155 lb 10.3 oz  Weight (kg) 68.3  kg 68.7 kg 70.6 kg      Telemetry  Sinus rhythm  Physical Exam   Vitals:   06/21/21 2313 06/22/21 0346 06/22/21 0641 06/22/21 0712  BP: (!) 104/50 (!) 107/52  (!) 105/50  Pulse: 85 76  95  Resp: 16 15  20   Temp: 98.3 F (36.8 C) 98.6 F (37 C)  97.8 F (36.6 C)  TempSrc: Oral Oral  Oral  SpO2: 94% 98%  94%  Weight:   68.3 kg   Height:        Intake/Output Summary (Last 24 hours) at 06/22/2021 0915 Last data filed at 06/22/2021 0640 Gross per 24 hour  Intake 240 ml  Output 925 ml  Net -685 ml    Last 3 Weights 06/22/2021 06/21/2021 06/19/2021  Weight (lbs) 150 lb 9.2 oz 151 lb 7.3 oz 155 lb 10.3 oz  Weight (kg) 68.3 kg 68.7 kg 70.6 kg    Body mass index is 24.3 kg/m.   Physical Exam  Gen: no distress Neuro: alert and oriented CV: r,r,r no murmurs.  Pulm: satting on RA 90s, mild wob,  decreased BS with inspiratory crackles Abd: non distended Ext: No LE edema Skin: warm and well  perfused Psych: normal mood   Labs  High Sensitivity Troponin:   Recent Labs  Lab 06/11/21 1645 06/11/21 2229 06/12/21 1115 06/12/21 1627 06/13/21 0553  TROPONINIHS 25* 19* 768* 3,044* 1,806*     Cardiac EnzymesNo results for input(s): TROPONINI in the last 168 hours. No results for input(s): TROPIPOC in the last 168 hours.  Chemistry Recent Labs  Lab 06/18/21 0059 06/19/21 0106 06/20/21 0100 06/21/21 0059 06/22/21 0107  NA 138 136 137 135 132*  K 3.4* 4.0 4.2 3.9 3.6  CL 102 101 103 98 97*  CO2 26 26 22 26 24   GLUCOSE 114* 98 90 107* 109*  BUN 11 11 13 14 14   CREATININE 0.82 0.79 0.71 0.78 0.90  CALCIUM 8.5* 8.6* 8.7* 8.9 8.7*  PROT 6.4* 6.5 6.7  --   --   ALBUMIN 2.4* 2.5* 2.7*  --   --   AST 49* 37 28  --   --   ALT 42 41 32  --   --   ALKPHOS 64 62 61  --   --   BILITOT 0.3 0.3 0.4  --   --   GFRNONAA >60 >60 >60 >60 >60  ANIONGAP 10 9 12 11 11     Hematology Recent Labs  Lab 06/20/21 0100 06/21/21 0059 06/22/21 0107  WBC 11.7* 10.5 11.3*  RBC 3.30* 3.51*  3.44*  HGB 10.2* 10.9* 10.7*  HCT 31.8* 32.5* 31.7*  MCV 96.4 92.6 92.2  MCH 30.9 31.1 31.1  MCHC 32.1 33.5 33.8  RDW 14.2 14.2 14.2  PLT 389 417* 410*   BNP Recent Labs  Lab 06/19/21 0106 06/20/21 0100 06/21/21 0059  BNP 207.6* 171.8* 134.5*    DDimer No results for input(s): DDIMER in the last 168 hours.   Radiology  DG CHEST PORT 1 VIEW  Result Date: 06/21/2021 CLINICAL DATA:  Shortness of breath. EXAM: PORTABLE CHEST 1 VIEW COMPARISON:  06/18/2021 and CT chest 06/18/2021. FINDINGS: Trachea is midline. Heart is enlarged, stable. Peripheral and basilar predominant coarsened pulmonary markings with superimposed mid and lower lung zone predominant airspace opacities. Lungs are low in volume. No pleural fluid. IMPRESSION: Low lung volumes with persistent airspace opacities in the mid and lower lung zones, which may be due to edema or pneumonia, superimposed on chronic interstitial lung disease. Electronically Signed   By: Lorin Picket M.D.   On: 06/21/2021 08:11    Cardiac Studies   TTE 06/12/2021  1. Left ventricular ejection fraction, by estimation, is 25 to 30%. The  left ventricle has severely decreased function. The left ventricle  demonstrates regional wall motion abnormalities (see scoring  diagram/findings for description). Left ventricular  diastolic parameters are consistent with Grade I diastolic dysfunction  (impaired relaxation).   2. Right ventricular systolic function is mildly reduced. The right  ventricular size is normal. There is mildly elevated pulmonary artery  systolic pressure. The estimated right ventricular systolic pressure is  0000000 mmHg.   3. The mitral valve is normal in structure. Trivial mitral valve  regurgitation. No evidence of mitral stenosis.   4. The aortic valve is tricuspid. Aortic valve regurgitation is mild.  Aortic valve sclerosis/calcification is present, without any evidence of  aortic stenosis.   5. The inferior vena cava is  dilated in size with <50% respiratory  variability, suggesting right atrial pressure of 15 mmHg.   LHC/RHC 06/16/2021   Ost LAD to Mid LAD lesion is 85% stenosed.   Prox RCA lesion is 50% stenosed.  LPAV lesion is 50% stenosed.   There is mild to moderate left ventricular systolic dysfunction.   LV end diastolic pressure is normal.   LV end diastolic pressure is normal.   The left ventricular ejection fraction is 35-45% by visual estimate.   Hemodynamic findings consistent with mild pulmonary hypertension.  Cardiac Index 4.1 -normal   CONCLUSIONS: Calcified segmental 85% proximal LAD Dominant circumflex without significant obstruction.  Distally before the PDA there is segmental 50% narrowing. Left main is widely patent Right coronary was poorly visualized, is nondominant, and contains mid 50% stenosis. Low normal to mild systolic dysfunction with EF in the 35 to 45% range.  LVEDP 14 mmHg. Mild pulmonary hypertension with mean PA pressure 22 mmHg; mean wedge pressure 14 mmHg; right atrial mean 6 mmHg.  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  Patient Profile  Veronica Romero is a 71 y.o. female with rheumatoid arthritis who was admitted on 06/14/2021 with acute hypoxic respiratory failure secondary to pneumonia in the setting of immunosuppression.  Found to have new onset systolic heart failure.  Left heart catheterization with severe LAD stenosis medically managed 2/2 PNA  Assessment & Plan   #NSTEMI: cath 85% prox LAD lesion; stable. Anterior wall from base to apex is akinetic -Admitted with pneumonia.  Found to have secondary myocardial infarction.  Left heart catheterization with severe proximal LAD stenosis.  Continue medical management with staged intervention when she is recovered from pneumonia. Scheduling LHC as an outpatient and arrange for follow-up -Continue aspirin 81 mg daily and Plavix 75 mg daily  -On statin -EF 25-30%.  Treating as below.  #Acute systolic heart failure,  EF 25-30%; presentation complicated by PNA RV function is mildly reduced. 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 - continue oral lasix 40 mg daily  -cont Coreg 3.125 mg twice daily  -Digoxin 0.125 mg daily was added over the weekend to assist with blood pressure and tolerating GDMT.  - continue  Losartan 12.5 mg daily was added.  - Can transition to [facility] as an outpatient  - Continue Aldactone 12.5 mg daily.  She is stable from a cardiac standpoint for discharge. We will schedule LHC as an outpatient. We re-discussed risks and benefits. She is full code.  She is in agreement. Will also plan for follow up with her primary cardiologist Dr. Harriet Masson  The patient understands that risks included but are not limited to stroke (1 in 1000), death (1 in 42), kidney failure [usually temporary] (1 in 500), bleeding (1 in 200), allergic reaction [possibly serious] (1 in 200).    For questions or updates, please contact [place] Please consult [email or web address] for contact info under     Janina Mayo, MD

## 2021-06-22 NOTE — Progress Notes (Signed)
I have scheduled Veronica Romero to be seen in clinic with Dr. Servando Salina on 07/13/21 to ensure she has recovered from her acute illness. I have proactively scheduled her atherectomy with Dr. [place] on 07/17/21 for first case.   Marcelino Duster, PA-C 06/22/2021, 10:00 AM [phone] [facility] [address] [place], [place] [address]

## 2021-06-22 NOTE — TOC Progression Note (Signed)
Transition of Care [facility]) - Progression Note    Patient Details  Name: Veronica Romero MRN: [id number] Date of Birth: 02-Jan-1951  [facility] CM/SW Contact  Beckie Busing, RN Phone Number:[phone]  06/22/2021, 9:43 AM  Clinical Narrative:    Home O2 referral has been called to [facility] with [employer]. O2 to be delivered to the bedside. Home Health previously set up with [facility]. CM has made Stacie with  [facility] aware of discharge.          Expected Discharge Plan and Services           Expected Discharge Date: 06/22/21                                     Social Determinants of Health (SDOH) Interventions    Readmission Risk Interventions No flowsheet data found.

## 2021-06-22 NOTE — Discharge Summary (Signed)
Physician Discharge Summary  Veronica Romero GNO:[id number] DOB: 01/08/1951 DOA: 06/11/2021  PCP: Pcp, No  Admit date: 06/11/2021 Discharge date: 06/22/2021  Time spent: 45 minutes  Recommendations for Outpatient Follow-up:  PCP in 1 week, please follow-up fungal cultures Cardiology Dr. Harriet Masson in 2 weeks, please check BMP at follow-up, needs outpatient cardiac cath-for LAD stenosis Repeat chest x-ray or CT chest in 2 to 3 months Home health PT RN, discharged home on home O2 Please review all imaging studies from this admission for any incidental findings that need follow-up  Discharge Diagnoses:  Principal Problem:   Acute respiratory failure with hypoxia and hypercapnia (HCC) Multifocal pneumonia   Acute on chronic systolic heart failure (HCC)   CAD (coronary artery disease), native coronary artery   NSTEMI (non-ST elevated myocardial infarction) ([place])   Malnutrition of moderate degree   Rheumatoid arthritis ([place])   Multifocal pneumonia   Dyspnea   Alcohol use   Discharge Condition: Stable  Diet recommendation: Low-sodium, heart healthy  Filed Weights   06/19/21 0625 06/21/21 0506 06/22/21 0641  Weight: 70.6 kg 68.7 kg 68.3 kg    History of present illness and brief narrative 71/F with history of rheumatoid arthritis on methotrexate, prednisone, Humira, recent COVID-19 infection was admitted to the hospital with acute hypoxic respiratory failure, community-acquired pneumonia and acute CHF she was intubated, admitted to the ICU, underwent diagnostic bronc which was unrevealing, extubated 2/15.  She was also found to have new onset heart failure, cardiology consulted, left heart cath noted severe LAD stenosis, medically managed in the setting of pneumonia, plan for PCI/stenting in 2 weeks as outpatient    Hospital Course:  Acute respiratory failure with hypoxia and hypercapnia ([place])- (present on admission) - Secondary to multifocal pneumonia and acute CHF -Immunocompromised  from RA meds -Extubated 2/15, underwent diagnostic bronchoscopy, BAL cultures, AFB smear are negative, fungal cultures pending needs follow-up -Completed ceftriaxone, Zosyn on 2/20 -Was on IV steroids which have been tapered off back to her baseline dose of prednisone 5 Mg daily -O2 weaned down, now requires oxygen with activity, discharged home on home O2   Acute on chronic systolic heart failure (HCC) Acute systolic CHF, EF 16-94% -See discussion above -Diuresed with IV Lasix, -3.6 L,complicated by pneumonia -started on losartan, Aldactone, digoxin, low-dose Coreg -Close follow-up with cardiology in 2 weeks, please check BMP at follow-up   CAD (coronary artery disease), native coronary artery NSTEMI -Followed by cardiology this admission, echo noted EF of 25-30%, anterior wall from base to apex was akinetic, hospital course complicated by pneumonia, underwent diagnostic cath which showed severe proximal LAD stenosis -Per cardiology plan for medical management with staged intervention when she has recovered from pneumonia in a few weeks as outpatient -Continue aspirin, Plavix, carvedilol, statin   Rheumatoid arthritis ([place]) Back on prednisone 5 Mg daily-Home dose, methotrexate and Humira on hold   Alcohol use Counseled, continue thiamine, no withdrawal noted  Procedures  2D echo 2/23  1. Left ventricular ejection fraction, by estimation, is 25 to 30%. The  left ventricle has severely decreased function. The left ventricle  demonstrates regional wall motion abnormalities (see scoring  diagram/findings for description). Left ventricular  diastolic parameters are consistent with Grade I diastolic dysfunction  (impaired relaxation).   2. Right ventricular systolic function is mildly reduced. The right  ventricular size is normal. There is mildly elevated pulmonary artery  systolic pressure. The estimated right ventricular systolic pressure is  50.3 mmHg.   3. The mitral valve is  normal in structure. Trivial mitral valve  regurgitation. No evidence of mitral stenosis.   4. The aortic valve is tricuspid. Aortic valve regurgitation is mild.  Aortic valve sclerosis/calcification is present, without any evidence of  aortic stenosis.   5. The inferior vena cava is dilated in size with <50% respiratory  variability, suggesting right atrial pressure of 15 mmHg.    LHC/RHC 06/16/2021   Ost LAD to Mid LAD lesion is 85% stenosed.   Prox RCA lesion is 50% stenosed.   LPAV lesion is 50% stenosed.   There is mild to moderate left ventricular systolic dysfunction.   LV end diastolic pressure is normal.   LV end diastolic pressure is normal.   The left ventricular ejection fraction is 35-45% by visual estimate.   Hemodynamic findings consistent with mild pulmonary hypertension.   Cardiac Index 4.1 -normal   CONCLUSIONS: Calcified segmental 85% proximal LAD Dominant circumflex without significant obstruction.  Distally before the PDA there is segmental 50% narrowing. Left main is widely patent Right coronary was poorly visualized, is nondominant, and contains mid 50% stenosis. Low normal to mild systolic dysfunction with EF in the 35 to 45% range.  LVEDP 14 mmHg. Mild pulmonary hypertension with mean PA pressure 22 mmHg; mean wedge pressure 14 mmHg; right atrial mean 6 mmHg.  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  Discharge Exam: Vitals:   06/22/21 0346 06/22/21 0712  BP: (!) 107/52 (!) 105/50  Pulse: 76 95  Resp: 15 20  Temp: 98.6 F (37 C) 97.8 F (36.6 C)  SpO2: 98% 94%   General exam: Pleasant elderly female sitting up in bed, AAOx3, no distress HEENT: Positive JVD CVS: S1-S2, regular rhythm Lungs: Bilateral lower lobe Rales and rhonchi Abdomen: Soft, nontender, bowel sounds present Extremities: No edema Skin: No rashes on exposed skin  Skin: No rashes Psychiatry:  Mood & affect appropriate.    Discharge Instructions   Discharge Instructions     Diet  - low sodium heart healthy   Complete by: As directed    Increase activity slowly   Complete by: As directed       Allergies as of 06/22/2021       Reactions   Penicillins Rash   Other reaction(s): RASH Other reaction(s): RASH        Medication List     TAKE these medications    aspirin 81 MG EC tablet Take 1 tablet (81 mg total) by mouth daily. Swallow whole.   carvedilol 3.125 MG tablet Commonly known as: COREG Take 1 tablet (3.125 mg total) by mouth 2 (two) times daily with a meal.   clopidogrel 75 MG tablet Commonly known as: PLAVIX Take 1 tablet (75 mg total) by mouth daily.   digoxin 0.125 MG tablet Commonly known as: LANOXIN Take 1 tablet (0.125 mg total) by mouth daily.   Farxiga 10 MG Tabs tablet Generic drug: dapagliflozin propanediol Take 1 tablet (10 mg total) by mouth daily.   furosemide 40 MG tablet Commonly known as: LASIX Take 1 tablet (40 mg total) by mouth daily.   Humira Pen 40 MG/0.4ML Pnkt Generic drug: Adalimumab Inject 0.4 mLs into the skin once a week. Tuesday   leucovorin 5 MG tablet Commonly known as: WELLCOVORIN Take 15 mg by mouth once a week. Saturdays   losartan 25 MG tablet Commonly known as: COZAAR Take 1/2 tablet (12.5 mg total) by mouth daily.   methotrexate 50 MG/2ML injection Inject 3.4 mLs (85 mg total) into the muscle once a  week. Hold methotrexate for 2 more weeks and then resume 3/10 Start taking on: July 07, 2021 What changed:  how much to take additional instructions These instructions start on July 07, 2021. If you are unsure what to do until then, ask your doctor or other care provider.   predniSONE 5 MG tablet Commonly known as: DELTASONE Take 5 mg by mouth daily.   rosuvastatin 20 MG tablet Commonly known as: CRESTOR Take 1 tablet (20 mg total) by mouth daily.   spironolactone 25 MG tablet Commonly known as: ALDACTONE Take 1/2 tablets (12.5 mg total) by mouth daily.                Durable Medical Equipment  (From admission, onward)           Start     Ordered   06/22/21 0911  For home use only DME oxygen  Once       Question Answer Comment  Length of Need 6 Months   Mode or (Route) Nasal cannula   Liters per Minute 3   Oxygen conserving device Yes   Oxygen delivery system Gas      06/22/21 0910           Allergies  Allergen Reactions   Penicillins Rash    Other reaction(s): RASH Other reaction(s): RASH     Follow-up Information     [facility] Follow up.   Specialty: Home Health Services Why: Your home health has been set up with [address]. The office will call you with start of service information. If you have any questions please call number listed above. Contact information: [address] [place] [address] [phone]         Berniece Salines, DO. Go in 2 week(s).   Specialty: Cardiology Contact information: [address] [place] [address] [phone]                  The results of significant diagnostics from this hospitalization (including imaging, microbiology, ancillary and laboratory) are listed below for reference.    Significant Diagnostic Studies: DG Chest 2 View  Result Date: 06/11/2021 CLINICAL DATA:  Chest pain, cough, congestion EXAM: CHEST - 2 VIEW COMPARISON:  None. FINDINGS: Heart size within normal limits. Extensive patchy airspace opacities throughout both lungs. No pleural effusion. No pneumothorax. Exaggerated thoracic kyphosis. IMPRESSION: Extensive patchy airspace opacities throughout both lungs, suspicious for multifocal pneumonia. Electronically Signed   By: Davina Poke D.O.   On: 06/11/2021 17:39   CT CHEST WO CONTRAST  Result Date: 06/18/2021 CLINICAL DATA:  Complicated pneumonia follow-up EXAM: CT CHEST WITHOUT CONTRAST TECHNIQUE: Multidetector CT imaging of the chest was performed following the standard protocol without IV contrast. RADIATION DOSE  REDUCTION: This exam was performed according to the departmental dose-optimization program which includes automated exposure control, adjustment of the mA and/or kV according to patient size and/or use of iterative reconstruction technique. COMPARISON:  CT chest 06/11/2021 FINDINGS: Cardiovascular: Heart is mildly enlarged. No pericardial effusion identified. Extensive coronary artery calcifications. Main pulmonary artery is upper normal caliber. Thoracic aorta is normal in caliber with moderate calcified plaques. Mediastinum/Nodes: No bulky axillary or mediastinal lymphadenopathy identified. Likely prominent bilateral hilar lymph nodes, limited evaluation without contrast. Lungs/Pleura: Persistent extensive increased interstitial densities and irregular ground-glass alveolar densities throughout both lungs. The ground-glass infiltrates are decreased in size and density since previous study with overall improved aeration of the lungs. No pleural effusion. No pneumothorax. Upper Abdomen: Moderate-sized  hiatal hernia. Musculoskeletal: Degenerative changes of the thoracic spine and old compression fracture in the lower thoracic spine. No suspicious bony lesions identified. IMPRESSION: 1. Improvement since previous study, however persistent extensive interstitial and irregular ground-glass infiltrates bilaterally. 2. Mild cardiomegaly. Coronary artery disease and atherosclerotic disease. 3. Moderate hiatal hernia. Aortic Atherosclerosis (ICD10-I70.0). Electronically Signed   By: Ofilia Neas M.D.   On: 06/18/2021 13:08   CT CHEST WO CONTRAST  Result Date: 06/11/2021 CLINICAL DATA:  Recent COVID-19 infection. Current chest radiograph demonstrating airspace lung opacities suspicious for multifocal pneumonia. EXAM: CT CHEST WITHOUT CONTRAST TECHNIQUE: Multidetector CT imaging of the chest was performed following the standard protocol without IV contrast. RADIATION DOSE REDUCTION: This exam was performed according  to the departmental dose-optimization program which includes automated exposure control, adjustment of the mA and/or kV according to patient size and/or use of iterative reconstruction technique. COMPARISON:  Current chest radiograph. FINDINGS: Cardiovascular: Heart normal in size. No pericardial effusion. Left and circumflex coronary artery calcifications. Main pulmonary artery dilated to 3.7 cm. Aorta normal in caliber. Mild aortic atherosclerotic calcifications. Mediastinum/Nodes: Moderate hiatal hernia. Esophagus is unremarkable. Subcentimeter shoddy mediastinal lymph nodes. No enlarged nodes. No mediastinal or hilar masses. Trachea is unremarkable. Lungs/Pleura: Patchy bilateral ground-glass lung opacities consistent with multifocal pneumonia, atypical etiologies including COVID-19 infection in the differential diagnosis. No evidence of pulmonary edema. No pleural effusion or pneumothorax. Upper Abdomen: No acute abnormality. Musculoskeletal: Moderate compression fracture of T11. Mild compression fracture of T12. These are of unclear chronicity, but appear chronic. No other fractures. No bone lesions. IMPRESSION: 1. Numerous bilateral patchy areas of ground-glass opacity consistent with multifocal pneumonia. 2. Mild dilation of the main pulmonary artery suggesting pulmonary artery hypertension. 3. Aortic atherosclerosis. 4. Compression fractures of T11 and T12, most likely chronic. 5. Coronary artery calcifications. Aortic Atherosclerosis (ICD10-I70.0). Electronically Signed   By: Lajean Manes M.D.   On: 06/11/2021 20:31   CT Angio Chest PE W/Cm &/Or Wo Cm  Result Date: 06/11/2021 CLINICAL DATA:  Chest pain and shortness of breath. EXAM: CT ANGIOGRAPHY CHEST WITH CONTRAST TECHNIQUE: Multidetector CT imaging of the chest was performed using the standard protocol during bolus administration of intravenous contrast. Multiplanar CT image reconstructions and MIPs were obtained to evaluate the vascular anatomy.  RADIATION DOSE REDUCTION: This exam was performed according to the departmental dose-optimization program which includes automated exposure control, adjustment of the mA and/or kV according to patient size and/or use of iterative reconstruction technique. CONTRAST:  15m OMNIPAQUE IOHEXOL 350 MG/ML SOLN COMPARISON:  PA and lateral chest and noncontrast chest CT earlier today are the only chest priors FINDINGS: Cardiovascular: The heart is slightly enlarged. There is no pericardial effusion. There are calcifications in the left main, lad and circumflex coronary arteries. The aorta is normal in caliber with mild descending tortuosity and with mild patchy calcific atherosclerosis. No dissection or aneurysm is seen. There are nonstenosing calcifications in the left subclavian artery. The great vessels otherwise unremarkable. Prominent pulmonary trunk again noted measuring 3.4 cm indicating arterial hypertension, but there is no right heart chamber expansion, IVC reflux or appreciable arterial embolic filling defect. Central pulmonary veins are slightly distended. Mediastinum/Nodes: Visualized thyroid gland is unremarkable. Axillary spaces are clear. A moderate-sized hiatal hernia is again seen with unremarkable thoracic esophagus. There are mildly prominent scattered right hilar lymph nodes up to 1.3 cm in short axis, few mildly prominent right hilar nodes up to 1.2 cm in short axis, and subcarinal mediastinal nodes also up to 1.2 cm  in short axis. There is no further adenopathy.  The trachea is clear. Lungs/Pleura: Again noted are extensive bilateral interstitial and ground-glass infiltrates, widespread throughout both lungs, with a central and peripheral predominance, most likely indicating extensive pneumonia. There are trace pleural effusions. No pneumothorax or pneumomediastinum. Upper Abdomen: No acute abnormality. Mild eventration and elevation right hemidiaphragm. Calcified granulomas in the spleen. Splenic  artery is heavily calcified. Musculoskeletal: Chronic appearing compression fractures of the T11 and T12 vertebral bodies are again noted with exaggerated thoracic kyphosis, mild osteopenia, and degenerative change of the spine. Hemangiomatous replacement of much of the T8 vertebral body is incidentally seen. There is a chronic trauma deformity of the proximal body of the sternum. The ribcage is intact. Review of the MIP images confirms the above findings. IMPRESSION: 1. Prominent pulmonary trunk without evidence of arterial emboli or acute right heart strain. 2. Extensive interstitial and ground-glass infiltrates throughout the lungs, predominating in the center and periphery compatible with multifocal pneumonia including viral and atypical etiologies such as COVID pneumonia. Has the patient been tested? Underlying edema component possible but believed unlikely. There are trace pleural effusions. 3. Mildly prominent hilar and mediastinal lymph nodes. Follow-up study recommended after treatment or PET-CT. 4. Slightly prominent heart and central pulmonary veins, with aortic and coronary artery atherosclerosis. 5. Moderate-sized hiatal hernia. 6. Chronic appearing compression fractures of the T11 and T12 vertebral bodies. Electronically Signed   By: Telford Nab M.D.   On: 06/11/2021 22:03   CARDIAC CATHETERIZATION  Addendum Date: 06/17/2021     Ost LAD to Mid LAD lesion is 85% stenosed.   Prox RCA lesion is 50% stenosed.   LPAV lesion is 50% stenosed.   There is mild to moderate left ventricular systolic dysfunction.   LV end diastolic pressure is normal.   LV end diastolic pressure is normal.   The left ventricular ejection fraction is 35-45% by visual estimate.   Hemodynamic findings consistent with mild pulmonary hypertension. CONCLUSIONS: Calcified segmental 85% proximal LAD Dominant circumflex without significant obstruction.  Distally before the PDA there is segmental 50% narrowing. Left main is widely  patent Right coronary was poorly visualized, is nondominant, and contains mid 50% stenosis. Low normal to mild systolic dysfunction with EF in the 35 to 45% range.  LVEDP 14 mmHg. Mild pulmonary hypertension with mean PA pressure 22 mmHg; mean wedge pressure 14 mmHg; right atrial mean 6 mmHg.  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 RECOMMENDATION: Consider starting dual antiplatelet therapy if felt safe.  Recommend Plavix 75 mg/day. Continue to treat heart failure and recuperation from pneumonia. Orbital atherectomy and stenting of the LAD in days to weeks once the patient is stronger. Earlier percutaneous management if the patient has ischemic symptoms. LV function is improving and is clearly better than 25 to 30% as noted above. Hemodynamics are reasonable and would start basic guideline directed therapy for heart failure.  Addendum Date: 06/16/2021     Ost LAD to Mid LAD lesion is 85% stenosed.   Prox RCA lesion is 50% stenosed.   LPAV lesion is 50% stenosed.   There is mild to moderate left ventricular systolic dysfunction.   LV end diastolic pressure is normal.   LV end diastolic pressure is normal.   The left ventricular ejection fraction is 35-45% by visual estimate.   Hemodynamic findings consistent with mild pulmonary hypertension. CONCLUSIONS: Calcified segmental 85% proximal LAD Dominant circumflex without significant obstruction.  Distally before the PDA there is segmental 50% narrowing. Left main is widely  patent Right coronary was poorly visualized, is nondominant, and contains mid 50% stenosis. Low normal to mild systolic dysfunction with EF in the 35 to 45% range.  LVEDP 14 mmHg. Mild pulmonary hypertension with mean PA pressure 22 mmHg; mean wedge pressure 14 mmHg; right atrial mean 6 mmHg.  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 RECOMMENDATION: Consider starting dual antiplatelet therapy if felt safe.  I will recommend Plavix. Continue to treat heart failure and recuperate from pneumonia. Active  orbital atherectomy and stenting of the LAD and days 2 weeks once the patient is stronger. Earlier management if the patient has ischemic symptoms. LV function is improving and is clearly better than 25 to 30% on this occasion as noted above. Hemodynamics are reasonable but would start basic guideline directed therapy for heart failure.   Result Date: 06/17/2021   Ost LAD to Mid LAD lesion is 85% stenosed.   Prox RCA lesion is 50% stenosed.   LPAV lesion is 50% stenosed.   There is mild to moderate left ventricular systolic dysfunction.   LV end diastolic pressure is normal.   LV end diastolic pressure is normal.   The left ventricular ejection fraction is 35-45% by visual estimate.   Hemodynamic findings consistent with mild pulmonary hypertension. CONCLUSIONS: Calcified segmental 85% proximal LAD Dominant circumflex without significant obstruction.  Distally before the PDA there is segmental 50% narrowing. Left main is widely patent Right coronary was poorly visualized, is nondominant, and contains mid 50% stenosis. Low normal to mild systolic dysfunction with EF in the 35 to 45% range.  LVEDP 14 mmHg. Mild pulmonary hypertension with mean PA pressure 22 mmHg; mean wedge pressure 14 mmHg; right atrial mean 6 mmHg.  Pulmonary hypertension group 2. RECOMMENDATION: Consider starting dual antiplatelet therapy if felt safe.  I will recommend Plavix. Continue to treat heart failure and recuperate from pneumonia. Active orbital atherectomy and stenting of the LAD and days 2 weeks once the patient is stronger. Earlier management if the patient has ischemic symptoms. LV function is improving and is clearly better than 25 to 30% on this occasion as noted above. Hemodynamics are reasonable but would start basic guideline directed therapy for heart failure.  DG CHEST PORT 1 VIEW  Result Date: 06/21/2021 CLINICAL DATA:  Shortness of breath. EXAM: PORTABLE CHEST 1 VIEW COMPARISON:  06/18/2021 and CT chest 06/18/2021.  FINDINGS: Trachea is midline. Heart is enlarged, stable. Peripheral and basilar predominant coarsened pulmonary markings with superimposed mid and lower lung zone predominant airspace opacities. Lungs are low in volume. No pleural fluid. IMPRESSION: Low lung volumes with persistent airspace opacities in the mid and lower lung zones, which may be due to edema or pneumonia, superimposed on chronic interstitial lung disease. Electronically Signed   By: Lorin Picket M.D.   On: 06/21/2021 08:11   DG Chest Port 1 View  Result Date: 06/18/2021 CLINICAL DATA:  Cough, shortness of breath and chest pain. EXAM: PORTABLE CHEST 1 VIEW COMPARISON:  06/17/2021 FINDINGS: Stable cardiomediastinal contours. Lung volumes are low. Bilateral interstitial and airspace densities are again noted. These are not significantly changed when compared with the previous exam. No new findings. IMPRESSION: No change in bilateral interstitial and patchy airspace opacities. Electronically Signed   By: Kerby Moors M.D.   On: 06/18/2021 08:59   DG Chest Port 1 View  Result Date: 06/17/2021 CLINICAL DATA:  Shortness of breath. EXAM: PORTABLE CHEST 1 VIEW COMPARISON:  06/12/2021 FINDINGS: Heart size is normal. Bilateral interstitial and airspace opacities are again noted  and are unchanged compared with the previous exam. Lung volumes remain low. No pleural effusion identified. IMPRESSION: No change in aeration to the lungs compared with previous exam. Electronically Signed   By: Kerby Moors M.D.   On: 06/17/2021 07:11   DG CHEST PORT 1 VIEW  Result Date: 06/12/2021 CLINICAL DATA:  Post bronchial filler lavage EXAM: PORTABLE CHEST 1 VIEW COMPARISON:  Portable exam 1637 hours compared to 0908 hours FINDINGS: Tip of endotracheal tube projects 4.4 cm above carina. Feeding tube extends into stomach. Stable heart size and mediastinal contours. BILATERAL pulmonary infiltrates, slightly improved. No pleural effusion or pneumothorax. Skin  folds project over LEFT upper lobe. IMPRESSION: Diffuse BILATERAL pulmonary infiltrates, slightly improved. Electronically Signed   By: Lavonia Dana M.D.   On: 06/12/2021 16:54   DG Chest Portable 1 View  Result Date: 06/12/2021 CLINICAL DATA:  Evaluate ET tube placement EXAM: PORTABLE CHEST 1 VIEW COMPARISON:  06/11/2021 FINDINGS: The ET tube tip is above the carina. Enteric tube courses below the level of the hemidiaphragms with side port approximately 6 cm below the GE junction. Stable cardiomediastinal contours. There is been significant interval increase in bilateral interstitial and airspace opacities involving the upper and lower lung zones. The visualized osseous structures appear intact. IMPRESSION: 1. Satisfactory position of ET tube. 2. Significant interval increase in bilateral interstitial and airspace opacities involving the upper and lower lung zones compatible with worsening pulmonary edema versus multifocal infection. Electronically Signed   By: Kerby Moors M.D.   On: 06/12/2021 09:22   DG Abd Portable 1V  Result Date: 06/12/2021 CLINICAL DATA:  Feeding tube placement EXAM: PORTABLE ABDOMEN - 1 VIEW COMPARISON:  None. FINDINGS: Feeding tube extends the stomach. Tip at the gastric antrum. Gas-filled loops of small bowel distension. Bibasilar airspace disease. IMPRESSION: Feeding tube with tip at the gastric antrum. Electronically Signed   By: Suzy Bouchard M.D.   On: 06/12/2021 11:56   ECHOCARDIOGRAM COMPLETE  Result Date: 06/12/2021    ECHOCARDIOGRAM REPORT   Patient Name:   Veronica Romero Date of Exam: 06/12/2021 Medical Rec #:  [id number]       Height:       66.0 in Accession #:    [id number]      Weight:       143.3 lb Date of Birth:  17-Dec-1950      BSA:          1.736 m Patient Age:    57 years        BP:           198/80 mmHg Patient Gender: F               HR:           87 bpm. Exam Location:  Inpatient Procedure: 2D Echo, Cardiac Doppler, Color Doppler and Intracardiac             Opacification Agent  Results communicated to Dr Tacy Learn at 18:00 on 06/12/21. Indications:    Respiratory distress  History:        Patient has no prior history of Echocardiogram examinations.                 Signs/Symptoms:Chest Pain and Dyspnea.  Sonographer:    Jyl Heinz Referring Phys: [place]  1. Left ventricular ejection fraction, by estimation, is 25 to 30%. The left ventricle has severely decreased function. The left ventricle demonstrates regional wall motion abnormalities (see scoring diagram/findings  for description). Left ventricular diastolic parameters are consistent with Grade I diastolic dysfunction (impaired relaxation).  2. Right ventricular systolic function is mildly reduced. The right ventricular size is normal. There is mildly elevated pulmonary artery systolic pressure. The estimated right ventricular systolic pressure is 61.9 mmHg.  3. The mitral valve is normal in structure. Trivial mitral valve regurgitation. No evidence of mitral stenosis.  4. The aortic valve is tricuspid. Aortic valve regurgitation is mild. Aortic valve sclerosis/calcification is present, without any evidence of aortic stenosis.  5. The inferior vena cava is dilated in size with <50% respiratory variability, suggesting right atrial pressure of 15 mmHg. FINDINGS  Left Ventricle: Left ventricular ejection fraction, by estimation, is 25 to 30%. The left ventricle has severely decreased function. The left ventricle demonstrates regional wall motion abnormalities. The left ventricular internal cavity size was normal  in size. There is no left ventricular hypertrophy. Left ventricular diastolic parameters are consistent with Grade I diastolic dysfunction (impaired relaxation).  LV Wall Scoring: The mid and distal anterior wall, entire anterior septum, entire apex, mid and distal inferior wall, and mid inferoseptal segment are akinetic. The antero-lateral wall, posterior wall, basal anterior segment,  basal inferior segment, and basal inferoseptal segment are normal. Right Ventricle: The right ventricular size is normal. No increase in right ventricular wall thickness. Right ventricular systolic function is mildly reduced. There is mildly elevated pulmonary artery systolic pressure. The tricuspid regurgitant velocity  is 2.41 m/s, and with an assumed right atrial pressure of 15 mmHg, the estimated right ventricular systolic pressure is 50.9 mmHg. Left Atrium: Left atrial size was normal in size. Right Atrium: Right atrial size was normal in size. Pericardium: There is no evidence of pericardial effusion. Mitral Valve: The mitral valve is normal in structure. Trivial mitral valve regurgitation. No evidence of mitral valve stenosis. Tricuspid Valve: The tricuspid valve is normal in structure. Tricuspid valve regurgitation is mild. Aortic Valve: The aortic valve is tricuspid. Aortic valve regurgitation is mild. Aortic regurgitation PHT measures 448 msec. Aortic valve sclerosis/calcification is present, without any evidence of aortic stenosis. Aortic valve peak gradient measures 6.2  mmHg. Pulmonic Valve: The pulmonic valve was not well visualized. Pulmonic valve regurgitation is trivial. Aorta: The aortic root and ascending aorta are structurally normal, with no evidence of dilitation. Venous: The inferior vena cava is dilated in size with less than 50% respiratory variability, suggesting right atrial pressure of 15 mmHg. IAS/Shunts: The interatrial septum was not well visualized.  LEFT VENTRICLE PLAX 2D LVIDd:         5.00 cm      Diastology LVIDs:         3.60 cm      LV e' medial:    3.81 cm/s LV PW:         0.90 cm      LV E/e' medial:  12.8 LV IVS:        0.90 cm      LV e' lateral:   5.87 cm/s LVOT diam:     2.00 cm      LV E/e' lateral: 8.3 LV SV:         54 LV SV Index:   31 LVOT Area:     3.14 cm  LV Volumes (MOD) LV vol d, MOD A2C: 137.0 ml LV vol d, MOD A4C: 110.0 ml LV vol s, MOD A2C: 97.6 ml LV vol s,  MOD A4C: 80.2 ml LV SV MOD A2C:     39.4 ml  LV SV MOD A4C:     110.0 ml LV SV MOD BP:      34.2 ml RIGHT VENTRICLE            IVC RV Basal diam:  3.40 cm    IVC diam: 2.40 cm RV Mid diam:    2.30 cm RV S prime:     8.05 cm/s TAPSE (M-mode): 1.3 cm LEFT ATRIUM             Index        RIGHT ATRIUM           Index LA diam:        3.60 cm 2.07 cm/m   RA Area:     12.40 cm LA Vol (A2C):   54.5 ml 31.40 ml/m  RA Volume:   32.40 ml  18.67 ml/m LA Vol (A4C):   54.5 ml 31.40 ml/m LA Biplane Vol: 56.8 ml 32.73 ml/m  AORTIC VALVE AV Area (Vmax): 2.26 cm AV Vmax:        125.00 cm/s AV Peak Grad:   6.2 mmHg LVOT Vmax:      90.10 cm/s LVOT Vmean:     70.800 cm/s LVOT VTI:       0.171 m AI PHT:         448 msec  AORTA Ao Root diam: 3.20 cm Ao Asc diam:  2.70 cm MITRAL VALVE               TRICUSPID VALVE MV Area (PHT): 6.17 cm    TR Peak grad:   23.2 mmHg MV Decel Time: 123 msec    TR Vmax:        241.00 cm/s MV E velocity: 48.80 cm/s MV A velocity: 78.40 cm/s  SHUNTS MV E/A ratio:  0.62        Systemic VTI:  0.17 m                            Systemic Diam: 2.00 cm Oswaldo Milian MD Electronically signed by Oswaldo Milian MD Signature Date/Time: 06/12/2021/6:06:23 PM    Final     Microbiology: Recent Results (from the past 240 hour(s))  Acid Fast Smear (AFB)     Status: None   Collection Time: 06/12/21  3:30 PM   Specimen: Bronchoalveolar Lavage  Result Value Ref Range Status   AFB Specimen Processing Concentration  Final   Acid Fast Smear Negative  Final    Comment: (NOTE) Performed At: [facility] [place], [place] [id number] Rush Farmer MD WU:[id number]    Source (AFB) RIGHT UPPER LUNG BAL  Final    Comment: Performed at [facility], [place] [address] [place], [place] [address]  Culture, BAL-quantitative w Gram Stain     Status: None   Collection Time: 06/12/21  3:50 PM   Specimen: Bronchoalveolar Lavage; Respiratory  Result Value Ref Range Status   Specimen  Description BRONCHIAL ALVEOLAR LAVAGE  Final   Special Requests LEFT UPPER BAL  Final   Gram Stain   Final    RARE WBC PRESENT, PREDOMINANTLY PMN NO ORGANISMS SEEN    Culture   Final    NO GROWTH 2 DAYS Performed at [facility], [address], [place], [place] [address]    Report Status 06/14/2021 FINAL  Final  Culture, BAL-quantitative w Gram Stain     Status: None   Collection Time: 06/12/21  3:50 PM  Specimen: Bronchoalveolar Lavage; Respiratory  Result Value Ref Range Status   Specimen Description BRONCHIAL ALVEOLAR LAVAGE  Final   Special Requests RIGHT UPPER LUNG BAL  Final   Gram Stain   Final    FEW WBC PRESENT,BOTH PMN AND MONONUCLEAR NO ORGANISMS SEEN    Culture   Final    NO GROWTH 2 DAYS Performed at [facility], [address], [place], [place] [address]    Report Status 06/14/2021 FINAL  Final  Acid Fast Smear (AFB)     Status: None   Collection Time: 06/12/21  3:50 PM   Specimen: Bronchoalveolar Lavage  Result Value Ref Range Status   AFB Specimen Processing Concentration  Final   Acid Fast Smear Negative  Final    Comment: (NOTE) Performed At: [facility] [place], [place] [id number] Rush Farmer MD [id number]    Source (AFB) LEFT UPPER LUNG BAL  Final    Comment: Performed at [facility], [place] [address] [place], [place] [address]  Fungus Culture With Stain     Status: None (Preliminary result)   Collection Time: 06/12/21  3:50 PM  Result Value Ref Range Status   Fungus Stain Final report  Final    Comment: (NOTE) Performed At: [facility] [place], [place] [id number] Rush Farmer MD [id number]    Fungus (Mycology) Culture PENDING  Incomplete   Fungal Source BRONCHIAL ALVEOLAR LAVAGE  Final    Comment: RIGHT UPPER  Performed at [facility], [place] [address], [place], [place] [address]   Fungus Culture With Stain     Status: None (Preliminary result)   Collection  Time: 06/12/21  3:50 PM  Result Value Ref Range Status   Fungus Stain Final report  Final    Comment: (NOTE) Performed At: [facility] [place], [place] [id number] Rush Farmer MD [id number]    Fungus (Mycology) Culture PENDING  Incomplete   Fungal Source BRONCHIAL ALVEOLAR LAVAGE  Final    Comment: LEFT UPPER Performed at [facility], [place] [address], [place], [place] [address]   Fungus Culture Result     Status: None   Collection Time: 06/12/21  3:50 PM  Result Value Ref Range Status   Result 1 Comment  Final    Comment: (NOTE) KOH/Calcofluor preparation:  no fungus observed. Performed At: [facility] [place], [place] [id number] Rush Farmer MD [id number]   Fungus Culture Result     Status: None   Collection Time: 06/12/21  3:50 PM  Result Value Ref Range Status   Result 1 Comment  Final    Comment: (NOTE) KOH/Calcofluor preparation:  no fungus observed. Performed At: [facility] [place], [place] [id number] Rush Farmer MD [id number]      Labs: Basic Metabolic Panel: Recent Labs  Lab 06/16/21 0208 06/16/21 1001 06/17/21 0132 06/18/21 0059 06/19/21 0106 06/20/21 0100 06/21/21 0059 06/22/21 0107  NA 140   < > 138 138 136 137 135 132*  K 4.1   < > 3.3* 3.4* 4.0 4.2 3.9 3.6  CL 102  --  103 102 101 103 98 97*  CO2 30  --  _0 GLUCOSE 122*  --  120* 114* 98 90 107* 109*  BUN 17  --  _1 CREATININE 0.63  --  0.69 0.82 0.79 0.71 0.78 0.90  CALCIUM 8.3*  --  8.4* 8.5* 8.6* 8.7* 8.9 8.7*  MG 2.3  --  2.3 2.0 2.2 2.3 2.4  --   PHOS 3.1  --   --   --   --   --   --   --    < > = values in this interval not displayed.   Liver Function Tests: Recent Labs  Lab 06/17/21 0132 06/18/21 0059 06/19/21 0106 06/20/21 0100  AST 55* 49* 37 28  ALT 40 42 41 32  ALKPHOS 70 64 62 61  BILITOT 0.5 0.3 0.3 0.4  PROT 6.2* 6.4* 6.5 6.7  ALBUMIN 2.3* 2.4*  2.5* 2.7*   No results for input(s): LIPASE, AMYLASE in the last 168 hours. No results for input(s): AMMONIA in the last 168 hours. CBC: Recent Labs  Lab 06/18/21 0059 06/19/21 0106 06/20/21 0100 06/21/21 0059 06/22/21 0107  WBC 11.6* 11.4* 11.7* 10.5 11.3*  NEUTROABS 5.4 5.3 5.6 4.7 6.1  HGB 10.0* 9.9* 10.2* 10.9* 10.7*  HCT 31.1* 30.5* 31.8* 32.5* 31.7*  MCV 96.0 94.4 96.4 92.6 92.2  PLT 388 407* 389 417* 410*   Cardiac Enzymes: No results for input(s): CKTOTAL, CKMB, CKMBINDEX, TROPONINI in the last 168 hours. BNP: BNP (last 3 results) Recent Labs    06/19/21 0106 06/20/21 0100 06/21/21 0059  BNP 207.6* 171.8* 134.5*    ProBNP (last 3 results) No results for input(s): PROBNP in the last 8760 hours.  CBG: Recent Labs  Lab 06/21/21 0602 06/21/21 1114 06/21/21 1537 06/21/21 2137 06/22/21 0626  GLUCAP 87 93 115* 99 92       Signed:  Domenic Polite MD.  [facility] 06/22/2021, 12:17 PM

## 2021-06-22 NOTE — Progress Notes (Signed)
Patient given discharge instructions and stated understanding. 

## 2021-06-22 NOTE — Progress Notes (Signed)
Picked up discharge meds from [facility]. Meds were delivered to the patient's room in 2C03.

## 2021-06-22 NOTE — Plan of Care (Signed)

## 2021-06-22 NOTE — Progress Notes (Signed)
SATURATION QUALIFICATIONS: (This note is used to comply with regulatory documentation for home oxygen)  Patient Saturations on Room Air at Rest = 90%  Patient Saturations on Room Air while Ambulating = 84%  Patient Saturations on 4 Liters of oxygen while Ambulating = 93%  Please briefly explain why patient needs home oxygen:Patient sats drop to 84% while ambulating short distance of 20ft. Pt using rollator stopped to "catch her breath" for a couple of mins. Patient put on 4 L O2 after a few mins of rest was able to recover to 92% while ambulating.

## 2021-06-23 ENCOUNTER — Telehealth (HOSPITAL_COMMUNITY): Payer: Self-pay

## 2021-06-23 ENCOUNTER — Other Ambulatory Visit (HOSPITAL_COMMUNITY): Payer: Self-pay

## 2021-06-23 NOTE — Telephone Encounter (Signed)
Pharmacy Transitions of Care Follow-up Telephone Call  Date of discharge: 06/22/2021  Discharge Diagnosis: NSTEMI  How have you been since you were released from the hospital? Patient reports doing well   Medication changes made at discharge:  - START: Clopidogrel 75 mg daily  Medication changes verified by the patient? Yes (Yes/No)   Medication Accessibility:  Home Pharmacy: [facility] [address]   Was the patient provided with refills on discharged medications? No   Have all prescriptions been transferred from [facility] to home pharmacy? N/A   Is the patient able to afford medications? Patient has insurance   Medication Review:  CLOPIDOGREL (PLAVIX) Clopidogrel 75 mg once daily.  - Educated patient on expected duration of therapy of  with clopidogrel.  - Advised patient of medications to avoid (NSAIDs, ASA)  - Educated that Tylenol (acetaminophen) will be the preferred analgesic to prevent risk of bleeding  - Emphasized importance of monitoring for signs and symptoms of bleeding (abnormal bruising, prolonged bleeding, nose bleeds, bleeding from gums, discolored urine, black tarry stools)  - Advised patient to alert all providers of anticoagulation therapy prior to starting a new medication or having a procedure   Follow-up Appointments:  [facility] f/u appt confirmed? Cardiology Scheduled to see Dr. Harriet Masson on 07/13/2021 @ 9:40  If their condition worsens, is the pt aware to call PCP or go to the Emergency Dept.? Yes  Final Patient Assessment: Patient educated on Clopidogrel. She was not given refills for any medication though aware of follow up for future refills. No other questions or concerns.

## 2021-06-26 ENCOUNTER — Telehealth: Payer: Self-pay | Admitting: Cardiology

## 2021-06-26 NOTE — Telephone Encounter (Signed)
Physical therapist Kennyth Arnold from [facility] called to get clearance for patient's PT, wean oxygen, and get med for dry cough. Dr. Servando Salina recommended [facility] to contact pcp or pulmonologist due to oxygen therapy and respiratory issues not within Dr. Mallory Shirk scope of practice. Kennyth Arnold said she would find PCP for patient.

## 2021-06-26 NOTE — Telephone Encounter (Signed)
°  Stacy with [employer] called, she wanted to get an approval from Dr. Servando Salina for PT visit 2x a week, also she wanted to get an approval if she can have permission to wean off pt from oxygen. Also, she said, pt doesn't have pcp right and pt been coughing the entire time she's there today, its a dry hacking cough from having covid, she would like to ask if Dr. Servando Salina can prescribe something for that as well.

## 2021-06-26 NOTE — Telephone Encounter (Signed)
I received a phone request to adjust the patient's oxygen-it appears that the patient is on oxygen for chronic respiratory failure.  It appears that this was started in the hospital.  At this time it will be preferable for the patient to have her primary care doctor or pulmonary specialist managed titration of her oxygen.  Furthermore with her chronic respiratory failure and the patient will have not been evaluated posthospitalization this would be outside the scope of practice to adjust her oxygen.

## 2021-07-06 ENCOUNTER — Encounter (HOSPITAL_COMMUNITY): Payer: Self-pay | Admitting: Radiology

## 2021-07-07 DIAGNOSIS — M059 Rheumatoid arthritis with rheumatoid factor, unspecified: Principal | ICD-10-CM

## 2021-07-07 DIAGNOSIS — M25561 Pain in right knee: Principal | ICD-10-CM

## 2021-07-07 DIAGNOSIS — M05741 Rheumatoid arthritis with rheumatoid factor of right hand without organ or systems involvement: Principal | ICD-10-CM

## 2021-07-07 DIAGNOSIS — M05742 Rheumatoid arthritis with rheumatoid factor of left hand without organ or systems involvement: Principal | ICD-10-CM

## 2021-07-07 DIAGNOSIS — M05721 Rheumatoid arthritis with rheumatoid factor of right elbow without organ or systems involvement: Principal | ICD-10-CM

## 2021-07-07 DIAGNOSIS — M8589 Other specified disorders of bone density and structure, multiple sites: Principal | ICD-10-CM

## 2021-07-07 DIAGNOSIS — M05722 Rheumatoid arthritis with rheumatoid factor of left elbow without organ or systems involvement: Principal | ICD-10-CM

## 2021-07-07 DIAGNOSIS — M0579 Rheumatoid arthritis with rheumatoid factor of multiple sites without organ or systems involvement: Principal | ICD-10-CM

## 2021-07-07 DIAGNOSIS — M25461 Effusion, right knee: Principal | ICD-10-CM

## 2021-07-07 MED ORDER — PREDNISONE 5 MG TABLET
ORAL_TABLET | Freq: Every day | ORAL | 2 refills | 30 days | Status: CP
Start: 2021-07-07 — End: ?

## 2021-07-07 MED ORDER — TRAMADOL 50 MG TABLET
ORAL_TABLET | Freq: Four times a day (QID) | ORAL | 0 refills | 30 days | PRN
Start: 2021-07-07 — End: ?

## 2021-07-09 DIAGNOSIS — M05741 Rheumatoid arthritis with rheumatoid factor of right hand without organ or systems involvement: Principal | ICD-10-CM

## 2021-07-09 DIAGNOSIS — M05742 Rheumatoid arthritis with rheumatoid factor of left hand without organ or systems involvement: Principal | ICD-10-CM

## 2021-07-09 DIAGNOSIS — M25561 Pain in right knee: Principal | ICD-10-CM

## 2021-07-09 DIAGNOSIS — M059 Rheumatoid arthritis with rheumatoid factor, unspecified: Principal | ICD-10-CM

## 2021-07-09 DIAGNOSIS — M25461 Effusion, right knee: Principal | ICD-10-CM

## 2021-07-09 DIAGNOSIS — M0579 Rheumatoid arthritis with rheumatoid factor of multiple sites without organ or systems involvement: Principal | ICD-10-CM

## 2021-07-09 MED ORDER — TRAMADOL 50 MG TABLET
ORAL_TABLET | Freq: Four times a day (QID) | ORAL | 0 refills | 23 days | Status: CP | PRN
Start: 2021-07-09 — End: ?

## 2021-07-10 ENCOUNTER — Other Ambulatory Visit: Payer: Self-pay

## 2021-07-10 ENCOUNTER — Ambulatory Visit (INDEPENDENT_AMBULATORY_CARE_PROVIDER_SITE_OTHER): Payer: Medicare Other | Admitting: Nurse Practitioner

## 2021-07-10 ENCOUNTER — Encounter: Payer: Self-pay | Admitting: Nurse Practitioner

## 2021-07-10 VITALS — BP 114/65 | HR 76 | Temp 98.7°F | Ht 64.0 in | Wt 151.4 lb

## 2021-07-10 DIAGNOSIS — I700 Atherosclerosis of aorta: Secondary | ICD-10-CM | POA: Diagnosis not present

## 2021-07-10 DIAGNOSIS — I5023 Acute on chronic systolic (congestive) heart failure: Secondary | ICD-10-CM | POA: Diagnosis not present

## 2021-07-10 DIAGNOSIS — J9601 Acute respiratory failure with hypoxia: Secondary | ICD-10-CM | POA: Diagnosis not present

## 2021-07-10 DIAGNOSIS — Z7689 Persons encountering health services in other specified circumstances: Secondary | ICD-10-CM

## 2021-07-10 DIAGNOSIS — D649 Anemia, unspecified: Secondary | ICD-10-CM

## 2021-07-10 DIAGNOSIS — I2510 Atherosclerotic heart disease of native coronary artery without angina pectoris: Secondary | ICD-10-CM

## 2021-07-10 DIAGNOSIS — M069 Rheumatoid arthritis, unspecified: Secondary | ICD-10-CM | POA: Diagnosis not present

## 2021-07-10 DIAGNOSIS — K449 Diaphragmatic hernia without obstruction or gangrene: Secondary | ICD-10-CM | POA: Insufficient documentation

## 2021-07-10 DIAGNOSIS — Z712 Person consulting for explanation of examination or test findings: Secondary | ICD-10-CM

## 2021-07-10 NOTE — Assessment & Plan Note (Signed)
Hospitalized from 2/12-2/23.  Has follow up scheduled with Cardiology on Thursday this week. Will likely need stent for LAD stenosis. Discussed medications with patient during visit. Encouraged her to continue with current medications.  Follow up in 1 month for reevaluation. ?

## 2021-07-10 NOTE — Assessment & Plan Note (Signed)
Chronic. Diagnosed when she was 17.  On Prednisone 10mg  daily, Methotrexate injection weekly, and Humira Injection weekly.  Followed by Rheumatology at [facility].  Continue to follow per their recommendations. ?

## 2021-07-10 NOTE — Assessment & Plan Note (Signed)
Observed on CT scan.  Patient does endorse intermittent GI upset. Will refer patient to GI once she was seen Pulmonology and Cardiology. Patient understands and agrees with the plan of care.  ?

## 2021-07-10 NOTE — Assessment & Plan Note (Signed)
Observed on CT. Continue with ASA, Plavix and Crestor. Continue to follow up with Cardiology.  

## 2021-07-10 NOTE — Progress Notes (Signed)
BP 114/65    Pulse 76    Temp 98.7 F (37.1 C) (Oral)    Ht _0  (1.626 m)    Wt 151 lb 6.4 oz (68.7 kg)    SpO2 100%    BMI 25.99 kg/m    Subjective:    Patient ID: Veronica Romero, female    DOB: February 13, 1951, 71 y.o.   MRN: [id number]  HPI: Veronica Romero is a 71 y.o. female  Chief Complaint  Patient presents with   New Patient (Initial Visit)    Recent admission to [facility] for Pneumonia    Patient presents to clinic to establish care with new PCP.  Introduced to Designer, jewellery role and practice setting.  All questions answered.  Discussed provider/patient relationship and expectations.  Patient reports a history of Rheumatoid Arthritis, CAD, CHF.  She is currently on 2L which was new after the hospitalization.  She monitors them at home and when she is at rest she is able to maintain 92% and great at rest and drops down to the 80s when she is up and moving around.  She has not seen a Pulmonologist.  Has appointment with Cardiology on Thursday.  She is not a smoker and has never smoked.    Patient was recently hospitalized with acute respiratory failure secondary to COVID 19.  She had acute HF, NSTEMI,  and had a Cath that showed LAD.  Patient was discharged on Oxygen.  Denies HA, CP, dizziness, palpitations, visual changes.   Active Ambulatory Problems    Diagnosis Date Noted   Multifocal pneumonia 06/11/2021   Chest pain 06/11/2021   Dyspnea 06/11/2021   Rheumatoid arthritis ([place]) 06/11/2021   Acute respiratory failure with hypoxia and hypercapnia (HCC) 06/12/2021   Acute respiratory failure ([place]) 06/12/2021   Malnutrition of moderate degree 06/12/2021   Acute on chronic systolic heart failure ([place]) 06/16/2021   CAD (coronary artery disease), native coronary artery 06/16/2021   NSTEMI (non-ST elevated myocardial infarction) ([place]) 06/21/2021   Alcohol use 06/21/2021   Hiatal hernia 07/10/2021   Atherosclerosis of aorta ([place]) 07/10/2021   Resolved Ambulatory  Problems    Diagnosis Date Noted   No Resolved Ambulatory Problems   No Additional Past Medical History   Past Surgical History:  Procedure Laterality Date   RIGHT/LEFT HEART CATH AND CORONARY ANGIOGRAPHY N/A 06/16/2021   Procedure: RIGHT/LEFT HEART CATH AND CORONARY ANGIOGRAPHY;  Surgeon: Belva Crome, MD;  Location: [facility];  Service: Cardiovascular;  Laterality: N/A;   Active Ambulatory Problems    Diagnosis Date Noted   Multifocal pneumonia 06/11/2021   Chest pain 06/11/2021   Dyspnea 06/11/2021   Rheumatoid arthritis ([place]) 06/11/2021   Acute respiratory failure with hypoxia and hypercapnia (HCC) 06/12/2021   Acute respiratory failure ([place]) 06/12/2021   Malnutrition of moderate degree 06/12/2021   Acute on chronic systolic heart failure ([place]) 06/16/2021   CAD (coronary artery disease), native coronary artery 06/16/2021   NSTEMI (non-ST elevated myocardial infarction) ([place]) 06/21/2021   Alcohol use 06/21/2021   Hiatal hernia 07/10/2021   Atherosclerosis of aorta ([place]) 07/10/2021   Resolved Ambulatory Problems    Diagnosis Date Noted   No Resolved Ambulatory Problems   No Additional Past Medical History    Relevant past medical, surgical, family and social history reviewed and updated as indicated. Interim medical history since our last visit reviewed. Allergies and medications reviewed and updated.  Review of Systems  Eyes:  Negative for visual disturbance.  Respiratory:  Positive for shortness of breath. Negative for cough and chest tightness.   Cardiovascular:  Positive for leg swelling. Negative for chest pain and palpitations.  Neurological:  Negative for dizziness and headaches.   Per HPI unless specifically indicated above     Objective:    BP 114/65    Pulse 76    Temp 98.7 F (37.1 C) (Oral)    Ht _0  (1.626 m)    Wt 151 lb 6.4 oz (68.7 kg)    SpO2 100%    BMI 25.99 kg/m   Wt Readings from Last 3 Encounters:  07/10/21 151 lb 6.4 oz (68.7  kg)  06/22/21 150 lb 9.2 oz (68.3 kg)    Physical Exam Vitals and nursing note reviewed.  Constitutional:      General: She is not in acute distress.    Appearance: Normal appearance. She is normal weight. She is not ill-appearing, toxic-appearing or diaphoretic.  HENT:     Head: Normocephalic.     Right Ear: External ear normal.     Left Ear: External ear normal.     Nose: Nose normal.     Mouth/Throat:     Mouth: Mucous membranes are moist.     Pharynx: Oropharynx is clear.  Eyes:     General:        Right eye: No discharge.        Left eye: No discharge.     Extraocular Movements: Extraocular movements intact.     Conjunctiva/sclera: Conjunctivae normal.     Pupils: Pupils are equal, round, and reactive to light.  Cardiovascular:     Rate and Rhythm: Normal rate and regular rhythm.     Heart sounds: No murmur heard. Pulmonary:     Effort: Pulmonary effort is normal. No respiratory distress.     Breath sounds: Wheezing present. No rales.     Comments: On 2L home O2 Musculoskeletal:     Cervical back: Normal range of motion and neck supple.     Right lower leg: 1+ Edema (in ankles. Baseline for pt due to RA) present.     Left lower leg: 1+ Edema (in ankles. Baseline for pt due to RA) present.  Skin:    General: Skin is warm and dry.     Capillary Refill: Capillary refill takes less than 2 seconds.  Neurological:     General: No focal deficit present.     Mental Status: She is alert and oriented to person, place, and time. Mental status is at baseline.  Psychiatric:        Mood and Affect: Mood normal.        Behavior: Behavior normal.        Thought Content: Thought content normal.        Judgment: Judgment normal.    Results for orders placed or performed during the hospital encounter of 06/11/21  Resp Panel by RT-PCR (Flu A&B, Covid) Nasopharyngeal Swab   Specimen: Nasopharyngeal Swab; Nasopharyngeal(NP) swabs in vial transport medium  Result Value Ref Range    SARS Coronavirus 2 by RT PCR NEGATIVE NEGATIVE   Influenza A by PCR NEGATIVE NEGATIVE   Influenza B by PCR NEGATIVE NEGATIVE  Blood Culture (routine x 2)   Specimen: BLOOD LEFT HAND  Result Value Ref Range   Specimen Description BLOOD LEFT HAND    Special Requests      BOTTLES DRAWN AEROBIC AND ANAEROBIC Blood Culture adequate volume   Culture  NO GROWTH 5 DAYS Performed at [facility], [place] [address] [place], [place] [address]    Report Status 06/16/2021 FINAL   Blood Culture (routine x 2)   Specimen: BLOOD RIGHT FOREARM  Result Value Ref Range   Specimen Description BLOOD RIGHT FOREARM    Special Requests      BOTTLES DRAWN AEROBIC AND ANAEROBIC Blood Culture adequate volume   Culture      NO GROWTH 5 DAYS Performed at [facility], [place] [address], [place], [place] [address]    Report Status 06/16/2021 FINAL   Respiratory (~20 pathogens) panel by PCR   Specimen: Bronchoalveolar Lavage; Respiratory  Result Value Ref Range   Adenovirus NOT DETECTED NOT DETECTED   Coronavirus 229E NOT DETECTED NOT DETECTED   Coronavirus HKU1 NOT DETECTED NOT DETECTED   Coronavirus NL63 NOT DETECTED NOT DETECTED   Coronavirus OC43 NOT DETECTED NOT DETECTED   Metapneumovirus NOT DETECTED NOT DETECTED   Rhinovirus / Enterovirus NOT DETECTED NOT DETECTED   Influenza A NOT DETECTED NOT DETECTED   Influenza B NOT DETECTED NOT DETECTED   Parainfluenza Virus 1 NOT DETECTED NOT DETECTED   Parainfluenza Virus 2 NOT DETECTED NOT DETECTED   Parainfluenza Virus 3 NOT DETECTED NOT DETECTED   Parainfluenza Virus 4 NOT DETECTED NOT DETECTED   Respiratory Syncytial Virus NOT DETECTED NOT DETECTED   Bordetella pertussis NOT DETECTED NOT DETECTED   Bordetella Parapertussis NOT DETECTED NOT DETECTED   Chlamydophila pneumoniae NOT DETECTED NOT DETECTED   Mycoplasma pneumoniae NOT DETECTED NOT DETECTED  MRSA Next Gen by PCR, Nasal   Specimen: Nasal Mucosa; Nasal Swab  Result Value Ref Range    MRSA by PCR Next Gen NOT DETECTED NOT DETECTED  Culture, BAL-quantitative w Gram Stain   Specimen: Bronchoalveolar Lavage; Respiratory  Result Value Ref Range   Specimen Description BRONCHIAL ALVEOLAR LAVAGE    Special Requests LEFT UPPER BAL    Gram Stain      RARE WBC PRESENT, PREDOMINANTLY PMN NO ORGANISMS SEEN    Culture      NO GROWTH 2 DAYS Performed at [place] [address] [place], [place] [address]    Report Status 06/14/2021 FINAL   Culture, BAL-quantitative w Gram Stain   Specimen: Bronchoalveolar Lavage; Respiratory  Result Value Ref Range   Specimen Description BRONCHIAL ALVEOLAR LAVAGE    Special Requests RIGHT UPPER LUNG BAL    Gram Stain      FEW WBC PRESENT,BOTH PMN AND MONONUCLEAR NO ORGANISMS SEEN    Culture      NO GROWTH 2 DAYS Performed at [facility], [place] [address] [place], [place] [address]    Report Status 06/14/2021 FINAL   Acid Fast Smear (AFB)   Specimen: Bronchoalveolar Lavage  Result Value Ref Range   AFB Specimen Processing Concentration    Acid Fast Smear Negative    Source (AFB) RIGHT UPPER LUNG BAL   Acid Fast Smear (AFB)   Specimen: Bronchoalveolar Lavage  Result Value Ref Range   AFB Specimen Processing Concentration    Acid Fast Smear Negative    Source (AFB) LEFT UPPER LUNG BAL   Fungus Culture With Stain  Result Value Ref Range   Fungus Stain Final report    Fungus (Mycology) Culture PENDING    Fungal Source BRONCHIAL ALVEOLAR LAVAGE   Fungus Culture With Stain  Result Value Ref Range   Fungus Stain Final report    Fungus (Mycology) Culture PENDING  Fungal Source BRONCHIAL ALVEOLAR LAVAGE   Fungus Culture Result  Result Value Ref Range   Result 1 Comment   Fungus Culture Result  Result Value Ref Range   Result 1 Comment   Basic metabolic panel  Result Value Ref Range   Sodium 135 135 - 145 mmol/L   Potassium 3.5 3.5 - 5.1 mmol/L   Chloride 102 98 - 111 mmol/L   CO2 21 (L) 22 - 32 mmol/L    Glucose, Bld 113 (H) 70 - 99 mg/dL   BUN 8 8 - 23 mg/dL   Creatinine, Ser 0.77 0.44 - 1.00 mg/dL   Calcium 8.3 (L) 8.9 - 10.3 mg/dL   GFR, Estimated >60 >60 mL/min   Anion gap 12 5 - 15  CBC  Result Value Ref Range   WBC 9.8 4.0 - 10.5 K/uL   RBC 3.40 (L) 3.87 - 5.11 MIL/uL   Hemoglobin 10.8 (L) 12.0 - 15.0 g/dL   HCT 31.2 (L) 36.0 - 46.0 %   MCV 91.8 80.0 - 100.0 fL   MCH 31.8 26.0 - 34.0 pg   MCHC 34.6 30.0 - 36.0 g/dL   RDW 13.4 11.5 - 15.5 %   Platelets 366 150 - 400 K/uL   nRBC 0.0 0.0 - 0.2 %  Lactic acid, plasma  Result Value Ref Range   Lactic Acid, Venous 0.9 0.5 - 1.9 mmol/L  Protime-INR  Result Value Ref Range   Prothrombin Time 14.5 11.4 - 15.2 seconds   INR 1.1 0.8 - 1.2  APTT  Result Value Ref Range   aPTT 32 24 - 36 seconds  Brain natriuretic peptide  Result Value Ref Range   B Natriuretic Peptide 271.8 (H) 0.0 - 100.0 pg/mL  Procalcitonin - Baseline  Result Value Ref Range   Procalcitonin <0.10 ng/mL  Procalcitonin  Result Value Ref Range   Procalcitonin <0.10 ng/mL  CBC  Result Value Ref Range   WBC 23.1 (H) 4.0 - 10.5 K/uL   RBC 3.27 (L) 3.87 - 5.11 MIL/uL   Hemoglobin 10.4 (L) 12.0 - 15.0 g/dL   HCT 30.8 (L) 36.0 - 46.0 %   MCV 94.2 80.0 - 100.0 fL   MCH 31.8 26.0 - 34.0 pg   MCHC 33.8 30.0 - 36.0 g/dL   RDW 13.6 11.5 - 15.5 %   Platelets 392 150 - 400 K/uL   nRBC 0.0 0.0 - 0.2 %  Comprehensive metabolic panel  Result Value Ref Range   Sodium 139 135 - 145 mmol/L   Potassium 3.0 (L) 3.5 - 5.1 mmol/L   Chloride 104 98 - 111 mmol/L   CO2 24 22 - 32 mmol/L   Glucose, Bld 172 (H) 70 - 99 mg/dL   BUN 8 8 - 23 mg/dL   Creatinine, Ser 0.76 0.44 - 1.00 mg/dL   Calcium 7.9 (L) 8.9 - 10.3 mg/dL   Total Protein 6.7 6.5 - 8.1 g/dL   Albumin 2.8 (L) 3.5 - 5.0 g/dL   AST 116 (H) 15 - 41 U/L   ALT 35 0 - 44 U/L   Alkaline Phosphatase 117 38 - 126 U/L   Total Bilirubin 1.3 (H) 0.3 - 1.2 mg/dL   GFR, Estimated >60 >60 mL/min   Anion gap 11 5 - 15   Magnesium  Result Value Ref Range   Magnesium 1.8 1.7 - 2.4 mg/dL  Lactic acid, plasma  Result Value Ref Range   Lactic Acid, Venous 1.4 0.5 - 1.9 mmol/L  Procalcitonin  Result  Value Ref Range   Procalcitonin 1.21 ng/mL  Cortisol  Result Value Ref Range   Cortisol, Plasma 25.2 ug/dL  Urinalysis, Routine w reflex microscopic Urine, Catheterized  Result Value Ref Range   Color, Urine AMBER (A) YELLOW   APPearance CLOUDY (A) CLEAR   Specific Gravity, Urine 1.020 1.005 - 1.030   pH 5.0 5.0 - 8.0   Glucose, UA 50 (A) NEGATIVE mg/dL   Hgb urine dipstick NEGATIVE NEGATIVE   Bilirubin Urine NEGATIVE NEGATIVE   Ketones, ur 20 (A) NEGATIVE mg/dL   Protein, ur 30 (A) NEGATIVE mg/dL   Nitrite NEGATIVE NEGATIVE   Leukocytes,Ua NEGATIVE NEGATIVE   RBC / HPF 0-5 0 - 5 RBC/hpf   WBC, UA 0-5 0 - 5 WBC/hpf   Bacteria, UA RARE (A) NONE SEEN   Squamous Epithelial / LPF 0-5 0 - 5   Mucus PRESENT    Hyaline Casts, UA PRESENT    Granular Casts, UA PRESENT    Amorphous Crystal PRESENT   Strep pneumoniae urinary antigen  (not at [facility])  Result Value Ref Range   Strep Pneumo Urinary Antigen NEGATIVE NEGATIVE  Glucose, capillary  Result Value Ref Range   Glucose-Capillary 154 (H) 70 - 99 mg/dL  Hemoglobin A1c  Result Value Ref Range   Hgb A1c MFr Bld 5.4 4.8 - 5.6 %   Mean Plasma Glucose 108.28 mg/dL  Legionella Pneumophila Serogp 1 Ur Ag  Result Value Ref Range   L. pneumophila Serogp 1 Ur Ag Negative Negative   Source of Sample URINE, RANDOM   Basic metabolic panel  Result Value Ref Range   Sodium 137 135 - 145 mmol/L   Potassium 3.6 3.5 - 5.1 mmol/L   Chloride 105 98 - 111 mmol/L   CO2 22 22 - 32 mmol/L   Glucose, Bld 163 (H) 70 - 99 mg/dL   BUN 10 8 - 23 mg/dL   Creatinine, Ser 0.85 0.44 - 1.00 mg/dL   Calcium 8.0 (L) 8.9 - 10.3 mg/dL   GFR, Estimated >60 >60 mL/min   Anion gap 10 5 - 15  Phosphorus  Result Value Ref Range   Phosphorus 3.1 2.5 - 4.6 mg/dL  Fungitell, Serum   Result Value Ref Range   Fungitell Result 33 <80 pg/mL  Glucose, capillary  Result Value Ref Range   Glucose-Capillary 147 (H) 70 - 99 mg/dL  Procalcitonin  Result Value Ref Range   Procalcitonin 5.47 ng/mL  CBC  Result Value Ref Range   WBC 10.7 (H) 4.0 - 10.5 K/uL   RBC 3.15 (L) 3.87 - 5.11 MIL/uL   Hemoglobin 9.8 (L) 12.0 - 15.0 g/dL   HCT 29.2 (L) 36.0 - 46.0 %   MCV 92.7 80.0 - 100.0 fL   MCH 31.1 26.0 - 34.0 pg   MCHC 33.6 30.0 - 36.0 g/dL   RDW 13.6 11.5 - 15.5 %   Platelets 296 150 - 400 K/uL   nRBC 0.0 0.0 - 0.2 %  Basic metabolic panel  Result Value Ref Range   Sodium 138 135 - 145 mmol/L   Potassium 3.8 3.5 - 5.1 mmol/L   Chloride 108 98 - 111 mmol/L   CO2 21 (L) 22 - 32 mmol/L   Glucose, Bld 192 (H) 70 - 99 mg/dL   BUN 13 8 - 23 mg/dL   Creatinine, Ser 0.70 0.44 - 1.00 mg/dL   Calcium 8.2 (L) 8.9 - 10.3 mg/dL   GFR, Estimated >60 >60 mL/min   Anion gap 9  5 - 15  Magnesium  Result Value Ref Range   Magnesium 2.7 (H) 1.7 - 2.4 mg/dL  Triglycerides  Result Value Ref Range   Triglycerides 66 <150 mg/dL  Phosphorus  Result Value Ref Range   Phosphorus 2.3 (L) 2.5 - 4.6 mg/dL  Heparin level (unfractionated)  Result Value Ref Range   Heparin Unfractionated 0.31 0.30 - 0.70 IU/mL  Glucose, capillary  Result Value Ref Range   Glucose-Capillary 206 (H) 70 - 99 mg/dL  Glucose, capillary  Result Value Ref Range   Glucose-Capillary 181 (H) 70 - 99 mg/dL  Phosphorus  Result Value Ref Range   Phosphorus 3.8 2.5 - 4.6 mg/dL  Glucose, capillary  Result Value Ref Range   Glucose-Capillary 186 (H) 70 - 99 mg/dL  Glucose, capillary  Result Value Ref Range   Glucose-Capillary 161 (H) 70 - 99 mg/dL  Glucose, capillary  Result Value Ref Range   Glucose-Capillary 113 (H) 70 - 99 mg/dL  Glucose, capillary  Result Value Ref Range   Glucose-Capillary 160 (H) 70 - 99 mg/dL  Phosphorus  Result Value Ref Range   Phosphorus 2.9 2.5 - 4.6 mg/dL  Heparin level  (unfractionated)  Result Value Ref Range   Heparin Unfractionated 0.45 0.30 - 0.70 IU/mL  CBC  Result Value Ref Range   WBC 13.9 (H) 4.0 - 10.5 K/uL   RBC 3.08 (L) 3.87 - 5.11 MIL/uL   Hemoglobin 9.7 (L) 12.0 - 15.0 g/dL   HCT 28.6 (L) 36.0 - 46.0 %   MCV 92.9 80.0 - 100.0 fL   MCH 31.5 26.0 - 34.0 pg   MCHC 33.9 30.0 - 36.0 g/dL   RDW 13.8 11.5 - 15.5 %   Platelets 352 150 - 400 K/uL   nRBC 0.0 0.0 - 0.2 %  Basic metabolic panel  Result Value Ref Range   Sodium 141 135 - 145 mmol/L   Potassium 3.3 (L) 3.5 - 5.1 mmol/L   Chloride 106 98 - 111 mmol/L   CO2 25 22 - 32 mmol/L   Glucose, Bld 164 (H) 70 - 99 mg/dL   BUN 18 8 - 23 mg/dL   Creatinine, Ser 0.69 0.44 - 1.00 mg/dL   Calcium 7.6 (L) 8.9 - 10.3 mg/dL   GFR, Estimated >60 >60 mL/min   Anion gap 10 5 - 15  Glucose, capillary  Result Value Ref Range   Glucose-Capillary 190 (H) 70 - 99 mg/dL  Glucose, capillary  Result Value Ref Range   Glucose-Capillary 128 (H) 70 - 99 mg/dL  Glucose, capillary  Result Value Ref Range   Glucose-Capillary 149 (H) 70 - 99 mg/dL  Glucose, capillary  Result Value Ref Range   Glucose-Capillary 145 (H) 70 - 99 mg/dL  Glucose, capillary  Result Value Ref Range   Glucose-Capillary 103 (H) 70 - 99 mg/dL  Glucose, capillary  Result Value Ref Range   Glucose-Capillary 132 (H) 70 - 99 mg/dL  Heparin level (unfractionated)  Result Value Ref Range   Heparin Unfractionated 0.79 (H) 0.30 - 0.70 IU/mL  CBC  Result Value Ref Range   WBC 11.8 (H) 4.0 - 10.5 K/uL   RBC 2.96 (L) 3.87 - 5.11 MIL/uL   Hemoglobin 9.3 (L) 12.0 - 15.0 g/dL   HCT 27.2 (L) 36.0 - 46.0 %   MCV 91.9 80.0 - 100.0 fL   MCH 31.4 26.0 - 34.0 pg   MCHC 34.2 30.0 - 36.0 g/dL   RDW 13.7 11.5 - 15.5 %   Platelets  357 150 - 400 K/uL   nRBC 0.0 0.0 - 0.2 %  Basic metabolic panel  Result Value Ref Range   Sodium 143 135 - 145 mmol/L   Potassium 3.0 (L) 3.5 - 5.1 mmol/L   Chloride 102 98 - 111 mmol/L   CO2 31 22 - 32 mmol/L    Glucose, Bld 132 (H) 70 - 99 mg/dL   BUN 14 8 - 23 mg/dL   Creatinine, Ser 0.66 0.44 - 1.00 mg/dL   Calcium 7.8 (L) 8.9 - 10.3 mg/dL   GFR, Estimated >60 >60 mL/min   Anion gap 10 5 - 15  Magnesium  Result Value Ref Range   Magnesium 2.1 1.7 - 2.4 mg/dL  CK total and CKMB (cardiac)not at [facility]  Result Value Ref Range   Total CK 67 38 - 234 U/L   CK, MB 8.6 (H) 0.5 - 5.0 ng/mL   Relative Index RELATIVE INDEX IS INVALID 0.0 - 2.5  Brain natriuretic peptide  Result Value Ref Range   B Natriuretic Peptide 541.6 (H) 0.0 - 100.0 pg/mL  Glucose, capillary  Result Value Ref Range   Glucose-Capillary 160 (H) 70 - 99 mg/dL  Glucose, capillary  Result Value Ref Range   Glucose-Capillary 103 (H) 70 - 99 mg/dL  Glucose, capillary  Result Value Ref Range   Glucose-Capillary 134 (H) 70 - 99 mg/dL  Glucose, capillary  Result Value Ref Range   Glucose-Capillary 129 (H) 70 - 99 mg/dL  Glucose, capillary  Result Value Ref Range   Glucose-Capillary 123 (H) 70 - 99 mg/dL  CBC  Result Value Ref Range   WBC 11.8 (H) 4.0 - 10.5 K/uL   RBC 3.10 (L) 3.87 - 5.11 MIL/uL   Hemoglobin 9.5 (L) 12.0 - 15.0 g/dL   HCT 29.3 (L) 36.0 - 46.0 %   MCV 94.5 80.0 - 100.0 fL   MCH 30.6 26.0 - 34.0 pg   MCHC 32.4 30.0 - 36.0 g/dL   RDW 13.7 11.5 - 15.5 %   Platelets 343 150 - 400 K/uL   nRBC 0.0 0.0 - 0.2 %  Glucose, capillary  Result Value Ref Range   Glucose-Capillary 86 70 - 99 mg/dL  Basic metabolic panel  Result Value Ref Range   Sodium 140 135 - 145 mmol/L   Potassium 4.1 3.5 - 5.1 mmol/L   Chloride 102 98 - 111 mmol/L   CO2 30 22 - 32 mmol/L   Glucose, Bld 122 (H) 70 - 99 mg/dL   BUN 17 8 - 23 mg/dL   Creatinine, Ser 0.63 0.44 - 1.00 mg/dL   Calcium 8.3 (L) 8.9 - 10.3 mg/dL   GFR, Estimated >60 >60 mL/min   Anion gap 8 5 - 15  Magnesium  Result Value Ref Range   Magnesium 2.3 1.7 - 2.4 mg/dL  Phosphorus  Result Value Ref Range   Phosphorus 3.1 2.5 - 4.6 mg/dL  Glucose, capillary   Result Value Ref Range   Glucose-Capillary 121 (H) 70 - 99 mg/dL  Lipid panel  Result Value Ref Range   Cholesterol 177 0 - 200 mg/dL   Triglycerides 115 <150 mg/dL   HDL 38 (L) >40 mg/dL   Total CHOL/HDL Ratio 4.7 RATIO   VLDL 23 0 - 40 mg/dL   LDL Cholesterol 116 (H) 0 - 99 mg/dL  Glucose, capillary  Result Value Ref Range   Glucose-Capillary 97 70 - 99 mg/dL  Glucose, capillary  Result Value Ref Range   Glucose-Capillary 128 (H)  70 - 99 mg/dL  Magnesium  Result Value Ref Range   Magnesium 2.3 1.7 - 2.4 mg/dL  Comprehensive metabolic panel  Result Value Ref Range   Sodium 138 135 - 145 mmol/L   Potassium 3.3 (L) 3.5 - 5.1 mmol/L   Chloride 103 98 - 111 mmol/L   CO2 26 22 - 32 mmol/L   Glucose, Bld 120 (H) 70 - 99 mg/dL   BUN 14 8 - 23 mg/dL   Creatinine, Ser 0.69 0.44 - 1.00 mg/dL   Calcium 8.4 (L) 8.9 - 10.3 mg/dL   Total Protein 6.2 (L) 6.5 - 8.1 g/dL   Albumin 2.3 (L) 3.5 - 5.0 g/dL   AST 55 (H) 15 - 41 U/L   ALT 40 0 - 44 U/L   Alkaline Phosphatase 70 38 - 126 U/L   Total Bilirubin 0.5 0.3 - 1.2 mg/dL   GFR, Estimated >60 >60 mL/min   Anion gap 9 5 - 15  Brain natriuretic peptide  Result Value Ref Range   B Natriuretic Peptide 697.8 (H) 0.0 - 100.0 pg/mL  CBC with Differential/Platelet  Result Value Ref Range   WBC 11.1 (H) 4.0 - 10.5 K/uL   RBC 2.98 (L) 3.87 - 5.11 MIL/uL   Hemoglobin 9.3 (L) 12.0 - 15.0 g/dL   HCT 27.8 (L) 36.0 - 46.0 %   MCV 93.3 80.0 - 100.0 fL   MCH 31.2 26.0 - 34.0 pg   MCHC 33.5 30.0 - 36.0 g/dL   RDW 13.7 11.5 - 15.5 %   Platelets 364 150 - 400 K/uL   nRBC 0.0 0.0 - 0.2 %   Neutrophils Relative % 50 %   Neutro Abs 5.7 1.7 - 7.7 K/uL   Lymphocytes Relative 34 %   Lymphs Abs 3.8 0.7 - 4.0 K/uL   Monocytes Relative 10 %   Monocytes Absolute 1.1 (H) 0.1 - 1.0 K/uL   Eosinophils Relative 4 %   Eosinophils Absolute 0.4 0.0 - 0.5 K/uL   Basophils Relative 1 %   Basophils Absolute 0.1 0.0 - 0.1 K/uL   Immature Granulocytes 1 %    Abs Immature Granulocytes 0.12 (H) 0.00 - 0.07 K/uL  Glucose, capillary  Result Value Ref Range   Glucose-Capillary 127 (H) 70 - 99 mg/dL  Glucose, capillary  Result Value Ref Range   Glucose-Capillary 155 (H) 70 - 99 mg/dL   Comment 1 Notify RN   Glucose, capillary  Result Value Ref Range   Glucose-Capillary 130 (H) 70 - 99 mg/dL  Glucose, capillary  Result Value Ref Range   Glucose-Capillary 131 (H) 70 - 99 mg/dL  Magnesium  Result Value Ref Range   Magnesium 2.0 1.7 - 2.4 mg/dL  Comprehensive metabolic panel  Result Value Ref Range   Sodium 138 135 - 145 mmol/L   Potassium 3.4 (L) 3.5 - 5.1 mmol/L   Chloride 102 98 - 111 mmol/L   CO2 26 22 - 32 mmol/L   Glucose, Bld 114 (H) 70 - 99 mg/dL   BUN 11 8 - 23 mg/dL   Creatinine, Ser 0.82 0.44 - 1.00 mg/dL   Calcium 8.5 (L) 8.9 - 10.3 mg/dL   Total Protein 6.4 (L) 6.5 - 8.1 g/dL   Albumin 2.4 (L) 3.5 - 5.0 g/dL   AST 49 (H) 15 - 41 U/L   ALT 42 0 - 44 U/L   Alkaline Phosphatase 64 38 - 126 U/L   Total Bilirubin 0.3 0.3 - 1.2 mg/dL   GFR, Estimated >  60 >60 mL/min   Anion gap 10 5 - 15  Brain natriuretic peptide  Result Value Ref Range   B Natriuretic Peptide 437.0 (H) 0.0 - 100.0 pg/mL  CBC with Differential/Platelet  Result Value Ref Range   WBC 11.6 (H) 4.0 - 10.5 K/uL   RBC 3.24 (L) 3.87 - 5.11 MIL/uL   Hemoglobin 10.0 (L) 12.0 - 15.0 g/dL   HCT 31.1 (L) 36.0 - 46.0 %   MCV 96.0 80.0 - 100.0 fL   MCH 30.9 26.0 - 34.0 pg   MCHC 32.2 30.0 - 36.0 g/dL   RDW 13.8 11.5 - 15.5 %   Platelets 388 150 - 400 K/uL   nRBC 0.0 0.0 - 0.2 %   Neutrophils Relative % 45 %   Neutro Abs 5.4 1.7 - 7.7 K/uL   Lymphocytes Relative 35 %   Lymphs Abs 4.1 (H) 0.7 - 4.0 K/uL   Monocytes Relative 10 %   Monocytes Absolute 1.2 (H) 0.1 - 1.0 K/uL   Eosinophils Relative 7 %   Eosinophils Absolute 0.8 (H) 0.0 - 0.5 K/uL   Basophils Relative 1 %   Basophils Absolute 0.1 0.0 - 0.1 K/uL   Immature Granulocytes 2 %   Abs Immature Granulocytes  0.17 (H) 0.00 - 0.07 K/uL  Glucose, capillary  Result Value Ref Range   Glucose-Capillary 91 70 - 99 mg/dL  Glucose, capillary  Result Value Ref Range   Glucose-Capillary 120 (H) 70 - 99 mg/dL  C-reactive protein  Result Value Ref Range   CRP 2.1 (H) <1.0 mg/dL  Procalcitonin - Baseline  Result Value Ref Range   Procalcitonin <0.10 ng/mL  Glucose, capillary  Result Value Ref Range   Glucose-Capillary 160 (H) 70 - 99 mg/dL  Glucose, capillary  Result Value Ref Range   Glucose-Capillary 98 70 - 99 mg/dL  Magnesium  Result Value Ref Range   Magnesium 2.2 1.7 - 2.4 mg/dL  Comprehensive metabolic panel  Result Value Ref Range   Sodium 136 135 - 145 mmol/L   Potassium 4.0 3.5 - 5.1 mmol/L   Chloride 101 98 - 111 mmol/L   CO2 26 22 - 32 mmol/L   Glucose, Bld 98 70 - 99 mg/dL   BUN 11 8 - 23 mg/dL   Creatinine, Ser 0.79 0.44 - 1.00 mg/dL   Calcium 8.6 (L) 8.9 - 10.3 mg/dL   Total Protein 6.5 6.5 - 8.1 g/dL   Albumin 2.5 (L) 3.5 - 5.0 g/dL   AST 37 15 - 41 U/L   ALT 41 0 - 44 U/L   Alkaline Phosphatase 62 38 - 126 U/L   Total Bilirubin 0.3 0.3 - 1.2 mg/dL   GFR, Estimated >60 >60 mL/min   Anion gap 9 5 - 15  Brain natriuretic peptide  Result Value Ref Range   B Natriuretic Peptide 207.6 (H) 0.0 - 100.0 pg/mL  CBC with Differential/Platelet  Result Value Ref Range   WBC 11.4 (H) 4.0 - 10.5 K/uL   RBC 3.23 (L) 3.87 - 5.11 MIL/uL   Hemoglobin 9.9 (L) 12.0 - 15.0 g/dL   HCT 30.5 (L) 36.0 - 46.0 %   MCV 94.4 80.0 - 100.0 fL   MCH 30.7 26.0 - 34.0 pg   MCHC 32.5 30.0 - 36.0 g/dL   RDW 14.1 11.5 - 15.5 %   Platelets 407 (H) 150 - 400 K/uL   nRBC 0.0 0.0 - 0.2 %   Neutrophils Relative % 46 %   Neutro Abs 5.3  1.7 - 7.7 K/uL   Lymphocytes Relative 31 %   Lymphs Abs 3.5 0.7 - 4.0 K/uL   Monocytes Relative 11 %   Monocytes Absolute 1.3 (H) 0.1 - 1.0 K/uL   Eosinophils Relative 9 %   Eosinophils Absolute 1.1 (H) 0.0 - 0.5 K/uL   Basophils Relative 1 %   Basophils Absolute  0.1 0.0 - 0.1 K/uL   Immature Granulocytes 2 %   Abs Immature Granulocytes 0.17 (H) 0.00 - 0.07 K/uL  C-reactive protein  Result Value Ref Range   CRP 1.7 (H) <1.0 mg/dL  Procalcitonin  Result Value Ref Range   Procalcitonin <0.10 ng/mL  Glucose, capillary  Result Value Ref Range   Glucose-Capillary 96 70 - 99 mg/dL  Glucose, capillary  Result Value Ref Range   Glucose-Capillary 93 70 - 99 mg/dL  Glucose, capillary  Result Value Ref Range   Glucose-Capillary 123 (H) 70 - 99 mg/dL  Glucose, capillary  Result Value Ref Range   Glucose-Capillary 104 (H) 70 - 99 mg/dL  Magnesium  Result Value Ref Range   Magnesium 2.3 1.7 - 2.4 mg/dL  Comprehensive metabolic panel  Result Value Ref Range   Sodium 137 135 - 145 mmol/L   Potassium 4.2 3.5 - 5.1 mmol/L   Chloride 103 98 - 111 mmol/L   CO2 22 22 - 32 mmol/L   Glucose, Bld 90 70 - 99 mg/dL   BUN 13 8 - 23 mg/dL   Creatinine, Ser 0.71 0.44 - 1.00 mg/dL   Calcium 8.7 (L) 8.9 - 10.3 mg/dL   Total Protein 6.7 6.5 - 8.1 g/dL   Albumin 2.7 (L) 3.5 - 5.0 g/dL   AST 28 15 - 41 U/L   ALT 32 0 - 44 U/L   Alkaline Phosphatase 61 38 - 126 U/L   Total Bilirubin 0.4 0.3 - 1.2 mg/dL   GFR, Estimated >60 >60 mL/min   Anion gap 12 5 - 15  Brain natriuretic peptide  Result Value Ref Range   B Natriuretic Peptide 171.8 (H) 0.0 - 100.0 pg/mL  CBC with Differential/Platelet  Result Value Ref Range   WBC 11.7 (H) 4.0 - 10.5 K/uL   RBC 3.30 (L) 3.87 - 5.11 MIL/uL   Hemoglobin 10.2 (L) 12.0 - 15.0 g/dL   HCT 31.8 (L) 36.0 - 46.0 %   MCV 96.4 80.0 - 100.0 fL   MCH 30.9 26.0 - 34.0 pg   MCHC 32.1 30.0 - 36.0 g/dL   RDW 14.2 11.5 - 15.5 %   Platelets 389 150 - 400 K/uL   nRBC 0.0 0.0 - 0.2 %   Neutrophils Relative % 47 %   Neutro Abs 5.6 1.7 - 7.7 K/uL   Lymphocytes Relative 30 %   Lymphs Abs 3.5 0.7 - 4.0 K/uL   Monocytes Relative 12 %   Monocytes Absolute 1.4 (H) 0.1 - 1.0 K/uL   Eosinophils Relative 9 %   Eosinophils Absolute 1.0 (H)  0.0 - 0.5 K/uL   Basophils Relative 1 %   Basophils Absolute 0.1 0.0 - 0.1 K/uL   Immature Granulocytes 1 %   Abs Immature Granulocytes 0.15 (H) 0.00 - 0.07 K/uL  C-reactive protein  Result Value Ref Range   CRP 1.9 (H) <1.0 mg/dL  Glucose, capillary  Result Value Ref Range   Glucose-Capillary 105 (H) 70 - 99 mg/dL   Comment 1 Notify RN    Comment 2 Document in Chart   Procalcitonin  Result Value Ref Range  Procalcitonin <0.10 ng/mL  Glucose, capillary  Result Value Ref Range   Glucose-Capillary 93 70 - 99 mg/dL  Glucose, capillary  Result Value Ref Range   Glucose-Capillary 124 (H) 70 - 99 mg/dL   Comment 1 Notify RN    Comment 2 Document in Chart   Glucose, capillary  Result Value Ref Range   Glucose-Capillary 107 (H) 70 - 99 mg/dL   Comment 1 Notify RN    Comment 2 Document in Chart   C-reactive protein  Result Value Ref Range   CRP 2.3 (H) <1.0 mg/dL  Procalcitonin  Result Value Ref Range   Procalcitonin <0.10 ng/mL  Brain natriuretic peptide  Result Value Ref Range   B Natriuretic Peptide 134.5 (H) 0.0 - 100.0 pg/mL  CBC with Differential/Platelet  Result Value Ref Range   WBC 10.5 4.0 - 10.5 K/uL   RBC 3.51 (L) 3.87 - 5.11 MIL/uL   Hemoglobin 10.9 (L) 12.0 - 15.0 g/dL   HCT 32.5 (L) 36.0 - 46.0 %   MCV 92.6 80.0 - 100.0 fL   MCH 31.1 26.0 - 34.0 pg   MCHC 33.5 30.0 - 36.0 g/dL   RDW 14.2 11.5 - 15.5 %   Platelets 417 (H) 150 - 400 K/uL   nRBC 0.0 0.0 - 0.2 %   Neutrophils Relative % 45 %   Neutro Abs 4.7 1.7 - 7.7 K/uL   Lymphocytes Relative 34 %   Lymphs Abs 3.6 0.7 - 4.0 K/uL   Monocytes Relative 12 %   Monocytes Absolute 1.3 (H) 0.1 - 1.0 K/uL   Eosinophils Relative 7 %   Eosinophils Absolute 0.7 (H) 0.0 - 0.5 K/uL   Basophils Relative 1 %   Basophils Absolute 0.1 0.0 - 0.1 K/uL   Immature Granulocytes 1 %   Abs Immature Granulocytes 0.10 (H) 0.00 - 0.07 K/uL  Magnesium  Result Value Ref Range   Magnesium 2.4 1.7 - 2.4 mg/dL  Basic metabolic  panel  Result Value Ref Range   Sodium 135 135 - 145 mmol/L   Potassium 3.9 3.5 - 5.1 mmol/L   Chloride 98 98 - 111 mmol/L   CO2 26 22 - 32 mmol/L   Glucose, Bld 107 (H) 70 - 99 mg/dL   BUN 14 8 - 23 mg/dL   Creatinine, Ser 0.78 0.44 - 1.00 mg/dL   Calcium 8.9 8.9 - 10.3 mg/dL   GFR, Estimated >60 >60 mL/min   Anion gap 11 5 - 15  Glucose, capillary  Result Value Ref Range   Glucose-Capillary 116 (H) 70 - 99 mg/dL   Comment 1 Notify RN    Comment 2 Document in Chart   Glucose, capillary  Result Value Ref Range   Glucose-Capillary 87 70 - 99 mg/dL   Comment 1 Notify RN    Comment 2 Document in Chart   Glucose, capillary  Result Value Ref Range   Glucose-Capillary 93 70 - 99 mg/dL  Glucose, capillary  Result Value Ref Range   Glucose-Capillary 115 (H) 70 - 99 mg/dL  CBC with Differential/Platelet  Result Value Ref Range   WBC 11.3 (H) 4.0 - 10.5 K/uL   RBC 3.44 (L) 3.87 - 5.11 MIL/uL   Hemoglobin 10.7 (L) 12.0 - 15.0 g/dL   HCT 31.7 (L) 36.0 - 46.0 %   MCV 92.2 80.0 - 100.0 fL   MCH 31.1 26.0 - 34.0 pg   MCHC 33.8 30.0 - 36.0 g/dL   RDW 14.2 11.5 - 15.5 %  Platelets 410 (H) 150 - 400 K/uL   nRBC 0.0 0.0 - 0.2 %   Neutrophils Relative % 54 %   Neutro Abs 6.1 1.7 - 7.7 K/uL   Lymphocytes Relative 36 %   Lymphs Abs 4.1 (H) 0.7 - 4.0 K/uL   Monocytes Relative 9 %   Monocytes Absolute 1.0 0.1 - 1.0 K/uL   Eosinophils Relative 1 %   Eosinophils Absolute 0.1 0.0 - 0.5 K/uL   Basophils Relative 0 %   Basophils Absolute 0.0 0.0 - 0.1 K/uL   nRBC 0 0 /100 WBC   Abs Immature Granulocytes 0.00 0.00 - 0.07 K/uL  Basic metabolic panel  Result Value Ref Range   Sodium 132 (L) 135 - 145 mmol/L   Potassium 3.6 3.5 - 5.1 mmol/L   Chloride 97 (L) 98 - 111 mmol/L   CO2 24 22 - 32 mmol/L   Glucose, Bld 109 (H) 70 - 99 mg/dL   BUN 14 8 - 23 mg/dL   Creatinine, Ser 0.90 0.44 - 1.00 mg/dL   Calcium 8.7 (L) 8.9 - 10.3 mg/dL   GFR, Estimated >60 >60 mL/min   Anion gap 11 5 - 15   Digoxin level  Result Value Ref Range   Digoxin Level 0.3 (L) 0.8 - 2.0 ng/mL  Glucose, capillary  Result Value Ref Range   Glucose-Capillary 99 70 - 99 mg/dL   Comment 1 Notify RN    Comment 2 Document in Chart   Glucose, capillary  Result Value Ref Range   Glucose-Capillary 92 70 - 99 mg/dL   Comment 1 Notify RN    Comment 2 Document in Chart   I-STAT 7, (LYTES, BLD GAS, ICA, H+H)  Result Value Ref Range   pH, Arterial 7.155 (LL) 7.350 - 7.450   pCO2 arterial 72.5 (HH) 32.0 - 48.0 mmHg   pO2, Arterial 106 83.0 - 108.0 mmHg   Bicarbonate 24.7 20.0 - 28.0 mmol/L   TCO2 27 22 - 32 mmol/L   O2 Saturation 94.0 %   Acid-base deficit 4.0 (H) 0.0 - 2.0 mmol/L   Sodium 137 135 - 145 mmol/L   Potassium 3.3 (L) 3.5 - 5.1 mmol/L   Calcium, Ion 1.21 1.15 - 1.40 mmol/L   HCT 32.0 (L) 36.0 - 46.0 %   Hemoglobin 10.9 (L) 12.0 - 15.0 g/dL   Patient temperature 102.9 F    Collection site RADIAL, ALLEN'S TEST ACCEPTABLE    Drawn by RT    Sample type ARTERIAL    Comment NOTIFIED PHYSICIAN   I-STAT 7, (LYTES, BLD GAS, ICA, H+H)  Result Value Ref Range   pH, Arterial 7.451 (H) 7.350 - 7.450   pCO2 arterial 39.0 32.0 - 48.0 mmHg   pO2, Arterial 360 (H) 83.0 - 108.0 mmHg   Bicarbonate 26.6 20.0 - 28.0 mmol/L   TCO2 28 22 - 32 mmol/L   O2 Saturation 100.0 %   Acid-Base Excess 3.0 (H) 0.0 - 2.0 mmol/L   Sodium 137 135 - 145 mmol/L   Potassium 3.7 3.5 - 5.1 mmol/L   Calcium, Ion 1.13 (L) 1.15 - 1.40 mmol/L   HCT 27.0 (L) 36.0 - 46.0 %   Hemoglobin 9.2 (L) 12.0 - 15.0 g/dL   Patient temperature 102.9 F    Collection site RADIAL, ALLEN'S TEST ACCEPTABLE    Drawn by HIDE    Sample type ARTERIAL   I-STAT 7, (LYTES, BLD GAS, ICA, H+H)  Result Value Ref Range   pH, Arterial 7.471 (H) 7.35 -  7.45   pCO2 arterial 29.8 (L) 32 - 48 mmHg   pO2, Arterial 72 (L) 83 - 108 mmHg   Bicarbonate 22.0 20.0 - 28.0 mmol/L   TCO2 23 22 - 32 mmol/L   O2 Saturation 96.0 %   Acid-base deficit 1.0 0.0 - 2.0  mmol/L   Sodium 139 135 - 145 mmol/L   Potassium 3.7 3.5 - 5.1 mmol/L   Calcium, Ion 1.17 1.15 - 1.40 mmol/L   HCT 26.0 (L) 36.0 - 46.0 %   Hemoglobin 8.8 (L) 12.0 - 15.0 g/dL   Patient temperature 97.1 F    Collection site RADIAL, ALLEN'S TEST ACCEPTABLE    Drawn by RT    Sample type ARTERIAL   I-STAT 7, (LYTES, BLD GAS, ICA, H+H)  Result Value Ref Range   pH, Arterial 7.359 7.35 - 7.45   pCO2 arterial 41.8 32 - 48 mmHg   pO2, Arterial 88 83 - 108 mmHg   Bicarbonate 23.7 20.0 - 28.0 mmol/L   TCO2 25 22 - 32 mmol/L   O2 Saturation 97.0 %   Acid-base deficit 2.0 0.0 - 2.0 mmol/L   Sodium 140 135 - 145 mmol/L   Potassium 3.9 3.5 - 5.1 mmol/L   Calcium, Ion 1.14 (L) 1.15 - 1.40 mmol/L   HCT 34.0 (L) 36.0 - 46.0 %   Hemoglobin 11.6 (L) 12.0 - 15.0 g/dL   Patient temperature 97.6 F    Collection site RADIAL, ALLEN'S TEST ACCEPTABLE    Drawn by RT    Sample type ARTERIAL   I-STAT 7, (LYTES, BLD GAS, ICA, H+H)  Result Value Ref Range   pH, Arterial 7.513 (H) 7.35 - 7.45   pCO2 arterial 39.7 32 - 48 mmHg   pO2, Arterial 32 (LL) 83 - 108 mmHg   Bicarbonate 31.9 (H) 20.0 - 28.0 mmol/L   TCO2 33 (H) 22 - 32 mmol/L   O2 Saturation 68 %   Acid-Base Excess 8.0 (H) 0.0 - 2.0 mmol/L   Sodium 142 135 - 145 mmol/L   Potassium 3.4 (L) 3.5 - 5.1 mmol/L   Calcium, Ion 1.15 1.15 - 1.40 mmol/L   HCT 28.0 (L) 36.0 - 46.0 %   Hemoglobin 9.5 (L) 12.0 - 15.0 g/dL   Sample type ARTERIAL    Comment NOTIFIED PHYSICIAN   I-STAT 7, (LYTES, BLD GAS, ICA, H+H)  Result Value Ref Range   pH, Arterial 7.507 (H) 7.35 - 7.45   pCO2 arterial 38.0 32 - 48 mmHg   pO2, Arterial 33 (LL) 83 - 108 mmHg   Bicarbonate 30.1 (H) 20.0 - 28.0 mmol/L   TCO2 31 22 - 32 mmol/L   O2 Saturation 70 %   Acid-Base Excess 7.0 (H) 0.0 - 2.0 mmol/L   Sodium 142 135 - 145 mmol/L   Potassium 3.2 (L) 3.5 - 5.1 mmol/L   Calcium, Ion 1.10 (L) 1.15 - 1.40 mmol/L   HCT 27.0 (L) 36.0 - 46.0 %   Hemoglobin 9.2 (L) 12.0 - 15.0 g/dL    Sample type ARTERIAL    Comment NOTIFIED PHYSICIAN   I-STAT 7, (LYTES, BLD GAS, ICA, H+H)  Result Value Ref Range   pH, Arterial 7.518 (H) 7.35 - 7.45   pCO2 arterial 37.0 32 - 48 mmHg   pO2, Arterial 64 (L) 83 - 108 mmHg   Bicarbonate 30.1 (H) 20.0 - 28.0 mmol/L   TCO2 31 22 - 32 mmol/L   O2 Saturation 94 %   Acid-Base Excess 7.0 (H) 0.0 -  2.0 mmol/L   Sodium 141 135 - 145 mmol/L   Potassium 3.3 (L) 3.5 - 5.1 mmol/L   Calcium, Ion 1.15 1.15 - 1.40 mmol/L   HCT 27.0 (L) 36.0 - 46.0 %   Hemoglobin 9.2 (L) 12.0 - 15.0 g/dL   Sample type ARTERIAL   ECHOCARDIOGRAM COMPLETE  Result Value Ref Range   Weight 2,292.78 oz   Height 66 in   BP 80/69 mmHg   Single Plane A2C EF 28.8 %   Single Plane A4C EF 27.1 %   Calc EF 27.2 %   S' Lateral 3.60 cm   AR max vel 2.26 cm2   AV Peak grad 6.3 mmHg   Ao pk vel 1.25 m/s   P 1/2 time 448 msec   Area-P 1/2 6.17 cm2  Troponin I (High Sensitivity)  Result Value Ref Range   Troponin I (High Sensitivity) 25 (H) <18 ng/L  Troponin I (High Sensitivity)  Result Value Ref Range   Troponin I (High Sensitivity) 19 (H) <18 ng/L  Troponin I (High Sensitivity)  Result Value Ref Range   Troponin I (High Sensitivity) 768 (HH) <18 ng/L  Troponin I (High Sensitivity)  Result Value Ref Range   Troponin I (High Sensitivity) 3,044 (HH) <18 ng/L  Troponin I (High Sensitivity)  Result Value Ref Range   Troponin I (High Sensitivity) 1,806 (HH) <18 ng/L      Assessment & Plan:   Problem List Items Addressed This Visit       Cardiovascular and Mediastinum   Acute on chronic systolic heart failure ([place])    Hospitalized from 2/12-2/23.  Has follow up scheduled with Cardiology on Thursday this week. Will likely need stent for LAD stenosis. Discussed medications with patient during visit. Encouraged her to continue with current medications.  Follow up in 1 month for reevaluation.      Relevant Orders   Basic Metabolic Panel (BMET)   CBC w/Diff   CAD  (coronary artery disease), native coronary artery    Observed on CT. Continue with ASA, Plavix and Crestor. Continue to follow up with Cardiology.       Atherosclerosis of aorta ([place])    Observed on CT. Continue with ASA, Plavix and Crestor. Continue to follow up with Cardiology.         Respiratory   Acute respiratory failure ([place])    Recently hospitalization from 2/12- 06/22/2021.  Had Pneumonia secondary to COVID 19. Discharged on 2L home O2.  Patient monitors O2 at home >92% at rest and in the 80s with SOB when moving around.  Will continue O2 for patient until she is able to see Pulmonology.  Reviewed scans from the hospital, edema vs ground glass opacities observed.  Prominent lymph nodes noted on scan and repeat CT PET recommended in 2-3 months.  Will order at next visit.  Referral placed for Pulmonology.      Relevant Orders   Basic Metabolic Panel (BMET)   Ambulatory referral to Pulmonology     Musculoskeletal and Integument   Rheumatoid arthritis ([place]) - Primary    Chronic. Diagnosed when she was 28.  On Prednisone 86m daily, Methotrexate injection weekly, and Humira Injection weekly.  [facility]  Continue to follow per their recommendations.      Relevant Orders   CBC w/Diff     Other   Hiatal hernia    Observed on CT scan.  Patient does endorse intermittent GI upset. Will refer patient to GI once  she was seen Pulmonology and Cardiology. Patient understands and agrees with the plan of care.       Other Visit Diagnoses     Encounter to discuss test results       Reviewed results from imaging and labs with patient during visit and follow up recommendations.   Encounter to establish care            Follow up plan: Return in about 1 month (around 08/10/2021) for Med Management.

## 2021-07-10 NOTE — Assessment & Plan Note (Signed)
Recently hospitalization from 2/12- 06/22/2021.  Had Pneumonia secondary to COVID 19. Discharged on 2L home O2.  Patient monitors O2 at home >92% at rest and in the 80s with SOB when moving around.  Will continue O2 for patient until she is able to see Pulmonology.  Reviewed scans from the hospital, edema vs ground glass opacities observed.  Prominent lymph nodes noted on scan and repeat CT PET recommended in 2-3 months.  Will order at next visit.  Referral placed for Pulmonology. ?

## 2021-07-11 ENCOUNTER — Encounter: Payer: Self-pay | Admitting: Nurse Practitioner

## 2021-07-11 LAB — CBC WITH DIFFERENTIAL/PLATELET
Basophils Absolute: 0.1 10*3/uL (ref 0.0–0.2)
Basos: 0 %
EOS (ABSOLUTE): 0.2 10*3/uL (ref 0.0–0.4)
Eos: 2 %
Hematocrit: 26.7 % — ABNORMAL LOW (ref 34.0–46.6)
Hemoglobin: 8.6 g/dL — ABNORMAL LOW (ref 11.1–15.9)
Immature Grans (Abs): 0.1 10*3/uL (ref 0.0–0.1)
Immature Granulocytes: 1 %
Lymphocytes Absolute: 2.3 10*3/uL (ref 0.7–3.1)
Lymphs: 20 %
MCH: 30 pg (ref 26.6–33.0)
MCHC: 32.2 g/dL (ref 31.5–35.7)
MCV: 93 fL (ref 79–97)
Monocytes Absolute: 1 10*3/uL — ABNORMAL HIGH (ref 0.1–0.9)
Monocytes: 9 %
Neutrophils Absolute: 7.8 10*3/uL — ABNORMAL HIGH (ref 1.4–7.0)
Neutrophils: 68 %
Platelets: 328 10*3/uL (ref 150–450)
RBC: 2.87 x10E6/uL — ABNORMAL LOW (ref 3.77–5.28)
RDW: 13.6 % (ref 11.7–15.4)
WBC: 11.5 10*3/uL — ABNORMAL HIGH (ref 3.4–10.8)

## 2021-07-11 LAB — BASIC METABOLIC PANEL
BUN/Creatinine Ratio: 24 (ref 12–28)
BUN: 19 mg/dL (ref 8–27)
CO2: 23 mmol/L (ref 20–29)
Calcium: 9.7 mg/dL (ref 8.7–10.3)
Chloride: 103 mmol/L (ref 96–106)
Creatinine, Ser: 0.79 mg/dL (ref 0.57–1.00)
Glucose: 112 mg/dL — ABNORMAL HIGH (ref 70–99)
Potassium: 3.7 mmol/L (ref 3.5–5.2)
Sodium: 142 mmol/L (ref 134–144)
eGFR: 79 mL/min/{1.73_m2} (ref >59)

## 2021-07-11 NOTE — Addendum Note (Signed)
Addended by: Larae Grooms on: 07/11/2021 10:21 AM ? ? Modules accepted: Orders ? ?

## 2021-07-11 NOTE — Progress Notes (Signed)
Please let patient know that her complete blood count dropped since she was discharged.  I would like her to come back and repeat her labs including iron studies and a stool test.  Please verify that patient is not having dark stools, blood in her stool or in her urine.  I have placed these orders.  I would like her to come back and have this checked today.

## 2021-07-12 ENCOUNTER — Other Ambulatory Visit: Payer: Medicare Other

## 2021-07-12 ENCOUNTER — Other Ambulatory Visit: Payer: Self-pay

## 2021-07-12 DIAGNOSIS — D649 Anemia, unspecified: Secondary | ICD-10-CM

## 2021-07-12 LAB — FUNGAL ORGANISM REFLEX

## 2021-07-12 LAB — CBC WITH DIFFERENTIAL/PLATELET
Hematocrit: 29.8 % — ABNORMAL LOW (ref 34.0–46.6)
Hemoglobin: 9.9 g/dL — ABNORMAL LOW (ref 11.1–15.9)
Lymphocytes Absolute: 3.8 10*3/uL — ABNORMAL HIGH (ref 0.7–3.1)
Lymphs: 29 %
MCH: 29.8 pg (ref 26.6–33.0)
MCHC: 33.2 g/dL (ref 31.5–35.7)
MCV: 90 fL (ref 79–97)
MID (Absolute): 1.6 10*3/uL (ref 0.1–1.6)
MID: 12 %
Neutrophils Absolute: 7.9 10*3/uL — ABNORMAL HIGH (ref 1.4–7.0)
Neutrophils: 60 %
Platelets: 401 10*3/uL (ref 150–450)
RBC: 3.32 x10E6/uL — ABNORMAL LOW (ref 3.77–5.28)
RDW: 14.8 % (ref 11.7–15.4)
WBC: 13.3 10*3/uL — ABNORMAL HIGH (ref 3.4–10.8)

## 2021-07-12 LAB — URINALYSIS, ROUTINE W REFLEX MICROSCOPIC
Bilirubin, UA: NEGATIVE
Ketones, UA: NEGATIVE
Nitrite, UA: NEGATIVE
Protein,UA: NEGATIVE
RBC, UA: NEGATIVE
Specific Gravity, UA: 1.015 (ref 1.005–1.030)
Urobilinogen, Ur: 0.2 mg/dL (ref 0.2–1.0)
pH, UA: 6.5 (ref 5.0–7.5)

## 2021-07-12 LAB — MICROSCOPIC EXAMINATION: RBC, Urine: NONE SEEN /hpf (ref 0–2)

## 2021-07-12 LAB — FUNGUS CULTURE WITH STAIN

## 2021-07-12 LAB — FUNGUS CULTURE RESULT

## 2021-07-12 NOTE — Progress Notes (Signed)
Please let patient know that her hemoglobin and hematocrit improved.  This is a good sign.  Her white blood cell count is elevated likely due to her being on Prednisone.  I am awaiting for her other labs to come back and will let her know what the results are.  We are working on the oxygen and will keep her updated as we know more.

## 2021-07-13 ENCOUNTER — Ambulatory Visit (INDEPENDENT_AMBULATORY_CARE_PROVIDER_SITE_OTHER): Payer: Medicare Other | Admitting: Cardiology

## 2021-07-13 ENCOUNTER — Encounter: Payer: Self-pay | Admitting: Cardiology

## 2021-07-13 VITALS — BP 110/58 | HR 78 | Ht 65.0 in | Wt 148.0 lb

## 2021-07-13 DIAGNOSIS — I2510 Atherosclerotic heart disease of native coronary artery without angina pectoris: Secondary | ICD-10-CM | POA: Diagnosis not present

## 2021-07-13 DIAGNOSIS — I272 Pulmonary hypertension, unspecified: Secondary | ICD-10-CM

## 2021-07-13 DIAGNOSIS — R0609 Other forms of dyspnea: Secondary | ICD-10-CM | POA: Diagnosis not present

## 2021-07-13 DIAGNOSIS — J9601 Acute respiratory failure with hypoxia: Secondary | ICD-10-CM | POA: Diagnosis not present

## 2021-07-13 DIAGNOSIS — I255 Ischemic cardiomyopathy: Secondary | ICD-10-CM

## 2021-07-13 DIAGNOSIS — Z79899 Other long term (current) drug therapy: Secondary | ICD-10-CM

## 2021-07-13 DIAGNOSIS — E782 Mixed hyperlipidemia: Secondary | ICD-10-CM

## 2021-07-13 DIAGNOSIS — I5020 Unspecified systolic (congestive) heart failure: Secondary | ICD-10-CM

## 2021-07-13 LAB — IRON,TIBC AND FERRITIN PANEL
Ferritin: 196 ng/mL — ABNORMAL HIGH (ref 15–150)
Iron Saturation: 17 % (ref 15–55)
Iron: 54 ug/dL (ref 27–139)
Total Iron Binding Capacity: 312 ug/dL (ref 250–450)
UIBC: 258 ug/dL (ref 118–369)

## 2021-07-13 LAB — CBC WITH DIFFERENTIAL/PLATELET
Basophils Absolute: 0.1 10*3/uL (ref 0.0–0.2)
Basos: 1 %
EOS (ABSOLUTE): 0.5 10*3/uL — ABNORMAL HIGH (ref 0.0–0.4)
Eos: 4 %
Hematocrit: 29.5 % — ABNORMAL LOW (ref 34.0–46.6)
Hemoglobin: 9.7 g/dL — ABNORMAL LOW (ref 11.1–15.9)
Immature Grans (Abs): 0.1 10*3/uL (ref 0.0–0.1)
Immature Granulocytes: 1 %
Lymphocytes Absolute: 3.7 10*3/uL — ABNORMAL HIGH (ref 0.7–3.1)
Lymphs: 30 %
MCH: 30 pg (ref 26.6–33.0)
MCHC: 32.9 g/dL (ref 31.5–35.7)
MCV: 91 fL (ref 79–97)
Monocytes Absolute: 1.2 10*3/uL — ABNORMAL HIGH (ref 0.1–0.9)
Monocytes: 10 %
Neutrophils Absolute: 7 10*3/uL (ref 1.4–7.0)
Neutrophils: 54 %
Platelets: 377 10*3/uL (ref 150–450)
RBC: 3.23 x10E6/uL — ABNORMAL LOW (ref 3.77–5.28)
RDW: 13.6 % (ref 11.7–15.4)
WBC: 12.6 10*3/uL — ABNORMAL HIGH (ref 3.4–10.8)

## 2021-07-13 LAB — BASIC METABOLIC PANEL
BUN/Creatinine Ratio: 23 (ref 12–28)
BUN: 22 mg/dL (ref 8–27)
CO2: 26 mmol/L (ref 20–29)
Calcium: 9.2 mg/dL (ref 8.7–10.3)
Chloride: 97 mmol/L (ref 96–106)
Creatinine, Ser: 0.94 mg/dL (ref 0.57–1.00)
Glucose: 93 mg/dL (ref 70–99)
Potassium: 3.6 mmol/L (ref 3.5–5.2)
Sodium: 137 mmol/L (ref 134–144)
eGFR: 64 mL/min/{1.73_m2} (ref >59)

## 2021-07-13 LAB — MAGNESIUM: Magnesium: 2.3 mg/dL (ref 1.6–2.3)

## 2021-07-13 MED ORDER — FUROSEMIDE 40 MG PO TABS: 40.0000 mg | ORAL_TABLET | ORAL | 3 refills | Status: DC

## 2021-07-13 NOTE — Patient Instructions (Addendum)
Medication Instructions:  ?Your physician has recommended you make the following change in your medication:  ?START: Lasix 40 mg every other day ?*If you need a refill on your cardiac medications before your next appointment, please call your pharmacy* ? ? ?Lab Work: ?Your physician recommends that you return for lab work in:  ?TODAY: BMET, Section ?If you have labs (blood work) drawn today and your tests are completely normal, you will receive your results only by: ?MyChart Message (if you have MyChart) OR ?A paper copy in the mail ?If you have any lab test that is abnormal or we need to change your treatment, we will call you to review the results. ? ? ?Testing/Procedures: ?None ? ? ?Follow-Up: ?At [facility], you and your health needs are our priority.  As part of our continuing mission to provide you with exceptional heart care, we have created designated Provider Care Teams.  These Care Teams include your primary Cardiologist (physician) and Advanced Practice Providers (APPs -  Physician Assistants and Nurse Practitioners) who all work together to provide you with the care you need, when you need it. ? ?We recommend signing up for the patient portal called "MyChart".  Sign up information is provided on this After Visit Summary.  MyChart is used to connect with patients for Virtual Visits (Telemedicine).  Patients are able to view lab/test results, encounter notes, upcoming appointments, etc.  Non-urgent messages can be sent to your provider as well.   ?To learn more about what you can do with MyChart, go to [email or web address].   ? ?Your next appointment:   ?4 week(s) ? ?The format for your next appointment:   ?In Person ? ?Provider:   ?Berniece Salines, DO   ? ? ?Other Instructions ?  ?

## 2021-07-13 NOTE — Progress Notes (Signed)
?Cardiology Office Note:   ? ?Date:  07/13/2021  ? ?ID:  Veronica Romero, DOB 08/25/1950, MRN [id number] ? ?PCP:  Jon Billings, NP  ?Cardiologist:  Berniece Salines, DO  ?Electrophysiologist:  None  ? ?Referring MD: No ref. provider found  ? ?" I am doing ok" ? ?History of Present Illness:   ? ?Veronica Romero is a 71 y.o. female with a hx of recently diagnosed coronary artery disease with severe left anterior descending artery stenosis which was medically managed while she was hospitalized, plan for catheterization next week, mild pulmonary hypertension, rheumatoid arthritis on methotrexate prednisone and Humira, recent COVID-19 infection with associated acute hypoxic respiratory failure, community-acquired pneumonia and newly diagnosed heart failure with reduced ejection fraction/ischemic cardiomyopathy EF 25 to 30% with wall motion abnormalities. ? ?During her hospitalization on presentation she was admitted for acute hypoxic respiratory failure she was in the ICU underwent diagnostic bronchoscopy which was unrevealing.  She was then extubated within several days.  During that time she had an echocardiogram that showed newly depressed ejection fraction wall motion abnormalities.  A heart catheterization was performed showing coronary artery disease with LAD stenosis.  Given her acute infection PCI was not pursued.  Medical therapy was optimized.  She was discharged home with oxygen due to her acute hypoxic respiratory failure.  She is here today with her daughter. ? ?No chest pain but some shortness of breath and exertion.  She still appears to be deconditioned and is on home oxygen. ? ?Past Medical History:  ?Diagnosis Date  ? Rheumatoid arthritis ([place])   ? ? ?Past Surgical History:  ?Procedure Laterality Date  ? RIGHT/LEFT HEART CATH AND CORONARY ANGIOGRAPHY N/A 06/16/2021  ? Procedure: RIGHT/LEFT HEART CATH AND CORONARY ANGIOGRAPHY;  Surgeon: Belva Crome, MD;  Location: [facility];  Service:  Cardiovascular;  Laterality: N/A;  ? ? ?Current Medications: ?Current Meds  ?Medication Sig  ? Adalimumab (HUMIRA PEN) 40 MG/0.4ML PNKT Inject 0.4 mLs into the skin once a week. Tuesday  ? aspirin EC 81 MG EC tablet Take 1 tablet (81 mg total) by mouth daily. Swallow whole.  ? carvedilol (COREG) 3.125 MG tablet Take 1 tablet (3.125 mg total) by mouth 2 (two) times daily with a meal.  ? clopidogrel (PLAVIX) 75 MG tablet Take 1 tablet (75 mg total) by mouth daily.  ? dapagliflozin propanediol (FARXIGA) 10 MG TABS tablet Take 1 tablet (10 mg total) by mouth daily.  ? digoxin (LANOXIN) 0.125 MG tablet Take 1 tablet (0.125 mg total) by mouth daily.  ? furosemide (LASIX) 40 MG tablet Take 1 tablet (40 mg total) by mouth every other day.  ? leucovorin (WELLCOVORIN) 5 MG tablet Take 15 mg by mouth once a week. Saturdays  ? losartan (COZAAR) 25 MG tablet Take 1/2 tablet (12.5 mg total) by mouth daily.  ? methotrexate 50 MG/2ML injection Inject 3.4 mLs (85 mg total) into the muscle once a week. Hold methotrexate for 2 more weeks and then resume 3/10  ? predniSONE (DELTASONE) 5 MG tablet Take 10 mg by mouth daily.  ? rosuvastatin (CRESTOR) 20 MG tablet Take 1 tablet (20 mg total) by mouth daily.  ? spironolactone (ALDACTONE) 25 MG tablet Take 1/2 tablets (12.5 mg total) by mouth daily.  ? [DISCONTINUED] furosemide (LASIX) 40 MG tablet Take 1 tablet (40 mg total) by mouth daily.  ?  ? ?Allergies:   Penicillins  ? ?Social History  ? ?Socioeconomic History  ? Marital status: Married  ?  Spouse name: Not on file  ? Number of children: Not on file  ? Years of education: Not on file  ? Highest education level: Not on file  ?Occupational History  ? Not on file  ?Tobacco Use  ? Smoking status: Never  ? Smokeless tobacco: Never  ?Vaping Use  ? Vaping Use: Never used  ?Substance and Sexual Activity  ? Alcohol use: Yes  ? Drug use: Not Currently  ? Sexual activity: Not on file  ?Other Topics Concern  ? Not on file  ?Social History  Narrative  ? Not on file  ? ?Social Determinants of Health  ? ?Financial Resource Strain: Not on file  ?Food Insecurity: Not on file  ?Transportation Needs: Not on file  ?Physical Activity: Not on file  ?Stress: Not on file  ?Social Connections: Not on file  ?  ? ?Family History: ?The patient's family history includes Autoimmune disease in her daughter; Heart disease in her father; Kidney disease in her brother. ? ?ROS:   ?Review of Systems  ?Constitution: Negative for decreased appetite, fever and weight gain.  ?HENT: Negative for congestion, ear discharge, hoarse voice and sore throat.   ?Eyes: Negative for discharge, redness, vision loss in right eye and visual halos.  ?Cardiovascular: Negative for chest pain, dyspnea on exertion, leg swelling, orthopnea and palpitations.  ?Respiratory: Negative for cough, hemoptysis, shortness of breath and snoring.   ?Endocrine: Negative for heat intolerance and polyphagia.  ?Hematologic/Lymphatic: Negative for bleeding problem. Does not bruise/bleed easily.  ?Skin: Negative for flushing, nail changes, rash and suspicious lesions.  ?Musculoskeletal: Negative for arthritis, joint pain, muscle cramps, myalgias, neck pain and stiffness.  ?Gastrointestinal: Negative for abdominal pain, bowel incontinence, diarrhea and excessive appetite.  ?Genitourinary: Negative for decreased libido, genital sores and incomplete emptying.  ?Neurological: Negative for brief paralysis, focal weakness, headaches and loss of balance.  ?Psychiatric/Behavioral: Negative for altered mental status, depression and suicidal ideas.  ?Allergic/Immunologic: Negative for HIV exposure and persistent infections.  ? ? ?EKGs/Labs/Other Studies Reviewed:   ? ?The following studies were reviewed today: ? ? ?EKG: None today ? ?2D echo 2/23 ? 1. Left ventricular ejection fraction, by estimation, is 25 to 30%. The  ?left ventricle has severely decreased function. The left ventricle  ?demonstrates regional wall motion  abnormalities (see scoring  ?diagram/findings for description). Left ventricular  ?diastolic parameters are consistent with Grade I diastolic dysfunction  ?(impaired relaxation).  ? 2. Right ventricular systolic function is mildly reduced. The right  ?ventricular size is normal. There is mildly elevated pulmonary artery  ?systolic pressure. The estimated right ventricular systolic pressure is  ?0000000 mmHg.  ? 3. The mitral valve is normal in structure. Trivial mitral valve  ?regurgitation. No evidence of mitral stenosis.  ? 4. The aortic valve is tricuspid. Aortic valve regurgitation is mild.  ?Aortic valve sclerosis/calcification is present, without any evidence of  ?aortic stenosis.  ? 5. The inferior vena cava is dilated in size with <50% respiratory  ?variability, suggesting right atrial pressure of 15 mmHg.  ?  ?LHC/RHC 06/16/2021 ?  Ost LAD to Mid LAD lesion is 85% stenosed. ?  Prox RCA lesion is 50% stenosed. ?  LPAV lesion is 50% stenosed. ?  There is mild to moderate left ventricular systolic dysfunction. ?  LV end diastolic pressure is normal. ?  LV end diastolic pressure is normal. ?  The left ventricular ejection fraction is 35-45% by visual estimate. ?  Hemodynamic findings consistent with mild pulmonary hypertension. ?  ?  Cardiac Index 4.1 -normal ?  ?CONCLUSIONS: ?Calcified segmental 85% proximal LAD ?Dominant circumflex without significant obstruction.  Distally before the PDA there is segmental 50% narrowing. ?Left main is widely patent ?Right coronary was poorly visualized, is nondominant, and contains mid 50% stenosis. ?Low normal to mild systolic dysfunction with EF in the 35 to 45% range.  LVEDP 14 mmHg. ?Mild pulmonary hypertension with mean PA pressure 22 mmHg; mean wedge pressure 14 mmHg; right atrial mean 6 mmHg.  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 ? ?Recent Labs: ?06/20/2021: ALT 32 ?06/21/2021: B Natriuretic Peptide 134.5 ?07/12/2021: Hemoglobin 9.7; Platelets 377 ?07/13/2021: BUN 22;  Creatinine, Ser 0.94; Magnesium 2.3; Potassium 3.6; Sodium 137  ?Recent Lipid Panel ?   ?Component Value Date/Time  ? CHOL 177 06/16/2021 0208  ? TRIG 115 06/16/2021 0208  ? HDL 38 (L) 06/16/2021 0208  ? CHOLHDL 4.7 06/16/2021

## 2021-07-13 NOTE — H&P (View-Only) (Signed)
?Cardiology Office Note:   ? ?Date:  07/13/2021  ? ?ID:  Veronica Romero, DOB 07-Apr-1951, MRN [id number] ? ?PCP:  Jon Billings, NP  ?Cardiologist:  Berniece Salines, DO  ?Electrophysiologist:  None  ? ?Referring MD: No ref. provider found  ? ?" I am doing ok" ? ?History of Present Illness:   ? ?Veronica Romero is a 71 y.o. female with a hx of recently diagnosed coronary artery disease with severe left anterior descending artery stenosis which was medically managed while she was hospitalized, plan for catheterization next week, mild pulmonary hypertension, rheumatoid arthritis on methotrexate prednisone and Humira, recent COVID-19 infection with associated acute hypoxic respiratory failure, community-acquired pneumonia and newly diagnosed heart failure with reduced ejection fraction/ischemic cardiomyopathy EF 25 to 30% with wall motion abnormalities. ? ?During her hospitalization on presentation she was admitted for acute hypoxic respiratory failure she was in the ICU underwent diagnostic bronchoscopy which was unrevealing.  She was then extubated within several days.  During that time she had an echocardiogram that showed newly depressed ejection fraction wall motion abnormalities.  A heart catheterization was performed showing coronary artery disease with LAD stenosis.  Given her acute infection PCI was not pursued.  Medical therapy was optimized.  She was discharged home with oxygen due to her acute hypoxic respiratory failure.  She is here today with her daughter. ? ?No chest pain but some shortness of breath and exertion.  She still appears to be deconditioned and is on home oxygen. ? ?Past Medical History:  ?Diagnosis Date  ? Rheumatoid arthritis ([place])   ? ? ?Past Surgical History:  ?Procedure Laterality Date  ? RIGHT/LEFT HEART CATH AND CORONARY ANGIOGRAPHY N/A 06/16/2021  ? Procedure: RIGHT/LEFT HEART CATH AND CORONARY ANGIOGRAPHY;  Surgeon: Belva Crome, MD;  Location: [facility];  Service:  Cardiovascular;  Laterality: N/A;  ? ? ?Current Medications: ?Current Meds  ?Medication Sig  ? Adalimumab (HUMIRA PEN) 40 MG/0.4ML PNKT Inject 0.4 mLs into the skin once a week. Tuesday  ? aspirin EC 81 MG EC tablet Take 1 tablet (81 mg total) by mouth daily. Swallow whole.  ? carvedilol (COREG) 3.125 MG tablet Take 1 tablet (3.125 mg total) by mouth 2 (two) times daily with a meal.  ? clopidogrel (PLAVIX) 75 MG tablet Take 1 tablet (75 mg total) by mouth daily.  ? dapagliflozin propanediol (FARXIGA) 10 MG TABS tablet Take 1 tablet (10 mg total) by mouth daily.  ? digoxin (LANOXIN) 0.125 MG tablet Take 1 tablet (0.125 mg total) by mouth daily.  ? furosemide (LASIX) 40 MG tablet Take 1 tablet (40 mg total) by mouth every other day.  ? leucovorin (WELLCOVORIN) 5 MG tablet Take 15 mg by mouth once a week. Saturdays  ? losartan (COZAAR) 25 MG tablet Take 1/2 tablet (12.5 mg total) by mouth daily.  ? methotrexate 50 MG/2ML injection Inject 3.4 mLs (85 mg total) into the muscle once a week. Hold methotrexate for 2 more weeks and then resume 3/10  ? predniSONE (DELTASONE) 5 MG tablet Take 10 mg by mouth daily.  ? rosuvastatin (CRESTOR) 20 MG tablet Take 1 tablet (20 mg total) by mouth daily.  ? spironolactone (ALDACTONE) 25 MG tablet Take 1/2 tablets (12.5 mg total) by mouth daily.  ? [DISCONTINUED] furosemide (LASIX) 40 MG tablet Take 1 tablet (40 mg total) by mouth daily.  ?  ? ?Allergies:   Penicillins  ? ?Social History  ? ?Socioeconomic History  ? Marital status: Married  ?  Spouse name: Not on file  ? Number of children: Not on file  ? Years of education: Not on file  ? Highest education level: Not on file  ?Occupational History  ? Not on file  ?Tobacco Use  ? Smoking status: Never  ? Smokeless tobacco: Never  ?Vaping Use  ? Vaping Use: Never used  ?Substance and Sexual Activity  ? Alcohol use: Yes  ? Drug use: Not Currently  ? Sexual activity: Not on file  ?Other Topics Concern  ? Not on file  ?Social History  Narrative  ? Not on file  ? ?Social Determinants of Health  ? ?Financial Resource Strain: Not on file  ?Food Insecurity: Not on file  ?Transportation Needs: Not on file  ?Physical Activity: Not on file  ?Stress: Not on file  ?Social Connections: Not on file  ?  ? ?Family History: ?The patient's family history includes Autoimmune disease in her daughter; Heart disease in her father; Kidney disease in her brother. ? ?ROS:   ?Review of Systems  ?Constitution: Negative for decreased appetite, fever and weight gain.  ?HENT: Negative for congestion, ear discharge, hoarse voice and sore throat.   ?Eyes: Negative for discharge, redness, vision loss in right eye and visual halos.  ?Cardiovascular: Negative for chest pain, dyspnea on exertion, leg swelling, orthopnea and palpitations.  ?Respiratory: Negative for cough, hemoptysis, shortness of breath and snoring.   ?Endocrine: Negative for heat intolerance and polyphagia.  ?Hematologic/Lymphatic: Negative for bleeding problem. Does not bruise/bleed easily.  ?Skin: Negative for flushing, nail changes, rash and suspicious lesions.  ?Musculoskeletal: Negative for arthritis, joint pain, muscle cramps, myalgias, neck pain and stiffness.  ?Gastrointestinal: Negative for abdominal pain, bowel incontinence, diarrhea and excessive appetite.  ?Genitourinary: Negative for decreased libido, genital sores and incomplete emptying.  ?Neurological: Negative for brief paralysis, focal weakness, headaches and loss of balance.  ?Psychiatric/Behavioral: Negative for altered mental status, depression and suicidal ideas.  ?Allergic/Immunologic: Negative for HIV exposure and persistent infections.  ? ? ?EKGs/Labs/Other Studies Reviewed:   ? ?The following studies were reviewed today: ? ? ?EKG: None today ? ?2D echo 2/23 ? 1. Left ventricular ejection fraction, by estimation, is 25 to 30%. The  ?left ventricle has severely decreased function. The left ventricle  ?demonstrates regional wall motion  abnormalities (see scoring  ?diagram/findings for description). Left ventricular  ?diastolic parameters are consistent with Grade I diastolic dysfunction  ?(impaired relaxation).  ? 2. Right ventricular systolic function is mildly reduced. The right  ?ventricular size is normal. There is mildly elevated pulmonary artery  ?systolic pressure. The estimated right ventricular systolic pressure is  ?0000000 mmHg.  ? 3. The mitral valve is normal in structure. Trivial mitral valve  ?regurgitation. No evidence of mitral stenosis.  ? 4. The aortic valve is tricuspid. Aortic valve regurgitation is mild.  ?Aortic valve sclerosis/calcification is present, without any evidence of  ?aortic stenosis.  ? 5. The inferior vena cava is dilated in size with <50% respiratory  ?variability, suggesting right atrial pressure of 15 mmHg.  ?  ?LHC/RHC 06/16/2021 ?  Ost LAD to Mid LAD lesion is 85% stenosed. ?  Prox RCA lesion is 50% stenosed. ?  LPAV lesion is 50% stenosed. ?  There is mild to moderate left ventricular systolic dysfunction. ?  LV end diastolic pressure is normal. ?  LV end diastolic pressure is normal. ?  The left ventricular ejection fraction is 35-45% by visual estimate. ?  Hemodynamic findings consistent with mild pulmonary hypertension. ?  ?  Cardiac Index 4.1 -normal ?  ?CONCLUSIONS: ?Calcified segmental 85% proximal LAD ?Dominant circumflex without significant obstruction.  Distally before the PDA there is segmental 50% narrowing. ?Left main is widely patent ?Right coronary was poorly visualized, is nondominant, and contains mid 50% stenosis. ?Low normal to mild systolic dysfunction with EF in the 35 to 45% range.  LVEDP 14 mmHg. ?Mild pulmonary hypertension with mean PA pressure 22 mmHg; mean wedge pressure 14 mmHg; right atrial mean 6 mmHg.  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 ? ?Recent Labs: ?06/20/2021: ALT 32 ?06/21/2021: B Natriuretic Peptide 134.5 ?07/12/2021: Hemoglobin 9.7; Platelets 377 ?07/13/2021: BUN 22;  Creatinine, Ser 0.94; Magnesium 2.3; Potassium 3.6; Sodium 137  ?Recent Lipid Panel ?   ?Component Value Date/Time  ? CHOL 177 06/16/2021 0208  ? TRIG 115 06/16/2021 0208  ? HDL 38 (L) 06/16/2021 0208  ? CHOLHDL 4.7 06/16/2021

## 2021-07-14 ENCOUNTER — Other Ambulatory Visit (HOSPITAL_COMMUNITY): Payer: Self-pay

## 2021-07-14 ENCOUNTER — Telehealth: Payer: Self-pay | Admitting: *Deleted

## 2021-07-14 ENCOUNTER — Telehealth: Payer: Self-pay | Admitting: Cardiology

## 2021-07-14 MED ORDER — CLOPIDOGREL BISULFATE 75 MG PO TABS
75.0000 mg | ORAL_TABLET | Freq: Every day | ORAL | 0 refills | Status: DC
  Filled 2021-07-14: qty 30, 30d supply, fill #0

## 2021-07-14 MED ORDER — DIGOXIN 125 MCG PO TABS
0.1250 mg | ORAL_TABLET | Freq: Every day | ORAL | 0 refills | Status: DC
  Filled 2021-07-14: qty 30, 30d supply, fill #0

## 2021-07-14 MED ORDER — CARVEDILOL 3.125 MG PO TABS
3.1250 mg | ORAL_TABLET | Freq: Two times a day (BID) | ORAL | 0 refills | Status: DC
  Filled 2021-07-14: qty 60, 30d supply, fill #0

## 2021-07-14 MED ORDER — LOSARTAN POTASSIUM 25 MG PO TABS
12.5000 mg | ORAL_TABLET | Freq: Every day | ORAL | 0 refills | Status: DC
  Filled 2021-07-14: qty 30, 60d supply, fill #0

## 2021-07-14 MED ORDER — ROSUVASTATIN CALCIUM 20 MG PO TABS
20.0000 mg | ORAL_TABLET | Freq: Every day | ORAL | 0 refills | Status: DC
  Filled 2021-07-14: qty 30, 30d supply, fill #0

## 2021-07-14 MED ORDER — SPIRONOLACTONE 25 MG PO TABS
12.5000 mg | ORAL_TABLET | Freq: Every day | ORAL | 0 refills | Status: DC
  Filled 2021-07-14: qty 30, 60d supply, fill #0

## 2021-07-14 NOTE — Telephone Encounter (Signed)
?*  STAT* If patient is at the pharmacy, call can be transferred to refill team. ? ? ?1. Which medications need to be refilled? (please list name of each medication and dose if known)  ?spironolactone (ALDACTONE) 25 MG tablet ?rosuvastatin (CRESTOR) 20 MG tablet ?losartan (COZAAR) 25 MG tablet ?digoxin (LANOXIN) 0.125 MG tablet ?clopidogrel (PLAVIX) 75 MG tablet ?carvedilol (COREG) 3.125 MG tablet ? ?2. Which pharmacy/location (including street and city if local pharmacy) is medication to be sent to? ?[facility] ? ?3. Do they need a 30 day or 90 day supply? 30 with refills ? ?Patient would like to only get 30 days to start to see what her insurance will cover. Please also let [facility] know that the patient would like her medications mailed to her  ?

## 2021-07-14 NOTE — Telephone Encounter (Signed)
Coronary Atherectomy scheduled at [facility] for: Monday July 17, 2021 7:30 AM ?Arrival time and place: [place] Entrance A at: 5:30 AM ? ?No solid food after midnight prior to cath, clear liquids until 5 AM day of procedure. ? ?Medication instructions: ?-Hold: ? Lasix/Spironolactone/Farxiga-AM of procedure  ?-Except hold medications usual morning medications can be taken with sips of water including aspirin 81 mg and Plavix 75 mg. ? ?Confirmed patient has responsible adult to drive home post procedure and be with patient first 24 hours after arriving home: ? ?One visitor is allowed to stay in the waiting room during the time you are at the hospital for your procedure.  ? ?Patient reports no symptoms concerning for COVID-19/no exposure to COVID-19 in the past 10 days. ? ?Reviewed procedure instructions with patient. ?

## 2021-07-14 NOTE — Telephone Encounter (Signed)
Refills sent to [facility]. ?

## 2021-07-17 ENCOUNTER — Ambulatory Visit (HOSPITAL_COMMUNITY)
Admission: RE | Disposition: A | Discharge status: Home / Self Care | Payer: Medicare Other | Source: Home / Self Care | Attending: Cardiology

## 2021-07-17 ENCOUNTER — Encounter (HOSPITAL_COMMUNITY): Payer: Self-pay | Admitting: Cardiology

## 2021-07-17 ENCOUNTER — Other Ambulatory Visit: Payer: Self-pay

## 2021-07-17 ENCOUNTER — Ambulatory Visit (HOSPITAL_COMMUNITY)
Admission: RE | Admit: 2021-07-17 | Discharge: 2021-07-18 | Disposition: A | Discharge status: Home / Self Care | Payer: Medicare Other | Attending: Cardiology | Admitting: Cardiology

## 2021-07-17 DIAGNOSIS — Z7984 Long term (current) use of oral hypoglycemic drugs: Secondary | ICD-10-CM | POA: Diagnosis not present

## 2021-07-17 DIAGNOSIS — Z8616 Personal history of COVID-19: Secondary | ICD-10-CM | POA: Insufficient documentation

## 2021-07-17 DIAGNOSIS — I2722 Pulmonary hypertension due to left heart disease: Secondary | ICD-10-CM | POA: Diagnosis not present

## 2021-07-17 DIAGNOSIS — Z7952 Long term (current) use of systemic steroids: Secondary | ICD-10-CM | POA: Insufficient documentation

## 2021-07-17 DIAGNOSIS — I2510 Atherosclerotic heart disease of native coronary artery without angina pectoris: Secondary | ICD-10-CM | POA: Diagnosis present

## 2021-07-17 DIAGNOSIS — E782 Mixed hyperlipidemia: Secondary | ICD-10-CM | POA: Diagnosis not present

## 2021-07-17 DIAGNOSIS — J9602 Acute respiratory failure with hypercapnia: Secondary | ICD-10-CM | POA: Diagnosis not present

## 2021-07-17 DIAGNOSIS — Z7982 Long term (current) use of aspirin: Secondary | ICD-10-CM | POA: Diagnosis not present

## 2021-07-17 DIAGNOSIS — J9601 Acute respiratory failure with hypoxia: Secondary | ICD-10-CM | POA: Diagnosis not present

## 2021-07-17 DIAGNOSIS — I5022 Chronic systolic (congestive) heart failure: Secondary | ICD-10-CM | POA: Insufficient documentation

## 2021-07-17 DIAGNOSIS — I255 Ischemic cardiomyopathy: Secondary | ICD-10-CM | POA: Diagnosis not present

## 2021-07-17 DIAGNOSIS — I214 Non-ST elevation (NSTEMI) myocardial infarction: Secondary | ICD-10-CM | POA: Diagnosis not present

## 2021-07-17 DIAGNOSIS — Z7902 Long term (current) use of antithrombotics/antiplatelets: Secondary | ICD-10-CM | POA: Insufficient documentation

## 2021-07-17 DIAGNOSIS — M069 Rheumatoid arthritis, unspecified: Secondary | ICD-10-CM | POA: Diagnosis present

## 2021-07-17 DIAGNOSIS — Z79899 Other long term (current) drug therapy: Secondary | ICD-10-CM | POA: Insufficient documentation

## 2021-07-17 DIAGNOSIS — I25110 Atherosclerotic heart disease of native coronary artery with unstable angina pectoris: Secondary | ICD-10-CM | POA: Insufficient documentation

## 2021-07-17 DIAGNOSIS — Z955 Presence of coronary angioplasty implant and graft: Secondary | ICD-10-CM | POA: Diagnosis not present

## 2021-07-17 DIAGNOSIS — R079 Chest pain, unspecified: Secondary | ICD-10-CM | POA: Diagnosis present

## 2021-07-17 HISTORY — PX: CORONARY ATHERECTOMY: CATH118238

## 2021-07-17 LAB — POCT ACTIVATED CLOTTING TIME
Activated Clotting Time: 251 seconds
Activated Clotting Time: 257 seconds
Activated Clotting Time: 263 seconds
Activated Clotting Time: 293 seconds

## 2021-07-17 LAB — CBC
HCT: 28 % — ABNORMAL LOW (ref 36.0–46.0)
Hemoglobin: 9.5 g/dL — ABNORMAL LOW (ref 12.0–15.0)
MCH: 30.5 pg (ref 26.0–34.0)
MCHC: 33.9 g/dL (ref 30.0–36.0)
MCV: 90 fL (ref 80.0–100.0)
Platelets: 440 10*3/uL — ABNORMAL HIGH (ref 150–400)
RBC: 3.11 MIL/uL — ABNORMAL LOW (ref 3.87–5.11)
RDW: 14.7 % (ref 11.5–15.5)
WBC: 14.5 10*3/uL — ABNORMAL HIGH (ref 4.0–10.5)
nRBC: 0 % (ref 0.0–0.2)

## 2021-07-17 SURGERY — CORONARY ATHERECTOMY
Anesthesia: LOCAL

## 2021-07-17 MED ORDER — NITROGLYCERIN 1 MG/10 ML FOR IR/CATH LAB
INTRA_ARTERIAL | Status: AC
  Filled 2021-07-17: qty 10

## 2021-07-17 MED ORDER — SODIUM CHLORIDE 0.9% FLUSH
3.0000 mL | Freq: Two times a day (BID) | INTRAVENOUS | Status: DC
  Administered 2021-07-17: 3 mL via INTRAVENOUS

## 2021-07-17 MED ORDER — ASPIRIN 81 MG PO CHEW: 81.0000 mg | CHEWABLE_TABLET | ORAL | Status: DC

## 2021-07-17 MED ORDER — SPIRONOLACTONE 12.5 MG HALF TABLET
12.5000 mg | ORAL_TABLET | Freq: Every day | ORAL | Status: DC
  Administered 2021-07-18: 12.5 mg via ORAL
  Filled 2021-07-17: qty 1

## 2021-07-17 MED ORDER — CLOPIDOGREL BISULFATE 75 MG PO TABS
75.0000 mg | ORAL_TABLET | Freq: Every day | ORAL | Status: DC
Start: 2021-07-17 — End: 2021-07-17

## 2021-07-17 MED ORDER — VERAPAMIL HCL 2.5 MG/ML IV SOLN
INTRAVENOUS | Status: AC
  Filled 2021-07-17: qty 2

## 2021-07-17 MED ORDER — LOSARTAN POTASSIUM 25 MG PO TABS
12.5000 mg | ORAL_TABLET | Freq: Every day | ORAL | Status: DC
  Administered 2021-07-18: 12.5 mg via ORAL
  Filled 2021-07-17: qty 1

## 2021-07-17 MED ORDER — SODIUM CHLORIDE 0.9 % IV SOLN: INTRAVENOUS | Status: DC

## 2021-07-17 MED ORDER — ROSUVASTATIN CALCIUM 20 MG PO TABS
20.0000 mg | ORAL_TABLET | Freq: Every day | ORAL | Status: DC
  Administered 2021-07-18: 20 mg via ORAL
  Filled 2021-07-17: qty 1

## 2021-07-17 MED ORDER — FENTANYL CITRATE (PF) 100 MCG/2ML IJ SOLN
INTRAMUSCULAR | Status: DC | PRN
  Administered 2021-07-17: 25 ug via INTRAVENOUS

## 2021-07-17 MED ORDER — LIDOCAINE HCL (PF) 1 % IJ SOLN
INTRAMUSCULAR | Status: DC | PRN
  Administered 2021-07-17: 2 mL

## 2021-07-17 MED ORDER — TRAMADOL HCL 50 MG PO TABS
50.0000 mg | ORAL_TABLET | Freq: Four times a day (QID) | ORAL | Status: DC | PRN
  Administered 2021-07-17 – 2021-07-18 (×2): 50 mg via ORAL
  Filled 2021-07-17 (×2): qty 1

## 2021-07-17 MED ORDER — HEPARIN (PORCINE) IN NACL 1000-0.9 UT/500ML-% IV SOLN
INTRAVENOUS | Status: DC | PRN
  Administered 2021-07-17 (×2): 500 mL

## 2021-07-17 MED ORDER — DAPAGLIFLOZIN PROPANEDIOL 10 MG PO TABS
10.0000 mg | ORAL_TABLET | Freq: Every day | ORAL | Status: DC
  Administered 2021-07-18: 10 mg via ORAL
  Filled 2021-07-17: qty 1

## 2021-07-17 MED ORDER — SODIUM CHLORIDE 0.9% FLUSH: 3.0000 mL | Freq: Two times a day (BID) | INTRAVENOUS | Status: DC

## 2021-07-17 MED ORDER — SODIUM CHLORIDE 0.9 % IV SOLN: 250.0000 mL | INTRAVENOUS | Status: DC | PRN

## 2021-07-17 MED ORDER — VITAMIN D 25 MCG (1000 UNIT) PO TABS
1000.0000 [IU] | ORAL_TABLET | Freq: Every day | ORAL | Status: DC
Start: 2021-07-18 — End: 2021-07-18
  Administered 2021-07-18: 1000 [IU] via ORAL
  Filled 2021-07-17: qty 1

## 2021-07-17 MED ORDER — MIDAZOLAM HCL 2 MG/2ML IJ SOLN
INTRAMUSCULAR | Status: DC | PRN
  Administered 2021-07-17: 1 mg via INTRAVENOUS

## 2021-07-17 MED ORDER — HEPARIN SODIUM (PORCINE) 1000 UNIT/ML IJ SOLN
INTRAMUSCULAR | Status: AC
  Filled 2021-07-17: qty 10

## 2021-07-17 MED ORDER — FENTANYL CITRATE (PF) 100 MCG/2ML IJ SOLN
INTRAMUSCULAR | Status: AC
  Filled 2021-07-17: qty 2

## 2021-07-17 MED ORDER — SODIUM CHLORIDE 0.9 % WEIGHT BASED INFUSION: 1.0000 mL/kg/h | INTRAVENOUS | Status: AC

## 2021-07-17 MED ORDER — ACETAMINOPHEN 325 MG PO TABS: 650.0000 mg | ORAL_TABLET | ORAL | Status: DC | PRN

## 2021-07-17 MED ORDER — VERAPAMIL HCL 2.5 MG/ML IV SOLN
INTRAVENOUS | Status: DC | PRN
  Administered 2021-07-17: 10 mL via INTRA_ARTERIAL

## 2021-07-17 MED ORDER — ASPIRIN EC 81 MG PO TBEC
81.0000 mg | DELAYED_RELEASE_TABLET | Freq: Every day | ORAL | Status: DC
  Administered 2021-07-18: 81 mg via ORAL
  Filled 2021-07-17: qty 1

## 2021-07-17 MED ORDER — CLOPIDOGREL BISULFATE 75 MG PO TABS: 75.0000 mg | ORAL_TABLET | ORAL | Status: DC

## 2021-07-17 MED ORDER — VIPERSLIDE LUBRICANT OPTIME: TOPICAL | Status: DC | PRN

## 2021-07-17 MED ORDER — CARVEDILOL 3.125 MG PO TABS
3.1250 mg | ORAL_TABLET | Freq: Two times a day (BID) | ORAL | Status: DC
  Administered 2021-07-17 – 2021-07-18 (×2): 3.125 mg via ORAL
  Filled 2021-07-17 (×2): qty 1

## 2021-07-17 MED ORDER — CLOPIDOGREL BISULFATE 75 MG PO TABS
75.0000 mg | ORAL_TABLET | Freq: Every day | ORAL | Status: DC
  Administered 2021-07-18: 75 mg via ORAL
  Filled 2021-07-17: qty 1

## 2021-07-17 MED ORDER — DIGOXIN 125 MCG PO TABS
0.1250 mg | ORAL_TABLET | Freq: Every day | ORAL | Status: DC
  Administered 2021-07-18: 0.125 mg via ORAL
  Filled 2021-07-17: qty 1

## 2021-07-17 MED ORDER — MIDAZOLAM HCL 2 MG/2ML IJ SOLN
INTRAMUSCULAR | Status: AC
  Filled 2021-07-17: qty 2

## 2021-07-17 MED ORDER — FUROSEMIDE 40 MG PO TABS: 40.0000 mg | ORAL_TABLET | ORAL | Status: DC

## 2021-07-17 MED ORDER — SODIUM CHLORIDE 0.9% FLUSH: 3.0000 mL | INTRAVENOUS | Status: DC | PRN

## 2021-07-17 MED ORDER — HEPARIN (PORCINE) IN NACL 1000-0.9 UT/500ML-% IV SOLN
INTRAVENOUS | Status: AC
  Filled 2021-07-17: qty 500

## 2021-07-17 MED ORDER — IOHEXOL 350 MG/ML SOLN
INTRAVENOUS | Status: DC | PRN
  Administered 2021-07-17: 85 mL

## 2021-07-17 MED ORDER — HEPARIN SODIUM (PORCINE) 1000 UNIT/ML IJ SOLN
INTRAMUSCULAR | Status: DC | PRN
  Administered 2021-07-17: 2000 [IU] via INTRAVENOUS
  Administered 2021-07-17: 7000 [IU] via INTRAVENOUS
  Administered 2021-07-17: 2000 [IU] via INTRAVENOUS
  Administered 2021-07-17: 3000 [IU] via INTRAVENOUS

## 2021-07-17 MED ORDER — POLYVINYL ALCOHOL 1.4 % OP SOLN
1.0000 [drp] | Freq: Three times a day (TID) | OPHTHALMIC | Status: DC | PRN
  Filled 2021-07-17: qty 15

## 2021-07-17 MED ORDER — ADULT MULTIVITAMIN W/MINERALS CH
1.0000 | ORAL_TABLET | Freq: Every day | ORAL | Status: DC
  Administered 2021-07-18: 1 via ORAL
  Filled 2021-07-17: qty 1

## 2021-07-17 MED ORDER — PREDNISONE 10 MG PO TABS
10.0000 mg | ORAL_TABLET | Freq: Every morning | ORAL | Status: DC
  Administered 2021-07-18: 10 mg via ORAL
  Filled 2021-07-17: qty 1

## 2021-07-17 MED ORDER — ONDANSETRON HCL 4 MG/2ML IJ SOLN: 4.0000 mg | Freq: Four times a day (QID) | INTRAMUSCULAR | Status: DC | PRN

## 2021-07-17 SURGICAL SUPPLY — 19 items
BALL SAPPHIRE NC24 3.5X22 (BALLOONS) ×2
BALLOON SAPPHIRE NC24 3.5X22 (BALLOONS) IMPLANT
CATH DRAGONFLY OPTIS 2.7FR (CATHETERS) ×1 IMPLANT
CATH VISTA GUIDE 6FR XBLAD3.5 (CATHETERS) ×1 IMPLANT
CROWN DIAMONDBACK CLASSIC 1.25 (BURR) ×1 IMPLANT
DEVICE RAD COMP TR BAND LRG (VASCULAR PRODUCTS) ×1 IMPLANT
GLIDESHEATH SLEND SS 6F .021 (SHEATH) ×1 IMPLANT
GUIDEWIRE INQWIRE 1.5J.035X260 (WIRE) IMPLANT
INQWIRE 1.5J .035X260CM (WIRE) ×2
KIT ENCORE 26 ADVANTAGE (KITS) ×1 IMPLANT
KIT HEART LEFT (KITS) ×2 IMPLANT
LUBRICANT VIPERSLIDE CORONARY (MISCELLANEOUS) ×1 IMPLANT
PACK CARDIAC CATHETERIZATION (CUSTOM PROCEDURE TRAY) ×2 IMPLANT
SHEATH PROBE COVER 6X72 (BAG) ×1 IMPLANT
STENT ONYX FRONTIER 3.0X30 (Permanent Stent) ×1 IMPLANT
TRANSDUCER W/STOPCOCK (MISCELLANEOUS) ×2 IMPLANT
TUBING CIL FLEX 10 FLL-RA (TUBING) ×2 IMPLANT
WIRE ASAHI PROWATER 180CM (WIRE) ×1 IMPLANT
WIRE VIPERWIRE COR FLEX .012 (WIRE) ×1 IMPLANT

## 2021-07-17 NOTE — Progress Notes (Signed)
Rt radial site oozing on arrival to holding. Veronica Romero removed and repositioned TR band w/13cc air. No hematoma. Good pleth waveform, palpable rt radial ?

## 2021-07-17 NOTE — Interval H&P Note (Signed)
History and Physical Interval Note: ? ?07/17/2021 ?7:19 AM ? ?Veronica Romero  has presented today for surgery, with the diagnosis of CAD.  The various methods of treatment have been discussed with the patient and family. After consideration of risks, benefits and other options for treatment, the patient has consented to  Procedure(s): ?CORONARY ATHERECTOMY (N/A) as a surgical intervention.  The patient's history has been reviewed, patient examined, no change in status, stable for surgery.  I have reviewed the patient's chart and labs.  Questions were answered to the patient's satisfaction.   ? ? ?Collier Salina [facility] ?07/17/2021 ?7:19 AM ? ? ? ?

## 2021-07-17 NOTE — Interval H&P Note (Signed)
History and Physical Interval Note: ? ?07/17/2021 ?7:19 AM ? ?Veronica Romero  has presented today for surgery, with the diagnosis of CAD.  The various methods of treatment have been discussed with the patient and family. After consideration of risks, benefits and other options for treatment, the patient has consented to  Procedure(s): ?CORONARY ATHERECTOMY (N/A) as a surgical intervention.  The patient's history has been reviewed, patient examined, no change in status, stable for surgery.  I have reviewed the patient's chart and labs.  Questions were answered to the patient's satisfaction.   ?Cath Lab Visit (complete for each Cath Lab visit) ? ?Clinical Evaluation Leading to the Procedure:  ? ?ACS: Yes.   ? ?Non-ACS:   ? ?Anginal Classification: CCS III ? ?Anti-ischemic medical therapy: Maximal Therapy (2 or more classes of medications) ? ?Non-Invasive Test Results: No non-invasive testing performed ? ?Prior CABG: No previous CABG ? ? ? ? ? ? ? ?Theron Arista [facility] ?07/17/2021 ?7:19 AM ? ? ? ?

## 2021-07-18 ENCOUNTER — Other Ambulatory Visit (HOSPITAL_COMMUNITY): Payer: Self-pay

## 2021-07-18 ENCOUNTER — Other Ambulatory Visit: Payer: Self-pay | Admitting: Cardiology

## 2021-07-18 ENCOUNTER — Encounter (HOSPITAL_COMMUNITY): Payer: Self-pay | Admitting: Cardiology

## 2021-07-18 DIAGNOSIS — M05741 Rheumatoid arthritis with rheumatoid factor of right hand without organ or systems involvement: Principal | ICD-10-CM

## 2021-07-18 DIAGNOSIS — M059 Rheumatoid arthritis with rheumatoid factor, unspecified: Principal | ICD-10-CM

## 2021-07-18 DIAGNOSIS — M0579 Rheumatoid arthritis with rheumatoid factor of multiple sites without organ or systems involvement: Principal | ICD-10-CM

## 2021-07-18 DIAGNOSIS — M8589 Other specified disorders of bone density and structure, multiple sites: Principal | ICD-10-CM

## 2021-07-18 DIAGNOSIS — M05742 Rheumatoid arthritis with rheumatoid factor of left hand without organ or systems involvement: Principal | ICD-10-CM

## 2021-07-18 DIAGNOSIS — I255 Ischemic cardiomyopathy: Secondary | ICD-10-CM | POA: Diagnosis not present

## 2021-07-18 DIAGNOSIS — Z79899 Other long term (current) drug therapy: Secondary | ICD-10-CM

## 2021-07-18 DIAGNOSIS — I25110 Atherosclerotic heart disease of native coronary artery with unstable angina pectoris: Secondary | ICD-10-CM | POA: Diagnosis not present

## 2021-07-18 DIAGNOSIS — M069 Rheumatoid arthritis, unspecified: Secondary | ICD-10-CM | POA: Diagnosis not present

## 2021-07-18 DIAGNOSIS — I214 Non-ST elevation (NSTEMI) myocardial infarction: Secondary | ICD-10-CM | POA: Diagnosis not present

## 2021-07-18 LAB — CBC
HCT: 25.8 % — ABNORMAL LOW (ref 36.0–46.0)
Hemoglobin: 8.4 g/dL — ABNORMAL LOW (ref 12.0–15.0)
MCH: 29.9 pg (ref 26.0–34.0)
MCHC: 32.6 g/dL (ref 30.0–36.0)
MCV: 91.8 fL (ref 80.0–100.0)
Platelets: 426 10*3/uL — ABNORMAL HIGH (ref 150–400)
RBC: 2.81 MIL/uL — ABNORMAL LOW (ref 3.87–5.11)
RDW: 15 % (ref 11.5–15.5)
WBC: 10.5 10*3/uL (ref 4.0–10.5)
nRBC: 0 % (ref 0.0–0.2)

## 2021-07-18 LAB — BASIC METABOLIC PANEL
Anion gap: 10 (ref 5–15)
BUN: 18 mg/dL (ref 8–23)
CO2: 23 mmol/L (ref 22–32)
Calcium: 8.3 mg/dL — ABNORMAL LOW (ref 8.9–10.3)
Chloride: 101 mmol/L (ref 98–111)
Creatinine, Ser: 0.94 mg/dL (ref 0.44–1.00)
GFR, Estimated: >60 mL/min (ref >60)
Glucose, Bld: 100 mg/dL — ABNORMAL HIGH (ref 70–99)
Potassium: 2.9 mmol/L — ABNORMAL LOW (ref 3.5–5.1)
Sodium: 134 mmol/L — ABNORMAL LOW (ref 135–145)

## 2021-07-18 MED ORDER — BD TUBERCULIN SYRINGE 1 ML 27 X 1/2"
3 refills | 0 days | Status: CP
Start: 2021-07-18 — End: ?
  Filled 2021-07-20: qty 12, 84d supply, fill #0

## 2021-07-18 MED ORDER — DAPAGLIFLOZIN PROPANEDIOL 10 MG PO TABS
10.0000 mg | ORAL_TABLET | Freq: Every day | ORAL | 0 refills | Status: DC
  Filled 2021-07-18 – 2021-07-21 (×2): qty 30, 30d supply, fill #0
  Filled 2021-07-21: qty 90, 90d supply, fill #0

## 2021-07-18 MED ORDER — ASPIRIN 81 MG PO TBEC
81.0000 mg | DELAYED_RELEASE_TABLET | Freq: Every day | ORAL | 1 refills | Status: AC
  Filled 2021-07-18 – 2022-03-25 (×3): qty 90, 90d supply, fill #0

## 2021-07-18 MED ORDER — NITROGLYCERIN 0.4 MG SL SUBL
0.4000 mg | SUBLINGUAL_TABLET | SUBLINGUAL | 2 refills | Status: DC | PRN
  Filled 2021-07-18: qty 25, 7d supply, fill #0

## 2021-07-18 MED ORDER — METHOTREXATE SODIUM CHEMO INJECTION 50 MG/2ML: 25.0000 mg | INTRAMUSCULAR | Status: AC

## 2021-07-18 MED ORDER — CLOPIDOGREL BISULFATE 75 MG PO TABS
75.0000 mg | ORAL_TABLET | Freq: Every day | ORAL | 2 refills | Status: DC
  Filled 2021-07-18 – 2021-07-21 (×3): qty 90, 90d supply, fill #0
  Filled 2021-10-14: qty 90, 90d supply, fill #1
  Filled 2022-01-14: qty 90, 90d supply, fill #2

## 2021-07-18 MED ORDER — POTASSIUM CHLORIDE CRYS ER 20 MEQ PO TBCR
40.0000 meq | EXTENDED_RELEASE_TABLET | Freq: Once | ORAL | Status: AC
Start: 2021-07-18 — End: 2021-07-18
  Administered 2021-07-18: 40 meq via ORAL
  Filled 2021-07-18: qty 2

## 2021-07-18 MED ORDER — ROSUVASTATIN CALCIUM 40 MG PO TABS
40.0000 mg | ORAL_TABLET | Freq: Every day | ORAL | 0 refills | Status: DC
  Filled 2021-07-18: qty 90, 90d supply, fill #0

## 2021-07-18 MED ORDER — CARVEDILOL 3.125 MG PO TABS
3.1250 mg | ORAL_TABLET | Freq: Two times a day (BID) | ORAL | 0 refills | Status: DC
Start: 2021-07-18 — End: 2021-08-12
  Filled 2021-07-18 – 2021-07-21 (×3): qty 180, 90d supply, fill #0

## 2021-07-18 MED ORDER — LOSARTAN POTASSIUM 25 MG PO TABS
12.5000 mg | ORAL_TABLET | Freq: Every day | ORAL | 2 refills | Status: DC
  Filled 2021-07-18 – 2021-10-09 (×3): qty 30, 60d supply, fill #0
  Filled 2021-12-04 – 2021-12-05 (×2): qty 30, 60d supply, fill #1

## 2021-07-18 MED ORDER — POTASSIUM CHLORIDE CRYS ER 20 MEQ PO TBCR
20.0000 meq | EXTENDED_RELEASE_TABLET | Freq: Every day | ORAL | 1 refills | Status: DC
  Filled 2021-07-18: qty 30, 30d supply, fill #0
  Filled 2021-08-12: qty 30, 30d supply, fill #1

## 2021-07-18 MED ORDER — SPIRONOLACTONE 25 MG PO TABS
12.5000 mg | ORAL_TABLET | Freq: Every day | ORAL | 2 refills | Status: DC
  Filled 2021-07-18 – 2021-10-09 (×2): qty 30, 60d supply, fill #0
  Filled 2021-12-10: qty 30, 60d supply, fill #1

## 2021-07-18 NOTE — Discharge Summary (Addendum)
?Discharge Summary  ?  ?Patient ID: Veronica Romero ?MRN: [id number]; DOB: 1950-11-20 ? ?Admit date: 07/17/2021 ?Discharge date: 07/18/2021 ? ?PCP:  Larae Grooms, NP ?  ?[facility] Prov[facility] ?Cardiologist:  Thomasene Ripple, DO    ? ?Discharge Diagnoses  ?  ?Principal Problem: ?  NSTEMI (non-ST elevated myocardial infarction) (HCC) ?Active Problems: ?  Chest pain ?  Rheumatoid arthritis (HCC) ?  CAD (coronary artery disease), native coronary artery ?  Ischemic cardiomyopathy ?  Mixed hyperlipidemia ?  CAD (coronary artery disease) ? ? ? ?Diagnostic Studies/Procedures  ?  ?Cath: 07/17/21 ? ?  Ost LAD to Mid LAD lesion is 85% stenosed. ?  A drug-eluting stent was successfully placed using a STENT ONYX FRONTIER 3.0X30. ?  Post intervention, there is a 0% residual stenosis. ?  ?Successful PCI of the proximal to mid LAD with orbital atherectomy and DES x 1 with OCT guidance ?  ?  ?Plan: will monitor overnight. Anticipate DC in am. DAPT for one year.  ? ?Diagnostic ?Dominance: Left ?Intervention ? ? ?_____________ ?  ?History of Present Illness   ?  ?Veronica Romero is a 71 y.o. female with with a hx of recently diagnosed coronary artery disease with severe left anterior descending artery stenosis which was medically managed while she was hospitalized, plan for catheterization as an outpatient, mild pulmonary hypertension, rheumatoid arthritis on methotrexate prednisone and Humira, recent COVID-19 infection with associated acute hypoxic respiratory failure, community-acquired pneumonia and newly diagnosed heart failure with reduced ejection fraction/ischemic cardiomyopathy EF 25 to 30% with wall motion abnormalities. ?  ?During her hospitalization on presentation she was admitted for acute hypoxic respiratory failure she was in the ICU underwent diagnostic bronchoscopy which was unrevealing.  She was then extubated within several days.  During that time she had an echocardiogram that showed newly depressed ejection  fraction wall motion abnormalities. A heart catheterization was performed showing coronary artery disease with LAD stenosis.  Given her acute infection PCI was not pursued. Medical therapy was optimized.  She was discharged home with oxygen due to her acute hypoxic respiratory failure.  She presented back to the office with her daughter for follow up. Seen by Dr. Servando Salina and set up for outpatient cardiac cath.  ? ?Hospital Course  ?   ?Coronary artery disease: Underwent successful PCI LAD with DES x1. Recommendations for DAPT with ASA/Plavix for at least one year. No chest pain post cath. Seen by CR ?-- continue ASA, plavix, Coreg, statin, losartan, spiro ?  ?Ischemic cardiomyopathy/chronic systolic heart failure: known EF of 30%, no significant volume overload during admission ?- On low-dose carvedilol, losartan, Farxiga, spironolactone ?-- follow up echo in 3 months ?  ?Hypokalemia: Potassium 2.9. Given 40 meq x1 prior to discharge. ?-- continue on spironolactone 12.5 mg once a day a potassium sparing diuretic, as well as lasix 40mg  PO every other day ?-- plan for potassium 20 meq daily ?-- recheck BMET 3/27 ?  ?Rheumatoid arthritis: ?- Continue current home regimen ?  ?Chronic anemia: Prior hemoglobin in 9 range ?--continue to monitor ?  ?Hyperlipidemia: ?- home statin was increased from Crestor 20 to 40mg  daily ?-- will need FLP/LFTs in 8 weeks  ?  ?Patient was seen by Dr. Anne Fu and deemed stable for discharge home. Follow up in the office arranged. Medications sent to the [facility] pharmacy ? ?Did the patient have an acute coronary syndrome (MI, NSTEMI, STEMI, etc) this admission?:  No                               ?  Did the patient have a percutaneous coronary intervention (stent / angioplasty)?:  Yes.   ? ? ?Cath/PCI Registry Performance & Quality Measures: ?Aspirin prescribed? - Yes ?ADP Receptor Inhibitor (Plavix/Clopidogrel, Brilinta/Ticagrelor or Effient/Prasugrel) prescribed (includes medically managed patients)?  - Yes ?High Intensity Statin (Lipitor 40-80mg  or Crestor 20-40mg ) prescribed? - Yes ?For EF <40%, was ACEI/ARB prescribed? - Yes ?For EF <40%, Aldosterone Antagonist (Spironolactone or Eplerenone) prescribed? - Yes ?Cardiac Rehab Phase II ordered? - Yes  ? ? ?The patient will be scheduled for a TOC follow up appointment in 10-14 days.  A message has been sent to the [facility] and Scheduling Pool at the office where the patient should be seen for follow up.  ?_____________ ? ?Discharge Vitals ?Blood pressure (!) 110/46, pulse 80, temperature 98.3 ?F (36.8 ?C), temperature source Oral, resp. rate 16, height  (1.651 m), weight 67.1 kg, SpO2 97 %.  Ceasar Mons Weights  ? 07/17/21 0604  ?Weight: 67.1 kg  ? ? ?Labs & Radiologic Studies  ?  ?CBC ?Recent Labs  ?  07/17/21 ?0641 07/18/21 ?0321  ?WBC 14.5* 10.5  ?HGB 9.5* 8.4*  ?HCT 28.0* 25.8*  ?MCV 90.0 91.8  ?PLT 440* 426*  ? ?Basic Metabolic Panel ?Recent Labs  ?  07/18/21 ?0321  ?NA 134*  ?K 2.9*  ?CL 101  ?CO2 23  ?GLUCOSE 100*  ?BUN 18  ?CREATININE 0.94  ?CALCIUM 8.3*  ? ?Liver Function Tests ?No results for input(s): AST, ALT, ALKPHOS, BILITOT, PROT, ALBUMIN in the last 72 hours. ?No results for input(s): LIPASE, AMYLASE in the last 72 hours. ?High Sensitivity Troponin:   ?No results for input(s): TROPONINIHS in the last 720 hours.  ?BNP ?Invalid input(s): POCBNP ?D-Dimer ?No results for input(s): DDIMER in the last 72 hours. ?Hemoglobin A1C ?No results for input(s): HGBA1C in the last 72 hours. ?Fasting Lipid Panel ?No results for input(s): CHOL, HDL, LDLCALC, TRIG, CHOLHDL, LDLDIRECT in the last 72 hours. ?Thyroid Function Tests ?No results for input(s): TSH, T4TOTAL, T3FREE, THYROIDAB in the last 72 hours. ? ?Invalid input(s): FREET3 ?_____________  ?CARDIAC CATHETERIZATION ? ?Result Date: 07/17/2021 ?  Ost LAD to Mid LAD lesion is 85% stenosed.   A drug-eluting stent was successfully placed using a STENT ONYX FRONTIER 3.0X30.   Post intervention, there is a 0%  residual stenosis. Successful PCI of the proximal to mid LAD with orbital atherectomy and DES x 1 with OCT guidance Plan: will monitor overnight. Anticipate DC in am. DAPT for one year.  ? ?DG CHEST PORT 1 VIEW ? ?Result Date: 06/21/2021 ?CLINICAL DATA:  Shortness of breath. EXAM: PORTABLE CHEST 1 VIEW COMPARISON:  06/18/2021 and CT chest 06/18/2021. FINDINGS: Trachea is midline. Heart is enlarged, stable. Peripheral and basilar predominant coarsened pulmonary markings with superimposed mid and lower lung zone predominant airspace opacities. Lungs are low in volume. No pleural fluid. IMPRESSION: Low lung volumes with persistent airspace opacities in the mid and lower lung zones, which may be due to edema or pneumonia, superimposed on chronic interstitial lung disease. Electronically Signed   By: Leanna Battles M.D.   On: 06/21/2021 08:11   ?Disposition  ? ?Pt is being discharged home today in good condition. ? ?Follow-up Plans & Appointments  ? ? ? Follow-up Information   ? ? Tobb, Kardie, DO Follow up on 08/11/2021.   ?Specialty: Cardiology ?Why: at 10:40am for your follow up appt ?Contact information: ?[address] ?[address] ?[place] [address] ?[phone] ? ? ?  ?  ? ? [facility] Follow up on 07/24/2021.   ?Specialty: Cardiology ?Why: please come in for repeat labs from 8:30-4pm ?Contact information: ?[address] ?[place] [address] ?[phone] ? ?  ?  ? ?  ?  ? ?  ? ?Discharge Instructions   ? ? Amb Referral to Cardiac Rehabilitation   Complete by: As directed ?  ? Diagnosis:  Coronary Stents ?PTCA  ?  ? After initial evaluation and assessments completed: Virtual Based Care may be provided alone or in conjunction with Phase 2 Cardiac Rehab based on patient barriers.: Yes  ? Call MD for:  difficulty breathing, headache or visual disturbances   Complete by: As directed ?  ? Call MD for:  persistant dizziness or light-headedness   Complete by: As directed ?  ?  Call MD for:  redness, tenderness, or signs of infection (pain, swelling, redness, odor or green/yellow discharge around incision site)   Complete by: As directed ?  ? Diet - low sodium heart healthy   Complete

## 2021-07-18 NOTE — TOC Transition Note (Signed)
Transition of Care (TOC) - CM/SW Discharge Note ? ? ?Patient Details  ?Name: Veronica Romero ?MRN: [id number] ?Date of Birth: 1950/12/25 ? ?Transition of Care (TOC) CM/SW Contact:  ?Leone Haven, RN ?Phone Number: ?07/18/2021, 1:12 PM ? ? ?Clinical Narrative:    ?Patient for dc today, daughter is transporting her home. ? ? ?  ?  ? ? ?Patient Goals and CMS Choice ?  ?  ?  ? ?Discharge Placement ?  ?           ?  ?  ?  ?  ? ?Discharge Plan and Services ?  ?  ?           ?  ?  ?  ?  ?  ?  ?  ?  ?  ?  ? ?Social Determinants of Health (SDOH) Interventions ?  ? ? ?Readmission Risk Interventions ?No flowsheet data found. ? ? ? ? ?

## 2021-07-18 NOTE — Progress Notes (Signed)
Heart Failure Nurse Navigator Progress Note ? ?PCP: Larae Grooms, NP ?PCP-Cardiologist: Antonieta Iba., MD ?Admission Diagnosis: NSTEMI planned PCI ?Admitted from: home with spouse ? ?Presentation:   ?Veronica Romero presented 3/20 for planned PCI. Pt sitting in recliner with feet down. Patient interactive with interview process. Pt already DC, awaiting medications from [facility] pharmacy. Daughter at bedside. Pt and family are primary caregiver for husband with advanced dementia. Pt states husband has HH CNA that comes 6 days/week for 3 hours. Pt/family responsible for care rest of time, husband is a wanderer, total assist with ADLs, incontinent. Pt has significant arthritis.  ?Pt states she drives, but dtr will plan to bring her to appointment.  ?Pt feeling a bit overwhelmed with recent dx and medication changes.  ?Explained benefits of Heart & Vascular Transitions of Care Clinic appointment, patient agreeable.  ? ? ?ECHO/ LVEF: 20-25% ? ?Clinical Course: ? ?Past Medical History:  ?Diagnosis Date  ? Rheumatoid arthritis (HCC)   ?  ? ?Social History  ? ?Socioeconomic History  ? Marital status: Married  ?  Spouse name: Veronica Romero  ? Number of children: 1  ? Years of education: Not on file  ? Highest education level: Some college, no degree  ?Occupational History  ? Occupation: retired  ?Tobacco Use  ? Smoking status: Never  ? Smokeless tobacco: Never  ?Vaping Use  ? Vaping Use: Never used  ?Substance and Sexual Activity  ? Alcohol use: Yes  ?  Alcohol/week: 2.0 standard drinks  ?  Types: 2 Glasses of wine per week  ? Drug use: Never  ? Sexual activity: Not on file  ?Other Topics Concern  ? Not on file  ?Social History Narrative  ? Not on file  ? ?Social Determinants of Health  ? ?Financial Resource Strain: Low Risk   ? Difficulty of Paying Living Expenses: Not very hard  ?Food Insecurity: No Food Insecurity  ? Worried About Programme researcher, broadcasting/film/video in the Last Year: Never true  ? Ran Out of Food in the Last Year: Never true   ?Transportation Needs: No Transportation Needs  ? Lack of Transportation (Medical): No  ? Lack of Transportation (Non-Medical): No  ?Physical Activity: Not on file  ?Stress: Not on file  ?Social Connections: Not on file  ? ? ?High Risk Criteria for Readmission and/or Poor Patient Outcomes: ?Heart failure hospital admissions (last 6 months): 2  ?No Show rate: NA ?Difficult social situation: No ?Demonstrates medication adherence: yes ?Primary Language: English ?Literacy level: able to read/write and comprehend. Wears glasses.  ? ?Education Assessment and Provision: ? ?Detailed education and instructions provided on heart failure disease management including the following: ? ?Signs and symptoms of Heart Failure ?When to call the physician ?Importance of daily weights ?Low sodium diet ?Fluid restriction ?Medication management ?Anticipated future follow-up appointments ? ?Patient education given on each of the above topics.  Patient acknowledges understanding via teach back method and acceptance of all instructions. ? ?Education Materials:  "Living Better With Heart Failure" Booklet, HF zone tool, & Daily Weight Tracker Tool. ? ?Patient has scale at home: yes ?Patient has pill box at home: yes  ? ?Barriers of Care:   ?-new dx ?-caregiver for spouse ? ?Considerations/Referrals:  ? ?Referral made to Heart Failure Pharmacist Stewardship: no, d/t dc ?Referral made to Heart Failure CSW/NCM TOC: no ?Referral made to Heart & Vascular TOC clinic: yes, Tuesday 4/4 @ 9AM ? ?Items for Follow-up on DC/TOC: ?-optimize ?-cont HF education ?-medication cost (pt has assistance  for [facility]) ? ? ?Ozella Rocks, MSN, RN ?Heart Failure Nurse Navigator ?[phone] ? ? ?

## 2021-07-18 NOTE — Progress Notes (Signed)
CARDIAC REHAB PHASE I  ? ?PRE:  Rate/Rhythm: 93 SR ? ?  BP: sitting 121/43 ? ?  SaO2: 95 2L ? ?MODE:  Ambulation: 290 ft  ? ?POST:  Rate/Rhythm: 121 ST ? ?  BP: sitting 138/45  ? ?  SaO2: 96 2L, 94 RA ? ?Pt moved out of bed and ambulated with rollator, 2L, standby assist. Pt with joint pain at baseline that required her to sit and rest x2 today. Sts her exertion level feels less than PTA. HR up to 121 ST, SaO2 stable on O2, which she has at home. To recliner and she was able to be on RA in recliner. Maintaining 97 RA in recliner after 20 min.  ? ?Discussed MI, stent, Plavix importance, daily wts, low sodium, signs of fluid, activity as tolerated, and CRPII. Pt receptive. Will refer to [facility] CRPII for when her HHPT is completed. She has O2 and rollator at home. She plans to work with HHPT to d/c O2 with exertion at home. ?[phone] ? ?Shylyn Younce Ethelda Chick CES, ACSM ?07/18/2021 ?9:41 AM ? ? ? ? ?

## 2021-07-18 NOTE — Progress Notes (Signed)
? ?Progress Note ? ?Patient Name: Veronica Romero ?Date of Encounter: 07/18/2021 ? ?[facility] Cardiologist: Berniece Salines, DO  ? ?Subjective  ? ?Overall doing well.  Spoke with her daughter as well.  She is comfortable.  Ready to go home.  No chest pain no shortness of breath. ? ?Inpatient Medications  ?  ?Scheduled Meds: ? aspirin EC  81 mg Oral Daily  ? carvedilol  3.125 mg Oral BID WC  ? cholecalciferol  1,000 Units Oral Daily  ? clopidogrel  75 mg Oral Q breakfast  ? dapagliflozin propanediol  10 mg Oral Daily  ? digoxin  0.125 mg Oral Daily  ? [START ON 07/19/2021] furosemide  40 mg Oral QODAY  ? losartan  12.5 mg Oral Daily  ? multivitamin with minerals  1 tablet Oral Daily  ? predniSONE  10 mg Oral q AM  ? rosuvastatin  20 mg Oral Daily  ? sodium chloride flush  3 mL Intravenous Q12H  ? sodium chloride flush  3 mL Intravenous Q12H  ? spironolactone  12.5 mg Oral Daily  ? ?Continuous Infusions: ? sodium chloride    ? ?PRN Meds: ?sodium chloride, acetaminophen, ondansetron (ZOFRAN) IV, polyvinyl alcohol, sodium chloride flush, traMADol  ? ?Vital Signs  ?  ?Vitals:  ? 07/17/21 1645 07/17/21 2159 07/18/21 0341 07/18/21 0825  ?BP:   (!) 120/39 (!) 121/43  ?Pulse: 89  75 93  ?Resp: 18 16 14    ?Temp: 98 ?F (36.7 ?C) 98.1 ?F (36.7 ?C) 99 ?F (37.2 ?C)   ?TempSrc: Axillary  Oral   ?SpO2: 100%  98%   ?Weight:      ?Height:      ? ? ?Intake/Output Summary (Last 24 hours) at 07/18/2021 1045 ?Last data filed at 07/17/2021 1647 ?Gross per 24 hour  ?Intake 428.57 ml  ?Output --  ?Net 428.57 ml  ? ?Last 3 Weights 07/17/2021 07/13/2021 07/10/2021  ?Weight (lbs) 148 lb 148 lb 151 lb 6.4 oz  ?Weight (kg) 67.132 kg 67.132 kg 68.675 kg  ?   ? ?Telemetry  ?  ?No adverse arrhythmias.- Personally Reviewed ? ?ECG  ?  ?Normal sinus rhythm- Personally Reviewed ? ?Physical Exam  ? ?GEN: No acute distress.   ?Neck: No JVD ?Cardiac: RRR, no murmurs, rubs, or gallops.  ?Respiratory: Clear to auscultation bilaterally. ?GI: Soft, nontender,  non-distended  ?MS: No edema; No deformity.  Cath site normal ?Neuro:  Nonfocal  ?Psych: Normal affect  ? ?Labs  ?  ?High Sensitivity Troponin:  No results for input(s): TROPONINIHS in the last 720 hours.   ?Chemistry ?Recent Labs  ?Lab 07/13/21 ?1105 07/18/21 ?0321  ?NA 137 134*  ?K 3.6 2.9*  ?CL 97 101  ?CO2 26 23  ?GLUCOSE 93 100*  ?BUN 22 18  ?CREATININE 0.94 0.94  ?CALCIUM 9.2 8.3*  ?MG 2.3  --   ?GFRNONAA  --  >60  ?ANIONGAP  --  10  ?  ?Lipids No results for input(s): CHOL, TRIG, HDL, LABVLDL, LDLCALC, CHOLHDL in the last 168 hours.  ?Hematology ?Recent Labs  ?Lab 07/12/21 ?1205 07/17/21 ?0641 07/18/21 ?0321  ?WBC 12.6* 14.5* 10.5  ?RBC 3.23* 3.11* 2.81*  ?HGB 9.7* 9.5* 8.4*  ?HCT 29.5* 28.0* 25.8*  ?MCV 91 90.0 91.8  ?MCH 30.0 30.5 29.9  ?MCHC 32.9 33.9 32.6  ?RDW 13.6 14.7 15.0  ?PLT 377 440* 426*  ? ?Thyroid No results for input(s): TSH, FREET4 in the last 168 hours.  ?BNPNo results for input(s): BNP, PROBNP in the  last 168 hours.  ?DDimer No results for input(s): DDIMER in the last 168 hours.  ? ?Radiology  ?  ?CARDIAC CATHETERIZATION ? ?Result Date: 07/17/2021 ?  Ost LAD to Mid LAD lesion is 85% stenosed.   A drug-eluting stent was successfully placed using a STENT ONYX FRONTIER 3.0X30.   Post intervention, there is a 0% residual stenosis. Successful PCI of the proximal to mid LAD with orbital atherectomy and DES x 1 with OCT guidance Plan: will monitor overnight. Anticipate DC in am. DAPT for one year.   ? ?Cardiac Studies  ? ?  Ost LAD to Mid LAD lesion is 85% stenosed. ?  A drug-eluting stent was successfully placed using a STENT ONYX FRONTIER 3.0X30. ?  Post intervention, there is a 0% residual stenosis. ?  ?Successful PCI of the proximal to mid LAD with orbital atherectomy and DES x 1 with OCT guidance ? Diagnostic ?Dominance: Left ?Intervention ? ? ?  ?Plan: will monitor overnight. Anticipate DC in am. DAPT for one year.  ? ?Patient Profile  ?   ?71 y.o. female with LAD stenosis status post PCI by  Dr. Martin[place] with ischemic cardiomyopathy EF 25 to 30% ? ?Assessment & Plan  ?  ?Coronary artery disease ?- Successful PCI LAD.  Continue with dual antiplatelet therapy, goal-directed medical therapy.  Cardiac rehab. ? ?Ischemic cardiomyopathy/chronic systolic heart failure ?- On low-dose carvedilol, losartan, Farxiga, spironolactone.  No changes made. ?-EF 30%.  Repeat echo in 3 months. ? ?Hypokalemia ?- Potassium 2.9.  We will replete here with 40 mill equivalents p.o. x1 and then at home continue 20 mEq daily.  Note, she is on spironolactone 12.5 mg once a day a potassium sparing diuretic.  Also she is taking Lasix 40 mg every other day. ? ?Rheumatoid arthritis ?- Continue current home regimen. ? ?Chronic anemia ?- Prior hemoglobin in 9 range.  Continue to monitor.  Per primary team as outpatient. ? ?I am comfortable with her discharge home. ? ?Hyperlipidemia ?- Continue with statin. ? ?Follow-up 4 weeks post cath. ? ?For questions or updates, please contact [facility] ?Please consult [email or web address] for contact info under  ? ?  ?   ?Signed, ?Candee Furbish, MD  ?07/18/2021, 10:45 AM   ? ?

## 2021-07-19 ENCOUNTER — Other Ambulatory Visit (HOSPITAL_COMMUNITY): Payer: Self-pay

## 2021-07-19 MED FILL — Nitroglycerin IV Soln 100 MCG/ML in D5W: INTRA_ARTERIAL | Qty: 10 | Status: AC

## 2021-07-20 ENCOUNTER — Telehealth: Payer: Self-pay | Admitting: Nurse Practitioner

## 2021-07-20 NOTE — Telephone Encounter (Signed)
Spoke with Zella Ball with [facility] and informed her that Larae Grooms, NP is OK with verbal orders for patient to have oxygen decreased. Zella Ball says she would have her nurse supervisor to give our office a call back to take the verbal order over the phone as Zella Ball says she was not allowed to as she is PT.  ?

## 2021-07-20 NOTE — Telephone Encounter (Signed)
Veronica Romero with [facility] called asking for her oxygen to be turned down.  She came home from the hospital with it and does not have an apt with pulmonology  until May ? ?CB#  [phone] ? ?Fax  [phone]  attn Lauren ?

## 2021-07-20 NOTE — Telephone Encounter (Signed)
Nurse supervisor returned called from [facility]. Provided OK for verbal orders for patient to use oxygen as needed. Nurse supervisor verbalized understanding and has no further questions at this time.  ?

## 2021-07-21 ENCOUNTER — Other Ambulatory Visit (HOSPITAL_COMMUNITY): Payer: Self-pay

## 2021-07-21 ENCOUNTER — Encounter: Payer: Self-pay | Admitting: Cardiology

## 2021-07-28 LAB — ACID FAST CULTURE WITH REFLEXED SENSITIVITIES (MYCOBACTERIA)
Acid Fast Culture: NEGATIVE
Acid Fast Culture: NEGATIVE

## 2021-08-11 ENCOUNTER — Encounter: Payer: Self-pay | Admitting: Cardiology

## 2021-08-11 ENCOUNTER — Ambulatory Visit (INDEPENDENT_AMBULATORY_CARE_PROVIDER_SITE_OTHER): Payer: Medicare Other | Admitting: Cardiology

## 2021-08-11 VITALS — BP 134/56 | HR 78 | Ht 67.0 in | Wt 146.4 lb

## 2021-08-11 DIAGNOSIS — I10 Essential (primary) hypertension: Secondary | ICD-10-CM

## 2021-08-11 DIAGNOSIS — I255 Ischemic cardiomyopathy: Secondary | ICD-10-CM

## 2021-08-11 DIAGNOSIS — E782 Mixed hyperlipidemia: Secondary | ICD-10-CM | POA: Diagnosis not present

## 2021-08-11 DIAGNOSIS — I2510 Atherosclerotic heart disease of native coronary artery without angina pectoris: Secondary | ICD-10-CM | POA: Diagnosis not present

## 2021-08-11 DIAGNOSIS — I272 Pulmonary hypertension, unspecified: Secondary | ICD-10-CM

## 2021-08-11 MED ORDER — EMPAGLIFLOZIN 10 MG PO TABS: 10.0000 mg | ORAL_TABLET | Freq: Every day | ORAL | 3 refills | Status: DC

## 2021-08-11 NOTE — Progress Notes (Signed)
?Cardiology Office Note:   ? ?Date:  08/11/2021  ? ?ID:  Veronica Romero, DOB 1950/07/02, MRN [id number] ? ?PCP:  Jon Billings, NP  ?Cardiologist:  Berniece Salines, DO  ?Electrophysiologist:  None  ? ?Referring MD: Jon Billings, NP  ? ?No chief complaint on file. ? ? ?History of Present Illness:   ? ?Veronica Romero is a 71 y.o. female with a hx of coronary artery disease with severe left anterior descending artery stenosis status post PCI to the LAD, mild pulmonary hypertension, rheumatoid arthritis on methotrexate prednisone and Humira, recent COVID-19 infection with associated acute hypoxic respiratory failure, community-acquired pneumonia and newly diagnosed heart failure with reduced ejection fraction/ischemic cardiomyopathy EF 25 to 30% with wall motion abnormalities. ? ?I saw the patient on 05/15/2021 posthospitalization.  During that visit she was ending her PCI of the LAD.  At that time I continued the patient on her Coreg, losartan, spironolactone as well as her [place].  Since her last visit she has not been able to take her Wilder Glade because of insurance coverage. ? ?She tells me she is doing very well.  She is happy with her recovery.  She is starting to do things around the house.  She was asking if she can get off some of her medications.  Explained to the patient that the medications that she is on is to help her ejection fraction improve as well as her dual antiplatelet therapy for her stent and her statin medication. ? ?No other questions. ? ?Past Medical History:  ?Diagnosis Date  ? Rheumatoid arthritis ([place])   ? ? ?Past Surgical History:  ?Procedure Laterality Date  ? CORONARY ATHERECTOMY N/A 07/17/2021  ? Procedure: CORONARY ATHERECTOMY;  Surgeon: Martinique, Peter M, MD;  Location: [facility];  Service: Cardiovascular;  Laterality: N/A;  ? RIGHT/LEFT HEART CATH AND CORONARY ANGIOGRAPHY N/A 06/16/2021  ? Procedure: RIGHT/LEFT HEART CATH AND CORONARY ANGIOGRAPHY;  Surgeon: Belva Crome, MD;   Location: [facility];  Service: Cardiovascular;  Laterality: N/A;  ? ? ?Current Medications: ?Current Meds  ?Medication Sig  ? Adalimumab (HUMIRA PEN) 40 MG/0.4ML PNKT Inject 0.4 mLs into the skin every Tuesday. Tuesday  ? aspirin 81 MG EC tablet Take 1 tablet (81 mg total) by mouth daily. Swallow whole.  ? carvedilol (COREG) 3.125 MG tablet Take 1 tablet (3.125 mg total) by mouth 2 (two) times daily with a meal.  ? Cholecalciferol (VITAMIN D3 PO) Take 1 tablet by mouth daily.  ? clopidogrel (PLAVIX) 75 MG tablet Take 1 tablet (75 mg total) by mouth daily.  ? empagliflozin (JARDIANCE) 10 MG TABS tablet Take 1 tablet (10 mg total) by mouth daily before breakfast.  ? furosemide (LASIX) 40 MG tablet Take 1 tablet (40 mg total) by mouth every other day.  ? leucovorin (WELLCOVORIN) 5 MG tablet Take 15 mg by mouth every Saturday.  ? losartan (COZAAR) 25 MG tablet Take 1/2 tablet (12.5 mg total) by mouth daily.  ? methotrexate 50 MG/2ML injection Inject 1 mL (25 mg total) into the muscle every Saturday. Hold methotrexate for 2 more weeks and then resume 3/10  ? Multiple Vitamin (MULTIVITAMIN WITH MINERALS) TABS tablet Take 1 tablet by mouth daily.  ? nitroGLYCERIN (NITROSTAT) 0.4 MG SL tablet Place 1 tablet (0.4 mg total) under the tongue every 5 (five) minutes as needed.  ? Polyethyl Glycol-Propyl Glycol (LUBRICANT EYE DROPS) 0.4-0.3 % SOLN Place 1 drop into both eyes 3 (three) times daily as needed (dry/irritated eyes.).  ?  potassium chloride SA (KLOR-CON M) 20 MEQ tablet Take 1 tablet (20 mEq total) by mouth daily.  ? predniSONE (DELTASONE) 5 MG tablet Take 10 mg by mouth in the morning.  ? rosuvastatin (CRESTOR) 40 MG tablet Take 1 tablet (40 mg total) by mouth daily.  ? spironolactone (ALDACTONE) 25 MG tablet Take 1/2 tablet (12.5 mg total) by mouth daily.  ? traMADol (ULTRAM) 50 MG tablet Take 50 mg by mouth every 6 (six) hours as needed (pain.).  ? [DISCONTINUED] dapagliflozin propanediol (FARXIGA) 10 MG  TABS tablet Take 1 tablet (10 mg total) by mouth daily.  ?  ? ?Allergies:   Penicillins  ? ?Social History  ? ?Socioeconomic History  ? Marital status: Married  ?  Spouse name: Veronica Romero  ? Number of children: 1  ? Years of education: Not on file  ? Highest education level: Some college, no degree  ?Occupational History  ? Occupation: retired  ?Tobacco Use  ? Smoking status: Never  ? Smokeless tobacco: Never  ?Vaping Use  ? Vaping Use: Never used  ?Substance and Sexual Activity  ? Alcohol use: Yes  ?  Alcohol/week: 2.0 standard drinks  ?  Types: 2 Glasses of wine per week  ? Drug use: Never  ? Sexual activity: Not on file  ?Other Topics Concern  ? Not on file  ?Social History Narrative  ? Not on file  ? ?Social Determinants of Health  ? ?Financial Resource Strain: Low Risk   ? Difficulty of Paying Living Expenses: Not very hard  ?Food Insecurity: No Food Insecurity  ? Worried About Charity fundraiser in the Last Year: Never true  ? Ran Out of Food in the Last Year: Never true  ?Transportation Needs: No Transportation Needs  ? Lack of Transportation (Medical): No  ? Lack of Transportation (Non-Medical): No  ?Physical Activity: Not on file  ?Stress: Not on file  ?Social Connections: Not on file  ?  ? ?Family History: ?The patient's family history includes Autoimmune disease in her daughter; Heart disease in her father; Kidney disease in her brother. ? ?ROS:   ?Review of Systems  ?Constitution: Negative for decreased appetite, fever and weight gain.  ?HENT: Negative for congestion, ear discharge, hoarse voice and sore throat.   ?Eyes: Negative for discharge, redness, vision loss in right eye and visual halos.  ?Cardiovascular: Negative for chest pain, dyspnea on exertion, leg swelling, orthopnea and palpitations.  ?Respiratory: Negative for cough, hemoptysis, shortness of breath and snoring.   ?Endocrine: Negative for heat intolerance and polyphagia.  ?Hematologic/Lymphatic: Negative for bleeding problem. Does not  bruise/bleed easily.  ?Skin: Negative for flushing, nail changes, rash and suspicious lesions.  ?Musculoskeletal: Negative for arthritis, joint pain, muscle cramps, myalgias, neck pain and stiffness.  ?Gastrointestinal: Negative for abdominal pain, bowel incontinence, diarrhea and excessive appetite.  ?Genitourinary: Negative for decreased libido, genital sores and incomplete emptying.  ?Neurological: Negative for brief paralysis, focal weakness, headaches and loss of balance.  ?Psychiatric/Behavioral: Negative for altered mental status, depression and suicidal ideas.  ?Allergic/Immunologic: Negative for HIV exposure and persistent infections.  ? ? ?EKGs/Labs/Other Studies Reviewed:   ? ?The following studies were reviewed today: ? ? ?EKG: None today ? ?2D echo 2/23 ? 1. Left ventricular ejection fraction, by estimation, is 25 to 30%. The  ?left ventricle has severely decreased function. The left ventricle  ?demonstrates regional wall motion abnormalities (see scoring  ?diagram/findings for description). Left ventricular  ?diastolic parameters are consistent with Grade I diastolic dysfunction  ?(  impaired relaxation).  ? 2. Right ventricular systolic function is mildly reduced. The right  ?ventricular size is normal. There is mildly elevated pulmonary artery  ?systolic pressure. The estimated right ventricular systolic pressure is  ?0000000 mmHg.  ? 3. The mitral valve is normal in structure. Trivial mitral valve  ?regurgitation. No evidence of mitral stenosis.  ? 4. The aortic valve is tricuspid. Aortic valve regurgitation is mild.  ?Aortic valve sclerosis/calcification is present, without any evidence of  ?aortic stenosis.  ? 5. The inferior vena cava is dilated in size with <50% respiratory  ?variability, suggesting right atrial pressure of 15 mmHg.  ?  ?LHC/RHC 06/16/2021 ?  Ost LAD to Mid LAD lesion is 85% stenosed. ?  Prox RCA lesion is 50% stenosed. ?  LPAV lesion is 50% stenosed. ?  There is mild to moderate left  ventricular systolic dysfunction. ?  LV end diastolic pressure is normal. ?  LV end diastolic pressure is normal. ?  The left ventricular ejection fraction is 35-45% by visual estimate. ?  Hemodynamic

## 2021-08-11 NOTE — Patient Instructions (Addendum)
Medication Instructions:  ?Your physician has recommended you make the following change in your medication: ?STOP: Wilder Glade ?START: Jardiance 10 mg once daily  ?*If you need a refill on your cardiac medications before your next appointment, please call your pharmacy* ? ? ?Lab Work: ?None ?If you have labs (blood work) drawn today and your tests are completely normal, you will receive your results only by: ?MyChart Message (if you have MyChart) OR ?A paper copy in the mail ?If you have any lab test that is abnormal or we need to change your treatment, we will call you to review the results. ? ? ?Testing/Procedures: ?Your physician has requested that you have an echocardiogram. Echocardiography is a painless test that uses sound waves to create images of your heart. It provides your doctor with information about the size and shape of your heart and how well your heart?s chambers and valves are working. This procedure takes approximately one hour. There are no restrictions for this procedure. ? ? ? ?Follow-Up: ?At [facility], you and your health needs are our priority.  As part of our continuing mission to provide you with exceptional heart care, we have created designated Provider Care Teams.  These Care Teams include your primary Cardiologist (physician) and Advanced Practice Providers (APPs -  Physician Assistants and Nurse Practitioners) who all work together to provide you with the care you need, when you need it. ? ?We recommend signing up for the patient portal called "MyChart".  Sign up information is provided on this After Visit Summary.  MyChart is used to connect with patients for Virtual Visits (Telemedicine).  Patients are able to view lab/test results, encounter notes, upcoming appointments, etc.  Non-urgent messages can be sent to your provider as well.   ?To learn more about what you can do with MyChart, go to [email or web address].   ? ?Your next appointment:   ?4 month(s) ? ?The format for your  next appointment:   ?In Person ? ?Provider:   ?Berniece Salines, DO  ? ? ?Other Instructions ? ? ?Important Information About Sugar ? ? ? ? ?  ?

## 2021-08-12 ENCOUNTER — Other Ambulatory Visit (HOSPITAL_COMMUNITY): Payer: Self-pay

## 2021-08-12 ENCOUNTER — Other Ambulatory Visit: Payer: Self-pay | Admitting: Cardiology

## 2021-08-14 ENCOUNTER — Other Ambulatory Visit (HOSPITAL_COMMUNITY): Payer: Self-pay

## 2021-08-14 MED ORDER — CARVEDILOL 3.125 MG PO TABS
3.1250 mg | ORAL_TABLET | Freq: Two times a day (BID) | ORAL | 2 refills | Status: DC
Start: 2021-08-14 — End: 2022-07-23
  Filled 2021-08-14: qty 180, 90d supply, fill #0
  Filled 2022-01-14: qty 180, 90d supply, fill #1
  Filled 2022-04-11: qty 180, 90d supply, fill #2

## 2021-08-17 DIAGNOSIS — M05741 Rheumatoid arthritis with rheumatoid factor of right hand without organ or systems involvement: Principal | ICD-10-CM

## 2021-08-17 DIAGNOSIS — M25461 Effusion, right knee: Principal | ICD-10-CM

## 2021-08-17 DIAGNOSIS — M059 Rheumatoid arthritis with rheumatoid factor, unspecified: Principal | ICD-10-CM

## 2021-08-17 DIAGNOSIS — M25561 Pain in right knee: Principal | ICD-10-CM

## 2021-08-17 DIAGNOSIS — M0579 Rheumatoid arthritis with rheumatoid factor of multiple sites without organ or systems involvement: Principal | ICD-10-CM

## 2021-08-17 DIAGNOSIS — M05742 Rheumatoid arthritis with rheumatoid factor of left hand without organ or systems involvement: Principal | ICD-10-CM

## 2021-08-17 MED ORDER — TRAMADOL 50 MG TABLET
ORAL_TABLET | 0 refills | 0 days | Status: CP
Start: 2021-08-17 — End: ?

## 2021-08-18 ENCOUNTER — Telehealth: Payer: Self-pay | Admitting: Nurse Practitioner

## 2021-08-18 NOTE — Telephone Encounter (Signed)
Veronica Romero calling from [facility] is calling because 4 orders where faxed. Needing a quick turn around on the signature of these orders please. Please advise [phone] ?

## 2021-08-21 NOTE — Telephone Encounter (Signed)
Documents have been faxed.

## 2021-08-22 ENCOUNTER — Ambulatory Visit: Payer: Medicare Other | Admitting: Nurse Practitioner

## 2021-08-29 ENCOUNTER — Ambulatory Visit (HOSPITAL_COMMUNITY): Payer: PRIVATE HEALTH INSURANCE

## 2021-09-06 ENCOUNTER — Telehealth: Payer: Self-pay

## 2021-09-06 NOTE — Telephone Encounter (Signed)
Veronica Romero with [facility] health reports pt. Has a hard, red skin area that is painful. It is underneath her right Great toe. Size of an eraser.Pt. has an appointment 09/08/21 and wants PCP to be aware. ?

## 2021-09-07 ENCOUNTER — Other Ambulatory Visit (HOSPITAL_COMMUNITY): Payer: PRIVATE HEALTH INSURANCE

## 2021-09-07 NOTE — Progress Notes (Signed)
? ?BP (!) 100/58   Pulse 78   Temp 98.1 ?F (36.7 ?C) (Oral)   Wt 150 lb 9.6 oz (68.3 kg)   SpO2 93%   BMI 23.59 kg/m?   ? ?Subjective:  ? ? Patient ID: Veronica Romero, female    DOB: [birth year], 71 y.o.   MRN: [id number] ? ?HPI: ?Veronica Romero is a 71 y.o. female ? ?Chief Complaint  ?Patient presents with  ? Medication Management  ? skin issue  ?  R big toe- bottom of toe. Patient states irritation ongoing x 1 week   ? ?Patient states she is doing well.  Feeling a lot better.  She is no longer using her oxygen during the day.  States she does use it at night some.  She is following up with pulmonology next week.  Denies HA, CP, SOB, dizziness, palpitations, visual changes, and lower extremity swelling. ? ? ?Patient states she is having a foot issues. Her right foot is having pain for about 1 week.  Denies discharge and fever. ? ?ANEMIA ?Anemia status: controlled ?Etiology of anemia: ?Duration of anemia treatment:  ?Compliance with treatment:  no current medication regimen ?Iron supplementation side effects: no ?Severity of anemia: severe ?Fatigue: no ?Decreased exercise tolerance: no  ?Dyspnea on exertion: no ?Palpitations: no ?Bleeding: no ?Pica: no ? ?Relevant past medical, surgical, family and social history reviewed and updated as indicated. Interim medical history since our last visit reviewed. ?Allergies and medications reviewed and updated. ? ?Review of Systems  ?Eyes:  Negative for visual disturbance.  ?Respiratory:  Negative for cough, chest tightness and shortness of breath.   ?Cardiovascular:  Negative for chest pain, palpitations and leg swelling.  ?Skin:   ?     Painful callus  ?Neurological:  Negative for dizziness and headaches.  ? ?Per HPI unless specifically indicated above ? ?   ?Objective:  ?  ?BP (!) 100/58   Pulse 78   Temp 98.1 ?F (36.7 ?C) (Oral)   Wt 150 lb 9.6 oz (68.3 kg)   SpO2 93%   BMI 23.59 kg/m?   ?Wt Readings from Last 3 Encounters:  ?09/08/21 150 lb 9.6 oz (68.3 kg)   ?08/11/21 146 lb 6.4 oz (66.4 kg)  ?07/17/21 148 lb (67.1 kg)  ?  ?Physical Exam ?Vitals and nursing note reviewed.  ?Constitutional:   ?   General: She is not in acute distress. ?   Appearance: Normal appearance. She is normal weight. She is not ill-appearing, toxic-appearing or diaphoretic.  ?HENT:  ?   Head: Normocephalic.  ?   Right Ear: External ear normal.  ?   Left Ear: External ear normal.  ?   Nose: Nose normal.  ?   Mouth/Throat:  ?   Mouth: Mucous membranes are moist.  ?   Pharynx: Oropharynx is clear.  ?Eyes:  ?   General:     ?   Right eye: No discharge.     ?   Left eye: No discharge.  ?   Extraocular Movements: Extraocular movements intact.  ?   Conjunctiva/sclera: Conjunctivae normal.  ?   Pupils: Pupils are equal, round, and reactive to light.  ?Cardiovascular:  ?   Rate and Rhythm: Normal rate and regular rhythm.  ?   Heart sounds: No murmur heard. ?Pulmonary:  ?   Effort: Pulmonary effort is normal. No respiratory distress.  ?   Breath sounds: Normal breath sounds. No wheezing or rales.  ?Musculoskeletal:  ?   Cervical back:  Normal range of motion and neck supple.  ?     Feet: ? ?Skin: ?   General: Skin is warm and dry.  ?   Capillary Refill: Capillary refill takes less than 2 seconds.  ?Neurological:  ?   General: No focal deficit present.  ?   Mental Status: She is alert and oriented to person, place, and time. Mental status is at baseline.  ?Psychiatric:     ?   Mood and Affect: Mood normal.     ?   Behavior: Behavior normal.     ?   Thought Content: Thought content normal.     ?   Judgment: Judgment normal.  ? ? ?Results for orders placed or performed during the hospital encounter of 07/17/21  ?CBC  ?Result Value Ref Range  ? WBC 14.5 (H) 4.0 - 10.5 K/uL  ? RBC 3.11 (L) 3.87 - 5.11 MIL/uL  ? Hemoglobin 9.5 (L) 12.0 - 15.0 g/dL  ? HCT 28.0 (L) 36.0 - 46.0 %  ? MCV 90.0 80.0 - 100.0 fL  ? MCH 30.5 26.0 - 34.0 pg  ? MCHC 33.9 30.0 - 36.0 g/dL  ? RDW 14.7 11.5 - 15.5 %  ? Platelets 440 (H) 150 -  400 K/uL  ? nRBC 0.0 0.0 - 0.2 %  ?Basic metabolic panel  ?Result Value Ref Range  ? Sodium 134 (L) 135 - 145 mmol/L  ? Potassium 2.9 (L) 3.5 - 5.1 mmol/L  ? Chloride 101 98 - 111 mmol/L  ? CO2 23 22 - 32 mmol/L  ? Glucose, Bld 100 (H) 70 - 99 mg/dL  ? BUN 18 8 - 23 mg/dL  ? Creatinine, Ser 0.94 0.44 - 1.00 mg/dL  ? Calcium 8.3 (L) 8.9 - 10.3 mg/dL  ? GFR, Estimated >60 >60 mL/min  ? Anion gap 10 5 - 15  ?CBC  ?Result Value Ref Range  ? WBC 10.5 4.0 - 10.5 K/uL  ? RBC 2.81 (L) 3.87 - 5.11 MIL/uL  ? Hemoglobin 8.4 (L) 12.0 - 15.0 g/dL  ? HCT 25.8 (L) 36.0 - 46.0 %  ? MCV 91.8 80.0 - 100.0 fL  ? MCH 29.9 26.0 - 34.0 pg  ? MCHC 32.6 30.0 - 36.0 g/dL  ? RDW 15.0 11.5 - 15.5 %  ? Platelets 426 (H) 150 - 400 K/uL  ? nRBC 0.0 0.0 - 0.2 %  ?POCT Activated clotting time  ?Result Value Ref Range  ? Activated Clotting Time 251 seconds  ?POCT Activated clotting time  ?Result Value Ref Range  ? Activated Clotting Time 257 seconds  ?POCT Activated clotting time  ?Result Value Ref Range  ? Activated Clotting Time 263 seconds  ?POCT Activated clotting time  ?Result Value Ref Range  ? Activated Clotting Time 293 seconds  ? ?   ?Assessment & Plan:  ? ?Problem List Items Addressed This Visit   ? ?  ? Cardiovascular and Mediastinum  ? HFrEF (heart failure with reduced ejection fraction) ([place]) - Primary  ?  Chronic. Followed by Cardiology. Continue with medication regimen and collaboration with Cardiology.  Labs ordered today.  ? ?  ?  ? Relevant Orders  ? Comp Met (CMET)  ? ?Other Visit Diagnoses   ? ? Anemia, unspecified type      ? CBC ordered today. Will make recommendations based on lab results.  ? Relevant Orders  ? CBC w/Diff  ? Callus between toes      ? 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 No evidence of infection. Will refer to podiatry for care of feet and callus.  ? Relevant Orders  ? Ambulatory referral to Podiatry  ? ?  ?  ? ?Follow up plan: ?Return in about 3 months (around 12/09/2021) for HTN, HLD, DM2  FU. ? ? ? ? ? ?

## 2021-09-08 ENCOUNTER — Encounter: Payer: Self-pay | Admitting: Nurse Practitioner

## 2021-09-08 ENCOUNTER — Ambulatory Visit (INDEPENDENT_AMBULATORY_CARE_PROVIDER_SITE_OTHER): Payer: Medicare Other | Admitting: Nurse Practitioner

## 2021-09-08 VITALS — BP 100/58 | HR 78 | Temp 98.1°F | Wt 150.6 lb

## 2021-09-08 DIAGNOSIS — I5020 Unspecified systolic (congestive) heart failure: Secondary | ICD-10-CM

## 2021-09-08 DIAGNOSIS — D649 Anemia, unspecified: Secondary | ICD-10-CM | POA: Diagnosis not present

## 2021-09-08 DIAGNOSIS — L84 Corns and callosities: Secondary | ICD-10-CM

## 2021-09-08 DIAGNOSIS — I255 Ischemic cardiomyopathy: Secondary | ICD-10-CM

## 2021-09-08 NOTE — Assessment & Plan Note (Signed)
Chronic. Followed by Cardiology. Continue with medication regimen and collaboration with Cardiology.  Labs ordered today.  ?

## 2021-09-09 DIAGNOSIS — M8589 Other specified disorders of bone density and structure, multiple sites: Principal | ICD-10-CM

## 2021-09-09 DIAGNOSIS — M05742 Rheumatoid arthritis with rheumatoid factor of left hand without organ or systems involvement: Principal | ICD-10-CM

## 2021-09-09 DIAGNOSIS — M059 Rheumatoid arthritis with rheumatoid factor, unspecified: Principal | ICD-10-CM

## 2021-09-09 DIAGNOSIS — M05741 Rheumatoid arthritis with rheumatoid factor of right hand without organ or systems involvement: Principal | ICD-10-CM

## 2021-09-09 DIAGNOSIS — M0579 Rheumatoid arthritis with rheumatoid factor of multiple sites without organ or systems involvement: Principal | ICD-10-CM

## 2021-09-09 LAB — COMPREHENSIVE METABOLIC PANEL
ALT: 18 IU/L (ref 0–32)
AST: 29 IU/L (ref 0–40)
Albumin/Globulin Ratio: 1.1 — ABNORMAL LOW (ref 1.2–2.2)
Albumin: 3.9 g/dL (ref 3.7–4.7)
Alkaline Phosphatase: 59 IU/L (ref 44–121)
BUN/Creatinine Ratio: 12 (ref 12–28)
BUN: 11 mg/dL (ref 8–27)
Bilirubin Total: 0.2 mg/dL (ref 0.0–1.2)
CO2: 25 mmol/L (ref 20–29)
Calcium: 9.1 mg/dL (ref 8.7–10.3)
Chloride: 99 mmol/L (ref 96–106)
Creatinine, Ser: 0.89 mg/dL (ref 0.57–1.00)
Globulin, Total: 3.5 g/dL (ref 1.5–4.5)
Glucose: 83 mg/dL (ref 70–99)
Potassium: 4.3 mmol/L (ref 3.5–5.2)
Sodium: 137 mmol/L (ref 134–144)
Total Protein: 7.4 g/dL (ref 6.0–8.5)
eGFR: 69 mL/min/{1.73_m2} (ref >59)

## 2021-09-09 LAB — CBC WITH DIFFERENTIAL/PLATELET
Basophils Absolute: 0.1 10*3/uL (ref 0.0–0.2)
Basos: 1 %
EOS (ABSOLUTE): 0.2 10*3/uL (ref 0.0–0.4)
Eos: 2 %
Hematocrit: 28.1 % — ABNORMAL LOW (ref 34.0–46.6)
Hemoglobin: 9 g/dL — ABNORMAL LOW (ref 11.1–15.9)
Immature Grans (Abs): 0.1 10*3/uL (ref 0.0–0.1)
Immature Granulocytes: 1 %
Lymphocytes Absolute: 2.8 10*3/uL (ref 0.7–3.1)
Lymphs: 27 %
MCH: 30.5 pg (ref 26.6–33.0)
MCHC: 32 g/dL (ref 31.5–35.7)
MCV: 95 fL (ref 79–97)
Monocytes Absolute: 0.9 10*3/uL (ref 0.1–0.9)
Monocytes: 9 %
Neutrophils Absolute: 6.3 10*3/uL (ref 1.4–7.0)
Neutrophils: 60 %
Platelets: 260 10*3/uL (ref 150–450)
RBC: 2.95 x10E6/uL — ABNORMAL LOW (ref 3.77–5.28)
RDW: 15.8 % — ABNORMAL HIGH (ref 11.7–15.4)
WBC: 10.4 10*3/uL (ref 3.4–10.8)

## 2021-09-09 MED ORDER — METHOTREXATE SODIUM 25 MG/ML INJECTION SOLUTION
SUBCUTANEOUS | 0 refills | 0 days
Start: 2021-09-09 — End: ?

## 2021-09-11 MED ORDER — METHOTREXATE SODIUM 25 MG/ML INJECTION SOLUTION
SUBCUTANEOUS | 0 refills | 84 days | Status: CP
Start: 2021-09-11 — End: ?

## 2021-09-11 NOTE — Progress Notes (Signed)
Hi Ms. Veronica Romero.  Your lab work shows that your anemia improved some from your last appt.  Your liver, kidneys and electrolytes look good.  Continue with your current medication regimen.  Follow up as discussed.

## 2021-09-12 ENCOUNTER — Encounter: Payer: Self-pay | Admitting: Pulmonary Disease

## 2021-09-12 ENCOUNTER — Ambulatory Visit (INDEPENDENT_AMBULATORY_CARE_PROVIDER_SITE_OTHER): Payer: Medicare Other | Admitting: Pulmonary Disease

## 2021-09-12 VITALS — BP 118/58 | HR 89 | Temp 97.8°F | Ht 67.0 in | Wt 152.2 lb

## 2021-09-12 DIAGNOSIS — I2729 Other secondary pulmonary hypertension: Secondary | ICD-10-CM | POA: Diagnosis not present

## 2021-09-12 DIAGNOSIS — J849 Interstitial pulmonary disease, unspecified: Secondary | ICD-10-CM

## 2021-09-12 DIAGNOSIS — G4736 Sleep related hypoventilation in conditions classified elsewhere: Secondary | ICD-10-CM

## 2021-09-12 DIAGNOSIS — R0602 Shortness of breath: Secondary | ICD-10-CM

## 2021-09-12 DIAGNOSIS — M0579 Rheumatoid arthritis with rheumatoid factor of multiple sites without organ or systems involvement: Secondary | ICD-10-CM

## 2021-09-12 DIAGNOSIS — I5020 Unspecified systolic (congestive) heart failure: Secondary | ICD-10-CM

## 2021-09-12 NOTE — Patient Instructions (Signed)
We are going to get a CT scan of the chest that is specialized to determine if you have significant issues with a rheumatoid arthritis in your lung. ? ?We also will get a breathing test that will tell us about the capacity of your lungs. ? ?We are going to get a oxygen level determination at nighttime OFF OF YOUR OXYGEN.  You may resume using your oxygen the following day and we will call you whether you need to keep using it or it can be discontinued. ? ?We will see you in follow-up in 3 months time call sooner should any new problems arise. ?

## 2021-09-12 NOTE — Progress Notes (Signed)
Subjective:    Patient ID: Veronica Romero, female    DOB: Oct 25, 1950, 71 y.o.   MRN: [id number] Patient Care Team: Jon Billings, NP as PCP - General (Nurse Practitioner) Berniece Salines, DO as PCP - Cardiology (Cardiology)  Chief Complaint  Patient presents with   pulmonary consult    D/c 07/18/2021- breathing has improved. No current sx.    HPI Mikle Bosworth is a very complex 71 year old lifelong never smoker with severe rheumatoid arthritis and a history as noted below, who presents for evaluation of shortness of breath.  She is kindly referred by Jon Billings, NP.  The patient had noted ongoing shortness of breath for approximately 4 months before she was admitted to [facility] on 11 June 2021 with acute respiratory failure and was diagnosed with acute on chronic systolic heart failure.  She was noted to have also significant coronary artery disease and an NSTEMI at that time.  She was also thought to have community-acquired pneumonia she had to be intubated and was extubated on 15 February with negative cultures on bronchoscopy done at that time.  Her LVEF was noted to be 25 to 30%.  She had a left and right heart cath on 17 February that showed a mid LAD lesion that was 85% stenosed, proximal RCA lesion 50% stenosed and an LPA V lesion 50% stenosed.  She was noted to have mild pulmonary hypertension with mean PA pressure of 22 mmHg and a wedge pressure of 14.  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  Chart at that time a medical management to optimize her status and was readmitted on 17 July 2021 for definitive management of her LAD lesion.  Since she had PCI to the LAD with DES x1 she has noted significant improvement on her shortness of breath.  She feels that this has now resolved completely.  She does not endorse any symptomatology in this regard today.  She has not had any cough, sputum production nor hemoptysis.  No chest pain.  No orthopnea or paroxysmal nocturnal dyspnea.  No  lower extremity edema.  She has a history of rheumatoid arthritis which is quite severe.  She is currently on methotrexate, prednisone and Humira.  She follows at [facility] for these issues.  No recent history of COVID-19.  Since her discharge in February from her hospitalization she has been on nocturnal oxygen.   Review of Systems A 10 point review of systems was performed and it is as noted above otherwise negative.  Past Medical History:  Diagnosis Date   Ischemic cardiomyopathy 06/12/2021   EF 25-30%   Rheumatoid arthritis [facility])    Past Surgical History:  Procedure Laterality Date   CORONARY ATHERECTOMY N/A 07/17/2021   Procedure: CORONARY ATHERECTOMY;  Surgeon: Martinique, Peter M, MD;  Location: [place] [facility];  Service: Cardiovascular;  Laterality: N/A;   RIGHT/LEFT HEART CATH AND CORONARY ANGIOGRAPHY N/A 06/16/2021   Procedure: RIGHT/LEFT HEART CATH AND CORONARY ANGIOGRAPHY;  Surgeon: Belva Crome, MD;  Location: [place] [facility];  Service: Cardiovascular;  Laterality: N/A;   Patient Active Problem List   Diagnosis Date Noted   Coronary artery disease involving native coronary artery of native heart without angina pectoris 08/11/2021   CAD (coronary artery disease) 07/17/2021   HFrEF (heart failure with reduced ejection fraction) ([place]) 07/13/2021   Medication management 07/13/2021   Ischemic cardiomyopathy 07/13/2021   Mild pulmonary hypertension ([place]) 07/13/2021   Mixed hyperlipidemia 07/13/2021   Hiatal hernia 07/10/2021   Atherosclerosis of  aorta (HCC) 07/10/2021   NSTEMI (non-ST elevated myocardial infarction) (HCC) 06/21/2021   Alcohol use 06/21/2021   Acute on chronic systolic heart failure (HCC) 06/16/2021   CAD (coronary artery disease), native coronary artery 06/16/2021   Acute respiratory failure with hypoxia and hypercapnia (HCC) 06/12/2021   Acute respiratory failure (HCC) 06/12/2021   Malnutrition of moderate degree 06/12/2021   Multifocal pneumonia  06/11/2021   Chest pain 06/11/2021   Dyspnea 06/11/2021   Rheumatoid arthritis (HCC) 06/11/2021   Family History  Problem Relation Age of Onset   Heart disease Father    Kidney disease Brother    Autoimmune disease Daughter    Social History   Tobacco Use   Smoking status: Never   Smokeless tobacco: Never  Substance Use Topics   Alcohol use: Yes    Alcohol/week: 2.0 standard drinks    Types: 2 Glasses of wine per week   Allergies  Allergen Reactions   Penicillins Rash   Current Meds  Medication Sig   Adalimumab (HUMIRA PEN) 40 MG/0.4ML PNKT Inject 0.4 mLs into the skin every Tuesday. Tuesday   aspirin 81 MG EC tablet Take 1 tablet (81 mg total) by mouth daily. Swallow whole.   carvedilol (COREG) 3.125 MG tablet Take 1 tablet (3.125 mg total) by mouth 2 (two) times daily with a meal.   Cholecalciferol (VITAMIN D3 PO) Take 1 tablet by mouth daily.   clopidogrel (PLAVIX) 75 MG tablet Take 1 tablet (75 mg total) by mouth daily.   empagliflozin (JARDIANCE) 10 MG TABS tablet Take 1 tablet (10 mg total) by mouth daily before breakfast.   furosemide (LASIX) 40 MG tablet Take 1 tablet (40 mg total) by mouth every other day.   leucovorin (WELLCOVORIN) 5 MG tablet Take 15 mg by mouth every Saturday.   losartan (COZAAR) 25 MG tablet Take 1/2 tablet (12.5 mg total) by mouth daily.   methotrexate 50 MG/2ML injection Inject 1 mL (25 mg total) into the muscle every Saturday. Hold methotrexate for 2 more weeks and then resume 3/10   Multiple Vitamin (MULTIVITAMIN WITH MINERALS) TABS tablet Take 1 tablet by mouth daily.   nitroGLYCERIN (NITROSTAT) 0.4 MG SL tablet Place 1 tablet (0.4 mg total) under the tongue every 5 (five) minutes as needed.   Polyethyl Glycol-Propyl Glycol (LUBRICANT EYE DROPS) 0.4-0.3 % SOLN Place 1 drop into both eyes 3 (three) times daily as needed (dry/irritated eyes.).   potassium chloride SA (KLOR-CON M) 20 MEQ tablet Take 1 tablet (20 mEq total) by mouth daily.    predniSONE (DELTASONE) 5 MG tablet Take 10 mg by mouth in the morning.   rosuvastatin (CRESTOR) 40 MG tablet Take 1 tablet (40 mg total) by mouth daily.   spironolactone (ALDACTONE) 25 MG tablet Take 1/2 tablet (12.5 mg total) by mouth daily.   traMADol (ULTRAM) 50 MG tablet Take 50 mg by mouth every 6 (six) hours as needed (pain.).   Immunization History  Administered Date(s) Administered   Influenza,inj,Quad PF,6+ Mos 01/21/2014, 02/01/2015, 02/09/2016, 02/21/2017, 02/13/2018   Influenza-Unspecified 03/11/2019, 02/10/2020, 03/02/2021   PFIZER(Purple Top)SARS-COV-2 Vaccination 06/25/2019, 07/16/2019, 03/01/2020   Pneumococcal Conjugate-13 01/27/2015   Pneumococcal Polysaccharide-23 02/09/2016      Objective:   Physical Exam BP (!) 118/58 (BP Location: Left Arm, Cuff Size: Normal)   Pulse 89   Temp 97.8 F (36.6 C) (Temporal)   Ht 5\' 7"  (1.702 m)   Wt 152 lb 3.2 oz (69 kg)   SpO2 99%   BMI 23.84 kg/m  GENERAL: Well-developed, thin woman, well-groomed, no acute distress.  Fully ambulatory.  No conversational dyspnea. HEAD: Normocephalic, atraumatic.  Wears hearing aid. EYES: Pupils equal, round, reactive to light.  No scleral icterus.  MOUTH: Oral mucosa moist, no thrush. NECK: Supple. No thyromegaly. Trachea midline. No JVD.  No adenopathy. PULMONARY: Good air entry bilaterally.  No adventitious sounds. CARDIOVASCULAR: S1 and S2. Regular rate and rhythm.  No rubs, murmurs or gallops heard. ABDOMEN: Benign. MUSCULOSKELETAL: Severe stigmata of rheumatoid arthritis particularly on both hands with swan-neck deformities and ulnar deviation, no clubbing, no edema.  NEUROLOGIC: No overt focal deficit, no gait disturbance, speech is fluent. SKIN: Intact,warm,dry. PSYCH: Mood and behavior normal.   Representative image of CT chest performed 18 June 2021 showing interstitial changes and groundglass opacities, independently reviewed:   Chest x-ray performed 21 June 2021 showing  low lung volumes and persistent interstitial infiltration, independently reviewed:     Assessment & Plan:     ICD-10-CM   1. ILD (interstitial lung disease) (HCC)  J84.9 CT Chest High Resolution    Pulse oximetry, overnight   Suspect secondary to rheumatoid lung High-resolution CT chest PFTs    2. Shortness of breath  R06.02 Pulmonary Function Test [facility] Only   Resolved after intervention in LAD Reassess with PFTs    3. 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 HFrEF (heart failure with reduced ejection fraction) (HCC)  I50.20    This issue adds complexity to her management LVEF 05/2021 25 to 30% Followed by cardiology    5. Rheumatoid arthritis involving multiple sites with positive rheumatoid factor (HCC)  M05.79    This issue adds complexity to her management Imaging findings may be related to rheumatoid lung Evaluation as above Managed by [facility] rheumatology     Orders Placed This Encounter  Procedures   CT Chest High Resolution    Standing Status:   Future    Standing Expiration Date:   03/15/2022    Scheduling Instructions:     Next available.    Order Specific Question:   Preferred imaging location?    Answer:    [facility]   Pulse oximetry, overnight    On roomair  HXT:[id number]    Standing Status:   Future    Standing Expiration Date:   09/13/2022   Pulmonary Function Test [facility] Only    Next available.    Standing Status:   Future    Standing Expiration Date:   09/13/2022    Order Specific Question:   Full PFT: includes the following: basic spirometry, spirometry pre & post bronchodilator, diffusion capacity (DLCO), lung volumes    Answer:   Full PFT   Has done well after her recent stenting to the LAD.  We will need to assess her pulmonary function and establish baseline for her pulmonary findings on CT.  I suspect that she has significant issues with  rheumatoid lung.  We will see the patient in follow-up in 3 months time she is to contact us prior to that time should any new difficulties arise.  Gailen Shelter, MD Advanced Bronchoscopy PCCM [facility]    *This note was dictated using voice recognition software/Dragon.  Despite best efforts to proofread, errors can occur which can change the meaning. Any transcriptional errors that result from this process are unintentional and may not be fully corrected at the time of dictation.

## 2021-09-18 ENCOUNTER — Encounter: Payer: Self-pay | Admitting: Pulmonary Disease

## 2021-09-19 ENCOUNTER — Ambulatory Visit: Payer: Medicare Other | Admitting: Podiatry

## 2021-09-26 ENCOUNTER — Ambulatory Visit
Admission: RE | Admit: 2021-09-26 | Discharge: 2021-09-26 | Disposition: A | Discharge status: Home / Self Care | Payer: Medicare Other | Source: Ambulatory Visit | Attending: Pulmonary Disease | Admitting: Pulmonary Disease

## 2021-09-26 DIAGNOSIS — J849 Interstitial pulmonary disease, unspecified: Secondary | ICD-10-CM | POA: Diagnosis present

## 2021-09-30 DIAGNOSIS — M0579 Rheumatoid arthritis with rheumatoid factor of multiple sites without organ or systems involvement: Principal | ICD-10-CM

## 2021-09-30 DIAGNOSIS — M059 Rheumatoid arthritis with rheumatoid factor, unspecified: Principal | ICD-10-CM

## 2021-09-30 DIAGNOSIS — M05741 Rheumatoid arthritis with rheumatoid factor of right hand without organ or systems involvement: Principal | ICD-10-CM

## 2021-09-30 DIAGNOSIS — M05742 Rheumatoid arthritis with rheumatoid factor of left hand without organ or systems involvement: Principal | ICD-10-CM

## 2021-09-30 DIAGNOSIS — M05722 Rheumatoid arthritis with rheumatoid factor of left elbow without organ or systems involvement: Principal | ICD-10-CM

## 2021-09-30 DIAGNOSIS — M8589 Other specified disorders of bone density and structure, multiple sites: Principal | ICD-10-CM

## 2021-09-30 DIAGNOSIS — M05721 Rheumatoid arthritis with rheumatoid factor of right elbow without organ or systems involvement: Principal | ICD-10-CM

## 2021-09-30 MED ORDER — PREDNISONE 5 MG TABLET
ORAL_TABLET | 2 refills | 0 days
Start: 2021-09-30 — End: ?

## 2021-10-02 MED ORDER — PREDNISONE 5 MG TABLET
ORAL_TABLET | 2 refills | 0 days | Status: CP
Start: 2021-10-02 — End: ?

## 2021-10-04 ENCOUNTER — Ambulatory Visit (INDEPENDENT_AMBULATORY_CARE_PROVIDER_SITE_OTHER): Payer: Medicare Other | Admitting: Podiatry

## 2021-10-04 ENCOUNTER — Encounter: Payer: Self-pay | Admitting: Podiatry

## 2021-10-04 DIAGNOSIS — M21612 Bunion of left foot: Secondary | ICD-10-CM

## 2021-10-04 DIAGNOSIS — M2011 Hallux valgus (acquired), right foot: Secondary | ICD-10-CM | POA: Diagnosis not present

## 2021-10-04 DIAGNOSIS — M2041 Other hammer toe(s) (acquired), right foot: Secondary | ICD-10-CM

## 2021-10-04 DIAGNOSIS — M2012 Hallux valgus (acquired), left foot: Secondary | ICD-10-CM | POA: Diagnosis not present

## 2021-10-04 DIAGNOSIS — M21611 Bunion of right foot: Secondary | ICD-10-CM

## 2021-10-04 DIAGNOSIS — M069 Rheumatoid arthritis, unspecified: Secondary | ICD-10-CM

## 2021-10-04 DIAGNOSIS — M2042 Other hammer toe(s) (acquired), left foot: Secondary | ICD-10-CM | POA: Diagnosis not present

## 2021-10-04 NOTE — Patient Instructions (Signed)
More silicone pads can be purchased from:  [email or web address]  

## 2021-10-04 NOTE — Progress Notes (Signed)
  Subjective:  Patient ID: Veronica Romero, female    DOB: [birth year],  MRN: [id number]  Chief Complaint  Patient presents with   Callouses    "My big toe, the bent toe next to it rubs it.  It's raw in between them."    71 y.o. female presents with the above complaint. History confirmed with patient.  She has had rheumatoid arthritis since she was in her 72s.  This is caused severe deformities in the hands and feet.  Her primary complaint is a corn that is worse on the right side where the big toe rubs against the second toe knuckle.  Objective:  Physical Exam: warm, good capillary refill, no trophic changes or ulcerative lesions, normal DP and PT pulses, normal sensory exam, and venous insufficiency noted with varicose veins, she has bilateral right worse than left severe hallux valgus deformity, rigidity and likely arthrosis of the first MPJ, claw toe deformities with contractures and prominent DIPJ's, kissing corn lesions in the first interspace on the hallux and second toe.   Assessment:   1. Rheumatoid arthritis of right foot, unspecified whether rheumatoid factor present (HCC)   2. Hallux valgus with bunions, left   3. Hallux valgus with bunions, right   4. Hammertoe of left foot   5. Hammertoe of right foot      Plan:  Patient was evaluated and treated and all questions answered.  I discussed with her how her rheumatoid arthritis has contributed to the foot deformities and the bony deformities are leading to the severe calluses and pain.  I trimmed the corn as a courtesy today.  We discussed offloading these with silicone pads and I dispensed multiple types of these to allow her to see which solution works best for her and she can get these over-the-counter or online for the future.  We also discussed surgical correction of the issues with Harlow Mares procedure but she would like to try nonsurgical treatment first.  I will see her back as needed we will take radiographs if she  is interested in surgical correction if this fails.  Return if symptoms worsen or fail to improve.

## 2021-10-06 ENCOUNTER — Encounter: Payer: Medicare Other | Attending: Cardiology | Admitting: *Deleted

## 2021-10-06 DIAGNOSIS — I252 Old myocardial infarction: Secondary | ICD-10-CM | POA: Insufficient documentation

## 2021-10-06 DIAGNOSIS — Z48812 Encounter for surgical aftercare following surgery on the circulatory system: Secondary | ICD-10-CM | POA: Insufficient documentation

## 2021-10-06 DIAGNOSIS — I214 Non-ST elevation (NSTEMI) myocardial infarction: Secondary | ICD-10-CM

## 2021-10-06 DIAGNOSIS — Z955 Presence of coronary angioplasty implant and graft: Secondary | ICD-10-CM | POA: Insufficient documentation

## 2021-10-06 NOTE — Progress Notes (Signed)
Virtual orientation call completed today. shehas an appointment on Date: 10/17/2021  for EP eval and gym Orientation.  Documentation of diagnosis can be found in [facility] Date: 07/17/2021 .

## 2021-10-09 ENCOUNTER — Other Ambulatory Visit: Payer: Self-pay | Admitting: Cardiology

## 2021-10-09 ENCOUNTER — Other Ambulatory Visit (HOSPITAL_COMMUNITY): Payer: Self-pay

## 2021-10-10 MED FILL — BD TUBERCULIN SYRINGE 1 ML 27 X 1/2": 84 days supply | Qty: 12 | Fill #1

## 2021-10-10 NOTE — Telephone Encounter (Signed)
This is Dr. Tobb's pt 

## 2021-10-11 ENCOUNTER — Other Ambulatory Visit (HOSPITAL_COMMUNITY): Payer: Self-pay

## 2021-10-11 MED ORDER — ROSUVASTATIN CALCIUM 40 MG PO TABS
40.0000 mg | ORAL_TABLET | Freq: Every day | ORAL | 0 refills | Status: DC
  Filled 2021-10-11: qty 90, 90d supply, fill #0

## 2021-10-11 MED ORDER — POTASSIUM CHLORIDE CRYS ER 20 MEQ PO TBCR
20.0000 meq | EXTENDED_RELEASE_TABLET | Freq: Every day | ORAL | 1 refills | Status: DC
  Filled 2021-10-11: qty 30, 30d supply, fill #0
  Filled 2021-11-11: qty 30, 30d supply, fill #1

## 2021-10-16 ENCOUNTER — Other Ambulatory Visit (HOSPITAL_COMMUNITY): Payer: Self-pay

## 2021-10-17 ENCOUNTER — Encounter: Payer: Medicare Other | Admitting: *Deleted

## 2021-10-17 VITALS — Ht 66.0 in | Wt 156.4 lb

## 2021-10-17 DIAGNOSIS — M05722 Rheumatoid arthritis with rheumatoid factor of left elbow without organ or systems involvement: Principal | ICD-10-CM

## 2021-10-17 DIAGNOSIS — M0579 Rheumatoid arthritis with rheumatoid factor of multiple sites without organ or systems involvement: Principal | ICD-10-CM

## 2021-10-17 DIAGNOSIS — M05742 Rheumatoid arthritis with rheumatoid factor of left hand without organ or systems involvement: Principal | ICD-10-CM

## 2021-10-17 DIAGNOSIS — M05741 Rheumatoid arthritis with rheumatoid factor of right hand without organ or systems involvement: Principal | ICD-10-CM

## 2021-10-17 DIAGNOSIS — M059 Rheumatoid arthritis with rheumatoid factor, unspecified: Principal | ICD-10-CM

## 2021-10-17 DIAGNOSIS — M8589 Other specified disorders of bone density and structure, multiple sites: Principal | ICD-10-CM

## 2021-10-17 DIAGNOSIS — M05721 Rheumatoid arthritis with rheumatoid factor of right elbow without organ or systems involvement: Principal | ICD-10-CM

## 2021-10-17 DIAGNOSIS — Z48812 Encounter for surgical aftercare following surgery on the circulatory system: Secondary | ICD-10-CM | POA: Diagnosis not present

## 2021-10-17 DIAGNOSIS — I252 Old myocardial infarction: Secondary | ICD-10-CM | POA: Diagnosis not present

## 2021-10-17 DIAGNOSIS — Z955 Presence of coronary angioplasty implant and graft: Secondary | ICD-10-CM | POA: Diagnosis present

## 2021-10-17 DIAGNOSIS — I214 Non-ST elevation (NSTEMI) myocardial infarction: Secondary | ICD-10-CM

## 2021-10-17 MED ORDER — PREDNISONE 5 MG TABLET
ORAL_TABLET | Freq: Every day | ORAL | 3 refills | 30 days | Status: CP
Start: 2021-10-17 — End: ?

## 2021-10-17 NOTE — Patient Instructions (Signed)
Patient Instructions  Patient Details  Name: Veronica Romero MRN: [id number] Date of Birth: Apr 27, 1951 Referring Provider:  Thomasene Ripple, DO  Below are your personal goals for exercise, nutrition, and risk factors. Our goal is to help you stay on track towards obtaining and maintaining these goals. We will be discussing your progress on these goals with you throughout the program.  Initial Exercise Prescription:  Initial Exercise Prescription - 10/17/21 1200       Date of Initial Exercise RX and Referring Provider   Date 10/17/21    Referring Provider Lavona [place] Tobb      Oxygen   Maintain Oxygen Saturation 88% or higher      Recumbant Bike   Level 1    RPM 50    Minutes 15    METs 2.19      NuStep   Level 1    SPM 80    Minutes 15    METs 2.19      REL-XR   Level 1    Speed 50    Minutes 15    METs 2.19      Track   Laps 22    Minutes 15    METs 2.2      Prescription Details   Frequency (times per week) 2    Duration Progress to 30 minutes of continuous aerobic without signs/symptoms of physical distress      Intensity   THRR 40-80% of Max Heartrate 100-132    Ratings of Perceived Exertion 11-13    Perceived Dyspnea 0-4      Progression   Progression Continue to progress workloads to maintain intensity without signs/symptoms of physical distress.      Resistance Training   Training Prescription Yes    Weight 2    Reps 10-15             Exercise Goals: Frequency: Be able to perform aerobic exercise two to three times per week in program working toward 2-5 days per week of home exercise.  Intensity: Work with a perceived exertion of 11 (fairly light) - 15 (hard) while following your exercise prescription.  We will make changes to your prescription with you as you progress through the program.   Duration: Be able to do 30 to 45 minutes of continuous aerobic exercise in addition to a 5 minute warm-up and a 5 minute cool-down routine.   Nutrition  Goals: Your personal nutrition goals will be established when you do your nutrition analysis with the dietician.  The following are general nutrition guidelines to follow: Cholesterol < 200mg /day Sodium < 1500mg /day Fiber: Women over 50 yrs - 21 grams per day  Personal Goals:  Personal Goals and Risk Factors at Admission - 10/17/21 1208       Core Components/Risk Factors/Patient Goals on Admission    Weight Management Yes    Intervention Weight Management: Develop a combined nutrition and exercise program designed to reach desired caloric intake, while maintaining appropriate intake of nutrient and fiber, sodium and fats, and appropriate energy expenditure required for the weight goal.;Weight Management: Provide education and appropriate resources to help participant work on and attain dietary goals.    Admit Weight 156 lb 6.4 oz (70.9 kg)    Goal Weight: Short Term 150 lb (68 kg)    Goal Weight: Long Term 150 lb (68 kg)    Expected Outcomes Short Term: Continue to assess and modify interventions until short term weight is achieved;Long Term: Adherence to nutrition and  physical activity/exercise program aimed toward attainment of established weight goal;Weight Maintenance: Understanding of the daily nutrition guidelines, which includes 25-35% calories from fat, 7% or less cal from saturated fats, less than 200mg  cholesterol, less than 1.5gm of sodium, & 5 or more servings of fruits and vegetables daily;Understanding recommendations for meals to include 15-35% energy as protein, 25-35% energy from fat, 35-60% energy from carbohydrates, less than 200mg  of dietary cholesterol, 20-35 gm of total fiber daily;Understanding of distribution of calorie intake throughout the day with the consumption of 4-5 meals/snacks    Heart Failure Yes    Intervention Provide a combined exercise and nutrition program that is supplemented with education, support and counseling about heart failure. Directed toward  relieving symptoms such as shortness of breath, decreased exercise tolerance, and extremity edema.    Expected Outcomes Improve functional capacity of life;Short term: Attendance in program 2-3 days a week with increased exercise capacity. Reported lower sodium intake. Reported increased fruit and vegetable intake. Reports medication compliance.;Short term: Daily weights obtained and reported for increase. Utilizing diuretic protocols set by physician.;Long term: Adoption of self-care skills and reduction of barriers for early signs and symptoms recognition and intervention leading to self-care maintenance.    Lipids Yes    Intervention Provide education and support for participant on nutrition & aerobic/resistive exercise along with prescribed medications to achieve LDL 70mg , HDL >40mg .    Expected Outcomes Short Term: Participant states understanding of desired cholesterol values and is compliant with medications prescribed. Participant is following exercise prescription and nutrition guidelines.;Long Term: Cholesterol controlled with medications as prescribed, with individualized exercise RX and with personalized nutrition plan. Value goals: LDL < 70mg , HDL > 40 mg.             Tobacco Use Initial Evaluation: Social History   Tobacco Use  Smoking Status Never  Smokeless Tobacco Never    Exercise Goals and Review:  Exercise Goals     Row Name 10/17/21 1206             Exercise Goals   Increase Physical Activity Yes       Intervention Provide advice, education, support and counseling about physical activity/exercise needs.;Develop an individualized exercise prescription for aerobic and resistive training based on initial evaluation findings, risk stratification, comorbidities and participant's personal goals.       Expected Outcomes Short Term: Attend rehab on a regular basis to increase amount of physical activity.;Long Term: Add in home exercise to make exercise part of routine  and to increase amount of physical activity.;Long Term: Exercising regularly at least 3-5 days a week.       Increase Strength and Stamina Yes       Intervention Provide advice, education, support and counseling about physical activity/exercise needs.;Develop an individualized exercise prescription for aerobic and resistive training based on initial evaluation findings, risk stratification, comorbidities and participant's personal goals.       Expected Outcomes Short Term: Increase workloads from initial exercise prescription for resistance, speed, and METs.;Short Term: Perform resistance training exercises routinely during rehab and add in resistance training at home;Long Term: Improve cardiorespiratory fitness, muscular endurance and strength as measured by increased METs and functional capacity ( )       Able to understand and use rate of perceived exertion (RPE) scale Yes       Intervention Provide education and explanation on how to use RPE scale       Expected Outcomes Short Term: Able to use RPE daily in rehab to  express subjective intensity level;Long Term:  Able to use RPE to guide intensity level when exercising independently       Able to understand and use Dyspnea scale Yes       Intervention Provide education and explanation on how to use Dyspnea scale       Expected Outcomes Short Term: Able to use Dyspnea scale daily in rehab to express subjective sense of shortness of breath during exertion;Long Term: Able to use Dyspnea scale to guide intensity level when exercising independently       Knowledge and understanding of Target Heart Rate Range (THRR) Yes       Intervention Provide education and explanation of THRR including how the numbers were predicted and where they are located for reference       Expected Outcomes Short Term: Able to state/look up THRR;Long Term: Able to use THRR to govern intensity when exercising independently;Short Term: Able to use daily as guideline for intensity  in rehab       Able to check pulse independently Yes       Intervention Provide education and demonstration on how to check pulse in carotid and radial arteries.;Review the importance of being able to check your own pulse for safety during independent exercise       Expected Outcomes Short Term: Able to explain why pulse checking is important during independent exercise;Long Term: Able to check pulse independently and accurately       Understanding of Exercise Prescription Yes       Intervention Provide education, explanation, and written materials on patient's individual exercise prescription       Expected Outcomes Short Term: Able to explain program exercise prescription;Long Term: Able to explain home exercise prescription to exercise independently                Copy of goals given to participant.

## 2021-10-17 NOTE — Progress Notes (Signed)
Cardiac Individual Treatment Plan  Patient Details  Name: Veronica Romero MRN: [id number] Date of Birth: [birth year] Referring Provider:   Flowsheet Row Cardiac Rehab from 10/17/2021 in [facility] Cardiac and Pulmonary Rehab  Referring Provider Berniece Salines       Initial Encounter Date:  Flowsheet Row Cardiac Rehab from 10/17/2021 in [facility] Cardiac and Pulmonary Rehab  Date 10/17/21       Visit Diagnosis: NSTEMI (non-ST elevation myocardial infarction) [facility])  Status post coronary artery stent placement  Patient's Home Medications on Admission:  Current Outpatient Medications:    Adalimumab (HUMIRA PEN) 40 MG/0.4ML PNKT, Inject 0.4 mLs into the skin every Tuesday. Tuesday, Disp: , Rfl:    aspirin 81 MG EC tablet, Take 1 tablet (81 mg total) by mouth daily. Swallow whole., Disp: 90 tablet, Rfl: 1   carvedilol (COREG) 3.125 MG tablet, Take 1 tablet (3.125 mg total) by mouth 2 (two) times daily with a meal., Disp: 180 tablet, Rfl: 2   Cholecalciferol (VITAMIN D3 PO), Take 1 tablet by mouth daily., Disp: , Rfl:    clopidogrel (PLAVIX) 75 MG tablet, Take 1 tablet (75 mg total) by mouth daily., Disp: 90 tablet, Rfl: 2   empagliflozin (JARDIANCE) 10 MG TABS tablet, Take 1 tablet (10 mg total) by mouth daily before breakfast. (Patient not taking: Reported on 10/06/2021), Disp: 90 tablet, Rfl: 3   furosemide (LASIX) 40 MG tablet, Take 1 tablet (40 mg total) by mouth every other day., Disp: 45 tablet, Rfl: 3   leucovorin (WELLCOVORIN) 5 MG tablet, Take 15 mg by mouth every Saturday., Disp: , Rfl:    losartan (COZAAR) 25 MG tablet, Take 1/2 tablet (12.5 mg total) by mouth daily., Disp: 30 tablet, Rfl: 2   methotrexate 50 MG/2ML injection, Inject 1 mL (25 mg total) into the muscle every Saturday. Hold methotrexate for 2 more weeks and then resume 3/10, Disp: , Rfl:    Multiple Vitamin (MULTIVITAMIN WITH MINERALS) TABS tablet, Take 1 tablet by mouth daily., Disp: , Rfl:    nitroGLYCERIN (NITROSTAT) 0.4 MG  SL tablet, Place 1 tablet (0.4 mg total) under the tongue every 5 (five) minutes as needed., Disp: 25 tablet, Rfl: 2   Polyethyl Glycol-Propyl Glycol (LUBRICANT EYE DROPS) 0.4-0.3 % SOLN, Place 1 drop into both eyes 3 (three) times daily as needed (dry/irritated eyes.)., Disp: , Rfl:    potassium chloride SA (KLOR-CON M) 20 MEQ tablet, Take 1 tablet (20 mEq total) by mouth daily., Disp: 30 tablet, Rfl: 1   predniSONE (DELTASONE) 5 MG tablet, Take 10 mg by mouth in the morning., Disp: , Rfl:    rosuvastatin (CRESTOR) 40 MG tablet, Take 1 tablet (40 mg total) by mouth daily., Disp: 90 tablet, Rfl: 0   spironolactone (ALDACTONE) 25 MG tablet, Take 1/2 tablet (12.5 mg total) by mouth daily., Disp: 30 tablet, Rfl: 2   traMADol (ULTRAM) 50 MG tablet, Take 50 mg by mouth every 6 (six) hours as needed (pain.)., Disp: , Rfl:   Past Medical History: Past Medical History:  Diagnosis Date   Ischemic cardiomyopathy 06/12/2021   EF 25-30%   Rheumatoid arthritis ([place])     Tobacco Use: Social History   Tobacco Use  Smoking Status Never  Smokeless Tobacco Never    Labs: Review Flowsheet       Latest Ref Rng & Units 06/12/2021 06/13/2021 06/16/2021  Labs for ITP Cardiac and Pulmonary Rehab  Cholestrol 0 - 200 mg/dL - - 177   LDL (calc) 0 - 99  mg/dL - - 116   HDL-C >40 mg/dL - - 38   Trlycerides <150 mg/dL - 66  115   Hemoglobin A1c 4.8 - 5.6 % 5.4  - -  PH, Arterial 7.35 - 7.45 7.451  7.155  7.359  7.471  7.518  7.507  7.513   PCO2 arterial 32 - 48 mmHg 39.0  72.5  41.8  29.8  37.0  38.0  39.7   Bicarbonate 20.0 - 28.0 mmol/L 26.6  24.7  23.7  22.0  30.1  30.1  31.9   TCO2 22 - 32 mmol/L $RemoveB'28  27  25  23  31  31  'MIKedntX$ 33   Acid-base deficit 0.0 - 2.0 mmol/L 4.0  2.0  1.0  -  O2 Saturation % 100.0  94.0  97.0  96.0  94  70  68      Exercise Target Goals: Exercise Program Goal: Individual exercise prescription set using results from initial 6 min walk test and THRR while considering  patient's  activity barriers and safety.   Exercise Prescription Goal: Initial exercise prescription builds to 30-45 minutes a day of aerobic activity, 2-3 days per week.  Home exercise guidelines will be given to patient during program as part of exercise prescription that the participant will acknowledge.   Education: Aerobic Exercise: - Group verbal and visual presentation on the components of exercise prescription. Introduces F.I.T.T principle from ACSM for exercise prescriptions.  Reviews F.I.T.T. principles of aerobic exercise including progression. Written material given at graduation. Flowsheet Row Cardiac Rehab from 10/17/2021 in [facility] Cardiac and Pulmonary Rehab  Education need identified 10/17/21       Education: Resistance Exercise: - Group verbal and visual presentation on the components of exercise prescription. Introduces F.I.T.T principle from ACSM for exercise prescriptions  Reviews F.I.T.T. principles of resistance exercise including progression. Written material given at graduation.    Education: Exercise & Equipment Safety: - Individual verbal instruction and demonstration of equipment use and safety with use of the equipment. Flowsheet Row Cardiac Rehab from 10/17/2021 in [facility] Cardiac and Pulmonary Rehab  Date 10/17/21  Educator [facility]  Instruction Review Code 1- Verbalizes Understanding       Education: Exercise Physiology & General Exercise Guidelines: - Group verbal and written instruction with models to review the exercise physiology of the cardiovascular system and associated critical values. Provides general exercise guidelines with specific guidelines to those with heart or lung disease.    Education: Flexibility, Balance, Mind/Body Relaxation: - Group verbal and visual presentation with interactive activity on the components of exercise prescription. Introduces F.I.T.T principle from ACSM for exercise prescriptions. Reviews F.I.T.T. principles of flexibility and balance  exercise training including progression. Also discusses the mind body connection.  Reviews various relaxation techniques to help reduce and manage stress (i.e. Deep breathing, progressive muscle relaxation, and visualization). Balance handout provided to take home. Written material given at graduation.   Activity Barriers & Risk Stratification:  Activity Barriers & Cardiac Risk Stratification - 10/06/21 1316       Activity Barriers & Cardiac Risk Stratification   Activity Barriers Arthritis    Cardiac Risk Stratification Moderate             6 Minute Walk:  6 Minute Walk     Row Name 10/17/21 1200         6 Minute Walk   Phase Initial     Distance 850 feet     Walk Time 6 minutes     # of  Rest Breaks 0     MPH 1.6     METS 2.19     RPE 9     Perceived Dyspnea  0     VO2 Peak 7.68     Symptoms Yes (comment)     Comments arthritic foot pain 4/10     Resting HR 69 bpm     Resting BP 136/74     Resting Oxygen Saturation  98 %     Exercise Oxygen Saturation  during 6 min walk 97 %     Max Ex. HR 100 bpm     Max Ex. BP 142/70     2 Minute Post BP 130/80              Oxygen Initial Assessment:   Oxygen Re-Evaluation:   Oxygen Discharge (Final Oxygen Re-Evaluation):   Initial Exercise Prescription:  Initial Exercise Prescription - 10/17/21 1200       Date of Initial Exercise RX and Referring Provider   Date 10/17/21    Referring Provider Lavona [place] Tobb      Oxygen   Maintain Oxygen Saturation 88% or higher      Recumbant Bike   Level 1    RPM 50    Minutes 15    METs 2.19      NuStep   Level 1    SPM 80    Minutes 15    METs 2.19      REL-XR   Level 1    Speed 50    Minutes 15    METs 2.19      Track   Laps 22    Minutes 15    METs 2.2      Prescription Details   Frequency (times per week) 2    Duration Progress to 30 minutes of continuous aerobic without signs/symptoms of physical distress      Intensity   THRR 40-80% of Max  Heartrate 100-132    Ratings of Perceived Exertion 11-13    Perceived Dyspnea 0-4      Progression   Progression Continue to progress workloads to maintain intensity without signs/symptoms of physical distress.      Resistance Training   Training Prescription Yes    Weight 2    Reps 10-15             Perform Capillary Blood Glucose checks as needed.  Exercise Prescription Changes:   Exercise Prescription Changes     Row Name 10/17/21 1200             Response to Exercise   Blood Pressure (Admit) 136/74       Blood Pressure (Exercise) 142/70       Blood Pressure (Exit) 130/80       Heart Rate (Admit) 69 bpm       Heart Rate (Exercise) 100 bpm       Heart Rate (Exit) 74 bpm       Oxygen Saturation (Admit) 98 %       Oxygen Saturation (Exercise) 97 %       Oxygen Saturation (Exit) 98 %       Rating of Perceived Exertion (Exercise) 9       Perceived Dyspnea (Exercise) 0       Symptoms foot pain (4/10)       Comments 6 MWT results                Exercise Comments:   Exercise Goals and  Review:   Exercise Goals     Row Name 10/17/21 1206             Exercise Goals   Increase Physical Activity Yes       Intervention Provide advice, education, support and counseling about physical activity/exercise needs.;Develop an individualized exercise prescription for aerobic and resistive training based on initial evaluation findings, risk stratification, comorbidities and participant's personal goals.       Expected Outcomes Short Term: Attend rehab on a regular basis to increase amount of physical activity.;Long Term: Add in home exercise to make exercise part of routine and to increase amount of physical activity.;Long Term: Exercising regularly at least 3-5 days a week.       Increase Strength and Stamina Yes       Intervention Provide advice, education, support and counseling about physical activity/exercise needs.;Develop an individualized exercise prescription  for aerobic and resistive training based on initial evaluation findings, risk stratification, comorbidities and participant's personal goals.       Expected Outcomes Short Term: Increase workloads from initial exercise prescription for resistance, speed, and METs.;Short Term: Perform resistance training exercises routinely during rehab and add in resistance training at home;Long Term: Improve cardiorespiratory fitness, muscular endurance and strength as measured by increased METs and functional capacity ( )       Able to understand and use rate of perceived exertion (RPE) scale Yes       Intervention Provide education and explanation on how to use RPE scale       Expected Outcomes Short Term: Able to use RPE daily in rehab to express subjective intensity level;Long Term:  Able to use RPE to guide intensity level when exercising independently       Able to understand and use Dyspnea scale Yes       Intervention Provide education and explanation on how to use Dyspnea scale       Expected Outcomes Short Term: Able to use Dyspnea scale daily in rehab to express subjective sense of shortness of breath during exertion;Long Term: Able to use Dyspnea scale to guide intensity level when exercising independently       Knowledge and understanding of Target Heart Rate Range (THRR) Yes       Intervention Provide education and explanation of THRR including how the numbers were predicted and where they are located for reference       Expected Outcomes Short Term: Able to state/look up THRR;Long Term: Able to use THRR to govern intensity when exercising independently;Short Term: Able to use daily as guideline for intensity in rehab       Able to check pulse independently Yes       Intervention Provide education and demonstration on how to check pulse in carotid and radial arteries.;Review the importance of being able to check your own pulse for safety during independent exercise       Expected Outcomes Short Term:  Able to explain why pulse checking is important during independent exercise;Long Term: Able to check pulse independently and accurately       Understanding of Exercise Prescription Yes       Intervention Provide education, explanation, and written materials on patient's individual exercise prescription       Expected Outcomes Short Term: Able to explain program exercise prescription;Long Term: Able to explain home exercise prescription to exercise independently                Exercise Goals Re-Evaluation :   Discharge Exercise  Prescription (Final Exercise Prescription Changes):  Exercise Prescription Changes - 10/17/21 1200       Response to Exercise   Blood Pressure (Admit) 136/74    Blood Pressure (Exercise) 142/70    Blood Pressure (Exit) 130/80    Heart Rate (Admit) 69 bpm    Heart Rate (Exercise) 100 bpm    Heart Rate (Exit) 74 bpm    Oxygen Saturation (Admit) 98 %    Oxygen Saturation (Exercise) 97 %    Oxygen Saturation (Exit) 98 %    Rating of Perceived Exertion (Exercise) 9    Perceived Dyspnea (Exercise) 0    Symptoms foot pain (4/10)    Comments 6 MWT results             Nutrition:  Target Goals: Understanding of nutrition guidelines, daily intake of sodium 1500mg , cholesterol 200mg , calories 30% from fat and 7% or less from saturated fats, daily to have 5 or more servings of fruits and vegetables.  Education: All About Nutrition: -Group instruction provided by verbal, written material, interactive activities, discussions, models, and posters to present general guidelines for heart healthy nutrition including fat, fiber, MyPlate, the role of sodium in heart healthy nutrition, utilization of the nutrition label, and utilization of this knowledge for meal planning. Follow up email sent as well. Written material given at graduation. Flowsheet Row Cardiac Rehab from 10/17/2021 in [facility] Cardiac and Pulmonary Rehab  Education need identified 10/17/21        Biometrics:  Pre Biometrics - 10/17/21 1207       Pre Biometrics   Height  (1.676 m)    Weight 156 lb 6.4 oz (70.9 kg)    BMI (Calculated) 25.26    Single Leg Stand 4.53 seconds              Nutrition Therapy Plan and Nutrition Goals:  Nutrition Therapy & Goals - 10/17/21 1208       Intervention Plan   Intervention Prescribe, educate and counsel regarding individualized specific dietary modifications aiming towards targeted core components such as weight, hypertension, lipid management, diabetes, heart failure and other comorbidities.    Expected Outcomes Short Term Goal: Understand basic principles of dietary content, such as calories, fat, sodium, cholesterol and nutrients.;Short Term Goal: A plan has been developed with personal nutrition goals set during dietitian appointment.;Long Term Goal: Adherence to prescribed nutrition plan.             Nutrition Assessments:  MEDIFICTS Score Key: ?70 Need to make dietary changes  40-70 Heart Healthy Diet ? 40 Therapeutic Level Cholesterol Diet  Flowsheet Row Cardiac Rehab from 10/17/2021 in [facility] Cardiac and Pulmonary Rehab  Picture Your Plate Total Score on Admission 71      Picture Your Plate Scores: <16 Unhealthy dietary pattern with much room for improvement. 41-50 Dietary pattern unlikely to meet recommendations for good health and room for improvement. 51-60 More healthful dietary pattern, with some room for improvement.  >60 Healthy dietary pattern, although there may be some specific behaviors that could be improved.    Nutrition Goals Re-Evaluation:   Nutrition Goals Discharge (Final Nutrition Goals Re-Evaluation):   Psychosocial: Target Goals: Acknowledge presence or absence of significant depression and/or stress, maximize coping skills, provide positive support system. Participant is able to verbalize types and ability to use techniques and skills needed for reducing stress and depression.    Education: Stress, Anxiety, and Depression - Group verbal and visual presentation to define topics covered.  Reviews how body  is impacted by stress, anxiety, and depression.  Also discusses healthy ways to reduce stress and to treat/manage anxiety and depression.  Written material given at graduation.   Education: Sleep Hygiene -Provides group verbal and written instruction about how sleep can affect your health.  Define sleep hygiene, discuss sleep cycles and impact of sleep habits. Review good sleep hygiene tips.    Initial Review & Psychosocial Screening:  Initial Psych Review & Screening - 10/06/21 1319       Initial Review   Current issues with Current Stress Concerns    Source of Stress Concerns Family    Comments Stress with husband needing total care.  Does have helpers every day so she can do what she needs to do.      Family Dynamics   Good Support System? Yes   daughter 2 grandkids     Barriers   Psychosocial barriers to participate in program The patient should benefit from training in stress management and relaxation.      Screening Interventions   Interventions Encouraged to exercise;To provide support and resources with identified psychosocial needs;Provide feedback about the scores to participant    Expected Outcomes Short Term goal: Utilizing psychosocial counselor, staff and physician to assist with identification of specific Stressors or current issues interfering with healing process. Setting desired goal for each stressor or current issue identified.;Long Term Goal: Stressors or current issues are controlled or eliminated.;Short Term goal: Identification and review with participant of any Quality of Life or Depression concerns found by scoring the questionnaire.;Long Term goal: The participant improves quality of Life and PHQ9 Scores as seen by post scores and/or verbalization of changes             Quality of Life Scores:   Quality of Life - 10/17/21 1209        Quality of Life   Select Quality of Life      Quality of Life Scores   Health/Function Pre 11.36 %    Socioeconomic Pre 13.13 %    Psych/Spiritual Pre 10.92 %    Family Pre 20.63 %    GLOBAL Pre 12.84 %            Scores of 19 and below usually indicate a poorer quality of life in these areas.  A difference of  2-3 points is a clinically meaningful difference.  A difference of 2-3 points in the total score of the Quality of Life Index has been associated with significant improvement in overall quality of life, self-image, physical symptoms, and general health in studies assessing change in quality of life.  PHQ-9: Review Flowsheet       10/17/2021 09/08/2021  Depression screen PHQ 2/9  Decreased Interest 2 1  Down, Depressed, Hopeless 2 2  PHQ - 2 Score 4 3  Altered sleeping 3 2  Tired, decreased energy 3 1  Change in appetite 1 1  Feeling bad or failure about yourself  1 1  Trouble concentrating 1 0  Moving slowly or fidgety/restless 0 0  Suicidal thoughts 1 0  PHQ-9 Score 14 8  Difficult doing work/chores Somewhat difficult Not difficult at all   Interpretation of Total Score  Total Score Depression Severity:  1-4 = Minimal depression, 5-9 = Mild depression, 10-14 = Moderate depression, 15-19 = Moderately severe depression, 20-27 = Severe depression   Psychosocial Evaluation and Intervention:  Psychosocial Evaluation - 10/06/21 1326       Psychosocial Evaluation & Interventions   Interventions Encouraged  to exercise with the program and follow exercise prescription    Comments Annice Pih has no barriers to attending the program. She does have stress at home in that her husband requires fulltime care. They do have help for his care so she can have time to take care of herself. She has a daughter and two grandkids that live in [place] as her support. She is ready to get started with the program in hope that she will gain more strentgh and stamina to be able not to  tire out every afternoon.    Expected Outcomes STG Annice Pih will attend all scheduled sessions, she will have time to work on her health and stamina. LTG Annice Pih will be able to continue to care for herself maintaining the program she started here at her home    Continue Psychosocial Services  Follow up required by staff             Psychosocial Re-Evaluation:   Psychosocial Discharge (Final Psychosocial Re-Evaluation):   Vocational Rehabilitation: Provide vocational rehab assistance to qualifying candidates.   Vocational Rehab Evaluation & Intervention:   Education: Education Goals: Education classes will be provided on a variety of topics geared toward better understanding of heart health and risk factor modification. Participant will state understanding/return demonstration of topics presented as noted by education test scores.  Learning Barriers/Preferences:   General Cardiac Education Topics:  AED/CPR: - Group verbal and written instruction with the use of models to demonstrate the basic use of the AED with the basic ABC's of resuscitation.   Anatomy and Cardiac Procedures: - Group verbal and visual presentation and models provide information about basic cardiac anatomy and function. Reviews the testing methods done to diagnose heart disease and the outcomes of the test results. Describes the treatment choices: Medical Management, Angioplasty, or Coronary Bypass Surgery for treating various heart conditions including Myocardial Infarction, Angina, Valve Disease, and Cardiac Arrhythmias.  Written material given at graduation.   Medication Safety: - Group verbal and visual instruction to review commonly prescribed medications for heart and lung disease. Reviews the medication, class of the drug, and side effects. Includes the steps to properly store meds and maintain the prescription regimen.  Written material given at graduation.   Intimacy: - Group verbal instruction  through game format to discuss how heart and lung disease can affect sexual intimacy. Written material given at graduation..   Know Your Numbers and Heart Failure: - Group verbal and visual instruction to discuss disease risk factors for cardiac and pulmonary disease and treatment options.  Reviews associated critical values for Overweight/Obesity, Hypertension, Cholesterol, and Diabetes.  Discusses basics of heart failure: signs/symptoms and treatments.  Introduces Heart Failure Zone chart for action plan for heart failure.  Written material given at graduation.   Infection Prevention: - Provides verbal and written material to individual with discussion of infection control including proper hand washing and proper equipment cleaning during exercise session. Flowsheet Row Cardiac Rehab from 10/17/2021 in [facility] Cardiac and Pulmonary Rehab  Date 10/17/21  Educator [facility]  Instruction Review Code 1- Verbalizes Understanding       Falls Prevention: - Provides verbal and written material to individual with discussion of falls prevention and safety. Flowsheet Row Cardiac Rehab from 10/17/2021 in [facility] Cardiac and Pulmonary Rehab  Date 10/17/21  Educator [facility]  Instruction Review Code 1- Verbalizes Understanding       Other: -Provides group and verbal instruction on various topics (see comments)   Knowledge Questionnaire Score:  Knowledge Questionnaire Score -  10/17/21 1210       Knowledge Questionnaire Score   Pre Score 24/26             Core Components/Risk Factors/Patient Goals at Admission:  Personal Goals and Risk Factors at Admission - 10/17/21 1208       Core Components/Risk Factors/Patient Goals on Admission    Weight Management Yes    Intervention Weight Management: Develop a combined nutrition and exercise program designed to reach desired caloric intake, while maintaining appropriate intake of nutrient and fiber, sodium and fats, and appropriate energy expenditure required  for the weight goal.;Weight Management: Provide education and appropriate resources to help participant work on and attain dietary goals.    Admit Weight 156 lb 6.4 oz (70.9 kg)    Goal Weight: Short Term 150 lb (68 kg)    Goal Weight: Long Term 150 lb (68 kg)    Expected Outcomes Short Term: Continue to assess and modify interventions until short term weight is achieved;Long Term: Adherence to nutrition and physical activity/exercise program aimed toward attainment of established weight goal;Weight Maintenance: Understanding of the daily nutrition guidelines, which includes 25-35% calories from fat, 7% or less cal from saturated fats, less than 200mg  cholesterol, less than 1.5gm of sodium, & 5 or more servings of fruits and vegetables daily;Understanding recommendations for meals to include 15-35% energy as protein, 25-35% energy from fat, 35-60% energy from carbohydrates, less than 200mg  of dietary cholesterol, 20-35 gm of total fiber daily;Understanding of distribution of calorie intake throughout the day with the consumption of 4-5 meals/snacks    Heart Failure Yes    Intervention Provide a combined exercise and nutrition program that is supplemented with education, support and counseling about heart failure. Directed toward relieving symptoms such as shortness of breath, decreased exercise tolerance, and extremity edema.    Expected Outcomes Improve functional capacity of life;Short term: Attendance in program 2-3 days a week with increased exercise capacity. Reported lower sodium intake. Reported increased fruit and vegetable intake. Reports medication compliance.;Short term: Daily weights obtained and reported for increase. Utilizing diuretic protocols set by physician.;Long term: Adoption of self-care skills and reduction of barriers for early signs and symptoms recognition and intervention leading to self-care maintenance.    Lipids Yes    Intervention Provide education and support for participant  on nutrition & aerobic/resistive exercise along with prescribed medications to achieve LDL 70mg , HDL >40mg .    Expected Outcomes Short Term: Participant states understanding of desired cholesterol values and is compliant with medications prescribed. Participant is following exercise prescription and nutrition guidelines.;Long Term: Cholesterol controlled with medications as prescribed, with individualized exercise RX and with personalized nutrition plan. Value goals: LDL < 70mg , HDL > 40 mg.             Education:Diabetes - Individual verbal and written instruction to review signs/symptoms of diabetes, desired ranges of glucose level fasting, after meals and with exercise. Acknowledge that pre and post exercise glucose checks will be done for 3 sessions at entry of program.   Core Components/Risk Factors/Patient Goals Review:    Core Components/Risk Factors/Patient Goals at Discharge (Final Review):    ITP Comments:  ITP Comments     Row Name 10/06/21 1331 10/17/21 1159         ITP Comments Virtual orientation call completed today. shehas an appointment on Date: 10/17/2021  for EP eval and gym Orientation.  Documentation of diagnosis can be found in [facility] Date: 07/17/2021 . Completed <MEASUREME<MEASUREMENT T> and gym orientation. Initial ITP  created and sent for review to Dr. Bethann Punches, Medical Director.               Comments: initial ITP

## 2021-10-19 ENCOUNTER — Ambulatory Visit (INDEPENDENT_AMBULATORY_CARE_PROVIDER_SITE_OTHER): Payer: Medicare Other | Admitting: *Deleted

## 2021-10-19 DIAGNOSIS — Z0000 Encounter for general adult medical examination without abnormal findings: Secondary | ICD-10-CM

## 2021-10-19 DIAGNOSIS — Z1231 Encounter for screening mammogram for malignant neoplasm of breast: Secondary | ICD-10-CM | POA: Diagnosis not present

## 2021-10-19 NOTE — Patient Instructions (Signed)
Veronica Romero , Thank you for taking time to come for your Medicare Wellness Visit. I appreciate your ongoing commitment to your health goals. Please review the following plan we discussed and let me know if I can assist you in the future.   Screening recommendations/referrals: Colonoscopy: Cologuard  Mammogram: up to date Bone Density: up to date Recommended yearly ophthalmology/optometry visit for glaucoma screening and checkup Recommended yearly dental visit for hygiene and checkup  Vaccinations: Influenza vaccine: up to date Pneumococcal vaccine: up to date Tdap vaccine: Education provided Shingles vaccine: Education provided      Next appointment: 12-13-2021 @ 10:00  [facility] 65 Years and Older, Female Preventive care refers to lifestyle choices and visits with your health care provider that can promote health and wellness. What does preventive care include? A yearly physical exam. This is also called an annual well check. Dental exams once or twice a year. Routine eye exams. Ask your health care provider how often you should have your eyes checked. Personal lifestyle choices, including: Daily care of your teeth and gums. Regular physical activity. Eating a healthy diet. Avoiding tobacco and drug use. Limiting alcohol use. Practicing safe sex. Taking low-dose aspirin every day. Taking vitamin and mineral supplements as recommended by your health care provider. What happens during an annual well check? The services and screenings done by your health care provider during your annual well check will depend on your age, overall health, lifestyle risk factors, and family history of disease. Counseling  Your health care provider may ask you questions about your: Alcohol use. Tobacco use. Drug use. Emotional well-being. Home and relationship well-being. Sexual activity. Eating habits. History of falls. Memory and ability to understand (cognition). Work  and work Astronomer. Reproductive health. Screening  You may have the following tests or measurements: Height, weight, and BMI. Blood pressure. Lipid and cholesterol levels. These may be checked every 5 years, or more frequently if you are over 4 years old. Skin check. Lung cancer screening. You may have this screening every year starting at age 40 if you have a 30-pack-year history of smoking and currently smoke or have quit within the past 15 years. Fecal occult blood test (FOBT) of the stool. You may have this test every year starting at age 12. Flexible sigmoidoscopy or colonoscopy. You may have a sigmoidoscopy every 5 years or a colonoscopy every 10 years starting at age 92. Hepatitis C blood test. Hepatitis B blood test. Sexually transmitted disease (STD) testing. Diabetes screening. This is done by checking your blood sugar (glucose) after you have not eaten for a while (fasting). You may have this done every 1-3 years. Bone density scan. This is done to screen for osteoporosis. You may have this done starting at age 36. Mammogram. This may be done every 1-2 years. Talk to your health care provider about how often you should have regular mammograms. Talk with your health care provider about your test results, treatment options, and if necessary, the need for more tests. Vaccines  Your health care provider may recommend certain vaccines, such as: Influenza vaccine. This is recommended every year. Tetanus, diphtheria, and acellular pertussis (Tdap, Td) vaccine. You may need a Td booster every 10 years. Zoster vaccine. You may need this after age 23. Pneumococcal 13-valent conjugate (PCV13) vaccine. One dose is recommended after age 48. Pneumococcal polysaccharide (PPSV23) vaccine. One dose is recommended after age 42. Talk to your health care provider about which screenings and vaccines you need and  how often you need them. This information is not intended to replace advice given to  you by your health care provider. Make sure you discuss any questions you have with your health care provider. Document Released: 05/13/2015 Document Revised: 01/04/2016 Document Reviewed: 02/15/2015 Elsevier Interactive Patient Education  2017 [place] Prevention in the Home Falls can cause injuries. They can happen to people of all ages. There are many things you can do to make your home safe and to help prevent falls. What can I do on the outside of my home? Regularly fix the edges of walkways and driveways and fix any cracks. Remove anything that might make you trip as you walk through a door, such as a raised step or threshold. Trim any bushes or trees on the path to your home. Use bright outdoor lighting. Clear any walking paths of anything that might make someone trip, such as rocks or tools. Regularly check to see if handrails are loose or broken. Make sure that both sides of any steps have handrails. Any raised decks and porches should have guardrails on the edges. Have any leaves, snow, or ice cleared regularly. Use sand or salt on walking paths during winter. Clean up any spills in your garage right away. This includes oil or grease spills. What can I do in the bathroom? Use night lights. Install grab bars by the toilet and in the tub and shower. Do not use towel bars as grab bars. Use non-skid mats or decals in the tub or shower. If you need to sit down in the shower, use a plastic, non-slip stool. Keep the floor dry. Clean up any water that spills on the floor as soon as it happens. Remove soap buildup in the tub or shower regularly. Attach bath mats securely with double-sided non-slip rug tape. Do not have throw rugs and other things on the floor that can make you trip. What can I do in the bedroom? Use night lights. Make sure that you have a light by your bed that is easy to reach. Do not use any sheets or blankets that are too big for your bed. They should not  hang down onto the floor. Have a firm chair that has side arms. You can use this for support while you get dressed. Do not have throw rugs and other things on the floor that can make you trip. What can I do in the kitchen? Clean up any spills right away. Avoid walking on wet floors. Keep items that you use a lot in easy-to-reach places. If you need to reach something above you, use a strong step stool that has a grab bar. Keep electrical cords out of the way. Do not use floor polish or wax that makes floors slippery. If you must use wax, use non-skid floor wax. Do not have throw rugs and other things on the floor that can make you trip. What can I do with my stairs? Do not leave any items on the stairs. Make sure that there are handrails on both sides of the stairs and use them. Fix handrails that are broken or loose. Make sure that handrails are as long as the stairways. Check any carpeting to make sure that it is firmly attached to the stairs. Fix any carpet that is loose or worn. Avoid having throw rugs at the top or bottom of the stairs. If you do have throw rugs, attach them to the floor with carpet tape. Make sure that you have  a light switch at the top of the stairs and the bottom of the stairs. If you do not have them, ask someone to add them for you. What else can I do to help prevent falls? Wear shoes that: Do not have high heels. Have rubber bottoms. Are comfortable and fit you well. Are closed at the toe. Do not wear sandals. If you use a stepladder: Make sure that it is fully opened. Do not climb a closed stepladder. Make sure that both sides of the stepladder are locked into place. Ask someone to hold it for you, if possible. Clearly mark and make sure that you can see: Any grab bars or handrails. First and last steps. Where the edge of each step is. Use tools that help you move around (mobility aids) if they are needed. These  include: Canes. Walkers. Scooters. Crutches. Turn on the lights when you go into a dark area. Replace any light bulbs as soon as they burn out. Set up your furniture so you have a clear path. Avoid moving your furniture around. If any of your floors are uneven, fix them. If there are any pets around you, be aware of where they are. Review your medicines with your doctor. Some medicines can make you feel dizzy. This can increase your chance of falling. Ask your doctor what other things that you can do to help prevent falls. This information is not intended to replace advice given to you by your health care provider. Make sure you discuss any questions you have with your health care provider. Document Released: 02/10/2009 Document Revised: 09/22/2015 Document Reviewed: 05/21/2014 Elsevier Interactive Patient Education  2017 [employer].

## 2021-10-24 DIAGNOSIS — Z955 Presence of coronary angioplasty implant and graft: Secondary | ICD-10-CM | POA: Diagnosis not present

## 2021-10-24 DIAGNOSIS — I214 Non-ST elevation (NSTEMI) myocardial infarction: Secondary | ICD-10-CM

## 2021-10-24 NOTE — Progress Notes (Signed)
Daily Session Note  Patient Details  Name: Veronica Romero MRN: [id number] Date of Birth: [birth year] Referring Provider:   Flowsheet Row Cardiac Rehab from 10/17/2021 in [facility] Cardiac and Pulmonary Rehab  Referring Provider Thomasene Ripple       Encounter Date: 10/24/2021  Check In:  Session Check In - 10/24/21 0955       Check-In   Supervising physician immediately available to respond to emergencies See telemetry face sheet for immediately available ER MD    Location [facility]-Cardiac & Pulmonary Rehab    Staff Present Tommye Standard, BS, ACSM CEP, Exercise Physiologist;Endrit Gittins Myles Gip, MPA, RN;Jessica Juanetta Gosling, MA, RCEP, CCRP, CCET    Virtual Visit No    Medication changes reported     No    Fall or balance concerns reported    No    Warm-up and Cool-down Performed on first and last piece of equipment    Resistance Training Performed Yes    VAD Patient? No    PAD/SET Patient? No      Pain Assessment   Currently in Pain? No/denies                Social History   Tobacco Use  Smoking Status Never  Smokeless Tobacco Never    Goals Met:  Independence with exercise equipment Exercise tolerated well No report of concerns or symptoms today Strength training completed today  Goals Unmet:  Not Applicable  Comments: First full day of exercise!  Patient was oriented to gym and equipment including functions, settings, policies, and procedures.  Patient's individual exercise prescription and treatment plan were reviewed.  All starting workloads were established based on the results of the 6 minute walk test done at initial orientation visit.  The plan for exercise progression was also introduced and progression will be customized based on patient's performance and goals.    Dr. Bethann Punches is Medical Director for [facility] Cardiac Rehabilitation.  Dr. Vida Rigger is Medical Director for [facility] Pulmonary Rehabilitation.

## 2021-10-26 DIAGNOSIS — M059 Rheumatoid arthritis with rheumatoid factor, unspecified: Principal | ICD-10-CM

## 2021-10-26 DIAGNOSIS — M25461 Effusion, right knee: Principal | ICD-10-CM

## 2021-10-26 DIAGNOSIS — M25561 Pain in right knee: Principal | ICD-10-CM

## 2021-10-26 DIAGNOSIS — M05741 Rheumatoid arthritis with rheumatoid factor of right hand without organ or systems involvement: Principal | ICD-10-CM

## 2021-10-26 DIAGNOSIS — M05742 Rheumatoid arthritis with rheumatoid factor of left hand without organ or systems involvement: Principal | ICD-10-CM

## 2021-10-26 DIAGNOSIS — M0579 Rheumatoid arthritis with rheumatoid factor of multiple sites without organ or systems involvement: Principal | ICD-10-CM

## 2021-10-26 DIAGNOSIS — I214 Non-ST elevation (NSTEMI) myocardial infarction: Secondary | ICD-10-CM

## 2021-10-26 DIAGNOSIS — Z955 Presence of coronary angioplasty implant and graft: Secondary | ICD-10-CM | POA: Diagnosis not present

## 2021-10-26 MED ORDER — TRAMADOL 50 MG TABLET
ORAL_TABLET | 0 refills | 0 days
Start: 2021-10-26 — End: ?

## 2021-10-26 NOTE — Progress Notes (Signed)
Daily Session Note  Patient Details  Name: Veronica Romero MRN: [id number] Date of Birth: Feb 28, 1951 Referring Provider:   Flowsheet Row Cardiac Rehab from 10/17/2021 in [facility] Cardiac and Pulmonary Rehab  Referring Provider Berniece Salines       Encounter Date: 10/26/2021  Check In:  Session Check In - 10/26/21 1540       Check-In   Supervising physician immediately available to respond to emergencies See telemetry face sheet for immediately available ER MD    Location [facility]-Cardiac & Pulmonary Rehab    Staff Present Heath Lark, RN, BSN, CCRP;Joseph [place], RCP,RRT,BSRT;Jessica [place], [place], RCEP, CCRP, [place], [place], Exercise Physiologist    Virtual Visit No    Medication changes reported     No    Fall or balance concerns reported    No    Warm-up and Cool-down Performed on first and last piece of equipment    Resistance Training Performed Yes    VAD Patient? No    PAD/SET Patient? No      Pain Assessment   Currently in Pain? No/denies                Social History   Tobacco Use  Smoking Status Never  Smokeless Tobacco Never    Goals Met:  Independence with exercise equipment Exercise tolerated well No report of concerns or symptoms today  Goals Unmet:  Not Applicable  Comments: Pt able to follow exercise prescription today without complaint.  Will continue to monitor for progression.    Dr. Emily Filbert is Medical Director for [place].  Dr. Ottie Glazier is Medical Director for [facility] Pulmonary Rehabilitation.

## 2021-10-27 MED ORDER — TRAMADOL 50 MG TABLET
ORAL_TABLET | 0 refills | 0 days | Status: CP
Start: 2021-10-27 — End: ?

## 2021-11-02 ENCOUNTER — Encounter: Payer: Medicare Other | Attending: Cardiology | Admitting: *Deleted

## 2021-11-02 DIAGNOSIS — Z955 Presence of coronary angioplasty implant and graft: Secondary | ICD-10-CM | POA: Diagnosis not present

## 2021-11-02 DIAGNOSIS — Z48812 Encounter for surgical aftercare following surgery on the circulatory system: Secondary | ICD-10-CM | POA: Insufficient documentation

## 2021-11-02 DIAGNOSIS — I252 Old myocardial infarction: Secondary | ICD-10-CM | POA: Insufficient documentation

## 2021-11-02 DIAGNOSIS — I214 Non-ST elevation (NSTEMI) myocardial infarction: Secondary | ICD-10-CM

## 2021-11-02 NOTE — Progress Notes (Signed)
Daily Session Note  Patient Details  Name: Veronica Romero MRN: [id number] Date of Birth: 09/29/1950 Referring Provider:   Flowsheet Row Cardiac Rehab from 10/17/2021 in [facility] Cardiac and Pulmonary Rehab  Referring Provider Berniece Salines       Encounter Date: 11/02/2021  Check In:  Session Check In - 11/02/21 1540       Check-In   Supervising physician immediately available to respond to emergencies See telemetry face sheet for immediately available ER MD    Location [facility]-Cardiac & Pulmonary Rehab    Staff Present Antionette Fairy, BS, Exercise Physiologist;Joseph [place], RCP,RRT,BSRT;Jessica [place], MA, RCEP, CCRP, CCET;Deigo Alonso, RN, BSN, CCRP    Virtual Visit No    Medication changes reported     No    Fall or balance concerns reported    No    Tobacco Cessation No Change    Warm-up and Cool-down Performed on first and last piece of equipment    Resistance Training Performed Yes    VAD Patient? No    PAD/SET Patient? No      Pain Assessment   Currently in Pain? No/denies    Multiple Pain Sites No                Social History   Tobacco Use  Smoking Status Never  Smokeless Tobacco Never    Goals Met:  Independence with exercise equipment Exercise tolerated well No report of concerns or symptoms today  Goals Unmet:  Not Applicable  Comments: Pt able to follow exercise prescription today without complaint.  Will continue to monitor for progression.    Dr. Emily Filbert is Medical Director for [place].  Dr. Ottie Glazier is Medical Director for [facility] Pulmonary Rehabilitation.

## 2021-11-06 ENCOUNTER — Encounter: Payer: Medicare Other | Admitting: *Deleted

## 2021-11-06 DIAGNOSIS — Z955 Presence of coronary angioplasty implant and graft: Secondary | ICD-10-CM | POA: Diagnosis not present

## 2021-11-06 DIAGNOSIS — I214 Non-ST elevation (NSTEMI) myocardial infarction: Secondary | ICD-10-CM

## 2021-11-06 NOTE — Progress Notes (Signed)
Daily Session Note  Patient Details  Name: Veronica Romero MRN: [id number] Date of Birth: 01/16/1951 Referring Provider:   Flowsheet Row Cardiac Rehab from 10/17/2021 in [facility] Cardiac and Pulmonary Rehab  Referring Provider Kardie Tobb       Encounter Date: 11/06/2021  Check In:  Session Check In - 11/06/21 1016       Check-In   Supervising physician immediately available to respond to emergencies See telemetry face sheet for immediately available ER MD    Location [facility]-Cardiac & Pulmonary Rehab    Staff Present  , RN, BSN, CCRP;Joseph [place], RCP,RRT,BSRT;Kelly Hayes, BS, ACSM CEP, Exercise Physiologist    Virtual Visit No    Medication changes reported     No    Fall or balance concerns reported    No    Warm-up and Cool-down Performed on first and last piece of equipment    Resistance Training Performed Yes    VAD Patient? No    PAD/SET Patient? No      Pain Assessment   Currently in Pain? No/denies                Social History   Tobacco Use  Smoking Status Never  Smokeless Tobacco Never    Goals Met:  Independence with exercise equipment Exercise tolerated well No report of concerns or symptoms today  Goals Unmet:  Not Applicable  Comments: Pt able to follow exercise prescription today without complaint.  Will continue to monitor for progression.    Dr. Mark Beitzel is Medical Director for [facility] Cardiac Rehabilitation.  Dr. Fuad Aleskerov is Medical Director for [facility] Pulmonary Rehabilitation. 

## 2021-11-07 ENCOUNTER — Ambulatory Visit (HOSPITAL_COMMUNITY): Payer: Medicare Other | Attending: Cardiovascular Disease

## 2021-11-07 DIAGNOSIS — I255 Ischemic cardiomyopathy: Secondary | ICD-10-CM | POA: Diagnosis present

## 2021-11-07 LAB — ECHOCARDIOGRAM COMPLETE
Area-P 1/2: 3.85 cm2
P 1/2 time: 293 msec
S' Lateral: 3.8 cm

## 2021-11-07 MED ORDER — PERFLUTREN LIPID MICROSPHERE
1.0000 mL | INTRAVENOUS | Status: AC | PRN
  Administered 2021-11-07: 1 mL via INTRAVENOUS

## 2021-11-08 ENCOUNTER — Encounter: Payer: Self-pay | Admitting: *Deleted

## 2021-11-08 DIAGNOSIS — I214 Non-ST elevation (NSTEMI) myocardial infarction: Secondary | ICD-10-CM

## 2021-11-08 DIAGNOSIS — Z955 Presence of coronary angioplasty implant and graft: Secondary | ICD-10-CM

## 2021-11-08 NOTE — Progress Notes (Signed)
Cardiac Individual Treatment Plan  Patient Details  Name: Veronica Romero MRN: [id number] Date of Birth: [birth year] Referring Provider:   Flowsheet Row Cardiac Rehab from 10/17/2021 in [facility] Cardiac and Pulmonary Rehab  Referring Provider Berniece Salines       Initial Encounter Date:  Flowsheet Row Cardiac Rehab from 10/17/2021 in [facility] Cardiac and Pulmonary Rehab  Date 10/17/21       Visit Diagnosis: NSTEMI (non-ST elevation myocardial infarction) [facility])  Status post coronary artery stent placement  Patient's Home Medications on Admission:  Current Outpatient Medications:    Adalimumab (HUMIRA PEN) 40 MG/0.4ML PNKT, Inject 0.4 mLs into the skin every Tuesday. Tuesday, Disp: , Rfl:    aspirin 81 MG EC tablet, Take 1 tablet (81 mg total) by mouth daily. Swallow whole., Disp: 90 tablet, Rfl: 1   carvedilol (COREG) 3.125 MG tablet, Take 1 tablet (3.125 mg total) by mouth 2 (two) times daily with a meal., Disp: 180 tablet, Rfl: 2   Cholecalciferol (VITAMIN D3 PO), Take 1 tablet by mouth daily., Disp: , Rfl:    clopidogrel (PLAVIX) 75 MG tablet, Take 1 tablet (75 mg total) by mouth daily., Disp: 90 tablet, Rfl: 2   empagliflozin (JARDIANCE) 10 MG TABS tablet, Take 1 tablet (10 mg total) by mouth daily before breakfast. (Patient not taking: Reported on 10/06/2021), Disp: 90 tablet, Rfl: 3   furosemide (LASIX) 40 MG tablet, Take 1 tablet (40 mg total) by mouth every other day., Disp: 45 tablet, Rfl: 3   leucovorin (WELLCOVORIN) 5 MG tablet, Take 15 mg by mouth every Saturday., Disp: , Rfl:    losartan (COZAAR) 25 MG tablet, Take 1/2 tablet (12.5 mg total) by mouth daily., Disp: 30 tablet, Rfl: 2   methotrexate 50 MG/2ML injection, Inject 1 mL (25 mg total) into the muscle every Saturday. Hold methotrexate for 2 more weeks and then resume 3/10, Disp: , Rfl:    Multiple Vitamin (MULTIVITAMIN WITH MINERALS) TABS tablet, Take 1 tablet by mouth daily., Disp: , Rfl:    nitroGLYCERIN (NITROSTAT) 0.4 MG  SL tablet, Place 1 tablet (0.4 mg total) under the tongue every 5 (five) minutes as needed., Disp: 25 tablet, Rfl: 2   Polyethyl Glycol-Propyl Glycol (LUBRICANT EYE DROPS) 0.4-0.3 % SOLN, Place 1 drop into both eyes 3 (three) times daily as needed (dry/irritated eyes.)., Disp: , Rfl:    potassium chloride SA (KLOR-CON M) 20 MEQ tablet, Take 1 tablet (20 mEq total) by mouth daily., Disp: 30 tablet, Rfl: 1   predniSONE (DELTASONE) 5 MG tablet, Take 10 mg by mouth in the morning., Disp: , Rfl:    rosuvastatin (CRESTOR) 40 MG tablet, Take 1 tablet (40 mg total) by mouth daily., Disp: 90 tablet, Rfl: 0   spironolactone (ALDACTONE) 25 MG tablet, Take 1/2 tablet (12.5 mg total) by mouth daily., Disp: 30 tablet, Rfl: 2   traMADol (ULTRAM) 50 MG tablet, Take 50 mg by mouth every 6 (six) hours as needed (pain.)., Disp: , Rfl:   Past Medical History: Past Medical History:  Diagnosis Date   Ischemic cardiomyopathy 06/12/2021   EF 25-30%   Rheumatoid arthritis ([place])     Tobacco Use: Social History   Tobacco Use  Smoking Status Never  Smokeless Tobacco Never    Labs: Review Flowsheet       Latest Ref Rng & Units 06/12/2021 06/13/2021 06/16/2021  Labs for ITP Cardiac and Pulmonary Rehab  Cholestrol 0 - 200 mg/dL - - 177   LDL (calc) 0 - 99  mg/dL - - 116   HDL-C >40 mg/dL - - 38   Trlycerides <150 mg/dL - 66  115   Hemoglobin A1c 4.8 - 5.6 % 5.4  - -  PH, Arterial 7.35 - 7.45 7.451  7.155  7.359  7.471  7.518  7.507  7.513   PCO2 arterial 32 - 48 mmHg 39.0  72.5  41.8  29.8  37.0  38.0  39.7   Bicarbonate 20.0 - 28.0 mmol/L 26.6  24.7  23.7  22.0  30.1  30.1  31.9   TCO2 22 - 32 mmol/L $RemoveB'28  27  25  23  31  31  'MIKedntX$ 33   Acid-base deficit 0.0 - 2.0 mmol/L 4.0  2.0  1.0  -  O2 Saturation % 100.0  94.0  97.0  96.0  94  70  68      Exercise Target Goals: Exercise Program Goal: Individual exercise prescription set using results from initial 6 min walk test and THRR while considering  patient's  activity barriers and safety.   Exercise Prescription Goal: Initial exercise prescription builds to 30-45 minutes a day of aerobic activity, 2-3 days per week.  Home exercise guidelines will be given to patient during program as part of exercise prescription that the participant will acknowledge.   Education: Aerobic Exercise: - Group verbal and visual presentation on the components of exercise prescription. Introduces F.I.T.T principle from ACSM for exercise prescriptions.  Reviews F.I.T.T. principles of aerobic exercise including progression. Written material given at graduation. Flowsheet Row Cardiac Rehab from 10/17/2021 in [facility] Cardiac and Pulmonary Rehab  Education need identified 10/17/21       Education: Resistance Exercise: - Group verbal and visual presentation on the components of exercise prescription. Introduces F.I.T.T principle from ACSM for exercise prescriptions  Reviews F.I.T.T. principles of resistance exercise including progression. Written material given at graduation.    Education: Exercise & Equipment Safety: - Individual verbal instruction and demonstration of equipment use and safety with use of the equipment. Flowsheet Row Cardiac Rehab from 10/17/2021 in [facility] Cardiac and Pulmonary Rehab  Date 10/17/21  Educator [facility]  Instruction Review Code 1- Verbalizes Understanding       Education: Exercise Physiology & General Exercise Guidelines: - Group verbal and written instruction with models to review the exercise physiology of the cardiovascular system and associated critical values. Provides general exercise guidelines with specific guidelines to those with heart or lung disease.    Education: Flexibility, Balance, Mind/Body Relaxation: - Group verbal and visual presentation with interactive activity on the components of exercise prescription. Introduces F.I.T.T principle from ACSM for exercise prescriptions. Reviews F.I.T.T. principles of flexibility and balance  exercise training including progression. Also discusses the mind body connection.  Reviews various relaxation techniques to help reduce and manage stress (i.e. Deep breathing, progressive muscle relaxation, and visualization). Balance handout provided to take home. Written material given at graduation.   Activity Barriers & Risk Stratification:  Activity Barriers & Cardiac Risk Stratification - 10/06/21 1316       Activity Barriers & Cardiac Risk Stratification   Activity Barriers Arthritis    Cardiac Risk Stratification Moderate             6 Minute Walk:  6 Minute Walk     Row Name 10/17/21 1200         6 Minute Walk   Phase Initial     Distance 850 feet     Walk Time 6 minutes     # of  Rest Breaks 0     MPH 1.6     METS 2.19     RPE 9     Perceived Dyspnea  0     VO2 Peak 7.68     Symptoms Yes (comment)     Comments arthritic foot pain 4/10     Resting HR 69 bpm     Resting BP 136/74     Resting Oxygen Saturation  98 %     Exercise Oxygen Saturation  during 6 min walk 97 %     Max Ex. HR 100 bpm     Max Ex. BP 142/70     2 Minute Post BP 130/80              Oxygen Initial Assessment:   Oxygen Re-Evaluation:   Oxygen Discharge (Final Oxygen Re-Evaluation):   Initial Exercise Prescription:  Initial Exercise Prescription - 10/17/21 1200       Date of Initial Exercise RX and Referring Provider   Date 10/17/21    Referring Provider Lavona [place] Tobb      Oxygen   Maintain Oxygen Saturation 88% or higher      Recumbant Bike   Level 1    RPM 50    Minutes 15    METs 2.19      NuStep   Level 1    SPM 80    Minutes 15    METs 2.19      REL-XR   Level 1    Speed 50    Minutes 15    METs 2.19      Track   Laps 22    Minutes 15    METs 2.2      Prescription Details   Frequency (times per week) 2    Duration Progress to 30 minutes of continuous aerobic without signs/symptoms of physical distress      Intensity   THRR 40-80% of Max  Heartrate 100-132    Ratings of Perceived Exertion 11-13    Perceived Dyspnea 0-4      Progression   Progression Continue to progress workloads to maintain intensity without signs/symptoms of physical distress.      Resistance Training   Training Prescription Yes    Weight 2    Reps 10-15             Perform Capillary Blood Glucose checks as needed.  Exercise Prescription Changes:   Exercise Prescription Changes     Row Name 10/17/21 1200 11/07/21 1500           Response to Exercise   Blood Pressure (Admit) 136/74 120/62      Blood Pressure (Exercise) 142/70 122/82      Blood Pressure (Exit) 130/80 118/58      Heart Rate (Admit) 69 bpm 70 bpm      Heart Rate (Exercise) 100 bpm 118 bpm      Heart Rate (Exit) 74 bpm 89 bpm      Oxygen Saturation (Admit) 98 % --      Oxygen Saturation (Exercise) 97 % --      Oxygen Saturation (Exit) 98 % --      Rating of Perceived Exertion (Exercise) 9 12      Perceived Dyspnea (Exercise) 0 --      Symptoms foot pain (4/10) foot pain      Comments 6 MWT results --      Duration -- Progress to 30 minutes of  aerobic without signs/symptoms of  physical distress      Intensity -- THRR unchanged        Progression   Progression -- Continue to progress workloads to maintain intensity without signs/symptoms of physical distress.      Average METs -- 2.06        Resistance Training   Training Prescription -- Yes      Weight -- 3 lb      Reps -- 10-15        Interval Training   Interval Training -- No        NuStep   Level -- 4      Minutes -- 15      METs -- 2.3        REL-XR   Level -- 1      Minutes -- 15        Track   Laps -- 15      Minutes -- 15      METs -- 1.82        Oxygen   Maintain Oxygen Saturation -- 88% or higher               Exercise Comments:   Exercise Comments     Row Name 10/24/21 0956           Exercise Comments First full day of exercise!  Patient was oriented to gym and equipment  including functions, settings, policies, and procedures.  Patient's individual exercise prescription and treatment plan were reviewed.  All starting workloads were established based on the results of the 6 minute walk test done at initial orientation visit.  The plan for exercise progression was also introduced and progression will be customized based on patient's performance and goals.                Exercise Goals and Review:   Exercise Goals     Row Name 10/17/21 1206             Exercise Goals   Increase Physical Activity Yes       Intervention Provide advice, education, support and counseling about physical activity/exercise needs.;Develop an individualized exercise prescription for aerobic and resistive training based on initial evaluation findings, risk stratification, comorbidities and participant's personal goals.       Expected Outcomes Short Term: Attend rehab on a regular basis to increase amount of physical activity.;Long Term: Add in home exercise to make exercise part of routine and to increase amount of physical activity.;Long Term: Exercising regularly at least 3-5 days a week.       Increase Strength and Stamina Yes       Intervention Provide advice, education, support and counseling about physical activity/exercise needs.;Develop an individualized exercise prescription for aerobic and resistive training based on initial evaluation findings, risk stratification, comorbidities and participant's personal goals.       Expected Outcomes Short Term: Increase workloads from initial exercise prescription for resistance, speed, and METs.;Short Term: Perform resistance training exercises routinely during rehab and add in resistance training at home;Long Term: Improve cardiorespiratory fitness, muscular endurance and strength as measured by increased METs and functional capacity ( )       Able to understand and use rate of perceived exertion (RPE) scale Yes       Intervention  Provide education and explanation on how to use RPE scale       Expected Outcomes Short Term: Able to use RPE daily in rehab to express subjective intensity level;Long Term:  Able to  use RPE to guide intensity level when exercising independently       Able to understand and use Dyspnea scale Yes       Intervention Provide education and explanation on how to use Dyspnea scale       Expected Outcomes Short Term: Able to use Dyspnea scale daily in rehab to express subjective sense of shortness of breath during exertion;Long Term: Able to use Dyspnea scale to guide intensity level when exercising independently       Knowledge and understanding of Target Heart Rate Range (THRR) Yes       Intervention Provide education and explanation of THRR including how the numbers were predicted and where they are located for reference       Expected Outcomes Short Term: Able to state/look up THRR;Long Term: Able to use THRR to govern intensity when exercising independently;Short Term: Able to use daily as guideline for intensity in rehab       Able to check pulse independently Yes       Intervention Provide education and demonstration on how to check pulse in carotid and radial arteries.;Review the importance of being able to check your own pulse for safety during independent exercise       Expected Outcomes Short Term: Able to explain why pulse checking is important during independent exercise;Long Term: Able to check pulse independently and accurately       Understanding of Exercise Prescription Yes       Intervention Provide education, explanation, and written materials on patient's individual exercise prescription       Expected Outcomes Short Term: Able to explain program exercise prescription;Long Term: Able to explain home exercise prescription to exercise independently                Exercise Goals Re-Evaluation :  Exercise Goals Re-Evaluation     Row Name 10/24/21 0956 11/07/21 1520            Exercise Goal Re-Evaluation   Exercise Goals Review Increase Physical Activity;Able to understand and use rate of perceived exertion (RPE) scale;Knowledge and understanding of Target Heart Rate Range (THRR);Understanding of Exercise Prescription;Able to understand and use Dyspnea scale;Able to check pulse independently;Increase Strength and Stamina Increase Physical Activity;Increase Strength and Stamina;Understanding of Exercise Prescription      Comments Reviewed RPE and dyspnea scales, THR and program prescription with pt today.  Pt voiced understanding and was given a copy of goals to take home. Deshea is off to a good start in rehab.  She has found shoes that she is tolerating, but walking still hurts her feet.  She is trying to do one walking station and one seated each day.  She has surprised herself at how well she is doing.  She is already going on the seated equipment without stopping and only rest on track to give her feet a break. We will continue to monitor her progress.      Expected Outcomes Short: Use RPE daily to regulate intensity. Long: Follow program prescription in THR. Short: Continue to attend rehab regularly Long: Continue to follow program prescription               Discharge Exercise Prescription (Final Exercise Prescription Changes):  Exercise Prescription Changes - 11/07/21 1500       Response to Exercise   Blood Pressure (Admit) 120/62    Blood Pressure (Exercise) 122/82    Blood Pressure (Exit) 118/58    Heart Rate (Admit) 70 bpm  Heart Rate (Exercise) 118 bpm    Heart Rate (Exit) 89 bpm    Rating of Perceived Exertion (Exercise) 12    Symptoms foot pain    Duration Progress to 30 minutes of  aerobic without signs/symptoms of physical distress    Intensity THRR unchanged      Progression   Progression Continue to progress workloads to maintain intensity without signs/symptoms of physical distress.    Average METs 2.06      Resistance Training   Training  Prescription Yes    Weight 3 lb    Reps 10-15      Interval Training   Interval Training No      NuStep   Level 4    Minutes 15    METs 2.3      REL-XR   Level 1    Minutes 15      Track   Laps 15    Minutes 15    METs 1.82      Oxygen   Maintain Oxygen Saturation 88% or higher             Nutrition:  Target Goals: Understanding of nutrition guidelines, daily intake of sodium 1500mg , cholesterol 200mg , calories 30% from fat and 7% or less from saturated fats, daily to have 5 or more servings of fruits and vegetables.  Education: All About Nutrition: -Group instruction provided by verbal, written material, interactive activities, discussions, models, and posters to present general guidelines for heart healthy nutrition including fat, fiber, MyPlate, the role of sodium in heart healthy nutrition, utilization of the nutrition label, and utilization of this knowledge for meal planning. Follow up email sent as well. Written material given at graduation. Flowsheet Row Cardiac Rehab from 10/17/2021 in [facility] Cardiac and Pulmonary Rehab  Education need identified 10/17/21       Biometrics:  Pre Biometrics - 10/17/21 1207       Pre Biometrics   Height 5\' 6"  (1.676 m)    Weight 156 lb 6.4 oz (70.9 kg)    BMI (Calculated) 25.26    Single Leg Stand 4.53 seconds              Nutrition Therapy Plan and Nutrition Goals:  Nutrition Therapy & Goals - 10/17/21 1208       Intervention Plan   Intervention Prescribe, educate and counsel regarding individualized specific dietary modifications aiming towards targeted core components such as weight, hypertension, lipid management, diabetes, heart failure and other comorbidities.    Expected Outcomes Short Term Goal: Understand basic principles of dietary content, such as calories, fat, sodium, cholesterol and nutrients.;Short Term Goal: A plan has been developed with personal nutrition goals set during dietitian  appointment.;Long Term Goal: Adherence to prescribed nutrition plan.             Nutrition Assessments:  MEDIFICTS Score Key: ?70 Need to make dietary changes  40-70 Heart Healthy Diet ? 40 Therapeutic Level Cholesterol Diet  Flowsheet Row Cardiac Rehab from 10/17/2021 in [facility] Cardiac and Pulmonary Rehab  Picture Your Plate Total Score on Admission 71      Picture Your Plate Scores: <85 Unhealthy dietary pattern with much room for improvement. 41-50 Dietary pattern unlikely to meet recommendations for good health and room for improvement. 51-60 More healthful dietary pattern, with some room for improvement.  >60 Healthy dietary pattern, although there may be some specific behaviors that could be improved.    Nutrition Goals Re-Evaluation:   Nutrition Goals Discharge (Final Nutrition Goals  Re-Evaluation):   Psychosocial: Target Goals: Acknowledge presence or absence of significant depression and/or stress, maximize coping skills, provide positive support system. Participant is able to verbalize types and ability to use techniques and skills needed for reducing stress and depression.   Education: Stress, Anxiety, and Depression - Group verbal and visual presentation to define topics covered.  Reviews how body is impacted by stress, anxiety, and depression.  Also discusses healthy ways to reduce stress and to treat/manage anxiety and depression.  Written material given at graduation.   Education: Sleep Hygiene -Provides group verbal and written instruction about how sleep can affect your health.  Define sleep hygiene, discuss sleep cycles and impact of sleep habits. Review good sleep hygiene tips.    Initial Review & Psychosocial Screening:  Initial Psych Review & Screening - 10/06/21 1319       Initial Review   Current issues with Current Stress Concerns    Source of Stress Concerns Family    Comments Stress with husband needing total care.  Does have helpers every day  so she can do what she needs to do.      Family Dynamics   Good Support System? Yes   daughter 2 grandkids     Barriers   Psychosocial barriers to participate in program The patient should benefit from training in stress management and relaxation.      Screening Interventions   Interventions Encouraged to exercise;To provide support and resources with identified psychosocial needs;Provide feedback about the scores to participant    Expected Outcomes Short Term goal: Utilizing psychosocial counselor, staff and physician to assist with identification of specific Stressors or current issues interfering with healing process. Setting desired goal for each stressor or current issue identified.;Long Term Goal: Stressors or current issues are controlled or eliminated.;Short Term goal: Identification and review with participant of any Quality of Life or Depression concerns found by scoring the questionnaire.;Long Term goal: The participant improves quality of Life and PHQ9 Scores as seen by post scores and/or verbalization of changes             Quality of Life Scores:   Quality of Life - 10/17/21 1209       Quality of Life   Select Quality of Life      Quality of Life Scores   Health/Function Pre 11.36 %    Socioeconomic Pre 13.13 %    Psych/Spiritual Pre 10.92 %    Family Pre 20.63 %    GLOBAL Pre 12.84 %            Scores of 19 and below usually indicate a poorer quality of life in these areas.  A difference of  2-3 points is a clinically meaningful difference.  A difference of 2-3 points in the total score of the Quality of Life Index has been associated with significant improvement in overall quality of life, self-image, physical symptoms, and general health in studies assessing change in quality of life.  PHQ-9: Review Flowsheet       10/19/2021 10/17/2021 09/08/2021  Depression screen PHQ 2/9  Decreased Interest 2 2 1   Down, Depressed, Hopeless 1 2 2   PHQ - 2 Score 3 4 3    Altered sleeping 2 3 2   Tired, decreased energy 1 3 1   Change in appetite 1 1 1   Feeling bad or failure about yourself  1 1 1   Trouble concentrating 1 1 0  Moving slowly or fidgety/restless 0 0 0  Suicidal thoughts 0 1 0  PHQ-9 Score Difficult doing work/chores Somewhat difficult Somewhat difficult Not difficult at all   Interpretation of Total Score  Total Score Depression Severity:  1-4 = Minimal depression, 5-9 = Mild depression, 10-14 = Moderate depression, 15-19 = Moderately severe depression, 20-27 = Severe depression   Psychosocial Evaluation and Intervention:  Psychosocial Evaluation - 10/06/21 1326       Psychosocial Evaluation & Interventions   Interventions Encouraged to exercise with the program and follow exercise prescription    Comments Annice Pih has no barriers to attending the program. She does have stress at home in that her husband requires fulltime care. They do have help for his care so she can have time to take care of herself. She has a daughter and two grandkids that live in [place] as her support. She is ready to get started with the program in hope that she will gain more strentgh and stamina to be able not to tire out every afternoon.    Expected Outcomes STG Annice Pih will attend all scheduled sessions, she will have time to work on her health and stamina. LTG Annice Pih will be able to continue to care for herself maintaining the program she started here at her home    Continue Psychosocial Services  Follow up required by staff             Psychosocial Re-Evaluation:   Psychosocial Discharge (Final Psychosocial Re-Evaluation):   Vocational Rehabilitation: Provide vocational rehab assistance to qualifying candidates.   Vocational Rehab Evaluation & Intervention:   Education: Education Goals: Education classes will be provided on a variety of topics geared toward better understanding of heart health and risk factor modification. Participant  will state understanding/return demonstration of topics presented as noted by education test scores.  Learning Barriers/Preferences:   General Cardiac Education Topics:  AED/CPR: - Group verbal and written instruction with the use of models to demonstrate the basic use of the AED with the basic ABC's of resuscitation.   Anatomy and Cardiac Procedures: - Group verbal and visual presentation and models provide information about basic cardiac anatomy and function. Reviews the testing methods done to diagnose heart disease and the outcomes of the test results. Describes the treatment choices: Medical Management, Angioplasty, or Coronary Bypass Surgery for treating various heart conditions including Myocardial Infarction, Angina, Valve Disease, and Cardiac Arrhythmias.  Written material given at graduation.   Medication Safety: - Group verbal and visual instruction to review commonly prescribed medications for heart and lung disease. Reviews the medication, class of the drug, and side effects. Includes the steps to properly store meds and maintain the prescription regimen.  Written material given at graduation.   Intimacy: - Group verbal instruction through game format to discuss how heart and lung disease can affect sexual intimacy. Written material given at graduation..   Know Your Numbers and Heart Failure: - Group verbal and visual instruction to discuss disease risk factors for cardiac and pulmonary disease and treatment options.  Reviews associated critical values for Overweight/Obesity, Hypertension, Cholesterol, and Diabetes.  Discusses basics of heart failure: signs/symptoms and treatments.  Introduces Heart Failure Zone chart for action plan for heart failure.  Written material given at graduation.   Infection Prevention: - Provides verbal and written material to individual with discussion of infection control including proper hand washing and proper equipment cleaning during  exercise session. Flowsheet Row Cardiac Rehab from 10/17/2021 in [facility] Cardiac and Pulmonary Rehab  Date 10/17/21  Educator [employer]  Instruction Review Code 1-  Verbalizes Understanding       Falls Prevention: - Provides verbal and written material to individual with discussion of falls prevention and safety. Flowsheet Row Cardiac Rehab from 10/17/2021 in [facility] Cardiac and Pulmonary Rehab  Date 10/17/21  Educator [facility]  Instruction Review Code 1- Verbalizes Understanding       Other: -Provides group and verbal instruction on various topics (see comments)   Knowledge Questionnaire Score:  Knowledge Questionnaire Score - 10/17/21 1210       Knowledge Questionnaire Score   Pre Score 24/26             Core Components/Risk Factors/Patient Goals at Admission:  Personal Goals and Risk Factors at Admission - 10/17/21 1208       Core Components/Risk Factors/Patient Goals on Admission    Weight Management Yes    Intervention Weight Management: Develop a combined nutrition and exercise program designed to reach desired caloric intake, while maintaining appropriate intake of nutrient and fiber, sodium and fats, and appropriate energy expenditure required for the weight goal.;Weight Management: Provide education and appropriate resources to help participant work on and attain dietary goals.    Admit Weight 156 lb 6.4 oz (70.9 kg)    Goal Weight: Short Term 150 lb (68 kg)    Goal Weight: Long Term 150 lb (68 kg)    Expected Outcomes Short Term: Continue to assess and modify interventions until short term weight is achieved;Long Term: Adherence to nutrition and physical activity/exercise program aimed toward attainment of established weight goal;Weight Maintenance: Understanding of the daily nutrition guidelines, which includes 25-35% calories from fat, 7% or less cal from saturated fats, less than 200mg  cholesterol, less than 1.5gm of sodium, & 5 or more servings of fruits and vegetables  daily;Understanding recommendations for meals to include 15-35% energy as protein, 25-35% energy from fat, 35-60% energy from carbohydrates, less than 200mg  of dietary cholesterol, 20-35 gm of total fiber daily;Understanding of distribution of calorie intake throughout the day with the consumption of 4-5 meals/snacks    Heart Failure Yes    Intervention Provide a combined exercise and nutrition program that is supplemented with education, support and counseling about heart failure. Directed toward relieving symptoms such as shortness of breath, decreased exercise tolerance, and extremity edema.    Expected Outcomes Improve functional capacity of life;Short term: Attendance in program 2-3 days a week with increased exercise capacity. Reported lower sodium intake. Reported increased fruit and vegetable intake. Reports medication compliance.;Short term: Daily weights obtained and reported for increase. Utilizing diuretic protocols set by physician.;Long term: Adoption of self-care skills and reduction of barriers for early signs and symptoms recognition and intervention leading to self-care maintenance.    Lipids Yes    Intervention Provide education and support for participant on nutrition & aerobic/resistive exercise along with prescribed medications to achieve LDL 70mg , HDL >40mg .    Expected Outcomes Short Term: Participant states understanding of desired cholesterol values and is compliant with medications prescribed. Participant is following exercise prescription and nutrition guidelines.;Long Term: Cholesterol controlled with medications as prescribed, with individualized exercise RX and with personalized nutrition plan. Value goals: LDL < 70mg , HDL > 40 mg.             Education:Diabetes - Individual verbal and written instruction to review signs/symptoms of diabetes, desired ranges of glucose level fasting, after meals and with exercise. Acknowledge that pre and post exercise glucose checks  will be done for 3 sessions at entry of program.   Core Components/Risk Factors/Patient Goals  Review:    Core Components/Risk Factors/Patient Goals at Discharge (Final Review):    ITP Comments:  ITP Comments     Row Name 10/06/21 1331 10/17/21 1159 10/24/21 0956 11/08/21 0955     ITP Comments Virtual orientation call completed today. shehas an appointment on Date: 10/17/2021  for EP eval and gym Orientation.  Documentation of diagnosis can be found in [facility] Date: 07/17/2021 . Completed and gym orientation. Initial ITP created and sent for review to Dr. Bethann Punches, Medical Director. First full day of exercise!  Patient was oriented to gym and equipment including functions, settings, policies, and procedures.  Patient's individual exercise prescription and treatment plan were reviewed.  All starting workloads were established based on the results of the 6 minute walk test done at initial orientation visit.  The plan for exercise progression was also introduced and progression will be customized based on patient's performance and goals. 30 Day review completed. Medical Director ITP review done, changes made as directed, and signed approval by Medical Director.             Comments:

## 2021-11-09 DIAGNOSIS — Z955 Presence of coronary angioplasty implant and graft: Secondary | ICD-10-CM | POA: Diagnosis not present

## 2021-11-09 DIAGNOSIS — I214 Non-ST elevation (NSTEMI) myocardial infarction: Secondary | ICD-10-CM

## 2021-11-09 NOTE — Progress Notes (Signed)
Daily Session Note  Patient Details  Name: Veronica Romero MRN: [id number] Date of Birth: April 16, 1951 Referring Provider:   Flowsheet Row Cardiac Rehab from 10/17/2021 in [facility] Cardiac and Pulmonary Rehab  Referring Provider Berniece Salines       Encounter Date: 11/09/2021  Check In:  Session Check In - 11/09/21 1557       Check-In   Supervising physician immediately available to respond to emergencies See telemetry face sheet for immediately available ER MD    Staff Present Vida Rigger, RN, BSN;Joseph Kamaili, RCP,RRT,BSRT;Melissa [place], RDN, LDN;Jessica [place], [place], RCEP, CCRP, CCET    Virtual Visit No    Medication changes reported     No    Fall or balance concerns reported    No    Tobacco Cessation No Change    Warm-up and Cool-down Performed on first and last piece of equipment    Resistance Training Performed Yes    VAD Patient? No    PAD/SET Patient? No      Pain Assessment   Currently in Pain? No/denies                Social History   Tobacco Use  Smoking Status Never  Smokeless Tobacco Never    Goals Met:  Independence with exercise equipment Exercise tolerated well No report of concerns or symptoms today Strength training completed today  Goals Unmet:  Not Applicable  Comments: Pt able to follow exercise prescription today without complaint.  Will continue to monitor for progression.  Reviewed home exercise with pt today.  Pt plans to walk and go up to gym in her apartment complex at home for exercise.  We also talked about using our staff videos at home so that she doesn't have to leave her husband.  Reviewed THR, pulse, RPE, sign and symptoms, pulse oximetery and when to call 911 or MD.  Also discussed weather considerations and indoor options.  Pt voiced understanding.  Dr. Emily Filbert is Medical Director for [place].  Dr. Ottie Glazier is Medical Director for [facility] Pulmonary Rehabilitation.

## 2021-11-11 ENCOUNTER — Other Ambulatory Visit (HOSPITAL_COMMUNITY): Payer: Self-pay

## 2021-11-14 DIAGNOSIS — I214 Non-ST elevation (NSTEMI) myocardial infarction: Secondary | ICD-10-CM

## 2021-11-14 DIAGNOSIS — Z955 Presence of coronary angioplasty implant and graft: Secondary | ICD-10-CM

## 2021-11-14 NOTE — Progress Notes (Signed)
Daily Session Note  Patient Details  Name: Veronica Romero MRN: [id number] Date of Birth: 10-03-1950 Referring Provider:   Flowsheet Row Cardiac Rehab from 10/17/2021 in [facility] Cardiac and Pulmonary Rehab  Referring Provider Berniece Salines       Encounter Date: 11/14/2021  Check In:  Session Check In - 11/14/21 0958       Check-In   Supervising physician immediately available to respond to emergencies See telemetry face sheet for immediately available ER MD    Location [facility]-Cardiac & Pulmonary Rehab    Staff Present Earlean Shawl, BS, ACSM CEP, Exercise Physiologist;Kate Sweetman, RN,BC,MSN;Jessica [place], MA, RCEP, CCRP, CCET    Virtual Visit No    Medication changes reported     No    Fall or balance concerns reported    No    Tobacco Cessation No Change    Warm-up and Cool-down Performed on first and last piece of equipment    Resistance Training Performed Yes    VAD Patient? No    PAD/SET Patient? No      Pain Assessment   Currently in Pain? No/denies    Multiple Pain Sites No                Social History   Tobacco Use  Smoking Status Never  Smokeless Tobacco Never    Goals Met:  Independence with exercise equipment Exercise tolerated well No report of concerns or symptoms today  Goals Unmet:  Not Applicable  Comments: Pt able to follow exercise prescription today without complaint.  Will continue to monitor for progression.    Dr. Emily Filbert is Medical Director for [place].  Dr. Ottie Glazier is Medical Director for [facility] Pulmonary Rehabilitation.

## 2021-11-16 ENCOUNTER — Encounter: Payer: Medicare Other | Admitting: *Deleted

## 2021-11-16 DIAGNOSIS — Z955 Presence of coronary angioplasty implant and graft: Secondary | ICD-10-CM | POA: Diagnosis not present

## 2021-11-16 DIAGNOSIS — I214 Non-ST elevation (NSTEMI) myocardial infarction: Secondary | ICD-10-CM

## 2021-11-16 NOTE — Progress Notes (Signed)
Daily Session Note  Patient Details  Name: Veronica Romero MRN: [id number] Date of Birth: 10-18-1950 Referring Provider:   Flowsheet Row Cardiac Rehab from 10/17/2021 in [facility] Cardiac and Pulmonary Rehab  Referring Provider Berniece Salines       Encounter Date: 11/16/2021  Check In:  Session Check In - 11/16/21 1533       Check-In   Supervising physician immediately available to respond to emergencies See telemetry face sheet for immediately available ER MD    Location [facility]-Cardiac & Pulmonary Rehab    Staff Present Alberteen Sam, MA, RCEP, CCRP, CCET;Feliz Herard Sherryll Burger, RN BSN;Melissa [place], RDN, LDN    Virtual Visit No    Medication changes reported     No    Fall or balance concerns reported    No    Warm-up and Cool-down Performed on first and last piece of equipment    Resistance Training Performed Yes    VAD Patient? No    PAD/SET Patient? No      Pain Assessment   Currently in Pain? No/denies                Social History   Tobacco Use  Smoking Status Never  Smokeless Tobacco Never    Goals Met:  Independence with exercise equipment Exercise tolerated well No report of concerns or symptoms today Strength training completed today  Goals Unmet:  Not Applicable  Comments: Pt able to follow exercise prescription today without complaint.  Will continue to monitor for progression.    Dr. Emily Filbert is Medical Director for [place].  Dr. Ottie Glazier is Medical Director for [facility] Pulmonary Rehabilitation.

## 2021-11-21 DIAGNOSIS — Z955 Presence of coronary angioplasty implant and graft: Secondary | ICD-10-CM | POA: Diagnosis not present

## 2021-11-21 DIAGNOSIS — I214 Non-ST elevation (NSTEMI) myocardial infarction: Secondary | ICD-10-CM

## 2021-11-21 NOTE — Progress Notes (Signed)
Daily Session Note  Patient Details  Name: Veronica Romero MRN: [id number] Date of Birth: 10-11-1950 Referring Provider:   Flowsheet Row Cardiac Rehab from 10/17/2021 in [facility] Cardiac and Pulmonary Rehab  Referring Provider Berniece Salines       Encounter Date: 11/21/2021  Check In:  Session Check In - 11/21/21 0945       Check-In   Supervising physician immediately available to respond to emergencies See telemetry face sheet for immediately available ER MD    Location [facility]-Cardiac & Pulmonary Rehab    Staff Present Earlean Shawl, BS, ACSM CEP, Exercise Physiologist;Eileene Kisling, RN,BC,MSN;Jessica [place], MA, RCEP, CCRP, CCET    Virtual Visit No    Medication changes reported     No    Fall or balance concerns reported    No    Tobacco Cessation No Change    Warm-up and Cool-down Performed on first and last piece of equipment    Resistance Training Performed Yes    VAD Patient? No    PAD/SET Patient? No      Pain Assessment   Currently in Pain? No/denies    Multiple Pain Sites No                Social History   Tobacco Use  Smoking Status Never  Smokeless Tobacco Never    Goals Met:  Independence with exercise equipment Exercise tolerated well No report of concerns or symptoms today Strength training completed today  Goals Unmet:  Not Applicable  Comments: Pt able to follow exercise prescription today without complaint.  Will continue to monitor for progression.    Dr. Emily Filbert is Medical Director for [place].  Dr. Ottie Glazier is Medical Director for [facility] Pulmonary Rehabilitation.

## 2021-11-23 ENCOUNTER — Encounter: Payer: Medicare Other | Admitting: *Deleted

## 2021-11-23 DIAGNOSIS — I214 Non-ST elevation (NSTEMI) myocardial infarction: Secondary | ICD-10-CM

## 2021-11-23 DIAGNOSIS — Z955 Presence of coronary angioplasty implant and graft: Secondary | ICD-10-CM | POA: Diagnosis not present

## 2021-11-23 NOTE — Progress Notes (Signed)
Daily Session Note  Patient Details  Name: Veronica Romero MRN: [id number] Date of Birth: 04-Nov-1950 Referring Provider:   Flowsheet Row Cardiac Rehab from 10/17/2021 in [facility] Cardiac and Pulmonary Rehab  Referring Provider Berniece Salines       Encounter Date: 11/23/2021  Check In:  Session Check In - 11/23/21 1545       Check-In   Supervising physician immediately available to respond to emergencies See telemetry face sheet for immediately available ER MD    Location [facility]-Cardiac & Pulmonary Rehab    Staff Present Hope Budds, RDN, LDN;Jessica Luan [place], MA, RCEP, CCRP, CCET;Thatcher [place] Sherryll Burger, RN BSN    Virtual Visit No    Medication changes reported     No    Fall or balance concerns reported    No    Warm-up and Cool-down Performed on first and last piece of equipment    Resistance Training Performed Yes    VAD Patient? No    PAD/SET Patient? No      Pain Assessment   Currently in Pain? No/denies                Social History   Tobacco Use  Smoking Status Never  Smokeless Tobacco Never    Goals Met:  Independence with exercise equipment Exercise tolerated well No report of concerns or symptoms today Strength training completed today  Goals Unmet:  Not Applicable  Comments: Pt able to follow exercise prescription today without complaint.  Will continue to monitor for progression.    Dr. Emily Filbert is Medical Director for [place].  Dr. Ottie Glazier is Medical Director for [facility] Pulmonary Rehabilitation.

## 2021-11-28 ENCOUNTER — Encounter: Payer: Medicare Other | Attending: Cardiology | Admitting: *Deleted

## 2021-11-28 DIAGNOSIS — Z4889 Encounter for other specified surgical aftercare: Secondary | ICD-10-CM | POA: Insufficient documentation

## 2021-11-28 DIAGNOSIS — I214 Non-ST elevation (NSTEMI) myocardial infarction: Secondary | ICD-10-CM | POA: Diagnosis present

## 2021-11-28 DIAGNOSIS — I252 Old myocardial infarction: Secondary | ICD-10-CM | POA: Diagnosis not present

## 2021-11-28 DIAGNOSIS — Z955 Presence of coronary angioplasty implant and graft: Secondary | ICD-10-CM | POA: Diagnosis present

## 2021-11-28 NOTE — Progress Notes (Signed)
Daily Session Note  Patient Details  Name: Veronica Romero MRN: [id number] Date of Birth: 1950/10/10 Referring Provider:   Flowsheet Row Cardiac Rehab from 10/17/2021 in [facility] Cardiac and Pulmonary Rehab  Referring Provider Berniece Salines       Encounter Date: 11/28/2021  Check In:  Session Check In - 11/28/21 1050       Check-In   Supervising physician immediately available to respond to emergencies See telemetry face sheet for immediately available ER MD    Location [facility]-Cardiac & Pulmonary Rehab    Staff Present Antionette Fairy, BS, Exercise Physiologist;Kelly Amedeo Plenty, BS, ACSM CEP, Exercise Physiologist;Javontae Marlette, RN, BSN, CCRP    Virtual Visit No    Medication changes reported     No    Fall or balance concerns reported    No    Warm-up and Cool-down Performed on first and last piece of equipment    Resistance Training Performed Yes    VAD Patient? No    PAD/SET Patient? No      Pain Assessment   Currently in Pain? No/denies                Social History   Tobacco Use  Smoking Status Never  Smokeless Tobacco Never    Goals Met:  Independence with exercise equipment Exercise tolerated well No report of concerns or symptoms today  Goals Unmet:  Not Applicable  Comments: Pt able to follow exercise prescription today without complaint.  Will continue to monitor for progression.    Dr. Emily Filbert is Medical Director for [place].  Dr. Ottie Glazier is Medical Director for [facility] Pulmonary Rehabilitation.

## 2021-11-29 DIAGNOSIS — Z955 Presence of coronary angioplasty implant and graft: Secondary | ICD-10-CM

## 2021-11-29 DIAGNOSIS — I214 Non-ST elevation (NSTEMI) myocardial infarction: Secondary | ICD-10-CM

## 2021-11-29 NOTE — Progress Notes (Signed)
Completed initial RD consultation ?

## 2021-11-30 ENCOUNTER — Encounter: Payer: Medicare Other | Admitting: *Deleted

## 2021-11-30 ENCOUNTER — Encounter: Payer: Self-pay | Admitting: Nurse Practitioner

## 2021-11-30 DIAGNOSIS — I214 Non-ST elevation (NSTEMI) myocardial infarction: Secondary | ICD-10-CM

## 2021-11-30 DIAGNOSIS — Z955 Presence of coronary angioplasty implant and graft: Secondary | ICD-10-CM

## 2021-11-30 NOTE — Progress Notes (Signed)
Daily Session Note  Patient Details  Name: Veronica Romero MRN: [id number] Date of Birth: [birth year] Referring Provider:   Flowsheet Row Cardiac Rehab from 10/17/2021 in [facility] Cardiac and Pulmonary Rehab  Referring Provider Berniece Salines       Encounter Date: 11/30/2021  Check In:  Session Check In - 11/30/21 1615       Check-In   Supervising physician immediately available to respond to emergencies See telemetry face sheet for immediately available ER MD    Location [facility]-Cardiac & Pulmonary Rehab    Staff Present Heath Lark, RN, BSN, Jacklynn Bue, MS, ASCM CEP, Exercise Physiologist;Meredith Sherryll Burger, RN BSN    Virtual Visit No    Medication changes reported     No    Fall or balance concerns reported    No    Warm-up and Cool-down Performed on first and last piece of equipment    Resistance Training Performed Yes    VAD Patient? No    PAD/SET Patient? No      Pain Assessment   Currently in Pain? No/denies                Social History   Tobacco Use  Smoking Status Never  Smokeless Tobacco Never    Goals Met:  Independence with exercise equipment Exercise tolerated well No report of concerns or symptoms today  Goals Unmet:  Not Applicable  Comments: Pt able to follow exercise prescription today without complaint.  Will continue to monitor for progression.    Dr. Emily Filbert is Medical Director for [place].  Dr. Ottie Glazier is Medical Director for [facility] Pulmonary Rehabilitation.

## 2021-12-01 DIAGNOSIS — M059 Rheumatoid arthritis with rheumatoid factor, unspecified: Principal | ICD-10-CM

## 2021-12-01 DIAGNOSIS — M05741 Rheumatoid arthritis with rheumatoid factor of right hand without organ or systems involvement: Principal | ICD-10-CM

## 2021-12-01 DIAGNOSIS — M05742 Rheumatoid arthritis with rheumatoid factor of left hand without organ or systems involvement: Principal | ICD-10-CM

## 2021-12-01 DIAGNOSIS — M0579 Rheumatoid arthritis with rheumatoid factor of multiple sites without organ or systems involvement: Principal | ICD-10-CM

## 2021-12-01 DIAGNOSIS — M25561 Pain in right knee: Principal | ICD-10-CM

## 2021-12-01 DIAGNOSIS — M25461 Effusion, right knee: Principal | ICD-10-CM

## 2021-12-01 MED ORDER — TRAMADOL 50 MG TABLET
ORAL_TABLET | 0 refills | 0 days
Start: 2021-12-01 — End: ?

## 2021-12-03 MED ORDER — TRAMADOL 50 MG TABLET
ORAL_TABLET | 0 refills | 0 days | Status: CP
Start: 2021-12-03 — End: ?

## 2021-12-04 ENCOUNTER — Other Ambulatory Visit (HOSPITAL_COMMUNITY): Payer: Self-pay

## 2021-12-05 ENCOUNTER — Encounter: Payer: Medicare Other | Admitting: *Deleted

## 2021-12-05 ENCOUNTER — Other Ambulatory Visit (HOSPITAL_COMMUNITY): Payer: Self-pay

## 2021-12-05 DIAGNOSIS — Z955 Presence of coronary angioplasty implant and graft: Secondary | ICD-10-CM | POA: Diagnosis not present

## 2021-12-05 DIAGNOSIS — I214 Non-ST elevation (NSTEMI) myocardial infarction: Secondary | ICD-10-CM

## 2021-12-05 NOTE — Progress Notes (Signed)
Daily Session Note  Patient Details  Name: Veronica Romero MRN: [id number] Date of Birth: [birth year] Referring Provider:   Flowsheet Row Cardiac Rehab from 10/17/2021 in [facility] Cardiac and Pulmonary Rehab  Referring Provider Berniece Salines       Encounter Date: 12/05/2021  Check In:  Session Check In - 12/05/21 1133       Check-In   Supervising physician immediately available to respond to emergencies See telemetry face sheet for immediately available ER MD    Location [facility]-Cardiac & Pulmonary Rehab    Staff Present Nyoka Cowden, RN, BSN, MA;Susanne Bice, RN, BSN, CCRP;Jessica [place], MA, RCEP, CCRP, CCET;Melissa [place], [place], LDN    Virtual Visit No    Medication changes reported     No    Fall or balance concerns reported    No    Tobacco Cessation No Change    Warm-up and Cool-down Performed on first and last piece of equipment    Resistance Training Performed Yes    VAD Patient? No    PAD/SET Patient? No      Pain Assessment   Currently in Pain? No/denies                Social History   Tobacco Use  Smoking Status Never  Smokeless Tobacco Never    Goals Met:  Independence with exercise equipment Exercise tolerated well No report of concerns or symptoms today  Goals Unmet:  Not Applicable  Comments: Pt able to follow exercise prescription today without complaint.  Will continue to monitor for progression.    Dr. Emily Filbert is Medical Director for [place].  Dr. Ottie Glazier is Medical Director for [facility] Pulmonary Rehabilitation.

## 2021-12-06 ENCOUNTER — Encounter: Payer: Self-pay | Admitting: *Deleted

## 2021-12-06 ENCOUNTER — Other Ambulatory Visit (HOSPITAL_COMMUNITY): Payer: Self-pay

## 2021-12-06 DIAGNOSIS — Z955 Presence of coronary angioplasty implant and graft: Secondary | ICD-10-CM

## 2021-12-06 DIAGNOSIS — I214 Non-ST elevation (NSTEMI) myocardial infarction: Secondary | ICD-10-CM

## 2021-12-06 NOTE — Progress Notes (Signed)
Cardiac Individual Treatment Plan  Patient Details  Name: Veronica Romero MRN: [id number] Date of Birth: [birth year] Referring Provider:   Flowsheet Row Cardiac Rehab from 10/17/2021 in [facility] Cardiac and Pulmonary Rehab  Referring Provider Berniece Salines       Initial Encounter Date:  Flowsheet Row Cardiac Rehab from 10/17/2021 in [facility] Cardiac and Pulmonary Rehab  Date 10/17/21       Visit Diagnosis: NSTEMI (non-ST elevation myocardial infarction) [facility])  Status post coronary artery stent placement  Patient's Home Medications on Admission:  Current Outpatient Medications:    Adalimumab (HUMIRA PEN) 40 MG/0.4ML PNKT, Inject 0.4 mLs into the skin every Tuesday. Tuesday, Disp: , Rfl:    aspirin 81 MG EC tablet, Take 1 tablet (81 mg total) by mouth daily. Swallow whole., Disp: 90 tablet, Rfl: 1   carvedilol (COREG) 3.125 MG tablet, Take 1 tablet (3.125 mg total) by mouth 2 (two) times daily with a meal., Disp: 180 tablet, Rfl: 2   Cholecalciferol (VITAMIN D3 PO), Take 1 tablet by mouth daily., Disp: , Rfl:    clopidogrel (PLAVIX) 75 MG tablet, Take 1 tablet (75 mg total) by mouth daily., Disp: 90 tablet, Rfl: 2   empagliflozin (JARDIANCE) 10 MG TABS tablet, Take 1 tablet (10 mg total) by mouth daily before breakfast. (Patient not taking: Reported on 10/06/2021), Disp: 90 tablet, Rfl: 3   furosemide (LASIX) 40 MG tablet, Take 1 tablet (40 mg total) by mouth every other day., Disp: 45 tablet, Rfl: 3   leucovorin (WELLCOVORIN) 5 MG tablet, Take 15 mg by mouth every Saturday., Disp: , Rfl:    losartan (COZAAR) 25 MG tablet, Take 1/2 tablet (12.5 mg total) by mouth daily., Disp: 30 tablet, Rfl: 2   methotrexate 50 MG/2ML injection, Inject 1 mL (25 mg total) into the muscle every Saturday. Hold methotrexate for 2 more weeks and then resume 3/10, Disp: , Rfl:    Multiple Vitamin (MULTIVITAMIN WITH MINERALS) TABS tablet, Take 1 tablet by mouth daily., Disp: , Rfl:    nitroGLYCERIN (NITROSTAT) 0.4 MG  SL tablet, Place 1 tablet (0.4 mg total) under the tongue every 5 (five) minutes as needed., Disp: 25 tablet, Rfl: 2   Polyethyl Glycol-Propyl Glycol (LUBRICANT EYE DROPS) 0.4-0.3 % SOLN, Place 1 drop into both eyes 3 (three) times daily as needed (dry/irritated eyes.)., Disp: , Rfl:    potassium chloride SA (KLOR-CON M) 20 MEQ tablet, Take 1 tablet (20 mEq total) by mouth daily., Disp: 30 tablet, Rfl: 1   predniSONE (DELTASONE) 5 MG tablet, Take 10 mg by mouth in the morning., Disp: , Rfl:    rosuvastatin (CRESTOR) 40 MG tablet, Take 1 tablet (40 mg total) by mouth daily., Disp: 90 tablet, Rfl: 0   spironolactone (ALDACTONE) 25 MG tablet, Take 1/2 tablet (12.5 mg total) by mouth daily., Disp: 30 tablet, Rfl: 2   traMADol (ULTRAM) 50 MG tablet, Take 50 mg by mouth every 6 (six) hours as needed (pain.)., Disp: , Rfl:   Past Medical History: Past Medical History:  Diagnosis Date   Ischemic cardiomyopathy 06/12/2021   EF 25-30%   Rheumatoid arthritis ([place])     Tobacco Use: Social History   Tobacco Use  Smoking Status Never  Smokeless Tobacco Never    Labs: Review Flowsheet       Latest Ref Rng & Units 06/12/2021 06/13/2021 06/16/2021  Labs for ITP Cardiac and Pulmonary Rehab  Cholestrol 0 - 200 mg/dL - - 177   LDL (calc) 0 - 99  mg/dL - - 116   HDL-C >40 mg/dL - - 38   Trlycerides <150 mg/dL - 66  115   Hemoglobin A1c 4.8 - 5.6 % 5.4  - -  PH, Arterial 7.35 - 7.45 7.451  7.155  7.359  7.471  7.518  7.507  7.513   PCO2 arterial 32 - 48 mmHg 39.0  72.5  41.8  29.8  37.0  38.0  39.7   Bicarbonate 20.0 - 28.0 mmol/L 26.6  24.7  23.7  22.0  30.1  30.1  31.9   TCO2 22 - 32 mmol/L $RemoveB'28  27  25  23  31  31  'MIKedntX$ 33   Acid-base deficit 0.0 - 2.0 mmol/L 4.0  2.0  1.0  -  O2 Saturation % 100.0  94.0  97.0  96.0  94  70  68      Exercise Target Goals: Exercise Program Goal: Individual exercise prescription set using results from initial 6 min walk test and THRR while considering  patient's  activity barriers and safety.   Exercise Prescription Goal: Initial exercise prescription builds to 30-45 minutes a day of aerobic activity, 2-3 days per week.  Home exercise guidelines will be given to patient during program as part of exercise prescription that the participant will acknowledge.   Education: Aerobic Exercise: - Group verbal and visual presentation on the components of exercise prescription. Introduces F.I.T.T principle from ACSM for exercise prescriptions.  Reviews F.I.T.T. principles of aerobic exercise including progression. Written material given at graduation. Flowsheet Row Cardiac Rehab from 10/17/2021 in [facility] Cardiac [facility] Pulmonary Rehab  Education need identified 10/17/21       Education: Resistance Exercise: - Group verbal and visual presentation on the components of exercise prescription. Introduces F.I.T.T principle from ACSM for exercise prescriptions  Reviews F.I.T.T. principles of resistance exercise including progression. Written material given at graduation.    Education: Exercise & Equipment Safety: - Individual verbal instruction and demonstration of equipment use and safety with use of the equipment. Flowsheet Row Cardiac Rehab from 10/17/2021 in [facility] Cardiac and Pulmonary Rehab  Date 10/17/21  Educator [facility]  Instruction Review Code 1- Verbalizes Understanding       Education: Exercise Physiology & General Exercise Guidelines: - Group verbal and written instruction with models to review the exercise physiology of the cardiovascular system and associated critical values. Provides general exercise guidelines with specific guidelines to those with heart or lung disease.    Education: Flexibility, Balance, Mind/Body Relaxation: - Group verbal and visual presentation with interactive activity on the components of exercise prescription. Introduces F.I.T.T principle from ACSM for exercise prescriptions. Reviews F.I.T.T. principles of flexibility and balance  exercise training including progression. Also discusses the mind body connection.  Reviews various relaxation techniques to help reduce and manage stress (i.e. Deep breathing, progressive muscle relaxation, and visualization). Balance handout provided to take home. Written material given at graduation.   Activity Barriers & Risk Stratification:  Activity Barriers & Cardiac Risk Stratification - 10/06/21 1316       Activity Barriers & Cardiac Risk Stratification   Activity Barriers Arthritis    Cardiac Risk Stratification Moderate             6 Minute Walk:  6 Minute Walk     Row Name 10/17/21 1200         6 Minute Walk   Phase Initial     Distance 850 feet     Walk Time 6 minutes     # of  Rest Breaks 0     MPH 1.6     METS 2.19     RPE 9     Perceived Dyspnea  0     VO2 Peak 7.68     Symptoms Yes (comment)     Comments arthritic foot pain 4/10     Resting HR 69 bpm     Resting BP 136/74     Resting Oxygen Saturation  98 %     Exercise Oxygen Saturation  during 6 min walk 97 %     Max Ex. HR 100 bpm     Max Ex. BP 142/70     2 Minute Post BP 130/80              Oxygen Initial Assessment:   Oxygen Re-Evaluation:   Oxygen Discharge (Final Oxygen Re-Evaluation):   Initial Exercise Prescription:  Initial Exercise Prescription - 10/17/21 1200       Date of Initial Exercise RX and Referring Provider   Date 10/17/21    Referring Provider Godfrey Pick Tobb      Oxygen   Maintain Oxygen Saturation 88% or higher      Recumbant Bike   Level 1    RPM 50    Minutes 15    METs 2.19      NuStep   Level 1    SPM 80    Minutes 15    METs 2.19      REL-XR   Level 1    Speed 50    Minutes 15    METs 2.19      Track   Laps 22    Minutes 15    METs 2.2      Prescription Details   Frequency (times per week) 2    Duration Progress to 30 minutes of continuous aerobic without signs/symptoms of physical distress      Intensity   THRR 40-80% of Max  Heartrate 100-132    Ratings of Perceived Exertion 11-13    Perceived Dyspnea 0-4      Progression   Progression Continue to progress workloads to maintain intensity without signs/symptoms of physical distress.      Resistance Training   Training Prescription Yes    Weight 2    Reps 10-15             Perform Capillary Blood Glucose checks as needed.  Exercise Prescription Changes:   Exercise Prescription Changes     Row Name 10/17/21 1200 11/07/21 1500 11/09/21 1600 11/20/21 1400 12/04/21 1500     Response to Exercise   Blood Pressure (Admit) 136/74 120/62 -- 116/60 122/58   Blood Pressure (Exercise) 142/70 122/82 -- 134/62 132/60   Blood Pressure (Exit) 130/80 118/58 -- 122/70 118/60   Heart Rate (Admit) 69 bpm 70 bpm -- 100 bpm 80 bpm   Heart Rate (Exercise) 100 bpm 118 bpm -- 112 bpm 105 bpm   Heart Rate (Exit) 74 bpm 89 bpm -- 92 bpm 92 bpm   Oxygen Saturation (Admit) 98 % -- -- -- --   Oxygen Saturation (Exercise) 97 % -- -- -- --   Oxygen Saturation (Exit) 98 % -- -- -- --   Rating of Perceived Exertion (Exercise) 9 12 -- 14 12   Perceived Dyspnea (Exercise) 0 -- -- -- --   Symptoms foot pain (4/10) foot pain -- foot pain none   Comments 6 MWT results -- -- -- --   Duration -- Progress to 30  minutes of  aerobic without signs/symptoms of physical distress -- Continue with 30 min of aerobic exercise without signs/symptoms of physical distress. Continue with 30 min of aerobic exercise without signs/symptoms of physical distress.   Intensity -- THRR unchanged -- THRR unchanged THRR unchanged     Progression   Progression -- Continue to progress workloads to maintain intensity without signs/symptoms of physical distress. -- Continue to progress workloads to maintain intensity without signs/symptoms of physical distress. Continue to progress workloads to maintain intensity without signs/symptoms of physical distress.   Average METs -- 2.06 -- 2.62 2.77     Resistance  Training   Training Prescription -- Yes -- Yes Yes   Weight -- 3 lb -- 3 lb 3 lb   Reps -- 10-15 -- 10-15 10-15     Interval Training   Interval Training -- No -- No No     Recumbant Bike   Level -- -- -- -- 1.5   Minutes -- -- -- -- 15   METs -- -- -- -- 3.09     NuStep   Level -- 4 -- 4 3   Minutes -- 15 -- 15 15   METs -- 2.3 -- 2.9 2.4     REL-XR   Level -- 1 -- 3 3   Minutes -- 15 -- 15 15   METs -- -- -- 3.2 3.3     Track   Laps -- 15 -- 14 24   Minutes -- 15 -- 15 15   METs -- 1.82 -- 1.76 2.31     Home Exercise Plan   Plans to continue exercise at -- -- Home (comment)  walking, gym, staff videos Home (comment)  walking, gym, staff videos Home (comment)  walking, gym, staff videos   Frequency -- -- Add 2 additional days to program exercise sessions. Add 2 additional days to program exercise sessions. Add 2 additional days to program exercise sessions.   Initial Home Exercises Provided -- -- 11/09/21 11/09/21 11/09/21     Oxygen   Maintain Oxygen Saturation -- 88% or higher -- 88% or higher 88% or higher            Exercise Comments:   Exercise Comments     Row Name 10/24/21 0956           Exercise Comments First full day of exercise!  Patient was oriented to gym and equipment including functions, settings, policies, and procedures.  Patient's individual exercise prescription and treatment plan were reviewed.  All starting workloads were established based on the results of the 6 minute walk test done at initial orientation visit.  The plan for exercise progression was also introduced and progression will be customized based on patient's performance and goals.                Exercise Goals and Review:   Exercise Goals     Row Name 10/17/21 1206             Exercise Goals   Increase Physical Activity Yes       Intervention Provide advice, education, support and counseling about physical activity/exercise needs.;Develop an individualized  exercise prescription for aerobic and resistive training based on initial evaluation findings, risk stratification, comorbidities and participant's personal goals.       Expected Outcomes Short Term: Attend rehab on a regular basis to increase amount of physical activity.;Long Term: Add in home exercise to make exercise part of routine and to increase amount of  physical activity.;Long Term: Exercising regularly at least 3-5 days a week.       Increase Strength and Stamina Yes       Intervention Provide advice, education, support and counseling about physical activity/exercise needs.;Develop an individualized exercise prescription for aerobic and resistive training based on initial evaluation findings, risk stratification, comorbidities and participant's personal goals.       Expected Outcomes Short Term: Increase workloads from initial exercise prescription for resistance, speed, and METs.;Short Term: Perform resistance training exercises routinely during rehab and add in resistance training at home;Long Term: Improve cardiorespiratory fitness, muscular endurance and strength as measured by increased METs and functional capacity (6MWT)       Able to understand and use rate of perceived exertion (RPE) scale Yes       Intervention Provide education and explanation on how to use RPE scale       Expected Outcomes Short Term: Able to use RPE daily in rehab to express subjective intensity level;Long Term:  Able to use RPE to guide intensity level when exercising independently       Able to understand and use Dyspnea scale Yes       Intervention Provide education and explanation on how to use Dyspnea scale       Expected Outcomes Short Term: Able to use Dyspnea scale daily in rehab to express subjective sense of shortness of breath during exertion;Long Term: Able to use Dyspnea scale to guide intensity level when exercising independently       Knowledge and understanding of Target Heart Rate Range (THRR) Yes        Intervention Provide education and explanation of THRR including how the numbers were predicted and where they are located for reference       Expected Outcomes Short Term: Able to state/look up THRR;Long Term: Able to use THRR to govern intensity when exercising independently;Short Term: Able to use daily as guideline for intensity in rehab       Able to check pulse independently Yes       Intervention Provide education and demonstration on how to check pulse in carotid and radial arteries.;Review the importance of being able to check your own pulse for safety during independent exercise       Expected Outcomes Short Term: Able to explain why pulse checking is important during independent exercise;Long Term: Able to check pulse independently and accurately       Understanding of Exercise Prescription Yes       Intervention Provide education, explanation, and written materials on patient's individual exercise prescription       Expected Outcomes Short Term: Able to explain program exercise prescription;Long Term: Able to explain home exercise prescription to exercise independently                Exercise Goals Re-Evaluation :  Exercise Goals Re-Evaluation     Row Name 10/24/21 0956 11/07/21 1520 11/09/21 1614 11/16/21 1533 11/20/21 1436     Exercise Goal Re-Evaluation   Exercise Goals Review Increase Physical Activity;Able to understand and use rate of perceived exertion (RPE) scale;Knowledge and understanding of Target Heart Rate Range (THRR);Understanding of Exercise Prescription;Able to understand and use Dyspnea scale;Able to check pulse independently;Increase Strength and Stamina Increase Physical Activity;Increase Strength and Stamina;Understanding of Exercise Prescription Increase Physical Activity;Increase Strength and Stamina;Understanding of Exercise Prescription;Able to understand and use rate of perceived exertion (RPE) scale;Able to understand and use Dyspnea scale;Able to check  pulse independently;Knowledge and understanding of Target Heart Rate  Range (THRR) Increase Physical Activity;Increase Strength and Stamina;Understanding of Exercise Prescription Increase Physical Activity;Increase Strength and Stamina;Understanding of Exercise Prescription   Comments Reviewed RPE and dyspnea scales, THR and program prescription with pt today.  Pt voiced understanding and was given a copy of goals to take home. Veronica Romero is off to a good start in rehab.  She has found shoes that she is tolerating, but walking still hurts her feet.  She is trying to do one walking station and one seated each day.  She has surprised herself at how well she is doing.  She is already going on the seated equipment without stopping and only rest on track to give her feet a break. We will continue to monitor her progress. Reviewed home exercise with pt today.  Pt plans to walk and go up to gym in her apartment complex at home for exercise.  We also talked about using our staff videos at home so that she doesn't have to leave her husband.  Reviewed THR, pulse, RPE, sign and symptoms, pulse oximetery and when to call 911 or MD.  Also discussed weather considerations and indoor options.  Pt voiced understanding. Veronica Romero feels like she is doing well in the program. She states that she has noticed an increase in her endurance and energy since coming to rehab. Patient says she still can not leave her husband so finding time to exercise at home can be difficult. She does plan on checking out the gym at her apartment complex. Veronica Romero is doing well in rehab.  She is up to level 4 on the NuStep.  Her feet are doing better than she thought they would in rehab.  She does walk some depending on how much her feet can tolerate.  She is still using 3 lb hand weights due to her grip.  We will continue to monitor her progress.   Expected Outcomes Short: Use RPE daily to regulate intensity. Long: Follow program prescription in THR. Short:  Continue to attend rehab regularly Long: Continue to follow program prescription Short: Check out gym at complex and try staff videos Long: Continue to exercise independently Short: Check out gym at apartment complex. Long: Continue to exercise on days away from rehab. Short: Continue to walk at least once a week Long: Continue to improve stamina    Row Name 12/04/21 1554             Exercise Goal Re-Evaluation   Exercise Goals Review Increase Physical Activity;Increase Strength and Stamina;Understanding of Exercise Prescription       Comments Veronica Romero is doing well in rehab. She reached a max of 24 laps on the track! She hits her THR most sessions. RPEs are staying in good range. We will continue to monitor.       Expected Outcomes Short: Continue to work up laps on track Long: Continue to increase overall MET level                Discharge Exercise Prescription (Final Exercise Prescription Changes):  Exercise Prescription Changes - 12/04/21 1500       Response to Exercise   Blood Pressure (Admit) 122/58    Blood Pressure (Exercise) 132/60    Blood Pressure (Exit) 118/60    Heart Rate (Admit) 80 bpm    Heart Rate (Exercise) 105 bpm    Heart Rate (Exit) 92 bpm    Rating of Perceived Exertion (Exercise) 12    Symptoms none    Duration Continue with 30 min of  aerobic exercise without signs/symptoms of physical distress.    Intensity THRR unchanged      Progression   Progression Continue to progress workloads to maintain intensity without signs/symptoms of physical distress.    Average METs 2.77      Resistance Training   Training Prescription Yes    Weight 3 lb    Reps 10-15      Interval Training   Interval Training No      Recumbant Bike   Level 1.5    Minutes 15    METs 3.09      NuStep   Level 3    Minutes 15    METs 2.4      REL-XR   Level 3    Minutes 15    METs 3.3      Track   Laps 24    Minutes 15    METs 2.31      Home Exercise Plan   Plans to  continue exercise at Home (comment)   walking, gym, staff videos   Frequency Add 2 additional days to program exercise sessions.    Initial Home Exercises Provided 11/09/21      Oxygen   Maintain Oxygen Saturation 88% or higher             Nutrition:  Target Goals: Understanding of nutrition guidelines, daily intake of sodium '1500mg'$ , cholesterol '200mg'$ , calories 30% from fat and 7% or less from saturated fats, daily to have 5 or more servings of fruits and vegetables.  Education: All About Nutrition: -Group instruction provided by verbal, written material, interactive activities, discussions, models, and posters to present general guidelines for heart healthy nutrition including fat, fiber, MyPlate, the role of sodium in heart healthy nutrition, utilization of the nutrition label, and utilization of this knowledge for meal planning. Follow up email sent as well. Written material given at graduation. Flowsheet Row Cardiac Rehab from 10/17/2021 in [facility] Cardiac and Pulmonary Rehab  Education need identified 10/17/21       Biometrics:  Pre Biometrics - 10/17/21 1207       Pre Biometrics   Height $Remov'5\' 6"'UrmdMI$  (1.676 m)    Weight 156 lb 6.4 oz (70.9 kg)    BMI (Calculated) 25.26    Single Leg Stand 4.53 seconds              Nutrition Therapy Plan and Nutrition Goals:  Nutrition Therapy & Goals - 11/29/21 0932       Nutrition Therapy   Diet Heart healthy, low Na    Drug/Food Interactions Statins/Certain Fruits    Protein (specify units) 85-95g    Fiber 25 grams    Whole Grain Foods 3 servings    Saturated Fats 12 max. grams    Fruits and Vegetables 8 servings/day    Sodium 2 grams      Personal Nutrition Goals   Nutrition Goal ST: include snacks in-between meals using examples provided and protien, fat, CHO structure LT: meet nutritional needs (calories and protein) while choosing heart healthy foods most often    Comments 71 y.o. F admitted to cardiac rehab s/p NSTEMI.  PMHx includes "Moderate Malnutrition related to acute illness (PNA) as evidenced by moderate muscle depletion, mild fat depletion, moderate fat depletion." noted 06/12/21, rheumatoid arthritis, CAD, HFrEF, pulmonary HTN, HLD. Relevant medications includes vit D3, leucovorin, MVI, furosemide, K+, methotrexate, prednisone, crestor, tramadol. PYP Score: 71. Vegetables & Fruits 6/12. Breads, Grains & Cereals 7/12. Red & Processed Meat 10/12. Poultry 2/2.  Fish & Shellfish 3/4. Beans, Nuts & Seeds 1/4. Milk & Dairy Foods 5/6. Toppings, Oils, Seasonings & Salt 16/20. Sweets, Snacks & Restaurant Food 11/14. Beverages 10/10. Veronica Romero reports eating this way for a while and reports no barriers aside from flavor building. B: Toast and sometimes an egg L: leftovers or fruit with yogurt - she reports not eating much D: she reports she will eat dinner most days: she reports not using lots of vegetables like she used to. She limits sweets for the most part. Drinks: iced coffee in morning, red bull (she reports feeling tired), 1-2 glasses of wine, water. She reports difficulty sleeping and she will feel fatigued a lot of the day, she reports that exercise is helping. She reports having a lower appetite recently - she has enjoyed cooking and eating, but since her husband health has declined this past year, her appetite has been worse. She has tried protein drinks before, but she does not like them. When she was in the hospital in February her weight was 75.4kg and she is now 70.9kg; when she was in the hospital at that time she was diagnosed with moderate malnutrition as evidenced by moderate muscle depletion, mild fat depletion, moderate fat depletion. Encouraged mechanical eating, variety in foods, snacks in-between meals with protein, fat, and CHO, choosing heart healthy foods when possible while trying to meet calorie and protein needs.      Intervention Plan   Intervention Prescribe, educate and counsel regarding individualized  specific dietary modifications aiming towards targeted core components such as weight, hypertension, lipid management, diabetes, heart failure and other comorbidities.    Expected Outcomes Short Term Goal: Understand basic principles of dietary content, such as calories, fat, sodium, cholesterol and nutrients.;Short Term Goal: A plan has been developed with personal nutrition goals set during dietitian appointment.;Long Term Goal: Adherence to prescribed nutrition plan.             Nutrition Assessments:  MEDIFICTS Score Key: ?70 Need to make dietary changes  40-70 Heart Healthy Diet ? 40 Therapeutic Level Cholesterol Diet  Flowsheet Row Cardiac Rehab from 10/17/2021 in [facility] Cardiac and Pulmonary Rehab  Picture Your Plate Total Score on Admission 71      Picture Your Plate Scores: <04 Unhealthy dietary pattern with much room for improvement. 41-50 Dietary pattern unlikely to meet recommendations for good health and room for improvement. 51-60 More healthful dietary pattern, with some room for improvement.  >60 Healthy dietary pattern, although there may be some specific behaviors that could be improved.    Nutrition Goals Re-Evaluation:   Nutrition Goals Discharge (Final Nutrition Goals Re-Evaluation):   Psychosocial: Target Goals: Acknowledge presence or absence of significant depression and/or stress, maximize coping skills, provide positive support system. Participant is able to verbalize types and ability to use techniques and skills needed for reducing stress and depression.   Education: Stress, Anxiety, and Depression - Group verbal and visual presentation to define topics covered.  Reviews how body is impacted by stress, anxiety, and depression.  Also discusses healthy ways to reduce stress and to treat/manage anxiety and depression.  Written material given at graduation.   Education: Sleep Hygiene -Provides group verbal and written instruction about how sleep can  affect your health.  Define sleep hygiene, discuss sleep cycles and impact of sleep habits. Review good sleep hygiene tips.    Initial Review & Psychosocial Screening:  Initial Psych Review & Screening - 10/06/21 1319       Initial Review  Current issues with Current Stress Concerns    Source of Stress Concerns Family    Comments Stress with husband needing total care.  Does have helpers every day so she can do what she needs to do.      Family Dynamics   Good Support System? Yes   daughter 2 grandkids     Barriers   Psychosocial barriers to participate in program The patient should benefit from training in stress management and relaxation.      Screening Interventions   Interventions Encouraged to exercise;To provide support and resources with identified psychosocial needs;Provide feedback about the scores to participant    Expected Outcomes Short Term goal: Utilizing psychosocial counselor, staff and physician to assist with identification of specific Stressors or current issues interfering with healing process. Setting desired goal for each stressor or current issue identified.;Long Term Goal: Stressors or current issues are controlled or eliminated.;Short Term goal: Identification and review with participant of any Quality of Life or Depression concerns found by scoring the questionnaire.;Long Term goal: The participant improves quality of Life and PHQ9 Scores as seen by post scores and/or verbalization of changes             Quality of Life Scores:   Quality of Life - 10/17/21 1209       Quality of Life   Select Quality of Life      Quality of Life Scores   Health/Function Pre 11.36 %    Socioeconomic Pre 13.13 %    Psych/Spiritual Pre 10.92 %    Family Pre 20.63 %    GLOBAL Pre 12.84 %            Scores of 19 and below usually indicate a poorer quality of life in these areas.  A difference of  2-3 points is a clinically meaningful difference.  A difference of 2-3  points in the total score of the Quality of Life Index has been associated with significant improvement in overall quality of life, self-image, physical symptoms, and general health in studies assessing change in quality of life.  PHQ-9: Review Flowsheet       11/23/2021 10/19/2021 10/17/2021 09/08/2021  Depression screen PHQ 2/9  Decreased Interest 1 2 2 1   Down, Depressed, Hopeless 1 1 2 2   PHQ - 2 Score 2 3 4 3   Altered sleeping 3 2 3 2   Tired, decreased energy 1 1 3 1   Change in appetite 1 1 1 1   Feeling bad or failure about yourself  1 1 1 1   Trouble concentrating 1 1 1  0  Moving slowly or fidgety/restless 0 0 0 0  Suicidal thoughts 0 0 1 0  PHQ-9 Score 9 9 14 8   Difficult doing work/chores Somewhat difficult Somewhat difficult Somewhat difficult Not difficult at all   Interpretation of Total Score  Total Score Depression Severity:  1-4 = Minimal depression, 5-9 = Mild depression, 10-14 = Moderate depression, 15-19 = Moderately severe depression, 20-27 = Severe depression   Psychosocial Evaluation and Intervention:  Psychosocial Evaluation - 10/06/21 1326       Psychosocial Evaluation & Interventions   Interventions Encouraged to exercise with the program and follow exercise prescription    Comments Veronica Romero has no barriers to attending the program. She does have stress at home in that her husband requires fulltime care. They do have help for his care so she can have time to take care of herself. She has a daughter and two grandkids that live in  [place] as her support. She is ready to get started with the program in hope that she will gain more strentgh and stamina to be able not to tire out every afternoon.    Expected Outcomes STG Veronica Romero will attend all scheduled sessions, she will have time to work on her health and stamina. LTG Veronica Romero will be able to continue to care for herself maintaining the program she started here at her home    Continue Psychosocial Services  Follow up  required by staff             Psychosocial Re-Evaluation:  Psychosocial Re-Evaluation     [place] Name 11/16/21 1543             Psychosocial Re-Evaluation   Current issues with Current Stress Concerns;Current Sleep Concerns       Comments Patient states that she does have some concerns with stress related to being her husband's caregiver. She also states that she has had some trouble falling asleep and staying asleep at night. She states that reading is her avenue for stress relief. She reports that rehab has been good for her because it helps her to feel better.       Expected Outcomes Short: Continue to read and attend exercise for stress relief. Log: Maintain a positive outlook.       Interventions Encouraged to attend Cardiac Rehabilitation for the exercise       Continue Psychosocial Services  Follow up required by staff         Initial Review   Source of Stress Concerns Family       Comments Stress with husband needing total care.  Does have helpers every day so she can do what she needs to do.                Psychosocial Discharge (Final Psychosocial Re-Evaluation):  Psychosocial Re-Evaluation - 11/16/21 1543       Psychosocial Re-Evaluation   Current issues with Current Stress Concerns;Current Sleep Concerns    Comments Patient states that she does have some concerns with stress related to being her husband's caregiver. She also states that she has had some trouble falling asleep and staying asleep at night. She states that reading is her avenue for stress relief. She reports that rehab has been good for her because it helps her to feel better.    Expected Outcomes Short: Continue to read and attend exercise for stress relief. Log: Maintain a positive outlook.    Interventions Encouraged to attend Cardiac Rehabilitation for the exercise    Continue Psychosocial Services  Follow up required by staff      Initial Review   Source of Stress Concerns Family    Comments  Stress with husband needing total care.  Does have helpers every day so she can do what she needs to do.             Vocational Rehabilitation: Provide vocational rehab assistance to qualifying candidates.   Vocational Rehab Evaluation & Intervention:   Education: Education Goals: Education classes will be provided on a variety of topics geared toward better understanding of heart health and risk factor modification. Participant will state understanding/return demonstration of topics presented as noted by education test scores.  Learning Barriers/Preferences:   General Cardiac Education Topics:  AED/CPR: - Group verbal and written instruction with the use of models to demonstrate the basic use of the AED with the basic ABC's of resuscitation.   Anatomy and Cardiac Procedures: -  Group verbal and visual presentation and models provide information about basic cardiac anatomy and function. Reviews the testing methods done to diagnose heart disease and the outcomes of the test results. Describes the treatment choices: Medical Management, Angioplasty, or Coronary Bypass Surgery for treating various heart conditions including Myocardial Infarction, Angina, Valve Disease, and Cardiac Arrhythmias.  Written material given at graduation.   Medication Safety: - Group verbal and visual instruction to review commonly prescribed medications for heart and lung disease. Reviews the medication, class of the drug, and side effects. Includes the steps to properly store meds and maintain the prescription regimen.  Written material given at graduation.   Intimacy: - Group verbal instruction through game format to discuss how heart and lung disease can affect sexual intimacy. Written material given at graduation..   Know Your Numbers and Heart Failure: - Group verbal and visual instruction to discuss disease risk factors for cardiac and pulmonary disease and treatment options.  Reviews associated  critical values for Overweight/Obesity, Hypertension, Cholesterol, and Diabetes.  Discusses basics of heart failure: signs/symptoms and treatments.  Introduces Heart Failure Zone chart for action plan for heart failure.  Written material given at graduation.   Infection Prevention: - Provides verbal and written material to individual with discussion of infection control including proper hand washing and proper equipment cleaning during exercise session. Flowsheet Row Cardiac Rehab from 10/17/2021 in [facility] Cardiac and Pulmonary Rehab  Date 10/17/21  Educator [facility]  Instruction Review Code 1- Verbalizes Understanding       Falls Prevention: - Provides verbal and written material to individual with discussion of falls prevention and safety. Flowsheet Row Cardiac Rehab from 10/17/2021 in [facility] Cardiac and Pulmonary Rehab  Date 10/17/21  Educator [facility]  Instruction Review Code 1- Verbalizes Understanding       Other: -Provides group and verbal instruction on various topics (see comments)   Knowledge Questionnaire Score:  Knowledge Questionnaire Score - 10/17/21 1210       Knowledge Questionnaire Score   Pre Score 24/26             Core Components/Risk Factors/Patient Goals at Admission:  Personal Goals and Risk Factors at Admission - 10/17/21 1208       Core Components/Risk Factors/Patient Goals on Admission    Weight Management Yes    Intervention Weight Management: Develop a combined nutrition and exercise program designed to reach desired caloric intake, while maintaining appropriate intake of nutrient and fiber, sodium and fats, and appropriate energy expenditure required for the weight goal.;Weight Management: Provide education and appropriate resources to help participant work on and attain dietary goals.    Admit Weight 156 lb 6.4 oz (70.9 kg)    Goal Weight: Short Term 150 lb (68 kg)    Goal Weight: Long Term 150 lb (68 kg)    Expected Outcomes Short Term: Continue to  assess and modify interventions until short term weight is achieved;Long Term: Adherence to nutrition and physical activity/exercise program aimed toward attainment of established weight goal;Weight Maintenance: Understanding of the daily nutrition guidelines, which includes 25-35% calories from fat, 7% or less cal from saturated fats, less than $RemoveB'200mg'xqMMXgjV$  cholesterol, less than 1.5gm of sodium, & 5 or more servings of fruits and vegetables daily;Understanding recommendations for meals to include 15-35% energy as protein, 25-35% energy from fat, 35-60% energy from carbohydrates, less than $RemoveB'200mg'eNjAhFKy$  of dietary cholesterol, 20-35 gm of total fiber daily;Understanding of distribution of calorie intake throughout the day with the consumption of 4-5 meals/snacks  Heart Failure Yes    Intervention Provide a combined exercise and nutrition program that is supplemented with education, support and counseling about heart failure. Directed toward relieving symptoms such as shortness of breath, decreased exercise tolerance, and extremity edema.    Expected Outcomes Improve functional capacity of life;Short term: Attendance in program 2-3 days a week with increased exercise capacity. Reported lower sodium intake. Reported increased fruit and vegetable intake. Reports medication compliance.;Short term: Daily weights obtained and reported for increase. Utilizing diuretic protocols set by physician.;Long term: Adoption of self-care skills and reduction of barriers for early signs and symptoms recognition and intervention leading to self-care maintenance.    Lipids Yes    Intervention Provide education and support for participant on nutrition & aerobic/resistive exercise along with prescribed medications to achieve LDL '70mg'$ , HDL >$Remo'40mg'HAgOp$ .    Expected Outcomes Short Term: Participant states understanding of desired cholesterol values and is compliant with medications prescribed. Participant is following exercise prescription and  nutrition guidelines.;Long Term: Cholesterol controlled with medications as prescribed, with individualized exercise RX and with personalized nutrition plan. Value goals: LDL < $Rem'70mg'laVX$ , HDL > 40 mg.             Education:Diabetes - Individual verbal and written instruction to review signs/symptoms of diabetes, desired ranges of glucose level fasting, after meals and with exercise. Acknowledge that pre and post exercise glucose checks will be done for 3 sessions at entry of program.   Core Components/Risk Factors/Patient Goals Review:   Goals and Risk Factor Review     Row Name 11/16/21 1549             Core Components/Risk Factors/Patient Goals Review   Personal Goals Review Hypertension;Lipids;Weight Management/Obesity       Review Patient does have a BP cuff at home, but has not been checking her BP at home on a regular basis. She was informed that this would be a good way to make sure she is within her normal range. She is happy with her current weight but wouldn't mind losing a few pounds. We spoke about how exercising more regularly at home in addition to rehab could help with weight loss.       Expected Outcomes Short: begin checking BP at home. Long: continue to attend rehab and exercise at home for weight loss.                Core Components/Risk Factors/Patient Goals at Discharge (Final Review):   Goals and Risk Factor Review - 11/16/21 1549       Core Components/Risk Factors/Patient Goals Review   Personal Goals Review Hypertension;Lipids;Weight Management/Obesity    Review Patient does have a BP cuff at home, but has not been checking her BP at home on a regular basis. She was informed that this would be a good way to make sure she is within her normal range. She is happy with her current weight but wouldn't mind losing a few pounds. We spoke about how exercising more regularly at home in addition to rehab could help with weight loss.    Expected Outcomes Short: begin  checking BP at home. Long: continue to attend rehab and exercise at home for weight loss.             ITP Comments:  ITP Comments     Row Name 10/06/21 1331 10/17/21 1159 10/24/21 0956 11/08/21 0955 11/29/21 1148   ITP Comments Virtual orientation call completed today. shehas an appointment on Date: 10/17/2021  for EP  eval and gym Orientation.  Documentation of diagnosis can be found in [facility] Date: 07/17/2021 . Completed 6MWT and gym orientation. Initial ITP created and sent for review to Dr. Emily Filbert, Medical Director. First full day of exercise!  Patient was oriented to gym and equipment including functions, settings, policies, and procedures.  Patient's individual exercise prescription and treatment plan were reviewed.  All starting workloads were established based on the results of the 6 minute walk test done at initial orientation visit.  The plan for exercise progression was also introduced and progression will be customized based on patient's performance and goals. 30 Day review completed. Medical Director ITP review done, changes made as directed, and signed approval by Medical Director. Completed initial RD consultation    [place] Name 12/06/21 0755           ITP Comments 30 Day review completed. Medical Director ITP review done, changes made as directed, and signed approval by Medical Director.                Comments:

## 2021-12-07 DIAGNOSIS — Z955 Presence of coronary angioplasty implant and graft: Secondary | ICD-10-CM | POA: Diagnosis not present

## 2021-12-07 DIAGNOSIS — I214 Non-ST elevation (NSTEMI) myocardial infarction: Secondary | ICD-10-CM

## 2021-12-07 NOTE — Progress Notes (Signed)
Daily Session Note  Patient Details  Name: Veronica Romero MRN: [id number] Date of Birth: 1950/10/05 Referring Provider:   Flowsheet Row Cardiac Rehab from 10/17/2021 in [facility] Cardiac and Pulmonary Rehab  Referring Provider Berniece Salines       Encounter Date: 12/07/2021  Check In:  Session Check In - 12/07/21 1546       Check-In   Supervising physician immediately available to respond to emergencies See telemetry face sheet for immediately available ER MD    Location [facility]-Cardiac & Pulmonary Rehab    Staff Present Vida Rigger, RN, BSN;Jessica Murrieta, MA, RCEP, CCRP, Marylynn Pearson, MS, ASCM CEP, Exercise Physiologist    Virtual Visit No    Medication changes reported     No    Fall or balance concerns reported    No    Tobacco Cessation No Change    Warm-up and Cool-down Performed on first and last piece of equipment    Resistance Training Performed Yes    VAD Patient? No    PAD/SET Patient? No      Pain Assessment   Currently in Pain? No/denies                Social History   Tobacco Use  Smoking Status Never  Smokeless Tobacco Never    Goals Met:  Independence with exercise equipment Exercise tolerated well No report of concerns or symptoms today Strength training completed today  Goals Unmet:  Not Applicable  Comments: Pt able to follow exercise prescription today without complaint.  Will continue to monitor for progression.   Dr. Emily Filbert is Medical Director for [place].  Dr. Ottie Glazier is Medical Director for [facility] Pulmonary Rehabilitation.

## 2021-12-10 ENCOUNTER — Other Ambulatory Visit: Payer: Self-pay | Admitting: Cardiology

## 2021-12-11 ENCOUNTER — Encounter: Payer: Self-pay | Admitting: Cardiology

## 2021-12-11 ENCOUNTER — Other Ambulatory Visit (HOSPITAL_COMMUNITY): Payer: Self-pay

## 2021-12-11 ENCOUNTER — Ambulatory Visit (INDEPENDENT_AMBULATORY_CARE_PROVIDER_SITE_OTHER): Payer: Medicare Other | Admitting: Cardiology

## 2021-12-11 VITALS — BP 134/54 | HR 69 | Ht 66.0 in | Wt 158.2 lb

## 2021-12-11 DIAGNOSIS — I420 Dilated cardiomyopathy: Secondary | ICD-10-CM | POA: Diagnosis not present

## 2021-12-11 DIAGNOSIS — E782 Mixed hyperlipidemia: Secondary | ICD-10-CM

## 2021-12-11 DIAGNOSIS — I110 Hypertensive heart disease with heart failure: Secondary | ICD-10-CM | POA: Diagnosis not present

## 2021-12-11 DIAGNOSIS — I272 Pulmonary hypertension, unspecified: Secondary | ICD-10-CM

## 2021-12-11 DIAGNOSIS — I2510 Atherosclerotic heart disease of native coronary artery without angina pectoris: Secondary | ICD-10-CM | POA: Diagnosis not present

## 2021-12-11 MED ORDER — ENTRESTO 24-26 MG PO TABS: 1.0000 | ORAL_TABLET | Freq: Two times a day (BID) | ORAL | 2 refills | Status: DC

## 2021-12-11 MED ORDER — POTASSIUM CHLORIDE CRYS ER 20 MEQ PO TBCR
20.0000 meq | EXTENDED_RELEASE_TABLET | Freq: Every day | ORAL | 3 refills | Status: DC
Start: 2021-12-11 — End: 2022-03-01
  Filled 2021-12-11: qty 90, 90d supply, fill #0

## 2021-12-11 NOTE — Patient Instructions (Signed)
Medication Instructions:  Your physician has recommended you make the following change in your medication: STOP: Losartan  START: Entresto 24-25mg  TWICE DAILY.  *If you need a refill on your cardiac medications before your next appointment, please call your pharmacy*   Lab Work: NONE If you have labs (blood work) drawn today and your tests are completely normal, you will receive your results only by: MyChart Message (if you have MyChart) OR A paper copy in the mail If you have any lab test that is abnormal or we need to change your treatment, we will call you to review the results.   Testing/Procedures: Your physician has requested that you have an echocardiogram. Echocardiography is a painless test that uses sound waves to create images of your heart. It provides your doctor with information about the size and shape of your heart and how well your heart's chambers and valves are working. This procedure takes approximately one hour. There are no restrictions for this procedure.    Follow-Up: At [facility], you and your health needs are our priority.  As part of our continuing mission to provide you with exceptional heart care, we have created designated Provider Care Teams.  These Care Teams include your primary Cardiologist (physician) and Advanced Practice Providers (APPs -  Physician Assistants and Nurse Practitioners) who all work together to provide you with the care you need, when you need it.  We recommend signing up for the patient portal called "MyChart".  Sign up information is provided on this After Visit Summary.  MyChart is used to connect with patients for Virtual Visits (Telemedicine).  Patients are able to view lab/test results, encounter notes, upcoming appointments, etc.  Non-urgent messages can be sent to your provider as well.   To learn more about what you can do with MyChart, go to [email or web address].    Your next appointment:   4-6 month(s)  The format for  your next appointment:   In Person  Provider:   Thomasene Ripple, DO

## 2021-12-11 NOTE — Progress Notes (Unsigned)
Cardiology Office Note:    Date:  12/13/2021   ID:  Veronica Romero, DOB 03-24-1951, MRN [id number]  PCP:  Jon Billings, NP  Cardiologist:  Berniece Salines, DO  Electrophysiologist:  None   Referring MD: Jon Billings, NP   " I am doing much better with the physical therapy."  History of Present Illness:    Veronica Romero is a 71 y.o. female with a hx of coronary artery disease with severe left anterior descending artery stenosis status post PCI to the LAD, mild pulmonary hypertension, rheumatoid arthritis on methotrexate prednisone and Humira, recent COVID-19 infection with associated acute hypoxic respiratory failure, community-acquired pneumonia and newly diagnosed heart failure with reduced ejection fraction/ischemic cardiomyopathy EF 25 to 30% with wall motion abnormalities.   I saw the patient on 05/15/2021 posthospitalization.  During that visit she was ending her PCI of the LAD.  At that time I continued the patient on her Coreg, losartan, spironolactone as well as her [place].  Since her last visit she has not been able to take her Wilder Glade because of insurance coverage.   I saw the patient on August 11, 2021 at that time she had not been taking her Wilder Glade due to insurance therefore stopped the [place] and started the patient on Jardiance, continue the carvedilol 3.125 milligrams twice daily, losartan 25 mg daily and Aldactone.  I will place an echo in for the patient.  She was able to get her echo done which was on July 11.  She offers no complaints today.  Past Medical History:  Diagnosis Date   Ischemic cardiomyopathy 06/12/2021   EF 25-30%   Rheumatoid arthritis [facility])     Past Surgical History:  Procedure Laterality Date   CORONARY ATHERECTOMY N/A 07/17/2021   Procedure: CORONARY ATHERECTOMY;  Surgeon: Martinique, Peter M, MD;  Location: [place] [facility];  Service: Cardiovascular;  Laterality: N/A;   RIGHT/LEFT HEART CATH AND CORONARY ANGIOGRAPHY N/A 06/16/2021    Procedure: RIGHT/LEFT HEART CATH AND CORONARY ANGIOGRAPHY;  Surgeon: Belva Crome, MD;  Location: [place] [facility];  Service: Cardiovascular;  Laterality: N/A;    Current Medications: Current Meds  Medication Sig   Adalimumab (HUMIRA PEN) 40 MG/0.4ML PNKT Inject 0.4 mLs into the skin every Tuesday. Tuesday   aspirin 81 MG EC tablet Take 1 tablet (81 mg total) by mouth daily. Swallow whole.   carvedilol (COREG) 3.125 MG tablet Take 1 tablet (3.125 mg total) by mouth 2 (two) times daily with a meal.   Cholecalciferol (VITAMIN D3 PO) Take 1 tablet by mouth daily.   clopidogrel (PLAVIX) 75 MG tablet Take 1 tablet (75 mg total) by mouth daily.   furosemide (LASIX) 40 MG tablet Take 1 tablet (40 mg total) by mouth every other day.   leucovorin (WELLCOVORIN) 5 MG tablet Take 15 mg by mouth every Saturday.   methotrexate 50 MG/2ML injection Inject 1 mL (25 mg total) into the muscle every Saturday. Hold methotrexate for 2 more weeks and then resume 3/10   Multiple Vitamin (MULTIVITAMIN WITH MINERALS) TABS tablet Take 1 tablet by mouth daily.   nitroGLYCERIN (NITROSTAT) 0.4 MG SL tablet Place 1 tablet (0.4 mg total) under the tongue every 5 (five) minutes as needed.   Polyethyl Glycol-Propyl Glycol (LUBRICANT EYE DROPS) 0.4-0.3 % SOLN Place 1 drop into both eyes 3 (three) times daily as needed (dry/irritated eyes.).   predniSONE (DELTASONE) 5 MG tablet Take 10 mg by mouth in the morning.   rosuvastatin (CRESTOR) 40 MG  tablet Take 1 tablet (40 mg total) by mouth daily.   sacubitril-valsartan (ENTRESTO) 24-26 MG Take 1 tablet by mouth 2 (two) times daily.   spironolactone (ALDACTONE) 25 MG tablet Take 1/2 tablet (12.5 mg total) by mouth daily.   traMADol (ULTRAM) 50 MG tablet Take 50 mg by mouth every 6 (six) hours as needed (pain.).   [DISCONTINUED] losartan (COZAAR) 25 MG tablet Take 1/2 tablet (12.5 mg total) by mouth daily.   [DISCONTINUED] potassium chloride SA (KLOR-CON M) 20 MEQ tablet Take 1  tablet (20 mEq total) by mouth daily.     Allergies:   Penicillins   Social History   Socioeconomic History   Marital status: Married    Spouse name: Shanel Contreras   Number of children: 1   Years of education: Not on file   Highest education level: Some college, no degree  Occupational History   Occupation: retired  Tobacco Use   Smoking status: Never   Smokeless tobacco: Never  Vaping Use   Vaping Use: Never used  Substance and Sexual Activity   Alcohol use: Yes    Alcohol/week: 2.0 standard drinks of alcohol    Types: 2 Glasses of wine per week   Drug use: Never   Sexual activity: Not Currently  Other Topics Concern   Not on file  Social History Narrative   Not on file   Social Determinants of Health   Financial Resource Strain: Low Risk  (07/18/2021)   Overall Financial Resource Strain (CARDIA)    Difficulty of Paying Living Expenses: Not very hard  Food Insecurity: No Food Insecurity (10/19/2021)   Hunger Vital Sign    Worried About Running Out of Food in the Last Year: Never true    Ran Out of Food in the Last Year: Never true  Transportation Needs: No Transportation Needs (10/19/2021)   PRAPARE - Hydrologist (Medical): No    Lack of Transportation (Non-Medical): No  Physical Activity: Sufficiently Active (10/19/2021)   Exercise Vital Sign    Days of Exercise per Week: 3 days    Minutes of Exercise per Session: 60 min  Stress: Stress Concern Present (10/19/2021)   [place]    Feeling of Stress : To some extent  Social Connections: Moderately Isolated (10/19/2021)   Social Connection and Isolation Panel [NHANES]    Frequency of Communication with Friends and Family: More than three times a week    Frequency of Social Gatherings with Friends and Family: Once a week    Attends Religious Services: Never    Marine scientist or Organizations: No    Attends Programme researcher, broadcasting/film/video: Never    Marital Status: Married     Family History: The patient's family history includes Autoimmune disease in her daughter; Heart disease in her father; Kidney disease in her brother.  ROS:   Review of Systems  Constitution: Negative for decreased appetite, fever and weight gain.  HENT: Negative for congestion, ear discharge, hoarse voice and sore throat.   Eyes: Negative for discharge, redness, vision loss in right eye and visual halos.  Cardiovascular: Negative for chest pain, dyspnea on exertion, leg swelling, orthopnea and palpitations.  Respiratory: Negative for cough, hemoptysis, shortness of breath and snoring.   Endocrine: Negative for heat intolerance and polyphagia.  Hematologic/Lymphatic: Negative for bleeding problem. Does not bruise/bleed easily.  Skin: Negative for flushing, nail changes, rash and suspicious lesions.  Musculoskeletal: Negative  for arthritis, joint pain, muscle cramps, myalgias, neck pain and stiffness.  Gastrointestinal: Negative for abdominal pain, bowel incontinence, diarrhea and excessive appetite.  Genitourinary: Negative for decreased libido, genital sores and incomplete emptying.  Neurological: Negative for brief paralysis, focal weakness, headaches and loss of balance.  Psychiatric/Behavioral: Negative for altered mental status, depression and suicidal ideas.  Allergic/Immunologic: Negative for HIV exposure and persistent infections.    EKGs/Labs/Other Studies Reviewed:    The following studies were reviewed today:   EKG:  None   Echocardiogram November 07, 2021 FINDINGS:  Left Ventricle: Basal inferior and basal inferoseptal akinesis with  otherwise global hypokinesis. Left ventricular ejection fraction, by  estimation, is 40 to 45%. The left ventricle has mildly decreased  function. The left ventricle demonstrates global  hypokinesis. Definity contrast agent was given IV to delineate the left  ventricular endocardial  borders. The left ventricular internal cavity size  was normal in size. There is no left ventricular hypertrophy. Left  ventricular diastolic parameters are  consistent with Grade I diastolic dysfunction (impaired relaxation).  Indeterminate filling pressures.   Right Ventricle: The right ventricular size is normal. No increase in  right ventricular wall thickness. Right ventricular systolic function is  normal.   Left Atrium: Left atrial size was moderately dilated.   Right Atrium: Right atrial size was normal in size.   Pericardium: There is no evidence of pericardial effusion.   Mitral Valve: The mitral valve is normal in structure. Mild mitral annular  calcification. Trivial mitral valve regurgitation. No evidence of mitral  valve stenosis.   Tricuspid Valve: The tricuspid valve is normal in structure. Tricuspid  valve regurgitation is not demonstrated. No evidence of tricuspid  stenosis.   Aortic Valve: The aortic valve is normal in structure. There is mild  calcification of the aortic valve. There is mild thickening of the aortic  valve. Aortic valve regurgitation is moderate. Aortic regurgitation PHT  measures 293 msec. No aortic stenosis  is present.   Pulmonic Valve: The pulmonic valve was normal in structure. Pulmonic valve  regurgitation is not visualized. No evidence of pulmonic stenosis.   Aorta: Aortic dilatation noted. There is mild dilatation of the ascending  aorta, measuring 37 mm.   Venous: The inferior vena cava is normal in size with greater than 50%  respiratory variability, suggesting right atrial pressure of 3 mmHg.   IAS/Shunts: No atrial level shunt detected by color flow Doppler.   Recent Labs: 06/21/2021: B Natriuretic Peptide 134.5 07/13/2021: Magnesium 2.3 09/08/2021: ALT 18; BUN 11; Creatinine, Ser 0.89; Hemoglobin 9.0; Platelets 260; Potassium 4.3; Sodium 137  Recent Lipid Panel    Component Value Date/Time   CHOL 177 06/16/2021 0208    TRIG 115 06/16/2021 0208   HDL 38 (L) 06/16/2021 0208   CHOLHDL 4.7 06/16/2021 0208   VLDL 23 06/16/2021 0208   LDLCALC 116 (H) 06/16/2021 0208    Physical Exam:    VS:  BP (!) 134/54   Pulse 69   Ht 5\' 6"  (1.676 m)   Wt 158 lb 3.2 oz (71.8 kg)   SpO2 98%   BMI 25.53 kg/m     Wt Readings from Last 3 Encounters:  12/11/21 158 lb 3.2 oz (71.8 kg)  10/17/21 156 lb 6.4 oz (70.9 kg)  09/12/21 152 lb 3.2 oz (69 kg)     GEN: Well nourished, well developed in no acute distress HEENT: Normal NECK: No JVD; No carotid bruits LYMPHATICS: No lymphadenopathy CARDIAC: S1S2 noted,RRR, no murmurs, rubs,  gallops RESPIRATORY:  Clear to auscultation without rales, wheezing or rhonchi  ABDOMEN: Soft, non-tender, non-distended, +bowel sounds, no guarding. EXTREMITIES: No edema, No cyanosis, no clubbing MUSCULOSKELETAL:  No deformity  SKIN: Warm and dry NEUROLOGIC:  Alert and oriented x 3, non-focal PSYCHIATRIC:  Normal affect, good insight  ASSESSMENT:    1. Dilated cardiomyopathy (HCC)   2. Coronary artery disease involving native coronary artery of native heart without angina pectoris   3. Mild pulmonary hypertension (HCC)   4. Hypertensive heart disease with heart failure (HCC)   5. Mixed hyperlipidemia    PLAN:    We talked about her echocardiogram report which showed a slightly improved ejection fraction 40 to 45% compared to 25-30%. Stop losartan, start Entresto 24-25 mg twice daily.  Continue Coreg, Jardiance, and spironolactone.  She is very happy with the fact that she is progressing with her physical therapy.  No other complaints at this time.  We will see the patient in 6 months plan to do echocardiogram before her next visit to assess LVEF. The patient is in agreement with the above plan. The patient left the office in stable condition.  The patient will follow up in   Medication Adjustments/Labs and Tests Ordered: Current medicines are reviewed at length with the  patient today.  Concerns regarding medicines are outlined above.  Orders Placed This Encounter  Procedures   ECHOCARDIOGRAM COMPLETE   Meds ordered this encounter  Medications   sacubitril-valsartan (ENTRESTO) 24-26 MG    Sig: Take 1 tablet by mouth 2 (two) times daily.    Dispense:  180 tablet    Refill:  2    Patient Instructions  Medication Instructions:  Your physician has recommended you make the following change in your medication: STOP: Losartan  START: Entresto 24-25mg  TWICE DAILY.  *If you need a refill on your cardiac medications before your next appointment, please call your pharmacy*   Lab Work: NONE If you have labs (blood work) drawn today and your tests are completely normal, you will receive your results only by: MyChart Message (if you have MyChart) OR A paper copy in the mail If you have any lab test that is abnormal or we need to change your treatment, we will call you to review the results.   Testing/Procedures: Your physician has requested that you have an echocardiogram. Echocardiography is a painless test that uses sound waves to create images of your heart. It provides your doctor with information about the size and shape of your heart and how well your heart's chambers and valves are working. This procedure takes approximately one hour. There are no restrictions for this procedure.    Follow-Up: At [facility], you and your health needs are our priority.  As part of our continuing mission to provide you with exceptional heart care, we have created designated Provider Care Teams.  These Care Teams include your primary Cardiologist (physician) and Advanced Practice Providers (APPs -  Physician Assistants and Nurse Practitioners) who all work together to provide you with the care you need, when you need it.  We recommend signing up for the patient portal called "MyChart".  Sign up information is provided on this After Visit Summary.  MyChart is used to  connect with patients for Virtual Visits (Telemedicine).  Patients are able to view lab/test results, encounter notes, upcoming appointments, etc.  Non-urgent messages can be sent to your provider as well.   To learn more about what you can do with MyChart, go to  [email or web address].    Your next appointment:   4-6 month(s)  The format for your next appointment:   In Person  Provider:   Thomasene Ripple, DO     Adopting a Healthy Lifestyle.  Know what a healthy weight is for you (roughly BMI <25) and aim to maintain this   Aim for 7+ servings of fruits and vegetables daily   65-80+ fluid ounces of water or unsweet tea for healthy kidneys   Limit to max 1 drink of alcohol per day; avoid smoking/tobacco   Limit animal fats in diet for cholesterol and heart health - choose grass fed whenever available   Avoid highly processed foods, and foods high in saturated/trans fats   Aim for low stress - take time to unwind and care for your mental health   Aim for 150 min of moderate intensity exercise weekly for heart health, and weights twice weekly for bone health   Aim for 7-9 hours of sleep daily   When it comes to diets, agreement about the perfect plan isnt easy to find, even among the experts. Experts at the [facility] [employer] developed an idea known as the Healthy Eating Plate. Just imagine a plate divided into logical, healthy portions.   The emphasis is on diet quality:   Load up on vegetables and fruits - one-half of your plate: Aim for color and variety, and remember that potatoes dont count.   Go for whole grains - one-quarter of your plate: Whole wheat, barley, wheat berries, quinoa, oats, brown rice, and foods made with them. If you want pasta, go with whole wheat pasta.   Protein power - one-quarter of your plate: Fish, chicken, beans, and nuts are all healthy, versatile protein sources. Limit red meat.   The diet, however, does go beyond the plate,  offering a few other suggestions.   Use healthy plant oils, such as olive, canola, soy, corn, sunflower and peanut. Check the labels, and avoid partially hydrogenated oil, which have unhealthy trans fats.   If youre thirsty, drink water. Coffee and tea are good in moderation, but skip sugary drinks and limit milk and dairy products to one or two daily servings.   The type of carbohydrate in the diet is more important than the amount. Some sources of carbohydrates, such as vegetables, fruits, whole grains, and beans-are healthier than others.   Finally, stay active  Signed, Thomasene Ripple, DO  12/13/2021 9:20 PM    [facility]

## 2021-12-12 ENCOUNTER — Encounter: Payer: Medicare Other | Admitting: *Deleted

## 2021-12-12 ENCOUNTER — Other Ambulatory Visit (HOSPITAL_COMMUNITY): Payer: Self-pay

## 2021-12-12 DIAGNOSIS — Z955 Presence of coronary angioplasty implant and graft: Secondary | ICD-10-CM

## 2021-12-12 DIAGNOSIS — I214 Non-ST elevation (NSTEMI) myocardial infarction: Secondary | ICD-10-CM

## 2021-12-12 NOTE — Progress Notes (Signed)
Daily Session Note  Patient Details  Name: Veronica Romero MRN: [id number] Date of Birth: 12/13/1950 Referring Provider:   Flowsheet Row Cardiac Rehab from 10/17/2021 in [facility] Cardiac and Pulmonary Rehab  Referring Provider Berniece Salines       Encounter Date: 12/12/2021  Check In:  Session Check In - 12/12/21 1001       Check-In   Supervising physician immediately available to respond to emergencies See telemetry face sheet for immediately available ER MD    Location [facility]-Cardiac & Pulmonary Rehab    Staff Present Heath Lark, RN, BSN, CCRP;Jessica [place], MA, RCEP, CCRP, [place], BS, ACSM CEP, Exercise Physiologist    Virtual Visit No    Medication changes reported     No    Fall or balance concerns reported    No    Warm-up and Cool-down Performed on first and last piece of equipment    Resistance Training Performed Yes    VAD Patient? No    PAD/SET Patient? No      Pain Assessment   Currently in Pain? No/denies                Social History   Tobacco Use  Smoking Status Never  Smokeless Tobacco Never    Goals Met:  Independence with exercise equipment Exercise tolerated well No report of concerns or symptoms today  Goals Unmet:  Not Applicable  Comments: Pt able to follow exercise prescription today without complaint.  Will continue to monitor for progression.    Dr. Emily Filbert is Medical Director for [place].  Dr. Ottie Glazier is Medical Director for [facility] Pulmonary Rehabilitation.

## 2021-12-13 ENCOUNTER — Ambulatory Visit: Payer: Medicare Other | Admitting: Nurse Practitioner

## 2021-12-14 DIAGNOSIS — Z955 Presence of coronary angioplasty implant and graft: Secondary | ICD-10-CM

## 2021-12-14 DIAGNOSIS — I214 Non-ST elevation (NSTEMI) myocardial infarction: Secondary | ICD-10-CM

## 2021-12-14 NOTE — Progress Notes (Signed)
Daily Session Note  Patient Details  Name: Veronica Romero MRN: [id number] Date of Birth: 16-Feb-1951 Referring Provider:   Flowsheet Row Cardiac Rehab from 10/17/2021 in [facility] Cardiac and Pulmonary Rehab  Referring Provider Berniece Salines       Encounter Date: 12/14/2021  Check In:  Session Check In - 12/14/21 1540       Check-In   Supervising physician immediately available to respond to emergencies See telemetry face sheet for immediately available ER MD    Location [facility]-Cardiac & Pulmonary Rehab    Staff Present Vida Rigger, RN, BSN;Jessica [place], MA, RCEP, CCRP, CCET    Virtual Visit No    Medication changes reported     Yes    Comments Pt no longer taking Losartan have now started taking Entresto    Fall or balance concerns reported    No    Warm-up and Cool-down Performed on first and last piece of equipment    Resistance Training Performed Yes    VAD Patient? No    PAD/SET Patient? No      Pain Assessment   Currently in Pain? No/denies                Social History   Tobacco Use  Smoking Status Never  Smokeless Tobacco Never    Goals Met:  Independence with exercise equipment Exercise tolerated well No report of concerns or symptoms today Strength training completed today  Goals Unmet:  Not Applicable  Comments: Pt able to follow exercise prescription today without complaint.  Will continue to monitor for progression.   Dr. Emily Filbert is Medical Director for [place].  Dr. Ottie Glazier is Medical Director for [facility] Pulmonary Rehabilitation.

## 2021-12-18 ENCOUNTER — Encounter: Payer: Self-pay | Admitting: Pulmonary Disease

## 2021-12-18 ENCOUNTER — Ambulatory Visit (INDEPENDENT_AMBULATORY_CARE_PROVIDER_SITE_OTHER): Payer: Medicare Other | Admitting: Pulmonary Disease

## 2021-12-18 VITALS — BP 112/60 | HR 95 | Temp 98.0°F | Ht 66.0 in | Wt 161.2 lb

## 2021-12-18 DIAGNOSIS — M0510 Rheumatoid lung disease with rheumatoid arthritis of unspecified site: Secondary | ICD-10-CM | POA: Diagnosis not present

## 2021-12-18 DIAGNOSIS — I2729 Other secondary pulmonary hypertension: Secondary | ICD-10-CM

## 2021-12-18 DIAGNOSIS — I5020 Unspecified systolic (congestive) heart failure: Secondary | ICD-10-CM | POA: Diagnosis not present

## 2021-12-18 DIAGNOSIS — G4736 Sleep related hypoventilation in conditions classified elsewhere: Secondary | ICD-10-CM

## 2021-12-18 DIAGNOSIS — J849 Interstitial pulmonary disease, unspecified: Secondary | ICD-10-CM

## 2021-12-18 DIAGNOSIS — M0579 Rheumatoid arthritis with rheumatoid factor of multiple sites without organ or systems involvement: Secondary | ICD-10-CM

## 2021-12-18 NOTE — Progress Notes (Signed)
Subjective:    Patient ID: Veronica Romero, female    DOB: December 10, 1950, 71 y.o.   MRN: [id number] Patient Care Team: Larae Grooms, NP as PCP - General (Nurse Practitioner) Thomasene Ripple, DO as PCP - Cardiology (Cardiology)  Chief Complaint  Patient presents with   Follow-up   HPI Veronica Romero is a very complex 71 year old lifelong never smoker with severe rheumatoid arthritis and a history as noted below, who presents for follow-up of shortness of breath.  The patient had noted ongoing shortness of breath for approximately 4 months before she was admitted to [facility] on 11 June 2021 with acute respiratory failure and was diagnosed with acute on chronic systolic heart failure.  She was noted to have also significant coronary artery disease and an NSTEMI at that time.  She was also thought to have community-acquired pneumonia she had to be intubated and was extubated on 15 February with negative cultures on bronchoscopy done at that time.  Her LVEF was noted to be 25 to 30%.  She had a left and right heart cath on 17 February that showed a mid LAD lesion that was 85% stenosed, proximal RCA lesion 50% stenosed and an LPA V lesion 50% stenosed.  She was noted to have mild pulmonary hypertension with mean PA pressure of 22 mmHg and a wedge pressure of 14.  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  She was then discharged at that time on medical management to optimize her status and was readmitted on 17 July 2021 for definitive management of her LAD lesion.  Since she had PCI to the LAD with DES x1 she has noted that her shortness of breath has resolved.  Her initial visit here with me was on 12 Sep 2021.  At that time her chest x-ray and CT abnormalities were believed to be a combination of congestive heart failure changes superimposed on rheumatoid lung.  A high-resolution CT of the chest was ordered as well left PFTs.  She has not had the PFTs performed yet.  She had a high-resolution CT chest  was done that showed that the extensive bilateral acute airspace disease noted previously had resolved.  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  As noted because of her pulmonary hypertension, overnight oximetry was performed and the patient did qualify for nocturnal oxygen.  He is compliant with this at 2 L/min nocturnally.  She notes improvement in her symptoms and sleep with the O2.  She does not endorse any active symptomatology with regards to respiratory status today.  She has not had any cough, sputum production nor hemoptysis.  No chest pain.  No orthopnea or paroxysmal nocturnal dyspnea.  No lower extremity edema.  All she feels that she is back to baseline.  Of note she had repeat echocardiogram on 11 July which showed improvement on her LVEF to 40 to 45% (previously 25 to 30%).  She continues to follow with cardiology for her ischemic cardiomyopathy.   Review of Systems A 10 point review of systems was performed and it is as noted above otherwise negative.  Patient Active Problem List   Diagnosis Date Noted   Coronary artery disease involving native coronary artery of native heart without angina pectoris 08/11/2021   CAD (coronary artery disease) 07/17/2021   HFrEF (heart failure with reduced ejection fraction) (HCC) 07/13/2021   Medication management 07/13/2021   Ischemic cardiomyopathy 07/13/2021   Mild pulmonary hypertension (HCC) 07/13/2021  Mixed hyperlipidemia 07/13/2021   Hiatal hernia 07/10/2021   Atherosclerosis of aorta (HCC) 07/10/2021   NSTEMI (non-ST elevated myocardial infarction) (HCC) 06/21/2021   Alcohol use 06/21/2021   Acute on chronic systolic heart failure (HCC) 06/16/2021   CAD (coronary artery disease), native coronary artery 06/16/2021   Acute respiratory failure with hypoxia and hypercapnia (HCC) 06/12/2021   Acute respiratory failure (HCC) 06/12/2021   Malnutrition of moderate  degree 06/12/2021   Multifocal pneumonia 06/11/2021   Chest pain 06/11/2021   Dyspnea 06/11/2021   Rheumatoid arthritis (HCC) 06/11/2021   Social History   Tobacco Use   Smoking status: Never   Smokeless tobacco: Never  Substance Use Topics   Alcohol use: Yes    Alcohol/week: 2.0 standard drinks of alcohol    Types: 2 Glasses of wine per week   Allergies  Allergen Reactions   Penicillins Rash   Current Meds  Medication Sig   Adalimumab (HUMIRA PEN) 40 MG/0.4ML PNKT Inject 0.4 mLs into the skin every Tuesday. Tuesday   aspirin 81 MG EC tablet Take 1 tablet (81 mg total) by mouth daily. Swallow whole.   carvedilol (COREG) 3.125 MG tablet Take 1 tablet (3.125 mg total) by mouth 2 (two) times daily with a meal.   Cholecalciferol (VITAMIN D3 PO) Take 1 tablet by mouth daily.   clopidogrel (PLAVIX) 75 MG tablet Take 1 tablet (75 mg total) by mouth daily.   empagliflozin (JARDIANCE) 10 MG TABS tablet Take 1 tablet (10 mg total) by mouth daily before breakfast.   leucovorin (WELLCOVORIN) 5 MG tablet Take 15 mg by mouth every Saturday.   methotrexate 50 MG/2ML injection Inject 1 mL (25 mg total) into the muscle every Saturday. Hold methotrexate for 2 more weeks and then resume 3/10   Multiple Vitamin (MULTIVITAMIN WITH MINERALS) TABS tablet Take 1 tablet by mouth daily.   nitroGLYCERIN (NITROSTAT) 0.4 MG SL tablet Place 1 tablet (0.4 mg total) under the tongue every 5 (five) minutes as needed.   Polyethyl Glycol-Propyl Glycol (LUBRICANT EYE DROPS) 0.4-0.3 % SOLN Place 1 drop into both eyes 3 (three) times daily as needed (dry/irritated eyes.).   potassium chloride SA (KLOR-CON M) 20 MEQ tablet Take 1 tablet (20 mEq total) by mouth daily.   predniSONE (DELTASONE) 5 MG tablet Take 10 mg by mouth in the morning.   rosuvastatin (CRESTOR) 40 MG tablet Take 1 tablet (40 mg total) by mouth daily.   sacubitril-valsartan (ENTRESTO) 24-26 MG Take 1 tablet by mouth 2 (two) times daily.    spironolactone (ALDACTONE) 25 MG tablet Take 1/2 tablet (12.5 mg total) by mouth daily.   traMADol (ULTRAM) 50 MG tablet Take 50 mg by mouth every 6 (six) hours as needed (pain.).   Immunization History  Administered Date(s) Administered   Influenza,inj,Quad PF,6+ Mos 01/21/2014, 02/01/2015, 02/09/2016, 02/21/2017, 02/13/2018   Influenza-Unspecified 03/11/2019, 02/10/2020, 03/02/2021   PFIZER(Purple Top)SARS-COV-2 Vaccination 06/25/2019, 07/16/2019, 03/01/2020   Pneumococcal Conjugate-13 01/27/2015   Pneumococcal Polysaccharide-23 02/09/2016       Objective:   Physical Exam BP 112/60 (BP Location: Left Arm, Patient Position: Sitting, Cuff Size: Large)   Pulse 95   Temp 98 F (36.7 C) (Oral)   Ht 5\' 6"  (1.676 m)   Wt 161 lb 3.2 oz (73.1 kg)   SpO2 95%   BMI 26.02 kg/m  GENERAL: Well-developed, well-nourished woman, well-groomed, no acute distress.  Fully ambulatory.  No conversational dyspnea. HEAD: Normocephalic, atraumatic.  Wears hearing aid. EYES: Pupils equal, round, reactive to light.  No scleral icterus.  MOUTH: Oral mucosa moist, no thrush. NECK: Supple. No thyromegaly. Trachea midline. No JVD.  No adenopathy. PULMONARY: Good air entry bilaterally.  No adventitious sounds. CARDIOVASCULAR: S1 and S2. Regular rate and rhythm.  No rubs, murmurs or gallops heard. ABDOMEN: Benign. MUSCULOSKELETAL: Severe stigmata of rheumatoid arthritis particularly on both hands with swan-neck deformities and ulnar deviation, no clubbing, no edema.  NEUROLOGIC: No overt focal deficit, no gait disturbance, speech is fluent. SKIN: Intact,warm,dry. PSYCH: Mood and behavior normal.   Representative image of CT high-resolution of 26 Sep 2021, showing that the previously seen extensive bilateral acute airspace opacities were resolved there were underlying residual mild to moderate pulmonary fibrosis in keeping with rheumatoid related interstitial lung disease:   Representative image of coronal  views from high-resolution CT 26 Sep 2021 showing a prominent hiatal hernia:     Assessment & Plan:     ICD-10-CM   1. 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 Rheumatoid arthritis involving multiple sites with positive rheumatoid factor (HCC)  M05.79    This issue adds complexity to her management Currently on Humira and methotrexate Continue follow-up with rheumatology    3. 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 HFrEF (heart failure with reduced ejection fraction) (HCC)  I50.20    Continue follow-up with cardiology This issue adds complexity to her management     Patient has not had PFTs previously requested.  We will reschedule these.  Follow-up will be in 4 months time she is to contact us prior to that time should any new difficulties arise.  Gailen Shelter, MD Advanced Bronchoscopy PCCM [facility]    *This note was dictated using voice recognition software/Dragon.  Despite best efforts to proofread, errors can occur which can change the meaning. Any transcriptional errors that result from this process are unintentional and may not be fully corrected at the time of dictation.

## 2021-12-18 NOTE — Patient Instructions (Signed)
We will reorder the breathing test.  This will give [place] a baseline of your lung function.  We will see you in follow-up in 4 months time call sooner should any new problems arise.

## 2021-12-19 ENCOUNTER — Encounter: Payer: Medicare Other | Admitting: *Deleted

## 2021-12-19 DIAGNOSIS — Z955 Presence of coronary angioplasty implant and graft: Secondary | ICD-10-CM | POA: Diagnosis not present

## 2021-12-19 DIAGNOSIS — I214 Non-ST elevation (NSTEMI) myocardial infarction: Secondary | ICD-10-CM

## 2021-12-19 NOTE — Progress Notes (Signed)
Daily Session Note  Patient Details  Name: Veronica Romero MRN: [id number] Date of Birth: 15-Dec-1950 Referring Provider:   Flowsheet Row Cardiac Rehab from 10/17/2021 in [facility] Cardiac and Pulmonary Rehab  Referring Provider Berniece Salines       Encounter Date: 12/19/2021  Check In:  Session Check In - 12/19/21 1038       Check-In   Supervising physician immediately available to respond to emergencies See telemetry face sheet for immediately available ER MD    Location [facility]-Cardiac & Pulmonary Rehab    Staff Present Heath Lark, RN, BSN, CCRP;Jessica Dumas, MA, RCEP, CCRP, [place], BS, ACSM CEP, Exercise Physiologist    Virtual Visit No    Medication changes reported     No    Fall or balance concerns reported    No    Warm-up and Cool-down Performed on first and last piece of equipment    Resistance Training Performed Yes    VAD Patient? No    PAD/SET Patient? No      Pain Assessment   Currently in Pain? No/denies                Social History   Tobacco Use  Smoking Status Never  Smokeless Tobacco Never    Goals Met:  Independence with exercise equipment Exercise tolerated well No report of concerns or symptoms today  Goals Unmet:  Not Applicable  Comments: Pt able to follow exercise prescription today without complaint.  Will continue to monitor for progression.    Dr. Emily Filbert is Medical Director for [place].  Dr. Ottie Glazier is Medical Director for [facility] Pulmonary Rehabilitation.

## 2021-12-21 ENCOUNTER — Encounter: Payer: Medicare Other | Admitting: *Deleted

## 2021-12-21 DIAGNOSIS — Z955 Presence of coronary angioplasty implant and graft: Secondary | ICD-10-CM | POA: Diagnosis not present

## 2021-12-21 DIAGNOSIS — I214 Non-ST elevation (NSTEMI) myocardial infarction: Secondary | ICD-10-CM

## 2021-12-21 NOTE — Progress Notes (Signed)
Daily Session Note  Patient Details  Name: Veronica Romero MRN: [id number] Date of Birth: 1950/11/09 Referring Provider:   Flowsheet Row Cardiac Rehab from 10/17/2021 in [facility] Cardiac and Pulmonary Rehab  Referring Provider Berniece Salines       Encounter Date: 12/21/2021  Check In:  Session Check In - 12/21/21 1544       Check-In   Supervising physician immediately available to respond to emergencies See telemetry face sheet for immediately available ER MD    Location [facility]-Cardiac & Pulmonary Rehab    Staff Present Renita Papa, RN BSN;Noah Tickle, BS, Exercise Physiologist;Laureen Owens Shark, BS, RRT, CPFT;Joseph [place], [place]    Virtual Visit No    Medication changes reported     No    Fall or balance concerns reported    No    Warm-up and Cool-down Performed on first and last piece of equipment    Resistance Training Performed Yes    VAD Patient? No    PAD/SET Patient? No      Pain Assessment   Currently in Pain? No/denies                Social History   Tobacco Use  Smoking Status Never  Smokeless Tobacco Never    Goals Met:  Independence with exercise equipment Exercise tolerated well No report of concerns or symptoms today Strength training completed today  Goals Unmet:  Not Applicable  Comments: Pt able to follow exercise prescription today without complaint.  Will continue to monitor for progression.    Dr. Emily Filbert is Medical Director for [place].  Dr. Ottie Glazier is Medical Director for [facility] Pulmonary Rehabilitation.

## 2021-12-26 ENCOUNTER — Encounter: Payer: Medicare Other | Admitting: *Deleted

## 2021-12-26 DIAGNOSIS — Z955 Presence of coronary angioplasty implant and graft: Secondary | ICD-10-CM

## 2021-12-26 DIAGNOSIS — I214 Non-ST elevation (NSTEMI) myocardial infarction: Secondary | ICD-10-CM

## 2021-12-26 NOTE — Progress Notes (Signed)
Daily Session Note  Patient Details  Name: Veronica Romero MRN: [id number] Date of Birth: [birth year] Referring Provider:   Flowsheet Row Cardiac Rehab from 10/17/2021 in [facility] Cardiac and Pulmonary Rehab  Referring Provider Berniece Salines       Encounter Date: 12/26/2021  Check In:  Session Check In - 12/26/21 1007       Check-In   Supervising physician immediately available to respond to emergencies See telemetry face sheet for immediately available ER MD    Location [facility]-Cardiac & Pulmonary Rehab    Staff Present Nyoka Cowden, RN, BSN, [place], BS, ACSM CEP, Exercise Physiologist;Jessica [place], MA, RCEP, CCRP, CCET    Virtual Visit No    Medication changes reported     No    Fall or balance concerns reported    No    Tobacco Cessation No Change    Warm-up and Cool-down Performed on first and last piece of equipment    Resistance Training Performed Yes    VAD Patient? No    PAD/SET Patient? No      Pain Assessment   Currently in Pain? No/denies                Social History   Tobacco Use  Smoking Status Never  Smokeless Tobacco Never    Goals Met:  Independence with exercise equipment Exercise tolerated well No report of concerns or symptoms today  Goals Unmet:  Not Applicable  Comments: Pt able to follow exercise prescription today without complaint.  Will continue to monitor for progression.    Dr. Emily Filbert is Medical Director for [place].  Dr. Ottie Glazier is Medical Director for [facility] Pulmonary Rehabilitation.

## 2021-12-27 DIAGNOSIS — M059 Rheumatoid arthritis with rheumatoid factor, unspecified: Principal | ICD-10-CM

## 2021-12-27 DIAGNOSIS — M05742 Rheumatoid arthritis with rheumatoid factor of left hand without organ or systems involvement: Principal | ICD-10-CM

## 2021-12-27 DIAGNOSIS — M8589 Other specified disorders of bone density and structure, multiple sites: Principal | ICD-10-CM

## 2021-12-27 DIAGNOSIS — M05741 Rheumatoid arthritis with rheumatoid factor of right hand without organ or systems involvement: Principal | ICD-10-CM

## 2021-12-27 DIAGNOSIS — M0579 Rheumatoid arthritis with rheumatoid factor of multiple sites without organ or systems involvement: Principal | ICD-10-CM

## 2021-12-27 MED ORDER — METHOTREXATE SODIUM (CONTAINS PRESERVATIVES) 25 MG/ML INJECTION SOLUTION
SUBCUTANEOUS | 2 refills | 56 days | Status: CP
Start: 2021-12-27 — End: ?

## 2021-12-28 ENCOUNTER — Encounter: Payer: Medicare Other | Admitting: *Deleted

## 2021-12-28 DIAGNOSIS — Z955 Presence of coronary angioplasty implant and graft: Secondary | ICD-10-CM | POA: Diagnosis not present

## 2021-12-28 DIAGNOSIS — I214 Non-ST elevation (NSTEMI) myocardial infarction: Secondary | ICD-10-CM

## 2021-12-28 NOTE — Progress Notes (Signed)
Daily Session Note  Patient Details  Name: Veronica Romero MRN: [id number] Date of Birth: [birth year] Referring Provider:   Flowsheet Row Cardiac Rehab from 10/17/2021 in [facility] Cardiac and Pulmonary Rehab  Referring Provider Berniece Salines       Encounter Date: 12/28/2021  Check In:  Session Check In - 12/28/21 1546       Check-In   Supervising physician immediately available to respond to emergencies See telemetry face sheet for immediately available ER MD    Location [facility]-Cardiac & Pulmonary Rehab    Staff Present Renita Papa, RN BSN;Joseph [place], RCP,RRT,BSRT;Jessica [place], [place], RCEP, CCRP, CCET    Virtual Visit No    Medication changes reported     No    Fall or balance concerns reported    No    Warm-up and Cool-down Performed on first and last piece of equipment    Resistance Training Performed Yes    VAD Patient? No    PAD/SET Patient? No      Pain Assessment   Currently in Pain? No/denies                Social History   Tobacco Use  Smoking Status Never  Smokeless Tobacco Never    Goals Met:  Independence with exercise equipment Exercise tolerated well No report of concerns or symptoms today Strength training completed today  Goals Unmet:  Not Applicable  Comments: Pt able to follow exercise prescription today without complaint.  Will continue to monitor for progression.    Dr. Emily Filbert is Medical Director for [place].  Dr. Ottie Glazier is Medical Director for [facility] Pulmonary Rehabilitation.

## 2022-01-02 ENCOUNTER — Encounter: Payer: Medicare Other | Attending: Cardiology | Admitting: *Deleted

## 2022-01-02 DIAGNOSIS — Z48812 Encounter for surgical aftercare following surgery on the circulatory system: Secondary | ICD-10-CM | POA: Diagnosis not present

## 2022-01-02 DIAGNOSIS — Z955 Presence of coronary angioplasty implant and graft: Secondary | ICD-10-CM | POA: Insufficient documentation

## 2022-01-02 DIAGNOSIS — I252 Old myocardial infarction: Secondary | ICD-10-CM | POA: Diagnosis not present

## 2022-01-02 DIAGNOSIS — I214 Non-ST elevation (NSTEMI) myocardial infarction: Secondary | ICD-10-CM

## 2022-01-02 NOTE — Progress Notes (Signed)
Daily Session Note  Patient Details  Name: Veronica Romero MRN: [id number] Date of Birth: 21-Aug-1950 Referring Provider:   Flowsheet Row Cardiac Rehab from 10/17/2021 in [facility] Cardiac and Pulmonary Rehab  Referring Provider Berniece Salines       Encounter Date: 01/02/2022  Check In:  Session Check In - 01/02/22 1006       Check-In   Supervising physician immediately available to respond to emergencies See telemetry face sheet for immediately available ER MD    Location [facility]-Cardiac & Pulmonary Rehab    Staff Present Nyoka Cowden, RN, BSN, [place], BS, Exercise Physiologist;Jessica [place], MA, RCEP, CCRP, CCET    Virtual Visit No    Medication changes reported     No    Fall or balance concerns reported    No    Tobacco Cessation No Change    Warm-up and Cool-down Performed on first and last piece of equipment    Resistance Training Performed Yes    VAD Patient? No    PAD/SET Patient? No                Social History   Tobacco Use  Smoking Status Never  Smokeless Tobacco Never    Goals Met:  Independence with exercise equipment Exercise tolerated well No report of concerns or symptoms today  Goals Unmet:  Not Applicable  Comments: Pt able to follow exercise prescription today without complaint.  Will continue to monitor for progression.    Dr. Emily Filbert is Medical Director for [place].  Dr. Ottie Glazier is Medical Director for [facility] Pulmonary Rehabilitation.

## 2022-01-03 DIAGNOSIS — I214 Non-ST elevation (NSTEMI) myocardial infarction: Secondary | ICD-10-CM

## 2022-01-03 DIAGNOSIS — Z955 Presence of coronary angioplasty implant and graft: Secondary | ICD-10-CM

## 2022-01-03 NOTE — Progress Notes (Unsigned)
Cardiac Individual Treatment Plan  Patient Details  Name: Veronica Romero MRN: [id number] Date of Birth: 1951/03/08 Referring Provider:   Flowsheet Row Cardiac Rehab from 10/17/2021 in [facility] Cardiac and Pulmonary Rehab  Referring Provider Berniece Salines       Initial Encounter Date:  Flowsheet Row Cardiac Rehab from 10/17/2021 in [facility] Cardiac and Pulmonary Rehab  Date 10/17/21       Visit Diagnosis: NSTEMI (non-ST elevation myocardial infarction) [facility])  Status post coronary artery stent placement  Patient's Home Medications on Admission:  Current Outpatient Medications:    Adalimumab (HUMIRA PEN) 40 MG/0.4ML PNKT, Inject 0.4 mLs into the skin every Tuesday. Tuesday, Disp: , Rfl:    aspirin 81 MG EC tablet, Take 1 tablet (81 mg total) by mouth daily. Swallow whole., Disp: 90 tablet, Rfl: 1   carvedilol (COREG) 3.125 MG tablet, Take 1 tablet (3.125 mg total) by mouth 2 (two) times daily with a meal., Disp: 180 tablet, Rfl: 2   Cholecalciferol (VITAMIN D3 PO), Take 1 tablet by mouth daily., Disp: , Rfl:    clopidogrel (PLAVIX) 75 MG tablet, Take 1 tablet (75 mg total) by mouth daily., Disp: 90 tablet, Rfl: 2   empagliflozin (JARDIANCE) 10 MG TABS tablet, Take 1 tablet (10 mg total) by mouth daily before breakfast., Disp: 90 tablet, Rfl: 3   furosemide (LASIX) 40 MG tablet, Take 1 tablet (40 mg total) by mouth every other day., Disp: 45 tablet, Rfl: 3   leucovorin (WELLCOVORIN) 5 MG tablet, Take 15 mg by mouth every Saturday., Disp: , Rfl:    methotrexate 50 MG/2ML injection, Inject 1 mL (25 mg total) into the muscle every Saturday. Hold methotrexate for 2 more weeks and then resume 3/10, Disp: , Rfl:    Multiple Vitamin (MULTIVITAMIN WITH MINERALS) TABS tablet, Take 1 tablet by mouth daily., Disp: , Rfl:    nitroGLYCERIN (NITROSTAT) 0.4 MG SL tablet, Place 1 tablet (0.4 mg total) under the tongue every 5 (five) minutes as needed., Disp: 25 tablet, Rfl: 2   Polyethyl Glycol-Propyl Glycol  (LUBRICANT EYE DROPS) 0.4-0.3 % SOLN, Place 1 drop into both eyes 3 (three) times daily as needed (dry/irritated eyes.)., Disp: , Rfl:    potassium chloride SA (KLOR-CON M) 20 MEQ tablet, Take 1 tablet (20 mEq total) by mouth daily., Disp: 90 tablet, Rfl: 3   predniSONE (DELTASONE) 5 MG tablet, Take 10 mg by mouth in the morning., Disp: , Rfl:    rosuvastatin (CRESTOR) 40 MG tablet, Take 1 tablet (40 mg total) by mouth daily., Disp: 90 tablet, Rfl: 0   sacubitril-valsartan (ENTRESTO) 24-26 MG, Take 1 tablet by mouth 2 (two) times daily., Disp: 180 tablet, Rfl: 2   spironolactone (ALDACTONE) 25 MG tablet, Take 1/2 tablet (12.5 mg total) by mouth daily., Disp: 30 tablet, Rfl: 2   traMADol (ULTRAM) 50 MG tablet, Take 50 mg by mouth every 6 (six) hours as needed (pain.)., Disp: , Rfl:   Past Medical History: Past Medical History:  Diagnosis Date   Ischemic cardiomyopathy 06/12/2021   EF 25-30%   Rheumatoid arthritis ([place])     Tobacco Use: Social History   Tobacco Use  Smoking Status Never  Smokeless Tobacco Never    Labs: Review Flowsheet       Latest Ref Rng & Units 06/12/2021 06/13/2021 06/16/2021  Labs for ITP Cardiac and Pulmonary Rehab  Cholestrol 0 - 200 mg/dL - - 177   LDL (calc) 0 - 99 mg/dL - - 116   HDL-C >  40 mg/dL - - 38   Trlycerides <150 mg/dL - 66  115   Hemoglobin A1c 4.8 - 5.6 % 5.4  - -  PH, Arterial 7.35 - 7.45 7.451  7.155  7.359  7.471  7.518  7.507  7.513   PCO2 arterial 32 - 48 mmHg 39.0  72.5  41.8  29.8  37.0  38.0  39.7   Bicarbonate 20.0 - 28.0 mmol/L 26.6  24.7  23.7  22.0  30.1  30.1  31.9   TCO2 22 - 32 mmol/L _0 33   Acid-base deficit 0.0 - 2.0 mmol/L 4.0  2.0  1.0  -  O2 Saturation % 100.0  94.0  97.0  96.0  94  70  68      Exercise Target Goals: Exercise Program Goal: Individual exercise prescription set using results from initial 6 min walk test and THRR while considering  patient's activity barriers and safety.   Exercise  Prescription Goal: Initial exercise prescription builds to 30-45 minutes a day of aerobic activity, 2-3 days per week.  Home exercise guidelines will be given to patient during program as part of exercise prescription that the participant will acknowledge.   Education: Aerobic Exercise: - Group verbal and visual presentation on the components of exercise prescription. Introduces F.I.T.T principle from ACSM for exercise prescriptions.  Reviews F.I.T.T. principles of aerobic exercise including progression. Written material given at graduation. Flowsheet Row Cardiac Rehab from 10/17/2021 in [facility] Cardiac and Pulmonary Rehab  Education need identified 10/17/21       Education: Resistance Exercise: - Group verbal and visual presentation on the components of exercise prescription. Introduces F.I.T.T principle from ACSM for exercise prescriptions  Reviews F.I.T.T. principles of resistance exercise including progression. Written material given at graduation.    Education: Exercise & Equipment Safety: - Individual verbal instruction and demonstration of equipment use and safety with use of the equipment. Flowsheet Row Cardiac Rehab from 10/17/2021 in [facility] Cardiac and Pulmonary Rehab  Date 10/17/21  Educator [facility]  Instruction Review Code 1- Verbalizes Understanding       Education: Exercise Physiology & General Exercise Guidelines: - Group verbal and written instruction with models to review the exercise physiology of the cardiovascular system and associated critical values. Provides general exercise guidelines with specific guidelines to those with heart or lung disease.    Education: Flexibility, Balance, Mind/Body Relaxation: - Group verbal and visual presentation with interactive activity on the components of exercise prescription. Introduces F.I.T.T principle from ACSM for exercise prescriptions. Reviews F.I.T.T. principles of flexibility and balance exercise training including progression. Also  discusses the mind body connection.  Reviews various relaxation techniques to help reduce and manage stress (i.e. Deep breathing, progressive muscle relaxation, and visualization). Balance handout provided to take home. Written material given at graduation.   Activity Barriers & Risk Stratification:  Activity Barriers & Cardiac Risk Stratification - 10/06/21 1316       Activity Barriers & Cardiac Risk Stratification   Activity Barriers Arthritis    Cardiac Risk Stratification Moderate             6 Minute Walk:  6 Minute Walk     Row Name 10/17/21 1200         6 Minute Walk   Phase Initial     Distance 850 feet     Walk Time 6 minutes     # of Rest Breaks 0  MPH 1.6     METS 2.19     RPE 9     Perceived Dyspnea  0     VO2 Peak 7.68     Symptoms Yes (comment)     Comments arthritic foot pain 4/10     Resting HR 69 bpm     Resting BP 136/74     Resting Oxygen Saturation  98 %     Exercise Oxygen Saturation  during 6 min walk 97 %     Max Ex. HR 100 bpm     Max Ex. BP 142/70     2 Minute Post BP 130/80              Oxygen Initial Assessment:   Oxygen Re-Evaluation:   Oxygen Discharge (Final Oxygen Re-Evaluation):   Initial Exercise Prescription:  Initial Exercise Prescription - 10/17/21 1200       Date of Initial Exercise RX and Referring Provider   Date 10/17/21    Referring Provider Godfrey Pick Tobb      Oxygen   Maintain Oxygen Saturation 88% or higher      Recumbant Bike   Level 1    RPM 50    Minutes 15    METs 2.19      NuStep   Level 1    SPM 80    Minutes 15    METs 2.19      REL-XR   Level 1    Speed 50    Minutes 15    METs 2.19      Track   Laps 22    Minutes 15    METs 2.2      Prescription Details   Frequency (times per week) 2    Duration Progress to 30 minutes of continuous aerobic without signs/symptoms of physical distress      Intensity   THRR 40-80% of Max Heartrate 100-132    Ratings of Perceived  Exertion 11-13    Perceived Dyspnea 0-4      Progression   Progression Continue to progress workloads to maintain intensity without signs/symptoms of physical distress.      Resistance Training   Training Prescription Yes    Weight 2    Reps 10-15             Perform Capillary Blood Glucose checks as needed.  Exercise Prescription Changes:   Exercise Prescription Changes     Row Name 10/17/21 1200 11/07/21 1500 11/09/21 1600 11/20/21 1400 12/04/21 1500     Response to Exercise   Blood Pressure (Admit) 136/74 120/62 -- 116/60 122/58   Blood Pressure (Exercise) 142/70 122/82 -- 134/62 132/60   Blood Pressure (Exit) 130/80 118/58 -- 122/70 118/60   Heart Rate (Admit) 69 bpm 70 bpm -- 100 bpm 80 bpm   Heart Rate (Exercise) 100 bpm 118 bpm -- 112 bpm 105 bpm   Heart Rate (Exit) 74 bpm 89 bpm -- 92 bpm 92 bpm   Oxygen Saturation (Admit) 98 % -- -- -- --   Oxygen Saturation (Exercise) 97 % -- -- -- --   Oxygen Saturation (Exit) 98 % -- -- -- --   Rating of Perceived Exertion (Exercise) 9 12 -- 14 12   Perceived Dyspnea (Exercise) 0 -- -- -- --   Symptoms foot pain (4/10) foot pain -- foot pain none   Comments 6 MWT results -- -- -- --   Duration -- Progress to 30 minutes of  aerobic without signs/symptoms of  physical distress -- Continue with 30 min of aerobic exercise without signs/symptoms of physical distress. Continue with 30 min of aerobic exercise without signs/symptoms of physical distress.   Intensity -- THRR unchanged -- THRR unchanged THRR unchanged     Progression   Progression -- Continue to progress workloads to maintain intensity without signs/symptoms of physical distress. -- Continue to progress workloads to maintain intensity without signs/symptoms of physical distress. Continue to progress workloads to maintain intensity without signs/symptoms of physical distress.   Average METs -- 2.06 -- 2.62 2.77     Resistance Training   Training Prescription -- Yes --  Yes Yes   Weight -- 3 lb -- 3 lb 3 lb   Reps -- 10-15 -- 10-15 10-15     Interval Training   Interval Training -- No -- No No     Recumbant Bike   Level -- -- -- -- 1.5   Minutes -- -- -- -- 15   METs -- -- -- -- 3.09     NuStep   Level -- 4 -- 4 3   Minutes -- 15 -- 15 15   METs -- 2.3 -- 2.9 2.4     REL-XR   Level -- 1 -- 3 3   Minutes -- 15 -- 15 15   METs -- -- -- 3.2 3.3     Track   Laps -- 15 -- 14 24   Minutes -- 15 -- 15 15   METs -- 1.82 -- 1.76 2.31     Home Exercise Plan   Plans to continue exercise at -- -- Home (comment)  walking, gym, staff videos Home (comment)  walking, gym, staff videos Home (comment)  walking, gym, staff videos   Frequency -- -- Add 2 additional days to program exercise sessions. Add 2 additional days to program exercise sessions. Add 2 additional days to program exercise sessions.   Initial Home Exercises Provided -- -- 11/09/21 11/09/21 11/09/21     Oxygen   Maintain Oxygen Saturation -- 88% or higher -- 88% or higher 88% or higher    Row Name 12/18/21 1400 01/02/22 0900           Response to Exercise   Blood Pressure (Admit) 116/64 122/62      Blood Pressure (Exit) 126/64 128/70      Heart Rate (Admit) 86 bpm 63 bpm      Heart Rate (Exercise) 103 bpm 106 bpm      Heart Rate (Exit) 94 bpm 85 bpm      Rating of Perceived Exertion (Exercise) 15 13      Symptoms none none      Duration Continue with 30 min of aerobic exercise without signs/symptoms of physical distress. Continue with 30 min of aerobic exercise without signs/symptoms of physical distress.      Intensity THRR unchanged THRR unchanged        Progression   Progression Continue to progress workloads to maintain intensity without signs/symptoms of physical distress. Continue to progress workloads to maintain intensity without signs/symptoms of physical distress.      Average METs 2.7 2.63        Resistance Training   Training Prescription Yes Yes      Weight 3 lb 3  lb      Reps 10-15 10-15        Interval Training   Interval Training No No        Treadmill   MPH -- 1  Grade -- 0      Minutes -- 15      METs -- 1.77        Recumbant Bike   Level 1 1      Minutes 15 15      METs 3.09 3.09        NuStep   Level 4 4      Minutes 15 15      METs 2.6 3        REL-XR   Level 5 5      Minutes 15 15      METs 4.4 3        Track   Laps 20 20      Minutes 15 15      METs 2.09 2.09        Home Exercise Plan   Plans to continue exercise at Home (comment)  walking, gym, staff videos Home (comment)  walking, gym, staff videos      Frequency Add 2 additional days to program exercise sessions. Add 2 additional days to program exercise sessions.      Initial Home Exercises Provided 11/09/21 11/09/21        Oxygen   Maintain Oxygen Saturation 88% or higher 88% or higher               Exercise Comments:   Exercise Comments     Row Name 10/24/21 0956           Exercise Comments First full day of exercise!  Patient was oriented to gym and equipment including functions, settings, policies, and procedures.  Patient's individual exercise prescription and treatment plan were reviewed.  All starting workloads were established based on the results of the 6 minute walk test done at initial orientation visit.  The plan for exercise progression was also introduced and progression will be customized based on patient's performance and goals.                Exercise Goals and Review:   Exercise Goals     Row Name 10/17/21 1206             Exercise Goals   Increase Physical Activity Yes       Intervention Provide advice, education, support and counseling about physical activity/exercise needs.;Develop an individualized exercise prescription for aerobic and resistive training based on initial evaluation findings, risk stratification, comorbidities and participant's personal goals.       Expected Outcomes Short Term: Attend rehab on a  regular basis to increase amount of physical activity.;Long Term: Add in home exercise to make exercise part of routine and to increase amount of physical activity.;Long Term: Exercising regularly at least 3-5 days a week.       Increase Strength and Stamina Yes       Intervention Provide advice, education, support and counseling about physical activity/exercise needs.;Develop an individualized exercise prescription for aerobic and resistive training based on initial evaluation findings, risk stratification, comorbidities and participant's personal goals.       Expected Outcomes Short Term: Increase workloads from initial exercise prescription for resistance, speed, and METs.;Short Term: Perform resistance training exercises routinely during rehab and add in resistance training at home;Long Term: Improve cardiorespiratory fitness, muscular endurance and strength as measured by increased METs and functional capacity (6MWT)       Able to understand and use rate of perceived exertion (RPE) scale Yes       Intervention Provide education and explanation on  how to use RPE scale       Expected Outcomes Short Term: Able to use RPE daily in rehab to express subjective intensity level;Long Term:  Able to use RPE to guide intensity level when exercising independently       Able to understand and use Dyspnea scale Yes       Intervention Provide education and explanation on how to use Dyspnea scale       Expected Outcomes Short Term: Able to use Dyspnea scale daily in rehab to express subjective sense of shortness of breath during exertion;Long Term: Able to use Dyspnea scale to guide intensity level when exercising independently       Knowledge and understanding of Target Heart Rate Range (THRR) Yes       Intervention Provide education and explanation of THRR including how the numbers were predicted and where they are located for reference       Expected Outcomes Short Term: Able to state/look up THRR;Long Term:  Able to use THRR to govern intensity when exercising independently;Short Term: Able to use daily as guideline for intensity in rehab       Able to check pulse independently Yes       Intervention Provide education and demonstration on how to check pulse in carotid and radial arteries.;Review the importance of being able to check your own pulse for safety during independent exercise       Expected Outcomes Short Term: Able to explain why pulse checking is important during independent exercise;Long Term: Able to check pulse independently and accurately       Understanding of Exercise Prescription Yes       Intervention Provide education, explanation, and written materials on patient's individual exercise prescription       Expected Outcomes Short Term: Able to explain program exercise prescription;Long Term: Able to explain home exercise prescription to exercise independently                Exercise Goals Re-Evaluation :  Exercise Goals Re-Evaluation     Row Name 10/24/21 0956 11/07/21 1520 11/09/21 1614 11/16/21 1533 11/20/21 1436     Exercise Goal Re-Evaluation   Exercise Goals Review Increase Physical Activity;Able to understand and use rate of perceived exertion (RPE) scale;Knowledge and understanding of Target Heart Rate Range (THRR);Understanding of Exercise Prescription;Able to understand and use Dyspnea scale;Able to check pulse independently;Increase Strength and Stamina Increase Physical Activity;Increase Strength and Stamina;Understanding of Exercise Prescription Increase Physical Activity;Increase Strength and Stamina;Understanding of Exercise Prescription;Able to understand and use rate of perceived exertion (RPE) scale;Able to understand and use Dyspnea scale;Able to check pulse independently;Knowledge and understanding of Target Heart Rate Range (THRR) Increase Physical Activity;Increase Strength and Stamina;Understanding of Exercise Prescription Increase Physical Activity;Increase  Strength and Stamina;Understanding of Exercise Prescription   Comments Reviewed RPE and dyspnea scales, THR and program prescription with pt today.  Pt voiced understanding and was given a copy of goals to take home. Efrata is off to a good start in rehab.  She has found shoes that she is tolerating, but walking still hurts her feet.  She is trying to do one walking station and one seated each day.  She has surprised herself at how well she is doing.  She is already going on the seated equipment without stopping and only rest on track to give her feet a break. We will continue to monitor her progress. Reviewed home exercise with pt today.  Pt plans to walk and go up to gym  in her apartment complex at home for exercise.  We also talked about using our staff videos at home so that she doesn't have to leave her husband.  Reviewed THR, pulse, RPE, sign and symptoms, pulse oximetery and when to call 911 or MD.  Also discussed weather considerations and indoor options.  Pt voiced understanding. Melysa feels like she is doing well in the program. She states that she has noticed an increase in her endurance and energy since coming to rehab. Patient says she still can not leave her husband so finding time to exercise at home can be difficult. She does plan on checking out the gym at her apartment complex. Tida is doing well in rehab.  She is up to level 4 on the NuStep.  Her feet are doing better than she thought they would in rehab.  She does walk some depending on how much her feet can tolerate.  She is still using 3 lb hand weights due to her grip.  We will continue to monitor her progress.   Expected Outcomes Short: Use RPE daily to regulate intensity. Long: Follow program prescription in THR. Short: Continue to attend rehab regularly Long: Continue to follow program prescription Short: Check out gym at complex and try staff videos Long: Continue to exercise independently Short: Check out gym at apartment complex.  Long: Continue to exercise on days away from rehab. Short: Continue to walk at least once a week Long: Continue to improve stamina    Row Name 12/04/21 1554 12/12/21 0956 12/18/21 1441 01/02/22 0903       Exercise Goal Re-Evaluation   Exercise Goals Review Increase Physical Activity;Increase Strength and Stamina;Understanding of Exercise Prescription Increase Physical Activity;Increase Strength and Stamina;Understanding of Exercise Prescription Increase Physical Activity;Increase Strength and Stamina;Understanding of Exercise Prescription Increase Physical Activity;Increase Strength and Stamina;Understanding of Exercise Prescription    Comments Cherokee is doing well in rehab. She reached a max of 24 laps on the track! She hits her THR most sessions. RPEs are staying in good range. We will continue to monitor. Tori is doing well in rehab.  She feels like she is running in circles  when she tries to exercise at home, as her husband seems to always need something.  She has not tried the videos yet, so we talked about how her husband could do the chair exercises with her.  She does feel like she is getting better. Anadalay continues to do well in rehab. She has kept her overall average MET level above 2.7 METs. She also improved to level 5 on the XR. Audreyanna has also consistently reached 20 laps on the track. We will continue to monitor her progress in the program. Dorenda is doing well in rehab. She began using the treadmill and did well at 1 mph with no incline. She has also consistently walked over 20 laps on the track. She has done well with the T4 at level 4 as well. We will continue to monitor her progress in the program.    Expected Outcomes Short: Continue to work up laps on track Long: Continue to increase overall MET level Short: Try to use videos Long: Continue to improve stamina Short: Continue to push for more laps on the track. Long: Continue to increase strength and stamina. Short: Continue to push  for more laps on the track. Long: Continue to increase strength and stamina.             Discharge Exercise Prescription (Final Exercise Prescription Changes):  Exercise Prescription Changes - 01/02/22 0900       Response to Exercise   Blood Pressure (Admit) 122/62    Blood Pressure (Exit) 128/70    Heart Rate (Admit) 63 bpm    Heart Rate (Exercise) 106 bpm    Heart Rate (Exit) 85 bpm    Rating of Perceived Exertion (Exercise) 13    Symptoms none    Duration Continue with 30 min of aerobic exercise without signs/symptoms of physical distress.    Intensity THRR unchanged      Progression   Progression Continue to progress workloads to maintain intensity without signs/symptoms of physical distress.    Average METs 2.63      Resistance Training   Training Prescription Yes    Weight 3 lb    Reps 10-15      Interval Training   Interval Training No      Treadmill   MPH 1    Grade 0    Minutes 15    METs 1.77      Recumbant Bike   Level 1    Minutes 15    METs 3.09      NuStep   Level 4    Minutes 15    METs 3      REL-XR   Level 5    Minutes 15    METs 3      Track   Laps 20    Minutes 15    METs 2.09      Home Exercise Plan   Plans to continue exercise at Home (comment)   walking, gym, staff videos   Frequency Add 2 additional days to program exercise sessions.    Initial Home Exercises Provided 11/09/21      Oxygen   Maintain Oxygen Saturation 88% or higher             Nutrition:  Target Goals: Understanding of nutrition guidelines, daily intake of sodium <1582m, cholesterol <2074m calories 30% from fat and 7% or less from saturated fats, daily to have 5 or more servings of fruits and vegetables.  Education: All About Nutrition: -Group instruction provided by verbal, written material, interactive activities, discussions, models, and posters to present general guidelines for heart healthy nutrition including fat, fiber, MyPlate, the role of  sodium in heart healthy nutrition, utilization of the nutrition label, and utilization of this knowledge for meal planning. Follow up email sent as well. Written material given at graduation. Flow[facility]  Education need identified 10/17/21       Biometrics:  Pre Biometrics - 10/17/21 1207       Pre Biometrics   Height _0  (1.676 m)    Weight 156 lb 6.4 oz (70.9 kg)    BMI (Calculated) 25.26    Single Leg Stand 4.53 seconds              Nutrition Therapy Plan and Nutrition Goals:  Nutrition Therapy & Goals - 11/29/21 0932       Nutrition Therapy   Diet Heart healthy, low Na    Drug/Food Interactions Statins/Certain Fruits    Protein (specify units) 85-95g    Fiber 25 grams    Whole Grain Foods 3 servings    Saturated Fats 12 max. grams    Fruits and Vegetables 8 servings/day    Sodium 2 grams      Personal Nutrition Goals   Nutrition Goal ST: include snacks  in-between meals using examples provided and protien, fat, CHO structure LT: meet nutritional needs (calories and protein) while choosing heart healthy foods most often    Comments 71 y.o. F admitted to cardiac rehab s/p NSTEMI. PMHx includes "Moderate Malnutrition related to acute illness (PNA) as evidenced by moderate muscle depletion, mild fat depletion, moderate fat depletion." noted 06/12/21, rheumatoid arthritis, CAD, HFrEF, pulmonary HTN, HLD. Relevant medications includes vit D3, leucovorin, MVI, furosemide, K+, methotrexate, prednisone, crestor, tramadol. PYP Score: 71. Vegetables & Fruits 6/12. Breads, Grains & Cereals 7/12. Red & Processed Meat 10/12. Poultry 2/2. Fish & Shellfish 3/4. Beans, Nuts & Seeds 1/4. Milk & Dairy Foods 5/6. Toppings, Oils, Seasonings & Salt 16/20. Sweets, Snacks & Restaurant Food 11/14. Beverages 10/10. Sweetie reports eating this way for a while and reports no barriers aside from flavor building. B: Toast and sometimes  an egg L: leftovers or fruit with yogurt - she reports not eating much D: she reports she will eat dinner most days: she reports not using lots of vegetables like she used to. She limits sweets for the most part. Drinks: iced coffee in morning, red bull (she reports feeling tired), 1-2 glasses of wine, water. She reports difficulty sleeping and she will feel fatigued a lot of the day, she reports that exercise is helping. She reports having a lower appetite recently - she has enjoyed cooking and eating, but since her husband health has declined this past year, her appetite has been worse. She has tried protein drinks before, but she does not like them. When she was in the hospital in February her weight was 75.4kg and she is now 70.9kg; when she was in the hospital at that time she was diagnosed with moderate malnutrition as evidenced by moderate muscle depletion, mild fat depletion, moderate fat depletion. Encouraged mechanical eating, variety in foods, snacks in-between meals with protein, fat, and CHO, choosing heart healthy foods when possible while trying to meet calorie and protein needs.      Intervention Plan   Intervention Prescribe, educate and counsel regarding individualized specific dietary modifications aiming towards targeted core components such as weight, hypertension, lipid management, diabetes, heart failure and other comorbidities.    Expected Outcomes Short Term Goal: Understand basic principles of dietary content, such as calories, fat, sodium, cholesterol and nutrients.;Short Term Goal: A plan has been developed with personal nutrition goals set during dietitian appointment.;Long Term Goal: Adherence to prescribed nutrition plan.             Nutrition Assessments:  MEDIFICTS Score Key: ?70 Need to make dietary changes  40-70 Heart Healthy Diet ? 40 Therapeutic Level Cholesterol Diet  Flowsheet Row Cardiac Rehab from 10/17/2021 in [facility]  Picture  Your Plate Total Score on Admission 71      Picture Your Plate Scores: <09 Unhealthy dietary pattern with much room for improvement. 41-50 Dietary pattern unlikely to meet recommendations for good health and room for improvement. 51-60 More healthful dietary pattern, with some room for improvement.  >60 Healthy dietary pattern, although there may be some specific behaviors that could be improved.    Nutrition Goals Re-Evaluation:  Nutrition Goals Re-Evaluation     [place] Name 12/12/21 1000             Goals   Nutrition Goal ST: include snacks in-between meals using examples provided and protien, fat, CHO structure LT: meet nutritional needs (calories and protein) while choosing heart healthy foods most often  Comment Alejandria is doing well in rehab.  She met with dietitian last week.  She is going to try to get their snacking better and aim for protein snacks. She has given up on cooking as her husband won't eat much of her cooking and is very picky.  She tries to make healthier options.       Expected Outcome Short: Try more protein snacks.  Long: Continue to focus on heart healthy eating                Nutrition Goals Discharge (Final Nutrition Goals Re-Evaluation):  Nutrition Goals Re-Evaluation - 12/12/21 1000       Goals   Nutrition Goal ST: include snacks in-between meals using examples provided and protien, fat, CHO structure LT: meet nutritional needs (calories and protein) while choosing heart healthy foods most often    Comment Gracieann is doing well in rehab.  She met with dietitian last week.  She is going to try to get their snacking better and aim for protein snacks. She has given up on cooking as her husband won't eat much of her cooking and is very picky.  She tries to make healthier options.    Expected Outcome Short: Try more protein snacks.  Long: Continue to focus on heart healthy eating             Psychosocial: Target Goals: Acknowledge presence or  absence of significant depression and/or stress, maximize coping skills, provide positive support system. Participant is able to verbalize types and ability to use techniques and skills needed for reducing stress and depression.   Education: Stress, Anxiety, and Depression - Group verbal and visual presentation to define topics covered.  Reviews how body is impacted by stress, anxiety, and depression.  Also discusses healthy ways to reduce stress and to treat/manage anxiety and depression.  Written material given at graduation.   Education: Sleep Hygiene -Provides group verbal and written instruction about how sleep can affect your health.  Define sleep hygiene, discuss sleep cycles and impact of sleep habits. Review good sleep hygiene tips.    Initial Review & Psychosocial Screening:  Initial Psych Review & Screening - 10/06/21 1319       Initial Review   Current issues with Current Stress Concerns    Source of Stress Concerns Family    Comments Stress with husband needing total care.  Does have helpers every day so she can do what she needs to do.      Family Dynamics   Good Support System? Yes   daughter 2 grandkids     Barriers   Psychosocial barriers to participate in program The patient should benefit from training in stress management and relaxation.      Screening Interventions   Interventions Encouraged to exercise;To provide support and resources with identified psychosocial needs;Provide feedback about the scores to participant    Expected Outcomes Short Term goal: Utilizing psychosocial counselor, staff and physician to assist with identification of specific Stressors or current issues interfering with healing process. Setting desired goal for each stressor or current issue identified.;Long Term Goal: Stressors or current issues are controlled or eliminated.;Short Term goal: Identification and review with participant of any Quality of Life or Depression concerns found by  scoring the questionnaire.;Long Term goal: The participant improves quality of Life and PHQ9 Scores as seen by post scores and/or verbalization of changes             Quality of Life Scores:   Quality  of Life - 10/17/21 1209       Quality of Life   Select Quality of Life      Quality of Life Scores   Health/Function Pre 11.36 %    Socioeconomic Pre 13.13 %    Psych/Spiritual Pre 10.92 %    Family Pre 20.63 %    GLOBAL Pre 12.84 %            Scores of 19 and below usually indicate a poorer quality of life in these areas.  A difference of  2-3 points is a clinically meaningful difference.  A difference of 2-3 points in the total score of the Quality of Life Index has been associated with significant improvement in overall quality of life, self-image, physical symptoms, and general health in studies assessing change in quality of life.  PHQ-9: Review Flowsheet  More data may exist      12/19/2021 11/23/2021 10/19/2021 10/17/2021 09/08/2021  Depression screen PHQ 2/9  Decreased Interest 0 _0 Down, Depressed, Hopeless _1 PHQ - 2 Score _2 Altered sleeping _3 Tired, decreased energy _4 Change in appetite _5 Feeling bad or failure about yourself  _6 Trouble concentrating _7 0  Moving slowly or fidgety/restless 0 0 0 0 0  Suicidal thoughts 0 0 0 1 0  PHQ-9 Score _8 Difficult doing work/chores Somewhat difficult Somewhat difficult Somewhat difficult Somewhat difficult Not difficult at all   Interpretation of Total Score  Total Score Depression Severity:  1-4 = Minimal depression, 5-9 = Mild depression, 10-14 = Moderate depression, 15-19 = Moderately severe depression, 20-27 = Severe depression   Psychosocial Evaluation and Intervention:  Psychosocial Evaluation - 10/06/21 1326       Psychosocial Evaluation & Interventions   Interventions Encouraged to exercise with the program and follow exercise  prescription    Comments Kennyth Lose has no barriers to attending the program. She does have stress at home in that her husband requires fulltime care. They do have help for his care so she can have time to take care of herself. She has a daughter and two grandkids that live in [place] as her support. She is ready to get started with the program in hope that she will gain more strentgh and stamina to be able not to tire out every afternoon.    Expected Outcomes STG Kennyth Lose will attend all scheduled sessions, she will have time to work on her health and stamina. LTG Kennyth Lose will be able to continue to care for herself maintaining the program she started here at her home    Continue Psychosocial Services  Follow up required by staff             Psychosocial Re-Evaluation:  Psychosocial Re-Evaluation     [place] Name 11/16/21 1543 12/12/21 0957 12/19/21 1008         Psychosocial Re-Evaluation   Current issues with Current Stress Concerns;Current Sleep Concerns Current Stress Concerns;Current Sleep Concerns Current Stress Concerns;Current Depression;Current Anxiety/Panic     Comments Patient states that she does have some concerns with stress related to being her husband's caregiver. She also states that she has had some trouble falling asleep and staying asleep at night. She states that reading is her avenue for stress relief. She reports that rehab  has been good for her because it helps her to feel better. Leanor continues to care for her husband as a primary caregiver.  She has a Actuary for classes.  Her doctor has changed her meds and she will pick them up soon.  She is still getting 3-4 hrs of sleep.  She seems to wake every hour and then sometimes can't get to sleep.  When she tries to exercise at home, her husband seems to always need something.  She feels that exercise has been helping her sleep as she is able to calm down easier in the evenings she exercises. Illeana repeated her PHQ today. Her score  improved by 3 pts.  She is still struggling with sleep and fatigue on top of caring for her husband and dealing with her personal health too.  She continues to try to stay positive     Expected Outcomes Short: Continue to read and attend exercise for stress relief. Log: Maintain a positive outlook. Short; Continue to exercise for mental boost Long: conitnue to use sitter to give her some self care time Short; Continue to exercise for mental boost Long: conitnue to use sitter to give her some self care time     Interventions Encouraged to attend Cardiac Rehabilitation for the exercise Encouraged to attend Cardiac Rehabilitation for the exercise;Stress management education Encouraged to attend Cardiac Rehabilitation for the exercise;Stress management education     Continue Psychosocial Services  Follow up required by staff -- Follow up required by staff       Initial Review   Source of Stress Concerns Family -- --     Comments Stress with husband needing total care.  Does have helpers every day so she can do what she needs to do. -- --              Psychosocial Discharge (Final Psychosocial Re-Evaluation):  Psychosocial Re-Evaluation - 12/19/21 1008       Psychosocial Re-Evaluation   Current issues with Current Stress Concerns;Current Depression;Current Anxiety/Panic    Comments Dawnmarie repeated her PHQ today. Her score improved by 3 pts.  She is still struggling with sleep and fatigue on top of caring for her husband and dealing with her personal health too.  She continues to try to stay positive    Expected Outcomes Short; Continue to exercise for mental boost Long: conitnue to use sitter to give her some self care time    Interventions Encouraged to attend Cardiac Rehabilitation for the exercise;Stress management education    Continue Psychosocial Services  Follow up required by staff             Vocational Rehabilitation: Provide vocational rehab assistance to qualifying  candidates.   Vocational Rehab Evaluation & Intervention:   Education: Education Goals: Education classes will be provided on a variety of topics geared toward better understanding of heart health and risk factor modification. Participant will state understanding/return demonstration of topics presented as noted by education test scores.  Learning Barriers/Preferences:   General Cardiac Education Topics:  AED/CPR: - Group verbal and written instruction with the use of models to demonstrate the basic use of the AED with the basic ABC's of resuscitation.   Anatomy and Cardiac Procedures: - Group verbal and visual presentation and models provide information about basic cardiac anatomy and function. Reviews the testing methods done to diagnose heart disease and the outcomes of the test results. Describes the treatment choices: Medical Management, Angioplasty, or Coronary Bypass Surgery for treating various heart  conditions including Myocardial Infarction, Angina, Valve Disease, and Cardiac Arrhythmias.  Written material given at graduation.   Medication Safety: - Group verbal and visual instruction to review commonly prescribed medications for heart and lung disease. Reviews the medication, class of the drug, and side effects. Includes the steps to properly store meds and maintain the prescription regimen.  Written material given at graduation.   Intimacy: - Group verbal instruction through game format to discuss how heart and lung disease can affect sexual intimacy. Written material given at graduation..   Know Your Numbers and Heart Failure: - Group verbal and visual instruction to discuss disease risk factors for cardiac and pulmonary disease and treatment options.  Reviews associated critical values for Overweight/Obesity, Hypertension, Cholesterol, and Diabetes.  Discusses basics of heart failure: signs/symptoms and treatments.  Introduces Heart Failure Zone chart for action plan for  heart failure.  Written material given at graduation.   Infection Prevention: - Provides verbal and written material to individual with discussion of infection control including proper hand washing and proper equipment cleaning during exercise session. Flowsheet Row Cardiac Rehab from 10/17/2021 in [facility] Cardiac and Pulmonary Rehab  Date 10/17/21  Educator [facility]  Instruction Review Code 1- Verbalizes Understanding       Falls Prevention: - Provides verbal and written material to individual with discussion of falls prevention and safety. Flowsheet Row Cardiac Rehab from 10/17/2021 in [facility] Cardiac and Pulmonary Rehab  Date 10/17/21  Educator [facility]  Instruction Review Code 1- Verbalizes Understanding       Other: -Provides group and verbal instruction on various topics (see comments)   Knowledge Questionnaire Score:  Knowledge Questionnaire Score - 10/17/21 1210       Knowledge Questionnaire Score   Pre Score 24/26             Core Components/Risk Factors/Patient Goals at Admission:  Personal Goals and Risk Factors at Admission - 10/17/21 1208       Core Components/Risk Factors/Patient Goals on Admission    Weight Management Yes    Intervention Weight Management: Develop a combined nutrition and exercise program designed to reach desired caloric intake, while maintaining appropriate intake of nutrient and fiber, sodium and fats, and appropriate energy expenditure required for the weight goal.;Weight Management: Provide education and appropriate resources to help participant work on and attain dietary goals.    Admit Weight 156 lb 6.4 oz (70.9 kg)    Goal Weight: Short Term 150 lb (68 kg)    Goal Weight: Long Term 150 lb (68 kg)    Expected Outcomes Short Term: Continue to assess and modify interventions until short term weight is achieved;Long Term: Adherence to nutrition and physical activity/exercise program aimed toward attainment of established weight goal;Weight  Maintenance: Understanding of the daily nutrition guidelines, which includes 25-35% calories from fat, 7% or less cal from saturated fats, less than $RemoveB'200mg'PLrVOPJg$  cholesterol, less than 1.5gm of sodium, & 5 or more servings of fruits and vegetables daily;Understanding recommendations for meals to include 15-35% energy as protein, 25-35% energy from fat, 35-60% energy from carbohydrates, less than $RemoveB'200mg'TiVCodBL$  of dietary cholesterol, 20-35 gm of total fiber daily;Understanding of distribution of calorie intake throughout the day with the consumption of 4-5 meals/snacks    Heart Failure Yes    Intervention Provide a combined exercise and nutrition program that is supplemented with education, support and counseling about heart failure. Directed toward relieving symptoms such as shortness of breath, decreased exercise tolerance, and extremity edema.    Expected Outcomes Improve  functional capacity of life;Short term: Attendance in program 2-3 days a week with increased exercise capacity. Reported lower sodium intake. Reported increased fruit and vegetable intake. Reports medication compliance.;Short term: Daily weights obtained and reported for increase. Utilizing diuretic protocols set by physician.;Long term: Adoption of self-care skills and reduction of barriers for early signs and symptoms recognition and intervention leading to self-care maintenance.    Lipids Yes    Intervention Provide education and support for participant on nutrition & aerobic/resistive exercise along with prescribed medications to achieve LDL <40m, HDL >460m    Expected Outcomes Short Term: Participant states understanding of desired cholesterol values and is compliant with medications prescribed. Participant is following exercise prescription and nutrition guidelines.;Long Term: Cholesterol controlled with medications as prescribed, with individualized exercise RX and with personalized nutrition plan. Value goals: LDL < 7020mHDL > 40 mg.              Education:Diabetes - Individual verbal and written instruction to review signs/symptoms of diabetes, desired ranges of glucose level fasting, after meals and with exercise. Acknowledge that pre and post exercise glucose checks will be done for 3 sessions at entry of program.   Core Components/Risk Factors/Patient Goals Review:   Goals and Risk Factor Review     Row Name 11/16/21 1549 12/12/21 1002           Core Components/Risk Factors/Patient Goals Review   Personal Goals Review Hypertension;Lipids;Weight Management/Obesity Hypertension;Lipids;Weight Management/Obesity      Review Patient does have a BP cuff at home, but has not been checking her BP at home on a regular basis. She was informed that this would be a good way to make sure she is within her normal range. She is happy with her current weight but wouldn't mind losing a few pounds. We spoke about how exercising more regularly at home in addition to rehab could help with weight loss. JacRidhi doing well in rehab. Her weight is holding steady (up 2-3 down 2-3 lb).  She is feeling pretty good overall.  Her pressures are doing well in class, but she is not checking them at home.  She just had a med change, but has not picked it up yet.  She is going to start a new BP med today and doctor warned her about feeling lightheaded with it.  She will bring it in on Thursday.      Expected Outcomes Short: begin checking BP at home. Long: continue to attend rehab and exercise at home for weight loss. Short; Start new med Long; continue to montior risk factors.               Core Components/Risk Factors/Patient Goals at Discharge (Final Review):   Goals and Risk Factor Review - 12/12/21 1002       Core Components/Risk Factors/Patient Goals Review   Personal Goals Review Hypertension;Lipids;Weight Management/Obesity    Review JacJenan doing well in rehab. Her weight is holding steady (up 2-3 down 2-3 lb).  She is feeling pretty  good overall.  Her pressures are doing well in class, but she is not checking them at home.  She just had a med change, but has not picked it up yet.  She is going to start a new BP med today and doctor warned her about feeling lightheaded with it.  She will bring it in on Thursday.    Expected Outcomes Short; Start new med Long; continue to montior risk factors.  ITP Comments:  ITP Comments     Row Name 10/06/21 1331 10/17/21 1159 10/24/21 0956 11/08/21 0955 11/29/21 1148   ITP Comments Virtual orientation call completed today. shehas an appointment on Date: 10/17/2021  for EP eval and gym Orientation.  Documentation of diagnosis can be found in [facility] Date: 07/17/2021 . Completed 6MWT and gym orientation. Initial ITP created and sent for review to Dr. Emily Filbert, Medical Director. First full day of exercise!  Patient was oriented to gym and equipment including functions, settings, policies, and procedures.  Patient's individual exercise prescription and treatment plan were reviewed.  All starting workloads were established based on the results of the 6 minute walk test done at initial orientation visit.  The plan for exercise progression was also introduced and progression will be customized based on patient's performance and goals. 30 Day review completed. Medical Director ITP review done, changes made as directed, and signed approval by Medical Director. Completed initial RD consultation    [place] Name 12/06/21 0755 01/03/22 0754         ITP Comments 30 Day review completed. Medical Director ITP review done, changes made as directed, and signed approval by Medical Director. 30 Day review completed. Medical Director ITP review done, changes made as directed, and signed approva30 Day review completed. Medical Director ITP review done, changes made as directed, and signed approval by Medical Director.l by Medical Director.               Comments: 30 day review

## 2022-01-03 NOTE — Progress Notes (Unsigned)
30 Day review completed. Medical Director ITP review done, changes made as directed, and signed approva30 Day review completed. Medical Director ITP review done, changes made as directed, and signed approval by Medical Director.l by Medical Director.

## 2022-01-04 ENCOUNTER — Ambulatory Visit: Payer: Medicare Other | Admitting: Nurse Practitioner

## 2022-01-04 ENCOUNTER — Ambulatory Visit: Admit: 2022-01-04 | Discharge: 2022-01-05 | Payer: MEDICARE

## 2022-01-04 DIAGNOSIS — M059 Rheumatoid arthritis with rheumatoid factor, unspecified: Principal | ICD-10-CM

## 2022-01-04 DIAGNOSIS — E559 Vitamin D deficiency, unspecified: Principal | ICD-10-CM

## 2022-01-04 DIAGNOSIS — M8589 Other specified disorders of bone density and structure, multiple sites: Principal | ICD-10-CM

## 2022-01-04 DIAGNOSIS — M153 Secondary multiple arthritis: Principal | ICD-10-CM

## 2022-01-04 DIAGNOSIS — M546 Pain in thoracic spine: Principal | ICD-10-CM

## 2022-01-04 DIAGNOSIS — Z79631 Methotrexate, long term, current use: Principal | ICD-10-CM

## 2022-01-04 MED ORDER — BD TUBERCULIN SYRINGE 1 ML 27 X 1/2"
3 refills | 84 days | Status: CP
Start: 2022-01-04 — End: ?

## 2022-01-04 MED ORDER — TRAMADOL 50 MG TABLET
ORAL_TABLET | Freq: Four times a day (QID) | ORAL | 5 refills | 23 days | Status: CP | PRN
Start: 2022-01-04 — End: ?

## 2022-01-09 ENCOUNTER — Encounter: Payer: Medicare Other | Admitting: *Deleted

## 2022-01-09 DIAGNOSIS — I214 Non-ST elevation (NSTEMI) myocardial infarction: Secondary | ICD-10-CM

## 2022-01-09 DIAGNOSIS — Z955 Presence of coronary angioplasty implant and graft: Secondary | ICD-10-CM

## 2022-01-09 NOTE — Progress Notes (Signed)
Daily Session Note  Patient Details  Name: Veronica Romero MRN: [id number] Date of Birth: August 06, 1950 Referring Provider:   Flowsheet Row Cardiac Rehab from 10/17/2021 in [facility] Cardiac and Pulmonary Rehab  Referring Provider Berniece Salines       Encounter Date: 01/09/2022  Check In:  Session Check In - 01/09/22 1030       Check-In   Supervising physician immediately available to respond to emergencies See telemetry face sheet for immediately available ER MD    Location [facility]-Cardiac & Pulmonary Rehab    Staff Present Heath Lark, RN, BSN, Jacklynn Bue, MS, ASCM CEP, Exercise Physiologist;Noah Tickle, BS, Exercise Physiologist;Meredith Sherryll Burger, RN BSN    Virtual Visit No    Medication changes reported     No    Fall or balance concerns reported    No    Warm-up and Cool-down Performed on first and last piece of equipment    Resistance Training Performed Yes    VAD Patient? No    PAD/SET Patient? No      Pain Assessment   Currently in Pain? No/denies                Social History   Tobacco Use  Smoking Status Never  Smokeless Tobacco Never    Goals Met:  Independence with exercise equipment Exercise tolerated well No report of concerns or symptoms today  Goals Unmet:  Not Applicable  Comments: Pt able to follow exercise prescription today without complaint.  Will continue to monitor for progression.    Dr. Emily Filbert is Medical Director for [place].  Dr. Ottie Glazier is Medical Director for [facility] Pulmonary Rehabilitation.

## 2022-01-11 ENCOUNTER — Encounter: Payer: Medicare Other | Admitting: *Deleted

## 2022-01-11 DIAGNOSIS — I214 Non-ST elevation (NSTEMI) myocardial infarction: Secondary | ICD-10-CM

## 2022-01-11 DIAGNOSIS — Z955 Presence of coronary angioplasty implant and graft: Secondary | ICD-10-CM | POA: Diagnosis not present

## 2022-01-11 NOTE — Progress Notes (Signed)
Daily Session Note  Patient Details  Name: ARRAYAH CONNORS MRN: [id number] Date of Birth: [birth year] Referring Provider:   Flowsheet Row Cardiac Rehab from 10/17/2021 in [facility] Cardiac and Pulmonary Rehab  Referring Provider Berniece Salines       Encounter Date: 01/11/2022  Check In:  Session Check In - 01/11/22 1611       Check-In   Supervising physician immediately available to respond to emergencies See telemetry face sheet for immediately available ER MD    Location [facility]-Cardiac & Pulmonary Rehab    Staff Present Renita Papa, RN BSN;Joseph [place], [place];Heath Lark, RN, BSN, CCRP    Virtual Visit No    Medication changes reported     No    Fall or balance concerns reported    No    Warm-up and Cool-down Performed on first and last piece of equipment    Resistance Training Performed Yes    VAD Patient? No    PAD/SET Patient? No      Pain Assessment   Currently in Pain? No/denies                Social History   Tobacco Use  Smoking Status Never  Smokeless Tobacco Never    Goals Met:  Independence with exercise equipment Exercise tolerated well No report of concerns or symptoms today Strength training completed today  Goals Unmet:  Not Applicable  Comments: Pt able to follow exercise prescription today without complaint.  Will continue to monitor for progression.    Dr. Emily Filbert is Medical Director for [place].  Dr. Ottie Glazier is Medical Director for [facility] Pulmonary Rehabilitation.

## 2022-01-12 ENCOUNTER — Other Ambulatory Visit: Payer: PRIVATE HEALTH INSURANCE

## 2022-01-14 ENCOUNTER — Other Ambulatory Visit (HOSPITAL_COMMUNITY): Payer: Self-pay

## 2022-01-14 ENCOUNTER — Other Ambulatory Visit: Payer: Self-pay | Admitting: Cardiology

## 2022-01-15 ENCOUNTER — Other Ambulatory Visit (HOSPITAL_COMMUNITY): Payer: Self-pay

## 2022-01-15 MED ORDER — ROSUVASTATIN CALCIUM 40 MG PO TABS
40.0000 mg | ORAL_TABLET | Freq: Every day | ORAL | 3 refills | Status: DC
  Filled 2022-01-15: qty 90, 90d supply, fill #0
  Filled 2022-04-11: qty 90, 90d supply, fill #1
  Filled 2022-07-09: qty 90, 90d supply, fill #2

## 2022-01-16 ENCOUNTER — Encounter: Payer: Medicare Other | Admitting: *Deleted

## 2022-01-16 DIAGNOSIS — Z955 Presence of coronary angioplasty implant and graft: Secondary | ICD-10-CM

## 2022-01-16 DIAGNOSIS — I214 Non-ST elevation (NSTEMI) myocardial infarction: Secondary | ICD-10-CM

## 2022-01-16 NOTE — Progress Notes (Signed)
Daily Session Note  Patient Details  Name: Veronica Romero MRN: [id number] Date of Birth: 11/29/1950 Referring Provider:   Flowsheet Row Cardiac Rehab from 10/17/2021 in [facility] Cardiac and Pulmonary Rehab  Referring Provider Berniece Salines       Encounter Date: 01/16/2022  Check In:  Session Check In - 01/16/22 1032       Check-In   Supervising physician immediately available to respond to emergencies See telemetry face sheet for immediately available ER MD    Location [facility]-Cardiac & Pulmonary Rehab    Staff Present Heath Lark, RN, BSN, CCRP;Meredith Sherryll Burger, RN BSN;Noah Tickle, BS, Exercise Physiologist;Jessica [place], MA, RCEP, CCRP, CCET    Virtual Visit No    Medication changes reported     No    Fall or balance concerns reported    No    Warm-up and Cool-down Performed on first and last piece of equipment    Resistance Training Performed Yes    VAD Patient? No    PAD/SET Patient? No      Pain Assessment   Currently in Pain? No/denies                Social History   Tobacco Use  Smoking Status Never  Smokeless Tobacco Never    Goals Met:  Independence with exercise equipment Exercise tolerated well No report of concerns or symptoms today  Goals Unmet:  Not Applicable  Comments: Pt able to follow exercise prescription today without complaint.  Will continue to monitor for progression.    Dr. Emily Filbert is Medical Director for [place].  Dr. Ottie Glazier is Medical Director for [facility] Pulmonary Rehabilitation.

## 2022-01-16 NOTE — Progress Notes (Unsigned)
There were no vitals taken for this visit.   Subjective:    Patient ID: Veronica Romero, female    DOB: 1950/08/19, 71 y.o.   MRN: [id number]  HPI: Veronica Romero is a 71 y.o. female  No chief complaint on file.  Patient states she is doing well.  Feeling a lot better.  She is no longer using her oxygen during the day.  States she does use it at night some.  She is following up with pulmonology next week.  Denies HA, CP, SOB, dizziness, palpitations, visual changes, and lower extremity swelling.   Patient states she is having a foot issues. Her right foot is having pain for about 1 week.  Denies discharge and fever.  HEART FAILURE    ANEMIA Anemia status: controlled Etiology of anemia: Duration of anemia treatment:  Compliance with treatment:  no current medication regimen Iron supplementation side effects: no Severity of anemia: severe Fatigue: no Decreased exercise tolerance: no  Dyspnea on exertion: no Palpitations: no Bleeding: no Pica: no  Relevant past medical, surgical, family and social history reviewed and updated as indicated. Interim medical history since our last visit reviewed. Allergies and medications reviewed and updated.  Review of Systems  Eyes:  Negative for visual disturbance.  Respiratory:  Negative for cough, chest tightness and shortness of breath.   Cardiovascular:  Negative for chest pain, palpitations and leg swelling.  Skin:        Painful callus  Neurological:  Negative for dizziness and headaches.    Per HPI unless specifically indicated above     Objective:    There were no vitals taken for this visit.  Wt Readings from Last 3 Encounters:  12/18/21 161 lb 3.2 oz (73.1 kg)  12/11/21 158 lb 3.2 oz (71.8 kg)  10/17/21 156 lb 6.4 oz (70.9 kg)    Physical Exam Vitals and nursing note reviewed.  Constitutional:      General: She is not in acute distress.    Appearance: Normal appearance. She is normal weight. She is not  ill-appearing, toxic-appearing or diaphoretic.  HENT:     Head: Normocephalic.     Right Ear: External ear normal.     Left Ear: External ear normal.     Nose: Nose normal.     Mouth/Throat:     Mouth: Mucous membranes are moist.     Pharynx: Oropharynx is clear.  Eyes:     General:        Right eye: No discharge.        Left eye: No discharge.     Extraocular Movements: Extraocular movements intact.     Conjunctiva/sclera: Conjunctivae normal.     Pupils: Pupils are equal, round, and reactive to light.  Cardiovascular:     Rate and Rhythm: Normal rate and regular rhythm.     Heart sounds: No murmur heard. Pulmonary:     Effort: Pulmonary effort is normal. No respiratory distress.     Breath sounds: Normal breath sounds. No wheezing or rales.  Musculoskeletal:     Cervical back: Normal range of motion and neck supple.       Feet:  Skin:    General: Skin is warm and dry.     Capillary Refill: Capillary refill takes less than 2 seconds.  Neurological:     General: No focal deficit present.     Mental Status: She is alert and oriented to person, place, and time. Mental status is at baseline.  Psychiatric:        Mood and Affect: Mood normal.        Behavior: Behavior normal.        Thought Content: Thought content normal.        Judgment: Judgment normal.     Results for orders placed or performed in visit on 11/07/21  ECHOCARDIOGRAM COMPLETE  Result Value Ref Range   Area-P 1/2 3.85 cm2   S' Lateral 3.80 cm   P 1/2 time 293 msec      Assessment & Plan:   Problem List Items Addressed This Visit   None    Follow up plan: No follow-ups on file.

## 2022-01-17 ENCOUNTER — Encounter: Payer: Self-pay | Admitting: Nurse Practitioner

## 2022-01-17 ENCOUNTER — Ambulatory Visit (INDEPENDENT_AMBULATORY_CARE_PROVIDER_SITE_OTHER): Payer: Medicare Other | Admitting: Nurse Practitioner

## 2022-01-17 VITALS — BP 126/70 | HR 88 | Temp 97.8°F | Wt 166.9 lb

## 2022-01-17 DIAGNOSIS — I700 Atherosclerosis of aorta: Secondary | ICD-10-CM

## 2022-01-17 DIAGNOSIS — D649 Anemia, unspecified: Secondary | ICD-10-CM | POA: Insufficient documentation

## 2022-01-17 DIAGNOSIS — I255 Ischemic cardiomyopathy: Secondary | ICD-10-CM

## 2022-01-17 DIAGNOSIS — Z1231 Encounter for screening mammogram for malignant neoplasm of breast: Secondary | ICD-10-CM

## 2022-01-17 DIAGNOSIS — M0579 Rheumatoid arthritis with rheumatoid factor of multiple sites without organ or systems involvement: Secondary | ICD-10-CM

## 2022-01-17 DIAGNOSIS — Z1159 Encounter for screening for other viral diseases: Secondary | ICD-10-CM

## 2022-01-17 DIAGNOSIS — I2510 Atherosclerotic heart disease of native coronary artery without angina pectoris: Secondary | ICD-10-CM

## 2022-01-17 DIAGNOSIS — I5020 Unspecified systolic (congestive) heart failure: Secondary | ICD-10-CM | POA: Diagnosis not present

## 2022-01-17 DIAGNOSIS — E782 Mixed hyperlipidemia: Secondary | ICD-10-CM | POA: Diagnosis not present

## 2022-01-17 DIAGNOSIS — Z789 Other specified health status: Secondary | ICD-10-CM

## 2022-01-17 NOTE — Assessment & Plan Note (Signed)
Chronic. Diagnosed when she was 38.  On Prednisone 10mg  daily, Methotrexate injection weekly, and Humira Injection weekly.  Followed by Rheumatology at [facility].  Rheumatology is working to decrease her daily prednisone.  Continue to follow per their recommendations.

## 2022-01-17 NOTE — Assessment & Plan Note (Signed)
Labs ordered at visit today.  Will make recommendations based on lab results.   

## 2022-01-17 NOTE — Assessment & Plan Note (Signed)
Drinking 3-4 glasses of wine nightly.  Encouraged cessation.

## 2022-01-17 NOTE — Assessment & Plan Note (Signed)
Chronic.  Controlled.  Continue with current medication regimen of Crestor 40mg daily.  Labs ordered today.  Return to clinic in 6 months for reevaluation.  Call sooner if concerns arise.   

## 2022-01-17 NOTE — Assessment & Plan Note (Signed)
Observed on CT. Continue with ASA, Plavix and Crestor. Continue to follow up with Cardiology.

## 2022-01-17 NOTE — Assessment & Plan Note (Signed)
Chronic. Followed by Cardiology. Has been in Cardiac rehab but will soon be discharged.  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  Patient not sure whether she is taking Losartan or Entresto or both.  Instructed patient to stop Losartan and continue Entresto.  Please call and let us know if she needs refills. Labs ordered today. Follow up in 6 months.

## 2022-01-18 ENCOUNTER — Encounter: Payer: Medicare Other | Admitting: *Deleted

## 2022-01-18 DIAGNOSIS — Z955 Presence of coronary angioplasty implant and graft: Secondary | ICD-10-CM | POA: Diagnosis not present

## 2022-01-18 DIAGNOSIS — I214 Non-ST elevation (NSTEMI) myocardial infarction: Secondary | ICD-10-CM

## 2022-01-18 LAB — COMPREHENSIVE METABOLIC PANEL
ALT: 20 IU/L (ref 0–32)
AST: 30 IU/L (ref 0–40)
Albumin/Globulin Ratio: 1.4 (ref 1.2–2.2)
Albumin: 4.2 g/dL (ref 3.8–4.8)
Alkaline Phosphatase: 56 IU/L (ref 44–121)
BUN/Creatinine Ratio: 27 (ref 12–28)
BUN: 20 mg/dL (ref 8–27)
Bilirubin Total: <0.2 mg/dL (ref 0.0–1.2)
CO2: 22 mmol/L (ref 20–29)
Calcium: 8.6 mg/dL — ABNORMAL LOW (ref 8.7–10.3)
Chloride: 106 mmol/L (ref 96–106)
Creatinine, Ser: 0.74 mg/dL (ref 0.57–1.00)
Globulin, Total: 3 g/dL (ref 1.5–4.5)
Glucose: 83 mg/dL (ref 70–99)
Potassium: 4.4 mmol/L (ref 3.5–5.2)
Sodium: 140 mmol/L (ref 134–144)
Total Protein: 7.2 g/dL (ref 6.0–8.5)
eGFR: 86 mL/min/{1.73_m2} (ref >59)

## 2022-01-18 LAB — CBC WITH DIFFERENTIAL/PLATELET
Basophils Absolute: 0.1 10*3/uL (ref 0.0–0.2)
Basos: 1 %
EOS (ABSOLUTE): 0.1 10*3/uL (ref 0.0–0.4)
Eos: 2 %
Hematocrit: 27 % — ABNORMAL LOW (ref 34.0–46.6)
Hemoglobin: 8.2 g/dL — ABNORMAL LOW (ref 11.1–15.9)
Immature Grans (Abs): 0 10*3/uL (ref 0.0–0.1)
Immature Granulocytes: 0 %
Lymphocytes Absolute: 3.1 10*3/uL (ref 0.7–3.1)
Lymphs: 32 %
MCH: 28.1 pg (ref 26.6–33.0)
MCHC: 30.4 g/dL — ABNORMAL LOW (ref 31.5–35.7)
MCV: 93 fL (ref 79–97)
Monocytes Absolute: 1.2 10*3/uL — ABNORMAL HIGH (ref 0.1–0.9)
Monocytes: 12 %
Neutrophils Absolute: 5.1 10*3/uL (ref 1.4–7.0)
Neutrophils: 53 %
Platelets: 269 10*3/uL (ref 150–450)
RBC: 2.92 x10E6/uL — ABNORMAL LOW (ref 3.77–5.28)
RDW: 17.1 % — ABNORMAL HIGH (ref 11.7–15.4)
WBC: 9.6 10*3/uL (ref 3.4–10.8)

## 2022-01-18 LAB — LIPID PANEL
Chol/HDL Ratio: 1.9 ratio (ref 0.0–4.4)
Cholesterol, Total: 172 mg/dL (ref 100–199)
HDL: 91 mg/dL (ref >39)
LDL Chol Calc (NIH): 64 mg/dL (ref 0–99)
Triglycerides: 96 mg/dL (ref 0–149)
VLDL Cholesterol Cal: 17 mg/dL (ref 5–40)

## 2022-01-18 LAB — HEPATITIS C ANTIBODY: Hep C Virus Ab: NONREACTIVE

## 2022-01-18 NOTE — Progress Notes (Signed)
Daily Session Note  Patient Details  Name: Veronica Romero MRN: [id number] Date of Birth: 12/22/1950 Referring Provider:   Flowsheet Row Cardiac Rehab from 10/17/2021 in [facility] Cardiac and Pulmonary Rehab  Referring Provider Kardie Tobb       Encounter Date: 01/18/2022  Check In:  Session Check In - 01/18/22 1542       Check-In   Supervising physician immediately available to respond to emergencies See telemetry face sheet for immediately available ER MD    Location [facility]-Cardiac & Pulmonary Rehab    Staff Present Joseph Hood, RCP,RRT,BSRT;Jessica [place], MA, RCEP, CCRP, CCET;Meredith Craven, RN BSN    Virtual Visit No    Medication changes reported     No    Fall or balance concerns reported    No    Tobacco Cessation No Change    Warm-up and Cool-down Performed on first and last piece of equipment    Resistance Training Performed Yes    VAD Patient? No    PAD/SET Patient? No      Pain Assessment   Currently in Pain? No/denies                Social History   Tobacco Use  Smoking Status Never  Smokeless Tobacco Never    Goals Met:  Independence with exercise equipment Exercise tolerated well No report of concerns or symptoms today Strength training completed today  Goals Unmet:  Not Applicable  Comments: Pt able to follow exercise prescription today without complaint.  Will continue to monitor for progression.    Dr. Mark Rickey is Medical Director for [facility].  Dr. Fuad Aleskerov is Medical Director for [facility] Pulmonary Rehabilitation. 

## 2022-01-18 NOTE — Progress Notes (Signed)
Please let patient know that her lab work looks good.  She would benefit from an iron supplement.  I recommend Ferrous Sulfate 65mg  daily to help with her blood counts.  No other concerns at this time.  Follow up as discussed.

## 2022-01-22 ENCOUNTER — Ambulatory Visit: Payer: PRIVATE HEALTH INSURANCE | Attending: Pulmonary Disease

## 2022-01-23 ENCOUNTER — Encounter: Payer: Medicare Other | Admitting: *Deleted

## 2022-01-23 DIAGNOSIS — Z955 Presence of coronary angioplasty implant and graft: Secondary | ICD-10-CM | POA: Diagnosis not present

## 2022-01-23 DIAGNOSIS — I214 Non-ST elevation (NSTEMI) myocardial infarction: Secondary | ICD-10-CM

## 2022-01-23 NOTE — Progress Notes (Signed)
Daily Session Note  Patient Details  Name: Veronica Romero MRN: [id number] Date of Birth: Jan 13, 1951 Referring Provider:   Flowsheet Row Cardiac Rehab from 10/17/2021 in [facility] Cardiac and Pulmonary Rehab  Referring Provider Berniece Salines       Encounter Date: 01/23/2022  Check In:  Session Check In - 01/23/22 1048       Check-In   Supervising physician immediately available to respond to emergencies See telemetry face sheet for immediately available ER MD    Location [facility]-Cardiac & Pulmonary Rehab    Staff Present Heath Lark, RN, BSN, CCRP;Jessica [place], MA, RCEP, CCRP, CCET;Noah Tickle, BS, Exercise Physiologist;Meredith Sherryll Burger, RN BSN    Virtual Visit No    Medication changes reported     No    Fall or balance concerns reported    No    Warm-up and Cool-down Performed on first and last piece of equipment    Resistance Training Performed Yes    VAD Patient? No    PAD/SET Patient? No      Pain Assessment   Currently in Pain? No/denies                Social History   Tobacco Use  Smoking Status Never  Smokeless Tobacco Never    Goals Met:  Independence with exercise equipment Exercise tolerated well No report of concerns or symptoms today  Goals Unmet:  Not Applicable  Comments: Pt able to follow exercise prescription today without complaint.  Will continue to monitor for progression.    Dr. Emily Filbert is Medical Director for [place].  Dr. Ottie Glazier is Medical Director for [facility] Pulmonary Rehabilitation.

## 2022-01-25 ENCOUNTER — Encounter: Payer: Medicare Other | Admitting: *Deleted

## 2022-01-25 DIAGNOSIS — Z955 Presence of coronary angioplasty implant and graft: Secondary | ICD-10-CM | POA: Diagnosis not present

## 2022-01-25 DIAGNOSIS — I214 Non-ST elevation (NSTEMI) myocardial infarction: Secondary | ICD-10-CM

## 2022-01-25 NOTE — Progress Notes (Signed)
Daily Session Note  Patient Details  Name: Veronica Romero MRN: [id number] Date of Birth: [birth year] Referring Provider:   Flowsheet Row Cardiac Rehab from 10/17/2021 in [facility] Cardiac and Pulmonary Rehab  Referring Provider Berniece Salines       Encounter Date: 01/25/2022  Check In:  Session Check In - 01/25/22 1556       Check-In   Supervising physician immediately available to respond to emergencies See telemetry face sheet for immediately available ER MD    Location [facility]-Cardiac & Pulmonary Rehab    Staff Present Justin Mend, Lorre Nick, MA, RCEP, CCRP, CCET;Kiernan Atkerson Sherryll Burger, RN BSN    Virtual Visit No    Medication changes reported     No    Fall or balance concerns reported    No    Warm-up and Cool-down Performed on first and last piece of equipment    Resistance Training Performed Yes    VAD Patient? No    PAD/SET Patient? No      Pain Assessment   Currently in Pain? No/denies                Social History   Tobacco Use  Smoking Status Never  Smokeless Tobacco Never    Goals Met:  Independence with exercise equipment Exercise tolerated well No report of concerns or symptoms today Strength training completed today  Goals Unmet:  Not Applicable  Comments: Pt able to follow exercise prescription today without complaint.  Will continue to monitor for progression.    Dr. Emily Filbert is Medical Director for [place].  Dr. Ottie Glazier is Medical Director for [facility] Pulmonary Rehabilitation.

## 2022-01-28 NOTE — Progress Notes (Unsigned)
   Patient ID: Veronica Romero, female    DOB: [birth year], 71 y.o.   MRN: [id number]  HPI  Ms Veronica Romero is a 71 y/o female with a history of  Echo report from 11/07/21 reviewed and showed an EF of 40-45% along with moderate LAE, moderate AR and mild aortic dilatation.   RHC/LHC done 06/16/21 and showed:   Ost LAD to Mid LAD lesion is 85% stenosed.   Prox RCA lesion is 50% stenosed.   LPAV lesion is 50% stenosed.   There is mild to moderate left ventricular systolic dysfunction.   LV end diastolic pressure is normal.   LV end diastolic pressure is normal.   The left ventricular ejection fraction is 35-45% by visual estimate.   Hemodynamic findings consistent with mild pulmonary hypertension.  CONCLUSIONS: Calcified segmental 85% proximal LAD Dominant circumflex without significant obstruction.  Distally before the PDA there is segmental 50% narrowing. Left main is widely patent Right coronary was poorly visualized, is nondominant, and contains mid 50% stenosis. Low normal to mild systolic dysfunction with EF in the 35 to 45% range.  LVEDP 14 mmHg. Mild pulmonary hypertension with mean PA pressure 22 mmHg; mean wedge pressure 14 mmHg; right atrial mean 6 mmHg.  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  Admitted 07/17/21 for stent placement proximal to mid LAD with orbital atherectomy and DES x 1 with OCT guidance. Discharged the following day.   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

## 2022-01-29 ENCOUNTER — Ambulatory Visit: Payer: Medicare Other | Attending: Family | Admitting: Family

## 2022-01-29 ENCOUNTER — Encounter: Payer: Self-pay | Admitting: Family

## 2022-01-29 ENCOUNTER — Other Ambulatory Visit: Payer: Self-pay | Admitting: Family

## 2022-01-29 ENCOUNTER — Other Ambulatory Visit: Payer: Self-pay | Admitting: Nurse Practitioner

## 2022-01-29 VITALS — BP 142/57 | HR 84 | Resp 20 | Ht 67.0 in | Wt 167.0 lb

## 2022-01-29 DIAGNOSIS — M05721 Rheumatoid arthritis with rheumatoid factor of right elbow without organ or systems involvement: Principal | ICD-10-CM

## 2022-01-29 DIAGNOSIS — M8589 Other specified disorders of bone density and structure, multiple sites: Principal | ICD-10-CM

## 2022-01-29 DIAGNOSIS — M05741 Rheumatoid arthritis with rheumatoid factor of right hand without organ or systems involvement: Principal | ICD-10-CM

## 2022-01-29 DIAGNOSIS — M0579 Rheumatoid arthritis with rheumatoid factor of multiple sites without organ or systems involvement: Principal | ICD-10-CM

## 2022-01-29 DIAGNOSIS — M059 Rheumatoid arthritis with rheumatoid factor, unspecified: Principal | ICD-10-CM

## 2022-01-29 DIAGNOSIS — M05722 Rheumatoid arthritis with rheumatoid factor of left elbow without organ or systems involvement: Principal | ICD-10-CM

## 2022-01-29 DIAGNOSIS — M05742 Rheumatoid arthritis with rheumatoid factor of left hand without organ or systems involvement: Principal | ICD-10-CM

## 2022-01-29 DIAGNOSIS — I110 Hypertensive heart disease with heart failure: Secondary | ICD-10-CM | POA: Diagnosis not present

## 2022-01-29 DIAGNOSIS — Z8249 Family history of ischemic heart disease and other diseases of the circulatory system: Secondary | ICD-10-CM | POA: Diagnosis not present

## 2022-01-29 DIAGNOSIS — Z955 Presence of coronary angioplasty implant and graft: Secondary | ICD-10-CM | POA: Diagnosis not present

## 2022-01-29 DIAGNOSIS — M79673 Pain in unspecified foot: Secondary | ICD-10-CM | POA: Diagnosis not present

## 2022-01-29 DIAGNOSIS — Z79899 Other long term (current) drug therapy: Secondary | ICD-10-CM | POA: Insufficient documentation

## 2022-01-29 DIAGNOSIS — I10 Essential (primary) hypertension: Secondary | ICD-10-CM | POA: Diagnosis not present

## 2022-01-29 DIAGNOSIS — I255 Ischemic cardiomyopathy: Secondary | ICD-10-CM | POA: Diagnosis not present

## 2022-01-29 DIAGNOSIS — I5020 Unspecified systolic (congestive) heart failure: Secondary | ICD-10-CM

## 2022-01-29 DIAGNOSIS — J8410 Pulmonary fibrosis, unspecified: Secondary | ICD-10-CM

## 2022-01-29 DIAGNOSIS — I2510 Atherosclerotic heart disease of native coronary artery without angina pectoris: Secondary | ICD-10-CM | POA: Diagnosis not present

## 2022-01-29 DIAGNOSIS — M069 Rheumatoid arthritis, unspecified: Secondary | ICD-10-CM | POA: Insufficient documentation

## 2022-01-29 DIAGNOSIS — E119 Type 2 diabetes mellitus without complications: Secondary | ICD-10-CM

## 2022-01-29 DIAGNOSIS — I2722 Pulmonary hypertension due to left heart disease: Secondary | ICD-10-CM | POA: Diagnosis not present

## 2022-01-29 DIAGNOSIS — F419 Anxiety disorder, unspecified: Secondary | ICD-10-CM | POA: Insufficient documentation

## 2022-01-29 DIAGNOSIS — I5022 Chronic systolic (congestive) heart failure: Secondary | ICD-10-CM | POA: Diagnosis present

## 2022-01-29 MED ORDER — PREDNISONE 5 MG TABLET
ORAL_TABLET | 2 refills | 0 days
Start: 2022-01-29 — End: ?

## 2022-01-29 MED ORDER — DAPAGLIFLOZIN PROPANEDIOL 10 MG PO TABS: 10.0000 mg | ORAL_TABLET | Freq: Every day | ORAL | 5 refills | Status: DC

## 2022-01-29 MED ORDER — DAPAGLIFLOZIN PROPANEDIOL 10 MG PO TABS: 10.0000 mg | ORAL_TABLET | Freq: Every day | ORAL | 3 refills | Status: DC

## 2022-01-29 NOTE — Patient Instructions (Addendum)
Continue weighing daily and call for an overnight weight gain of 3 pounds or more or a weekly weight gain of more than 5 pounds.   If you have voicemail, please make sure your mailbox is cleaned out so that we may leave a message and please make sure to listen to any voicemails.    Decrease fluid pill to 1/2 tablet every other day. If you notice increased weight or worsening swelling, resume either a whole tablet every other day or take 1/2 tablet every day   Begin farxiga as 1 tablet every morning. We will try to get patient assistance for this medications.

## 2022-01-30 ENCOUNTER — Encounter: Payer: Medicare Other | Attending: Cardiology | Admitting: *Deleted

## 2022-01-30 ENCOUNTER — Other Ambulatory Visit (HOSPITAL_COMMUNITY): Payer: Self-pay

## 2022-01-30 DIAGNOSIS — M05741 Rheumatoid arthritis with rheumatoid factor of right hand without organ or systems involvement: Principal | ICD-10-CM

## 2022-01-30 DIAGNOSIS — M059 Rheumatoid arthritis with rheumatoid factor, unspecified: Principal | ICD-10-CM

## 2022-01-30 DIAGNOSIS — M05742 Rheumatoid arthritis with rheumatoid factor of left hand without organ or systems involvement: Principal | ICD-10-CM

## 2022-01-30 DIAGNOSIS — M8589 Other specified disorders of bone density and structure, multiple sites: Principal | ICD-10-CM

## 2022-01-30 DIAGNOSIS — M0579 Rheumatoid arthritis with rheumatoid factor of multiple sites without organ or systems involvement: Principal | ICD-10-CM

## 2022-01-30 DIAGNOSIS — M05721 Rheumatoid arthritis with rheumatoid factor of right elbow without organ or systems involvement: Principal | ICD-10-CM

## 2022-01-30 DIAGNOSIS — M05722 Rheumatoid arthritis with rheumatoid factor of left elbow without organ or systems involvement: Principal | ICD-10-CM

## 2022-01-30 DIAGNOSIS — Z955 Presence of coronary angioplasty implant and graft: Secondary | ICD-10-CM | POA: Diagnosis not present

## 2022-01-30 DIAGNOSIS — I214 Non-ST elevation (NSTEMI) myocardial infarction: Secondary | ICD-10-CM | POA: Insufficient documentation

## 2022-01-30 MED ORDER — PREDNISONE 5 MG TABLET
ORAL_TABLET | 2 refills | 0.00000 days
Start: 2022-01-30 — End: ?

## 2022-01-30 NOTE — Progress Notes (Signed)
Daily Session Note  Patient Details  Name: Veronica Romero MRN: [id number] Date of Birth: [birth year] Referring Provider:   Flowsheet Row Cardiac Rehab from 10/17/2021 in [facility] Cardiac and Pulmonary Rehab  Referring Provider Berniece Salines       Encounter Date: 01/30/2022  Check In:  Session Check In - 01/30/22 1014       Check-In   Supervising physician immediately available to respond to emergencies See telemetry face sheet for immediately available ER MD    Location [facility]-Cardiac & Pulmonary Rehab    Staff Present Heath Lark, RN, BSN, CCRP;Jessica [place], MA, RCEP, CCRP, Marylynn Pearson, MS, ASCM CEP, Exercise Physiologist    Virtual Visit No    Medication changes reported     No    Fall or balance concerns reported    No    Warm-up and Cool-down Performed on first and last piece of equipment    Resistance Training Performed Yes    VAD Patient? No    PAD/SET Patient? No      Pain Assessment   Currently in Pain? No/denies                Social History   Tobacco Use  Smoking Status Never  Smokeless Tobacco Never    Goals Met:  Independence with exercise equipment Exercise tolerated well No report of concerns or symptoms today  Goals Unmet:  Not Applicable  Comments: Pt able to follow exercise prescription today without complaint.  Will continue to monitor for progression.    Dr. Emily Filbert is Medical Director for [place].  Dr. Ottie Glazier is Medical Director for [facility] Pulmonary Rehabilitation.

## 2022-01-31 ENCOUNTER — Encounter: Payer: Self-pay | Admitting: *Deleted

## 2022-01-31 DIAGNOSIS — Z955 Presence of coronary angioplasty implant and graft: Secondary | ICD-10-CM

## 2022-01-31 DIAGNOSIS — I214 Non-ST elevation (NSTEMI) myocardial infarction: Secondary | ICD-10-CM

## 2022-01-31 MED ORDER — PREDNISONE 5 MG TABLET
ORAL_TABLET | 2 refills | 0 days | Status: CP
Start: 2022-01-31 — End: ?

## 2022-01-31 NOTE — Progress Notes (Signed)
Cardiac Individual Treatment Plan  Patient Details  Name: Veronica Romero MRN: [id number] Date of Birth: [birth year] Referring Provider:   Flowsheet Row Cardiac Rehab from 10/17/2021 in [facility] Cardiac and Pulmonary Rehab  Referring Provider Berniece Salines       Initial Encounter Date:  Flowsheet Row Cardiac Rehab from 10/17/2021 in [facility] Cardiac and Pulmonary Rehab  Date 10/17/21       Visit Diagnosis: NSTEMI (non-ST elevation myocardial infarction) [facility])  Status post coronary artery stent placement  Patient's Home Medications on Admission:  Current Outpatient Medications:    Adalimumab (HUMIRA PEN) 40 MG/0.4ML PNKT, Inject 0.4 mLs into the skin every Tuesday. Tuesday, Disp: , Rfl:    aspirin EC 81 MG tablet, Take 1 tablet (81 mg total) by mouth daily. Swallow whole., Disp: 90 tablet, Rfl: 1   carvedilol (COREG) 3.125 MG tablet, Take 1 tablet (3.125 mg total) by mouth 2 (two) times daily with a meal., Disp: 180 tablet, Rfl: 2   Cholecalciferol (VITAMIN D3 PO), Take 1 tablet by mouth daily., Disp: , Rfl:    clopidogrel (PLAVIX) 75 MG tablet, Take 1 tablet (75 mg total) by mouth daily., Disp: 90 tablet, Rfl: 2   dapagliflozin propanediol (FARXIGA) 10 MG TABS tablet, Take 1 tablet (10 mg total) by mouth daily before breakfast., Disp: 90 tablet, Rfl: 3   furosemide (LASIX) 40 MG tablet, Take 1 tablet (40 mg total) by mouth every other day. (Patient taking differently: Take 20 mg by mouth every other day. With additional 74m PRN), Disp: 45 tablet, Rfl: 3   leucovorin (WELLCOVORIN) 5 MG tablet, Take 15 mg by mouth every Saturday., Disp: , Rfl:    methotrexate 50 MG/2ML injection, Inject 1 mL (25 mg total) into the muscle every Saturday. Hold methotrexate for 2 more weeks and then resume 3/10, Disp: , Rfl:    Multiple Vitamin (MULTIVITAMIN WITH MINERALS) TABS tablet, Take 1 tablet by mouth daily., Disp: , Rfl:    nitroGLYCERIN (NITROSTAT) 0.4 MG SL tablet, Place 1 tablet (0.4 mg total) under the  tongue every 5 (five) minutes as needed., Disp: 25 tablet, Rfl: 2   Polyethyl Glycol-Propyl Glycol (LUBRICANT EYE DROPS) 0.4-0.3 % SOLN, Place 1 drop into both eyes 3 (three) times daily as needed (dry/irritated eyes.)., Disp: , Rfl:    potassium chloride SA (KLOR-CON M) 20 MEQ tablet, Take 1 tablet (20 mEq total) by mouth daily., Disp: 90 tablet, Rfl: 3   predniSONE (DELTASONE) 5 MG tablet, Take 10 mg by mouth in the morning., Disp: , Rfl:    rosuvastatin (CRESTOR) 40 MG tablet, Take 1 tablet (40 mg total) by mouth daily., Disp: 90 tablet, Rfl: 3   sacubitril-valsartan (ENTRESTO) 24-26 MG, Take 1 tablet by mouth 2 (two) times daily., Disp: 180 tablet, Rfl: 2   spironolactone (ALDACTONE) 25 MG tablet, Take 1/2 tablet (12.5 mg total) by mouth daily., Disp: 30 tablet, Rfl: 2   traMADol (ULTRAM) 50 MG tablet, Take 50 mg by mouth every 6 (six) hours as needed (pain.)., Disp: , Rfl:   Past Medical History: Past Medical History:  Diagnosis Date   Anemia    CHF (congestive heart failure) (HCC)    Coronary artery disease    Diabetes mellitus without complication ([facility]    Hypertension    [place]    Ischemic cardiomyopathy 06/12/2021   EF 25-30%   Pulmonary HTN (HCC)    Rheumatoid arthritis (HCC)     Tobacco Use: Social History   Tobacco  Use  Smoking Status Never  Smokeless Tobacco Never    Labs: Review Flowsheet       Latest Ref Rng & Units 06/12/2021 06/13/2021 06/16/2021 01/17/2022  Labs for ITP Cardiac and Pulmonary Rehab  Cholestrol 100 - 199 mg/dL - - 177  172   LDL (calc) 0 - 99 mg/dL - - 116  64   HDL-C >39 mg/dL - - 38  91   Trlycerides 0 - 149 mg/dL - 66  115  96   Hemoglobin A1c 4.8 - 5.6 % 5.4  - - -  PH, Arterial 7.35 - 7.45 7.451  7.155  7.359  7.471  7.518  7.507  7.513  -  PCO2 arterial 32 - 48 mmHg 39.0  72.5  41.8  29.8  37.0  38.0  39.7  -  Bicarbonate 20.0 - 28.0 mmol/L 26.6  24.7  23.7  22.0  30.1  30.1  31.9  -  TCO2 22 - 32 mmol/L  _0 33  -  Acid-base deficit 0.0 - 2.0 mmol/L 4.0  2.0  1.0  - -  O2 Saturation % 100.0  94.0  97.0  96.0  94  70  68  -     Exercise Target Goals: Exercise Program Goal: Individual exercise prescription set using results from initial 6 min walk test and THRR while considering  patient's activity barriers and safety.   Exercise Prescription Goal: Initial exercise prescription builds to 30-45 minutes a day of aerobic activity, 2-3 days per week.  Home exercise guidelines will be given to patient during program as part of exercise prescription that the participant will acknowledge.   Education: Aerobic Exercise: - Group verbal and visual presentation on the components of exercise prescription. Introduces F.I.T.T principle from ACSM for exercise prescriptions.  Reviews F.I.T.T. principles of aerobic exercise including progression. Written material given at graduation. Flowsheet Row Cardiac Rehab from 10/17/2021 in [facility] Cardiac and Pulmonary Rehab  Education need identified 10/17/21       Education: Resistance Exercise: - Group verbal and visual presentation on the components of exercise prescription. Introduces F.I.T.T principle from ACSM for exercise prescriptions  Reviews F.I.T.T. principles of resistance exercise including progression. Written material given at graduation.    Education: Exercise & Equipment Safety: - Individual verbal instruction and demonstration of equipment use and safety with use of the equipment. Flowsheet Row Cardiac Rehab from 10/17/2021 in [facility] Cardiac and Pulmonary Rehab  Date 10/17/21  Educator [facility]  Instruction Review Code 1- Verbalizes Understanding       Education: Exercise Physiology & General Exercise Guidelines: - Group verbal and written instruction with models to review the exercise physiology of the cardiovascular system and associated critical values. Provides general exercise guidelines with specific guidelines to those with  heart or lung disease.    Education: Flexibility, Balance, Mind/Body Relaxation: - Group verbal and visual presentation with interactive activity on the components of exercise prescription. Introduces F.I.T.T principle from ACSM for exercise prescriptions. Reviews F.I.T.T. principles of flexibility and balance exercise training including progression. Also discusses the mind body connection.  Reviews various relaxation techniques to help reduce and manage stress (i.e. Deep breathing, progressive muscle relaxation, and visualization). Balance handout provided to take home. Written material given at graduation.   Activity Barriers & Risk Stratification:  Activity Barriers & Cardiac Risk Stratification - 10/06/21 1316       Activity Barriers & Cardiac Risk Stratification   Activity Barriers  Arthritis    Cardiac Risk Stratification Moderate             6 Minute Walk:  6 Minute Walk     Row Name 10/17/21 1200         6 Minute Walk   Phase Initial     Distance 850 feet     Walk Time 6 minutes     # of Rest Breaks 0     MPH 1.6     METS 2.19     RPE 9     Perceived Dyspnea  0     VO2 Peak 7.68     Symptoms Yes (comment)     Comments arthritic foot pain 4/10     Resting HR 69 bpm     Resting BP 136/74     Resting Oxygen Saturation  98 %     Exercise Oxygen Saturation  during 6 min walk 97 %     Max Ex. HR 100 bpm     Max Ex. BP 142/70     2 Minute Post BP 130/80              Oxygen Initial Assessment:   Oxygen Re-Evaluation:   Oxygen Discharge (Final Oxygen Re-Evaluation):   Initial Exercise Prescription:  Initial Exercise Prescription - 10/17/21 1200       Date of Initial Exercise RX and Referring Provider   Date 10/17/21    Referring Provider Lavona [place] Tobb      Oxygen   Maintain Oxygen Saturation 88% or higher      Recumbant Bike   Level 1    RPM 50    Minutes 15    METs 2.19      NuStep   Level 1    SPM 80    Minutes 15    METs 2.19       REL-XR   Level 1    Speed 50    Minutes 15    METs 2.19      Track   Laps 22    Minutes 15    METs 2.2      Prescription Details   Frequency (times per week) 2    Duration Progress to 30 minutes of continuous aerobic without signs/symptoms of physical distress      Intensity   THRR 40-80% of Max Heartrate 100-132    Ratings of Perceived Exertion 11-13    Perceived Dyspnea 0-4      Progression   Progression Continue to progress workloads to maintain intensity without signs/symptoms of physical distress.      Resistance Training   Training Prescription Yes    Weight 2    Reps 10-15             Perform Capillary Blood Glucose checks as needed.  Exercise Prescription Changes:   Exercise Prescription Changes     Row Name 10/17/21 1200 11/07/21 1500 11/09/21 1600 11/20/21 1400 12/04/21 1500     Response to Exercise   Blood Pressure (Admit) 136/74 120/62 -- 116/60 122/58   Blood Pressure (Exercise) 142/70 122/82 -- 134/62 132/60   Blood Pressure (Exit) 130/80 118/58 -- 122/70 118/60   Heart Rate (Admit) 69 bpm 70 bpm -- 100 bpm 80 bpm   Heart Rate (Exercise) 100 bpm 118 bpm -- 112 bpm 105 bpm   Heart Rate (Exit) 74 bpm 89 bpm -- 92 bpm 92 bpm   Oxygen Saturation (Admit) 98 % -- -- -- --  Oxygen Saturation (Exercise) 97 % -- -- -- --   Oxygen Saturation (Exit) 98 % -- -- -- --   Rating of Perceived Exertion (Exercise) 9 12 -- 14 12   Perceived Dyspnea (Exercise) 0 -- -- -- --   Symptoms foot pain (4/10) foot pain -- foot pain none   Comments 6 MWT results -- -- -- --   Duration -- Progress to 30 minutes of  aerobic without signs/symptoms of physical distress -- Continue with 30 min of aerobic exercise without signs/symptoms of physical distress. Continue with 30 min of aerobic exercise without signs/symptoms of physical distress.   Intensity -- THRR unchanged -- THRR unchanged THRR unchanged     Progression   Progression -- Continue to progress workloads to  maintain intensity without signs/symptoms of physical distress. -- Continue to progress workloads to maintain intensity without signs/symptoms of physical distress. Continue to progress workloads to maintain intensity without signs/symptoms of physical distress.   Average METs -- 2.06 -- 2.62 2.77     Resistance Training   Training Prescription -- Yes -- Yes Yes   Weight -- 3 lb -- 3 lb 3 lb   Reps -- 10-15 -- 10-15 10-15     Interval Training   Interval Training -- No -- No No     Recumbant Bike   Level -- -- -- -- 1.5   Minutes -- -- -- -- 15   METs -- -- -- -- 3.09     NuStep   Level -- 4 -- 4 3   Minutes -- 15 -- 15 15   METs -- 2.3 -- 2.9 2.4     REL-XR   Level -- 1 -- 3 3   Minutes -- 15 -- 15 15   METs -- -- -- 3.2 3.3     Track   Laps -- 15 -- 14 24   Minutes -- 15 -- 15 15   METs -- 1.82 -- 1.76 2.31     Home Exercise Plan   Plans to continue exercise at -- -- Home (comment)  walking, gym, staff videos Home (comment)  walking, gym, staff videos Home (comment)  walking, gym, staff videos   Frequency -- -- Add 2 additional days to program exercise sessions. Add 2 additional days to program exercise sessions. Add 2 additional days to program exercise sessions.   Initial Home Exercises Provided -- -- 11/09/21 11/09/21 11/09/21     Oxygen   Maintain Oxygen Saturation -- 88% or higher -- 88% or higher 88% or higher    Row Name 12/18/21 1400 01/02/22 0900 01/17/22 1400 01/29/22 1400       Response to Exercise   Blood Pressure (Admit) 116/64 122/62 118/64 122/64    Blood Pressure (Exit) 126/64 128/70 102/60 122/62    Heart Rate (Admit) 86 bpm 63 bpm 74 bpm 84 bpm    Heart Rate (Exercise) 103 bpm 106 bpm 103 bpm 99 bpm    Heart Rate (Exit) 94 bpm 85 bpm 74 bpm 82 bpm    Oxygen Saturation (Admit) -- -- 95 % 94 %    Oxygen Saturation (Exercise) -- -- 93 % 91 %    Oxygen Saturation (Exit) -- -- 95 % 96 %    Rating of Perceived Exertion (Exercise) 15 13 14 15      Symptoms none none none none    Duration Continue with 30 min of aerobic exercise without signs/symptoms of physical distress. Continue with 30 min of aerobic exercise without  signs/symptoms of physical distress. Continue with 30 min of aerobic exercise without signs/symptoms of physical distress. Continue with 30 min of aerobic exercise without signs/symptoms of physical distress.    Intensity THRR unchanged THRR unchanged THRR unchanged THRR unchanged      Progression   Progression Continue to progress workloads to maintain intensity without signs/symptoms of physical distress. Continue to progress workloads to maintain intensity without signs/symptoms of physical distress. Continue to progress workloads to maintain intensity without signs/symptoms of physical distress. Continue to progress workloads to maintain intensity without signs/symptoms of physical distress.    Average METs 2.7 2.63 2.43 2.81      Resistance Training   Training Prescription Yes Yes Yes Yes    Weight 3 lb 3 lb 3 lb 3 lb    Reps 10-15 10-15 10-15 10-15      Interval Training   Interval Training No No No No      Treadmill   MPH -- 1 1.4 1.2    Grade -- 0 0 0    Minutes -- $RemoveBe'15 15 15    'AUszQsLgs$ METs -- 1.77 2.07 1.92      Recumbant Bike   Level $Remo'1 1 1 'VRupE$ 1.8    Minutes $Remove'15 15 15 15    'DQUFUdo$ METs 3.09 3.09 -- 2.7      NuStep   Level $Remo'4 4 4 5    'UKsrn$ Minutes $Rem'15 15 15 15    'MNxk$ METs 2.6 3 2.8 3.7      REL-XR   Level $Remo'5 5 5 'dMCfV$ --    Minutes $Remove'15 15 15 'juPGkdT$ --    METs 4.4 3 -- --      Track   Laps 20 20 -- 20    Minutes 15 15 -- 15    METs 2.09 2.09 -- 2.09      Home Exercise Plan   Plans to continue exercise at Home (comment)  walking, gym, staff videos Home (comment)  walking, gym, staff videos Home (comment)  walking, gym, staff videos Home (comment)  walking, gym, staff videos    Frequency Add 2 additional days to program exercise sessions. Add 2 additional days to program exercise sessions. Add 2 additional days to program exercise sessions.  Add 2 additional days to program exercise sessions.    Initial Home Exercises Provided 11/09/21 11/09/21 11/09/21 11/09/21      Oxygen   Maintain Oxygen Saturation 88% or higher 88% or higher 88% or higher 88% or higher             Exercise Comments:   Exercise Comments     Row Name 10/24/21 0956           Exercise Comments First full day of exercise!  Patient was oriented to gym and equipment including functions, settings, policies, and procedures.  Patient's individual exercise prescription and treatment plan were reviewed.  All starting workloads were established based on the results of the 6 minute walk test done at initial orientation visit.  The plan for exercise progression was also introduced and progression will be customized based on patient's performance and goals.                Exercise Goals and Review:   Exercise Goals     Row Name 10/17/21 1206             Exercise Goals   Increase Physical Activity Yes       Intervention Provide advice, education, support and counseling about physical activity/exercise needs.;Develop  an individualized exercise prescription for aerobic and resistive training based on initial evaluation findings, risk stratification, comorbidities and participant's personal goals.       Expected Outcomes Short Term: Attend rehab on a regular basis to increase amount of physical activity.;Long Term: Add in home exercise to make exercise part of routine and to increase amount of physical activity.;Long Term: Exercising regularly at least 3-5 days a week.       Increase Strength and Stamina Yes       Intervention Provide advice, education, support and counseling about physical activity/exercise needs.;Develop an individualized exercise prescription for aerobic and resistive training based on initial evaluation findings, risk stratification, comorbidities and participant's personal goals.       Expected Outcomes Short Term: Increase workloads  from initial exercise prescription for resistance, speed, and METs.;Short Term: Perform resistance training exercises routinely during rehab and add in resistance training at home;Long Term: Improve cardiorespiratory fitness, muscular endurance and strength as measured by increased METs and functional capacity (6MWT)       Able to understand and use rate of perceived exertion (RPE) scale Yes       Intervention Provide education and explanation on how to use RPE scale       Expected Outcomes Short Term: Able to use RPE daily in rehab to express subjective intensity level;Long Term:  Able to use RPE to guide intensity level when exercising independently       Able to understand and use Dyspnea scale Yes       Intervention Provide education and explanation on how to use Dyspnea scale       Expected Outcomes Short Term: Able to use Dyspnea scale daily in rehab to express subjective sense of shortness of breath during exertion;Long Term: Able to use Dyspnea scale to guide intensity level when exercising independently       Knowledge and understanding of Target Heart Rate Range (THRR) Yes       Intervention Provide education and explanation of THRR including how the numbers were predicted and where they are located for reference       Expected Outcomes Short Term: Able to state/look up THRR;Long Term: Able to use THRR to govern intensity when exercising independently;Short Term: Able to use daily as guideline for intensity in rehab       Able to check pulse independently Yes       Intervention Provide education and demonstration on how to check pulse in carotid and radial arteries.;Review the importance of being able to check your own pulse for safety during independent exercise       Expected Outcomes Short Term: Able to explain why pulse checking is important during independent exercise;Long Term: Able to check pulse independently and accurately       Understanding of Exercise Prescription Yes        Intervention Provide education, explanation, and written materials on patient's individual exercise prescription       Expected Outcomes Short Term: Able to explain program exercise prescription;Long Term: Able to explain home exercise prescription to exercise independently                Exercise Goals Re-Evaluation :  Exercise Goals Re-Evaluation     Row Name 10/24/21 0956 11/07/21 1520 11/09/21 1614 11/16/21 1533 11/20/21 1436     Exercise Goal Re-Evaluation   Exercise Goals Review Increase Physical Activity;Able to understand and use rate of perceived exertion (RPE) scale;Knowledge and understanding of Target Heart Rate Range (THRR);Understanding of  Exercise Prescription;Able to understand and use Dyspnea scale;Able to check pulse independently;Increase Strength and Stamina Increase Physical Activity;Increase Strength and Stamina;Understanding of Exercise Prescription Increase Physical Activity;Increase Strength and Stamina;Understanding of Exercise Prescription;Able to understand and use rate of perceived exertion (RPE) scale;Able to understand and use Dyspnea scale;Able to check pulse independently;Knowledge and understanding of Target Heart Rate Range (THRR) Increase Physical Activity;Increase Strength and Stamina;Understanding of Exercise Prescription Increase Physical Activity;Increase Strength and Stamina;Understanding of Exercise Prescription   Comments Reviewed RPE and dyspnea scales, THR and program prescription with pt today.  Pt voiced understanding and was given a copy of goals to take home. Veronica Romero is off to a good start in rehab.  She has found shoes that she is tolerating, but walking still hurts her feet.  She is trying to do one walking station and one seated each day.  She has surprised herself at how well she is doing.  She is already going on the seated equipment without stopping and only rest on track to give her feet a break. We will continue to monitor her progress. Reviewed  home exercise with pt today.  Pt plans to walk and go up to gym in her apartment complex at home for exercise.  We also talked about using our staff videos at home so that she doesn't have to leave her husband.  Reviewed THR, pulse, RPE, sign and symptoms, pulse oximetery and when to call 911 or MD.  Also discussed weather considerations and indoor options.  Pt voiced understanding. Veronica Romero feels like she is doing well in the program. She states that she has noticed an increase in her endurance and energy since coming to rehab. Patient says she still can not leave her husband so finding time to exercise at home can be difficult. She does plan on checking out the gym at her apartment complex. Veronica Romero is doing well in rehab.  She is up to level 4 on the NuStep.  Her feet are doing better than she thought they would in rehab.  She does walk some depending on how much her feet can tolerate.  She is still using 3 lb hand weights due to her grip.  We will continue to monitor her progress.   Expected Outcomes Short: Use RPE daily to regulate intensity. Long: Follow program prescription in THR. Short: Continue to attend rehab regularly Long: Continue to follow program prescription Short: Check out gym at complex and try staff videos Long: Continue to exercise independently Short: Check out gym at apartment complex. Long: Continue to exercise on days away from rehab. Short: Continue to walk at least once a week Long: Continue to improve stamina    Row Name 12/04/21 1554 12/12/21 0956 12/18/21 1441 01/02/22 0903 01/16/22 1019     Exercise Goal Re-Evaluation   Exercise Goals Review Increase Physical Activity;Increase Strength and Stamina;Understanding of Exercise Prescription Increase Physical Activity;Increase Strength and Stamina;Understanding of Exercise Prescription Increase Physical Activity;Increase Strength and Stamina;Understanding of Exercise Prescription Increase Physical Activity;Increase Strength and  Stamina;Understanding of Exercise Prescription Increase Physical Activity;Increase Strength and Stamina;Understanding of Exercise Prescription   Comments Veronica Romero is doing well in rehab. She reached a max of 24 laps on the track! She hits her THR most sessions. RPEs are staying in good range. We will continue to monitor. Veronica Romero is doing well in rehab.  She feels like she is running in circles  when she tries to exercise at home, as her husband seems to always need something.  She has not tried  the videos yet, so we talked about how her husband could do the chair exercises with her.  She does feel like she is getting better. Veronica Romero continues to do well in rehab. She has kept her overall average MET level above 2.7 METs. She also improved to level 5 on the XR. Veronica Romero has also consistently reached 20 laps on the track. We will continue to monitor her progress in the program. Veronica Romero is doing well in rehab. She began using the treadmill and did well at 1 mph with no incline. She has also consistently walked over 20 laps on the track. She has done well with the T4 at level 4 as well. We will continue to monitor her progress in the program. Veronica Romero is doing well in rehab.  She is feeling good with exercise in class.  She has been having a hard time exercising at home with her husband.  We talked again about trying the staff videos so that she can do it at home.   Expected Outcomes Short: Continue to work up laps on track Long: Continue to increase overall MET level Short: Try to use videos Long: Continue to improve stamina Short: Continue to push for more laps on the track. Long: Continue to increase strength and stamina. Short: Continue to push for more laps on the track. Long: Continue to increase strength and stamina. Short: Try staff videos at home Long: Continue to improve stamina    Row Name 01/29/22 1409             Exercise Goal Re-Evaluation   Exercise Goals Review Increase Physical Activity;Increase  Strength and Stamina;Understanding of Exercise Prescription       Comments Veronica Romero continues to do well in rehab. She recently increased her overall average MET level to 2.81 METs. She also improved to level 5 on the T4 and level 1.8 on the recumbent bike. We will continue to monitor her progress in the program.       Expected Outcomes Short: Continue to work on walking more. Long: Continue to increase strength and stamina.                Discharge Exercise Prescription (Final Exercise Prescription Changes):  Exercise Prescription Changes - 01/29/22 1400       Response to Exercise   Blood Pressure (Admit) 122/64    Blood Pressure (Exit) 122/62    Heart Rate (Admit) 84 bpm    Heart Rate (Exercise) 99 bpm    Heart Rate (Exit) 82 bpm    Oxygen Saturation (Admit) 94 %    Oxygen Saturation (Exercise) 91 %    Oxygen Saturation (Exit) 96 %    Rating of Perceived Exertion (Exercise) 15    Symptoms none    Duration Continue with 30 min of aerobic exercise without signs/symptoms of physical distress.    Intensity THRR unchanged      Progression   Progression Continue to progress workloads to maintain intensity without signs/symptoms of physical distress.    Average METs 2.81      Resistance Training   Training Prescription Yes    Weight 3 lb    Reps 10-15      Interval Training   Interval Training No      Treadmill   MPH 1.2    Grade 0    Minutes 15    METs 1.92      Recumbant Bike   Level 1.8    Minutes 15    METs 2.7  NuStep   Level 5    Minutes 15    METs 3.7      Track   Laps 20    Minutes 15    METs 2.09      Home Exercise Plan   Plans to continue exercise at Home (comment)   walking, gym, staff videos   Frequency Add 2 additional days to program exercise sessions.    Initial Home Exercises Provided 11/09/21      Oxygen   Maintain Oxygen Saturation 88% or higher             Nutrition:  Target Goals: Understanding of nutrition guidelines, daily  intake of sodium '1500mg'$ , cholesterol '200mg'$ , calories 30% from fat and 7% or less from saturated fats, daily to have 5 or more servings of fruits and vegetables.  Education: All About Nutrition: -Group instruction provided by verbal, written material, interactive activities, discussions, models, and posters to present general guidelines for heart healthy nutrition including fat, fiber, MyPlate, the role of sodium in heart healthy nutrition, utilization of the nutrition label, and utilization of this knowledge for meal planning. Follow up email sent as well. Written material given at graduation. Flowsheet Row Cardiac Rehab from 10/17/2021 in [facility] Cardiac and Pulmonary Rehab  Education need identified 10/17/21       Biometrics:  Pre Biometrics - 10/17/21 1207       Pre Biometrics   Height $Remov'5\' 6"'otHufc$  (1.676 m)    Weight 156 lb 6.4 oz (70.9 kg)    BMI (Calculated) 25.26    Single Leg Stand 4.53 seconds              Nutrition Therapy Plan and Nutrition Goals:  Nutrition Therapy & Goals - 11/29/21 0932       Nutrition Therapy   Diet Heart healthy, low Na    Drug/Food Interactions Statins/Certain Fruits    Protein (specify units) 85-95g    Fiber 25 grams    Whole Grain Foods 3 servings    Saturated Fats 12 max. grams    Fruits and Vegetables 8 servings/day    Sodium 2 grams      Personal Nutrition Goals   Nutrition Goal ST: include snacks in-between meals using examples provided and protien, fat, CHO structure LT: meet nutritional needs (calories and protein) while choosing heart healthy foods most often    Comments 71 y.o. F admitted to cardiac rehab s/p NSTEMI. PMHx includes "Moderate Malnutrition related to acute illness (PNA) as evidenced by moderate muscle depletion, mild fat depletion, moderate fat depletion." noted 06/12/21, rheumatoid arthritis, CAD, HFrEF, pulmonary HTN, HLD. Relevant medications includes vit D3, leucovorin, MVI, furosemide, K+, methotrexate, prednisone,  crestor, tramadol. PYP Score: 71. Vegetables & Fruits 6/12. Breads, Grains & Cereals 7/12. Red & Processed Meat 10/12. Poultry 2/2. Fish & Shellfish 3/4. Beans, Nuts & Seeds 1/4. Milk & Dairy Foods 5/6. Toppings, Oils, Seasonings & Salt 16/20. Sweets, Snacks & Restaurant Food 11/14. Beverages 10/10. Chanee reports eating this way for a while and reports no barriers aside from flavor building. B: Toast and sometimes an egg L: leftovers or fruit with yogurt - she reports not eating much D: she reports she will eat dinner most days: she reports not using lots of vegetables like she used to. She limits sweets for the most part. Drinks: iced coffee in morning, red bull (she reports feeling tired), 1-2 glasses of wine, water. She reports difficulty sleeping and she will feel fatigued a lot of the day, she reports  that exercise is helping. She reports having a lower appetite recently - she has enjoyed cooking and eating, but since her husband health has declined this past year, her appetite has been worse. She has tried protein drinks before, but she does not like them. When she was in the hospital in February her weight was 75.4kg and she is now 70.9kg; when she was in the hospital at that time she was diagnosed with moderate malnutrition as evidenced by moderate muscle depletion, mild fat depletion, moderate fat depletion. Encouraged mechanical eating, variety in foods, snacks in-between meals with protein, fat, and CHO, choosing heart healthy foods when possible while trying to meet calorie and protein needs.      Intervention Plan   Intervention Prescribe, educate and counsel regarding individualized specific dietary modifications aiming towards targeted core components such as weight, hypertension, lipid management, diabetes, heart failure and other comorbidities.    Expected Outcomes Short Term Goal: Understand basic principles of dietary content, such as calories, fat, sodium, cholesterol and nutrients.;Short  Term Goal: A plan has been developed with personal nutrition goals set during dietitian appointment.;Long Term Goal: Adherence to prescribed nutrition plan.             Nutrition Assessments:  MEDIFICTS Score Key: ?70 Need to make dietary changes  40-70 Heart Healthy Diet ? 40 Therapeutic Level Cholesterol Diet  Flowsheet Row Cardiac Rehab from 10/17/2021 in [facility] Cardiac and Pulmonary Rehab  Picture Your Plate Total Score on Admission 71      Picture Your Plate Scores: <06 Unhealthy dietary pattern with much room for improvement. 41-50 Dietary pattern unlikely to meet recommendations for good health and room for improvement. 51-60 More healthful dietary pattern, with some room for improvement.  >60 Healthy dietary pattern, although there may be some specific behaviors that could be improved.    Nutrition Goals Re-Evaluation:  Nutrition Goals Re-Evaluation     Veronica Romero Name 12/12/21 1000 01/16/22 1024           Goals   Nutrition Goal ST: include snacks in-between meals using examples provided and protien, fat, CHO structure LT: meet nutritional needs (calories and protein) while choosing heart healthy foods most often Short: Try more protein snacks.  Long: Continue to focus on heart healthy eating      Comment Veronica Romero is doing well in rehab.  She met with dietitian last week.  She is going to try to get their snacking better and aim for protein snacks. She has given up on cooking as her husband won't eat much of her cooking and is very picky.  She tries to make healthier options. Veronica Romero is doing well in rehab.  She is trying to get better snacks still.  She is getting in more protein with yogurt and fish.  She continues to find more balance as she is essentially cooking for one.      Expected Outcome Short: Try more protein snacks.  Long: Continue to focus on heart healthy eating Short: Continue to have good healthy snacks Long : continue to eat heart healthy                Nutrition Goals Discharge (Final Nutrition Goals Re-Evaluation):  Nutrition Goals Re-Evaluation - 01/16/22 1024       Goals   Nutrition Goal Short: Try more protein snacks.  Long: Continue to focus on heart healthy eating    Comment Veronica Romero is doing well in rehab.  She is trying to get better snacks still.  She  is getting in more protein with yogurt and fish.  She continues to find more balance as she is essentially cooking for one.    Expected Outcome Short: Continue to have good healthy snacks Long : continue to eat heart healthy             Psychosocial: Target Goals: Acknowledge presence or absence of significant depression and/or stress, maximize coping skills, provide positive support system. Participant is able to verbalize types and ability to use techniques and skills needed for reducing stress and depression.   Education: Stress, Anxiety, and Depression - Group verbal and visual presentation to define topics covered.  Reviews how body is impacted by stress, anxiety, and depression.  Also discusses healthy ways to reduce stress and to treat/manage anxiety and depression.  Written material given at graduation.   Education: Sleep Hygiene -Provides group verbal and written instruction about how sleep can affect your health.  Define sleep hygiene, discuss sleep cycles and impact of sleep habits. Review good sleep hygiene tips.    Initial Review & Psychosocial Screening:  Initial Psych Review & Screening - 10/06/21 1319       Initial Review   Current issues with Current Stress Concerns    Source of Stress Concerns Family    Comments Stress with husband needing total care.  Does have helpers every day so she can do what she needs to do.      Family Dynamics   Good Support System? Yes   daughter 2 grandkids     Barriers   Psychosocial barriers to participate in program The patient should benefit from training in stress management and relaxation.      Screening  Interventions   Interventions Encouraged to exercise;To provide support and resources with identified psychosocial needs;Provide feedback about the scores to participant    Expected Outcomes Short Term goal: Utilizing psychosocial counselor, staff and physician to assist with identification of specific Stressors or current issues interfering with healing process. Setting desired goal for each stressor or current issue identified.;Long Term Goal: Stressors or current issues are controlled or eliminated.;Short Term goal: Identification and review with participant of any Quality of Life or Depression concerns found by scoring the questionnaire.;Long Term goal: The participant improves quality of Life and PHQ9 Scores as seen by post scores and/or verbalization of changes             Quality of Life Scores:   Quality of Life - 10/17/21 1209       Quality of Life   Select Quality of Life      Quality of Life Scores   Health/Function Pre 11.36 %    Socioeconomic Pre 13.13 %    Psych/Spiritual Pre 10.92 %    Family Pre 20.63 %    GLOBAL Pre 12.84 %            Scores of 19 and below usually indicate a poorer quality of life in these areas.  A difference of  2-3 points is a clinically meaningful difference.  A difference of 2-3 points in the total score of the Quality of Life Index has been associated with significant improvement in overall quality of life, self-image, physical symptoms, and general health in studies assessing change in quality of life.  PHQ-9: Review Flowsheet  More data exists      01/17/2022 01/16/2022 12/19/2021 11/23/2021 10/19/2021  Depression screen PHQ 2/9  Decreased Interest 1 0 0 1 2  Down, Depressed, Hopeless 2 3 1 1  1  PHQ - 2 Score 3 3 1 2 3   Altered sleeping 2 1 1 3 2   Tired, decreased energy 2 1 1 1 1   Change in appetite 2 1 1 1 1   Feeling bad or failure about yourself  1 1 1 1 1   Trouble concentrating 1 1 1 1 1   Moving slowly or fidgety/restless 0 0 0 0  0  Suicidal thoughts 0 0 0 0 0  PHQ-9 Score 11 8 6 9 9   Difficult doing work/chores Somewhat difficult - Somewhat difficult Somewhat difficult Somewhat difficult   Interpretation of Total Score  Total Score Depression Severity:  1-4 = Minimal depression, 5-9 = Mild depression, 10-14 = Moderate depression, 15-19 = Moderately severe depression, 20-27 = Severe depression   Psychosocial Evaluation and Intervention:  Psychosocial Evaluation - 10/06/21 1326       Psychosocial Evaluation & Interventions   Interventions Encouraged to exercise with the program and follow exercise prescription    Comments Veronica Romero has no barriers to attending the program. She does have stress at home in that her husband requires fulltime care. They do have help for his care so she can have time to take care of herself. She has a daughter and two grandkids that live in [place] as her support. She is ready to get started with the program in hope that she will gain more strentgh and stamina to be able not to tire out every afternoon.    Expected Outcomes STG Veronica Romero will attend all scheduled sessions, she will have time to work on her health and stamina. LTG Veronica Romero will be able to continue to care for herself maintaining the program she started here at her home    Continue Psychosocial Services  Follow up required by staff             Psychosocial Re-Evaluation:  Psychosocial Re-Evaluation     Veronica Romero Name 11/16/21 1543 12/12/21 0957 12/19/21 1008 01/16/22 1021       Psychosocial Re-Evaluation   Current issues with Current Stress Concerns;Current Sleep Concerns Current Stress Concerns;Current Sleep Concerns Current Stress Concerns;Current Depression;Current Anxiety/Panic Current Stress Concerns;Current Depression;Current Anxiety/Panic    Comments Patient states that she does have some concerns with stress related to being her husband's caregiver. She also states that she has had some trouble falling asleep and  staying asleep at night. She states that reading is her avenue for stress relief. She reports that rehab has been good for her because it helps her to feel better. Veronica Romero continues to care for her husband as a primary caregiver.  She has a Actuary for classes.  Her doctor has changed her meds and she will pick them up soon.  She is still getting 3-4 hrs of sleep.  She seems to wake every hour and then sometimes can't get to sleep.  When she tries to exercise at home, her husband seems to always need something.  She feels that exercise has been helping her sleep as she is able to calm down easier in the evenings she exercises. Veronica Romero repeated her PHQ today. Her score improved by 3 pts.  She is still struggling with sleep and fatigue on top of caring for her husband and dealing with her personal health too.  She continues to try to stay positive Veronica Romero is doing well in rehab.  She is still stuggling sleep and fatigue still.  Her husband needs constat care and she is emotional drained.  Her PHQ is back up to an 8  again. She has good days and bad days.  The lack of sleep is her biggest contributor to how she is feeling.    Expected Outcomes Short: Continue to read and attend exercise for stress relief. Log: Maintain a positive outlook. Short; Continue to exercise for mental boost Long: conitnue to use sitter to give her some self care time Short; Continue to exercise for mental boost Long: conitnue to use sitter to give her some self care time Short: Make time to exercise for mental boost Long: Conitnue to make time for self care    Interventions Encouraged to attend Cardiac Rehabilitation for the exercise Encouraged to attend Cardiac Rehabilitation for the exercise;Stress management education Encouraged to attend Cardiac Rehabilitation for the exercise;Stress management education Encouraged to attend Cardiac Rehabilitation for the exercise;Stress management education    Continue Psychosocial Services  Follow up  required by staff -- Follow up required by staff --      Initial Review   Source of Stress Concerns Family -- -- --    Comments Stress with husband needing total care.  Does have helpers every day so she can do what she needs to do. -- -- --             Psychosocial Discharge (Final Psychosocial Re-Evaluation):  Psychosocial Re-Evaluation - 01/16/22 1021       Psychosocial Re-Evaluation   Current issues with Current Stress Concerns;Current Depression;Current Anxiety/Panic    Comments Veronica Romero is doing well in rehab.  She is still stuggling sleep and fatigue still.  Her husband needs constat care and she is emotional drained.  Her PHQ is back up to an 8 again. She has good days and bad days.  The lack of sleep is her biggest contributor to how she is feeling.    Expected Outcomes Short: Make time to exercise for mental boost Long: Conitnue to make time for self care    Interventions Encouraged to attend Cardiac Rehabilitation for the exercise;Stress management education             Vocational Rehabilitation: Provide vocational rehab assistance to qualifying candidates.   Vocational Rehab Evaluation & Intervention:   Education: Education Goals: Education classes will be provided on a variety of topics geared toward better understanding of heart health and risk factor modification. Participant will state understanding/return demonstration of topics presented as noted by education test scores.  Learning Barriers/Preferences:   General Cardiac Education Topics:  AED/CPR: - Group verbal and written instruction with the use of models to demonstrate the basic use of the AED with the basic ABC's of resuscitation.   Anatomy and Cardiac Procedures: - Group verbal and visual presentation and models provide information about basic cardiac anatomy and function. Reviews the testing methods done to diagnose heart disease and the outcomes of the test results. Describes the treatment  choices: Medical Management, Angioplasty, or Coronary Bypass Surgery for treating various heart conditions including Myocardial Infarction, Angina, Valve Disease, and Cardiac Arrhythmias.  Written material given at graduation.   Medication Safety: - Group verbal and visual instruction to review commonly prescribed medications for heart and lung disease. Reviews the medication, class of the drug, and side effects. Includes the steps to properly store meds and maintain the prescription regimen.  Written material given at graduation.   Intimacy: - Group verbal instruction through game format to discuss how heart and lung disease can affect sexual intimacy. Written material given at graduation..   Know Your Numbers and Heart Failure: - Group verbal and  visual instruction to discuss disease risk factors for cardiac and pulmonary disease and treatment options.  Reviews associated critical values for Overweight/Obesity, Hypertension, Cholesterol, and Diabetes.  Discusses basics of heart failure: signs/symptoms and treatments.  Introduces Heart Failure Zone chart for action plan for heart failure.  Written material given at graduation.   Infection Prevention: - Provides verbal and written material to individual with discussion of infection control including proper hand washing and proper equipment cleaning during exercise session. Flowsheet Row Cardiac Rehab from 10/17/2021 in [facility] Cardiac and Pulmonary Rehab  Date 10/17/21  Educator [facility]  Instruction Review Code 1- Verbalizes Understanding       Falls Prevention: - Provides verbal and written material to individual with discussion of falls prevention and safety. Flowsheet Row Cardiac Rehab from 10/17/2021 in [facility] Cardiac and Pulmonary Rehab  Date 10/17/21  Educator [facility]  Instruction Review Code 1- Verbalizes Understanding       Other: -Provides group and verbal instruction on various topics (see comments)   Knowledge Questionnaire Score:   Knowledge Questionnaire Score - 10/17/21 1210       Knowledge Questionnaire Score   Pre Score 24/26             Core Components/Risk Factors/Patient Goals at Admission:  Personal Goals and Risk Factors at Admission - 10/17/21 1208       Core Components/Risk Factors/Patient Goals on Admission    Weight Management Yes    Intervention Weight Management: Develop a combined nutrition and exercise program designed to reach desired caloric intake, while maintaining appropriate intake of nutrient and fiber, sodium and fats, and appropriate energy expenditure required for the weight goal.;Weight Management: Provide education and appropriate resources to help participant work on and attain dietary goals.    Admit Weight 156 lb 6.4 oz (70.9 kg)    Goal Weight: Short Term 150 lb (68 kg)    Goal Weight: Long Term 150 lb (68 kg)    Expected Outcomes Short Term: Continue to assess and modify interventions until short term weight is achieved;Long Term: Adherence to nutrition and physical activity/exercise program aimed toward attainment of established weight goal;Weight Maintenance: Understanding of the daily nutrition guidelines, which includes 25-35% calories from fat, 7% or less cal from saturated fats, less than $RemoveB'200mg'WhGyiYJf$  cholesterol, less than 1.5gm of sodium, & 5 or more servings of fruits and vegetables daily;Understanding recommendations for meals to include 15-35% energy as protein, 25-35% energy from fat, 35-60% energy from carbohydrates, less than $RemoveB'200mg'oLbzSnMS$  of dietary cholesterol, 20-35 gm of total fiber daily;Understanding of distribution of calorie intake throughout the day with the consumption of 4-5 meals/snacks    Heart Failure Yes    Intervention Provide a combined exercise and nutrition program that is supplemented with education, support and counseling about heart failure. Directed toward relieving symptoms such as shortness of breath, decreased exercise tolerance, and extremity edema.     Expected Outcomes Improve functional capacity of life;Short term: Attendance in program 2-3 days a week with increased exercise capacity. Reported lower sodium intake. Reported increased fruit and vegetable intake. Reports medication compliance.;Short term: Daily weights obtained and reported for increase. Utilizing diuretic protocols set by physician.;Long term: Adoption of self-care skills and reduction of barriers for early signs and symptoms recognition and intervention leading to self-care maintenance.    Lipids Yes    Intervention Provide education and support for participant on nutrition & aerobic/resistive exercise along with prescribed medications to achieve LDL '70mg'$ , HDL >$Remo'40mg'pyaul$ .    Expected Outcomes Short Term: Participant  states understanding of desired cholesterol values and is compliant with medications prescribed. Participant is following exercise prescription and nutrition guidelines.;Long Term: Cholesterol controlled with medications as prescribed, with individualized exercise RX and with personalized nutrition plan. Value goals: LDL < $Rem'70mg'jmTu$ , HDL > 40 mg.             Education:Diabetes - Individual verbal and written instruction to review signs/symptoms of diabetes, desired ranges of glucose level fasting, after meals and with exercise. Acknowledge that pre and post exercise glucose checks will be done for 3 sessions at entry of program.   Core Components/Risk Factors/Patient Goals Review:   Goals and Risk Factor Review     Row Name 11/16/21 1549 12/12/21 1002 01/16/22 1026         Core Components/Risk Factors/Patient Goals Review   Personal Goals Review Hypertension;Lipids;Weight Management/Obesity Hypertension;Lipids;Weight Management/Obesity Hypertension;Lipids;Weight Management/Obesity     Review Patient does have a BP cuff at home, but has not been checking her BP at home on a regular basis. She was informed that this would be a good way to make sure she is within her  normal range. She is happy with her current weight but wouldn't mind losing a few pounds. We spoke about how exercising more regularly at home in addition to rehab could help with weight loss. [place] is doing well in rehab. Her weight is holding steady (up 2-3 down 2-3 lb).  She is feeling pretty good overall.  Her pressures are doing well in class, but she is not checking them at home.  She just had a med change, but has not picked it up yet.  She is going to start a new BP med today and doctor warned her about feeling lightheaded with it.  She will bring it in on Thursday. Aleene is doing well in rehab.  Her weight is steady.  She would like to Romero weight.  Her pressures continue to do well in class.  She is doing well with her new BP med and not noticed any side effects from it.  She does check it at home on [address].     Expected Outcomes Short: begin checking BP at home. Long: continue to attend rehab and exercise at home for weight loss. Short; Start new med Long; continue to montior risk factors. Short: Continue to work on weight loss Long: Continue to monitor risk factors              Core Components/Risk Factors/Patient Goals at Discharge (Final Review):   Goals and Risk Factor Review - 01/16/22 1026       Core Components/Risk Factors/Patient Goals Review   Personal Goals Review Hypertension;Lipids;Weight Management/Obesity    Review Veronica Romero is doing well in rehab.  Her weight is steady.  She would like to Romero weight.  Her pressures continue to do well in class.  She is doing well with her new BP med and not noticed any side effects from it.  She does check it at home on [place].    Expected Outcomes Short: Continue to work on weight loss Long: Continue to monitor risk factors             ITP Comments:  ITP Comments     Row Name 10/06/21 1331 10/17/21 1159 10/24/21 0956 11/08/21 0955 11/29/21 1148   ITP Comments Virtual orientation call completed today. shehas an appointment  on Date: 10/17/2021  for EP eval and gym Orientation.  Documentation of diagnosis can be found in [facility] Date: 07/17/2021 .  Completed 6MWT and gym orientation. Initial ITP created and sent for review to Dr. Emily Filbert, Medical Director. First full day of exercise!  Patient was oriented to gym and equipment including functions, settings, policies, and procedures.  Patient's individual exercise prescription and treatment plan were reviewed.  All starting workloads were established based on the results of the 6 minute walk test done at initial orientation visit.  The plan for exercise progression was also introduced and progression will be customized based on patient's performance and goals. 30 Day review completed. Medical Director ITP review done, changes made as directed, and signed approval by Medical Director. Completed initial RD consultation    Row Name 12/06/21 0755 01/03/22 0754 01/31/22 0740       ITP Comments 30 Day review completed. Medical Director ITP review done, changes made as directed, and signed approval by Medical Director. 30 Day review completed. Medical Director ITP review done, changes made as directed, and signed approva30 Day review completed. Medical Director ITP review done, changes made as directed, and signed approval by Medical Director.l by Medical Director. 30 Day review completed. Medical Director ITP review done, changes made as directed, and signed approval by Medical Director.              Comments:

## 2022-02-01 ENCOUNTER — Encounter: Payer: Medicare Other | Admitting: *Deleted

## 2022-02-01 DIAGNOSIS — I214 Non-ST elevation (NSTEMI) myocardial infarction: Secondary | ICD-10-CM | POA: Diagnosis not present

## 2022-02-01 DIAGNOSIS — Z955 Presence of coronary angioplasty implant and graft: Secondary | ICD-10-CM

## 2022-02-01 NOTE — Progress Notes (Signed)
Daily Session Note  Patient Details  Name: Veronica Romero MRN: [id number] Date of Birth: 11/28/1950 Referring Provider:   Flowsheet Row Cardiac Rehab from 10/17/2021 in [facility]  Referring Provider Kardie Tobb       Encounter Date: 02/01/2022  Check In:  Session Check In - 02/01/22 1604       Check-In   Supervising physician immediately available to respond to emergencies See telemetry face sheet for immediately available ER MD    Location [facility]    Staff Present  , RN, BSN, CCRP;Noah Tickle, BS, Exercise Physiologist;Joseph Hood, RCP,RRT,BSRT    Virtual Visit No    Medication changes reported     No    Fall or balance concerns reported    No    Warm-up and Cool-down Performed on first and last piece of equipment    Resistance Training Performed Yes    VAD Patient? No    PAD/SET Patient? No      Pain Assessment   Currently in Pain? No/denies                Social History   Tobacco Use  Smoking Status Never  Smokeless Tobacco Never    Goals Met:  Independence with exercise equipment Exercise tolerated well No report of concerns or symptoms today  Goals Unmet:  Not Applicable  Comments: Pt able to follow exercise prescription today without complaint.  Will continue to monitor for progression.    Dr. Mark Simkins is Medical Director for [facility].  Dr. Fuad Aleskerov is Medical Director for [facility]. 

## 2022-02-05 ENCOUNTER — Other Ambulatory Visit (HOSPITAL_COMMUNITY): Payer: Self-pay

## 2022-02-05 ENCOUNTER — Other Ambulatory Visit: Payer: Self-pay | Admitting: Cardiology

## 2022-02-05 MED ORDER — SPIRONOLACTONE 25 MG PO TABS
12.5000 mg | ORAL_TABLET | Freq: Every day | ORAL | 2 refills | Status: DC
  Filled 2022-02-05: qty 30, 60d supply, fill #0
  Filled 2022-03-25: qty 30, 60d supply, fill #1
  Filled 2022-05-09 – 2022-05-24 (×2): qty 30, 60d supply, fill #2

## 2022-02-06 ENCOUNTER — Encounter: Payer: Medicare Other | Admitting: *Deleted

## 2022-02-06 VITALS — Ht 66.0 in | Wt 165.1 lb

## 2022-02-06 DIAGNOSIS — I214 Non-ST elevation (NSTEMI) myocardial infarction: Secondary | ICD-10-CM | POA: Diagnosis not present

## 2022-02-06 DIAGNOSIS — Z955 Presence of coronary angioplasty implant and graft: Secondary | ICD-10-CM

## 2022-02-06 NOTE — Progress Notes (Signed)
Daily Session Note  Patient Details  Name: Veronica Romero MRN: [id number] Date of Birth: 03/08/1951 Referring Provider:   Flowsheet Row Cardiac Rehab from 10/17/2021 in [facility] Cardiac and [facility] Re[facility]  Referring Provider Berniece Salines       Encounter Date: 02/06/2022  Check In:  Session Check In - 02/06/22 1054       Check-In   Supervising physician immediately available to respond to emergencies See telemetry face sheet for immediately available ER MD    Location [facility]    Staff Present Coralie Keens, MS, ASCM CEP, Exercise Physiologist;Amedio Bowlby Sherryll Burger, RN BSN;Jessica Luan Pulling, MA, RCEP, CCRP, CCET    Virtual Visit No    Medication changes reported     No    Fall or balance concerns reported    No    Warm-up and Cool-down Performed on first and last piece of equipment    Resistance Training Performed Yes    VAD Patient? No    PAD/SET Patient? No      Pain Assessment   Currently in Pain? No/denies                Social History   Tobacco Use  Smoking Status Never  Smokeless Tobacco Never    Goals Met:  Independence with exercise equipment Exercise tolerated well No report of concerns or symptoms today Strength training completed today  Goals Unmet:  Not Applicable  Comments: Pt able to follow exercise prescription today without complaint.  Will continue to monitor for progression.    Dr. Emily Filbert is Medical Director for [place].  Dr. Ottie Glazier is Medical Director for [facility].

## 2022-02-06 NOTE — Patient Instructions (Signed)
Discharge Patient Instructions  Patient Details  Name: Veronica Romero MRN: [id number] Date of Birth: 08/23/1950 Referring Provider:  Jon Billings, NP   Number of Visits: 36  Reason for Discharge:  Patient reached a stable level of exercise. Patient independent in their exercise. Patient has met program and personal goals.  Smoking History:  Social History   Tobacco Use  Smoking Status Never  Smokeless Tobacco Never    Diagnosis:  NSTEMI (non-ST elevation myocardial infarction) ([place])  Status post coronary artery stent placement  Initial Exercise Prescription:  Initial Exercise Prescription - 10/17/21 1200       Date of Initial Exercise RX and Referring Provider   Date 10/17/21    Referring Provider Godfrey Pick Tobb      Oxygen   Maintain Oxygen Saturation 88% or higher      Recumbant Bike   Level 1    RPM 50    Minutes 15    METs 2.19      NuStep   Level 1    SPM 80    Minutes 15    METs 2.19      REL-XR   Level 1    Speed 50    Minutes 15    METs 2.19      Track   Laps 22    Minutes 15    METs 2.2      Prescription Details   Frequency (times per week) 2    Duration Progress to 30 minutes of continuous aerobic without signs/symptoms of physical distress      Intensity   THRR 40-80% of Max Heartrate 100-132    Ratings of Perceived Exertion 11-13    Perceived Dyspnea 0-4      Progression   Progression Continue to progress workloads to maintain intensity without signs/symptoms of physical distress.      Resistance Training   Training Prescription Yes    Weight 2    Reps 10-15             Discharge Exercise Prescription (Final Exercise Prescription Changes):  Exercise Prescription Changes - 01/29/22 1400       Response to Exercise   Blood Pressure (Admit) 122/64    Blood Pressure (Exit) 122/62    Heart Rate (Admit) 84 bpm    Heart Rate (Exercise) 99 bpm    Heart Rate (Exit) 82 bpm    Oxygen Saturation (Admit) 94 %    Oxygen  Saturation (Exercise) 91 %    Oxygen Saturation (Exit) 96 %    Rating of Perceived Exertion (Exercise) 15    Symptoms none    Duration Continue with 30 min of aerobic exercise without signs/symptoms of physical distress.    Intensity THRR unchanged      Progression   Progression Continue to progress workloads to maintain intensity without signs/symptoms of physical distress.    Average METs 2.81      Resistance Training   Training Prescription Yes    Weight 3 lb    Reps 10-15      Interval Training   Interval Training No      Treadmill   MPH 1.2    Grade 0    Minutes 15    METs 1.92      Recumbant Bike   Level 1.8    Minutes 15    METs 2.7      NuStep   Level 5    Minutes 15    METs 3.7  Track   Laps 20    Minutes 15    METs 2.09      Home Exercise Plan   Plans to continue exercise at Home (comment)   walking, gym, staff videos   Frequency Add 2 additional days to program exercise sessions.    Initial Home Exercises Provided 11/09/21      Oxygen   Maintain Oxygen Saturation 88% or higher             Functional Capacity:  6 Minute Walk     Row Name 10/17/21 1200 02/06/22 1008       6 Minute Walk   Phase Initial Discharge    Distance 850 feet 1055 feet    Distance % Change -- 24.1 %    Distance Feet Change -- 205 ft    Walk Time 6 minutes 6 minutes    # of Rest Breaks 0 0    MPH 1.6 1.99    METS 2.19 2.56    RPE 9 13    Perceived Dyspnea  0 1    VO2 Peak 7.68 8.99    Symptoms Yes (comment) Yes (comment)    Comments arthritic foot pain 4/10 SOB, arthritis foot pain 3/10    Resting HR 69 bpm 88 bpm    Resting BP 136/74 122/62    Resting Oxygen Saturation  98 % 96 %    Exercise Oxygen Saturation  during 6 min walk 97 % 95 %    Max Ex. HR 100 bpm 106 bpm    Max Ex. BP 142/70 146/70    2 Minute Post BP 130/80 --              Nutrition & Weight - Outcomes:  Pre Biometrics - 10/17/21 1207       Pre Biometrics   Height $Remov'5\' 6"'COYfjT$   (1.676 m)    Weight 156 lb 6.4 oz (70.9 kg)    BMI (Calculated) 25.26    Single Leg Stand 4.53 seconds             Post Biometrics - 02/06/22 1007        Post  Biometrics   Height $Remov'5\' 6"'ZlgVIi$  (1.676 m)    Weight 165 lb 1.6 oz (74.9 kg)    BMI (Calculated) 26.66             Nutrition:  Nutrition Therapy & Goals - 11/29/21 0932       Nutrition Therapy   Diet Heart healthy, low Na    Drug/Food Interactions Statins/Certain Fruits    Protein (specify units) 85-95g    Fiber 25 grams    Whole Grain Foods 3 servings    Saturated Fats 12 max. grams    Fruits and Vegetables 8 servings/day    Sodium 2 grams      Personal Nutrition Goals   Nutrition Goal ST: include snacks in-between meals using examples provided and protien, fat, CHO structure LT: meet nutritional needs (calories and protein) while choosing heart healthy foods most often    Comments 71 y.o. F admitted to cardiac rehab s/p NSTEMI. PMHx includes "Moderate Malnutrition related to acute illness (PNA) as evidenced by moderate muscle depletion, mild fat depletion, moderate fat depletion." noted 06/12/21, rheumatoid arthritis, CAD, HFrEF, pulmonary HTN, HLD. Relevant medications includes vit D3, leucovorin, MVI, furosemide, K+, methotrexate, prednisone, crestor, tramadol. PYP Score: 71. Vegetables & Fruits 6/12. Breads, Grains & Cereals 7/12. Red & Processed Meat 10/12. Poultry 2/2. Fish & Shellfish 3/4. Beans, Nuts &  Seeds 1/4. Milk & Dairy Foods 5/6. Toppings, Oils, Seasonings & Salt 16/20. Sweets, Snacks & Restaurant Food 11/14. Beverages 10/10. Veronica Romero reports eating this way for a while and reports no barriers aside from flavor building. B: Toast and sometimes an egg L: leftovers or fruit with yogurt - she reports not eating much D: she reports she will eat dinner most days: she reports not using lots of vegetables like she used to. She limits sweets for the most part. Drinks: iced coffee in morning, red bull (she reports feeling  tired), 1-2 glasses of wine, water. She reports difficulty sleeping and she will feel fatigued a lot of the day, she reports that exercise is helping. She reports having a lower appetite recently - she has enjoyed cooking and eating, but since her husband health has declined this past year, her appetite has been worse. She has tried protein drinks before, but she does not like them. When she was in the hospital in February her weight was 75.4kg and she is now 70.9kg; when she was in the hospital at that time she was diagnosed with moderate malnutrition as evidenced by moderate muscle depletion, mild fat depletion, moderate fat depletion. Encouraged mechanical eating, variety in foods, snacks in-between meals with protein, fat, and CHO, choosing heart healthy foods when possible while trying to meet calorie and protein needs.      Intervention Plan   Intervention Prescribe, educate and counsel regarding individualized specific dietary modifications aiming towards targeted core components such as weight, hypertension, lipid management, diabetes, heart failure and other comorbidities.    Expected Outcomes Short Term Goal: Understand basic principles of dietary content, such as calories, fat, sodium, cholesterol and nutrients.;Short Term Goal: A plan has been developed with personal nutrition goals set during dietitian appointment.;Long Term Goal: Adherence to prescribed nutrition plan.              Goals reviewed with patient; copy given to patient.

## 2022-02-13 ENCOUNTER — Encounter: Payer: Medicare Other | Admitting: *Deleted

## 2022-02-13 DIAGNOSIS — I214 Non-ST elevation (NSTEMI) myocardial infarction: Secondary | ICD-10-CM

## 2022-02-13 DIAGNOSIS — Z955 Presence of coronary angioplasty implant and graft: Secondary | ICD-10-CM

## 2022-02-13 NOTE — Progress Notes (Signed)
Daily Session Note  Patient Details  Name: Fatisha L Bergum MRN: [id number] Date of Birth: 08/15/1950 Referring Provider:   Flowsheet Row Cardiac Rehab from 10/17/2021 in [facility]  Referring Provider Kardie Tobb       Encounter Date: 02/13/2022  Check In:  Session Check In - 02/13/22 1010       Check-In   Supervising physician immediately available to respond to emergencies See telemetry face sheet for immediately available ER MD    Location [facility]    Staff Present  , RN, BSN, CCRP;Kara Langdon, MS, ASCM CEP, Exercise Physiologist;Jessica Hawkins, MA, RCEP, CCRP, CCET    Virtual Visit No    Medication changes reported     No    Fall or balance concerns reported    No    Warm-up and Cool-down Performed on first and last piece of equipment    Resistance Training Performed Yes    VAD Patient? No    PAD/SET Patient? No      Pain Assessment   Currently in Pain? No/denies                Social History   Tobacco Use  Smoking Status Never  Smokeless Tobacco Never    Goals Met:  Independence with exercise equipment Exercise tolerated well No report of concerns or symptoms today  Goals Unmet:  Not Applicable  Comments: Pt able to follow exercise prescription today without complaint.  Will continue to monitor for progression.    Dr. Mark Gargan is Medical Director for [facility].  Dr. Fuad Aleskerov is Medical Director for [facility]. 

## 2022-02-15 ENCOUNTER — Encounter: Payer: Medicare Other | Admitting: *Deleted

## 2022-02-15 ENCOUNTER — Ambulatory Visit
Admission: RE | Admit: 2022-02-15 | Discharge: 2022-02-15 | Disposition: A | Discharge status: Home / Self Care | Payer: Medicare Other | Source: Ambulatory Visit | Attending: Nurse Practitioner | Admitting: Nurse Practitioner

## 2022-02-15 DIAGNOSIS — Z955 Presence of coronary angioplasty implant and graft: Secondary | ICD-10-CM

## 2022-02-15 DIAGNOSIS — I214 Non-ST elevation (NSTEMI) myocardial infarction: Secondary | ICD-10-CM | POA: Diagnosis not present

## 2022-02-15 DIAGNOSIS — Z1231 Encounter for screening mammogram for malignant neoplasm of breast: Secondary | ICD-10-CM | POA: Diagnosis present

## 2022-02-15 NOTE — Progress Notes (Signed)
Daily Session Note  Patient Details  Name: Veronica Romero MRN: [id number] Date of Birth: 10-15-1950 Referring Provider:   Flowsheet Row Cardiac Rehab from 10/17/2021 in [facility]  Referring Provider Berniece Salines       Encounter Date: 02/15/2022  Check In:  Session Check In - 02/15/22 1533       Check-In   Supervising physician immediately available to respond to emergencies See telemetry face sheet for immediately available ER MD    Location [facility]    Staff Present Justin Mend, RCP,RRT,BSRT;Jerol Rufener Sherryll Burger, RN BSN;Jessica Luan Pulling, MA, RCEP, CCRP, CCET    Virtual Visit No    Medication changes reported     No    Fall or balance concerns reported    No    Warm-up and Cool-down Performed on first and last piece of equipment    Resistance Training Performed Yes    VAD Patient? No    PAD/SET Patient? No      Pain Assessment   Currently in Pain? No/denies                Social History   Tobacco Use  Smoking Status Never  Smokeless Tobacco Never    Goals Met:  Independence with exercise equipment Exercise tolerated well No report of concerns or symptoms today Strength training completed today  Goals Unmet:  Not Applicable  Comments: Pt able to follow exercise prescription today without complaint.  Will continue to monitor for progression.    Dr. Emily Filbert is Medical Director for [place].  Dr. Ottie Glazier is Medical Director for [facility].

## 2022-02-19 ENCOUNTER — Inpatient Hospital Stay
Admission: RE | Admit: 2022-02-19 | Discharge: 2022-02-19 | Disposition: A | Discharge status: Home / Self Care | Payer: Self-pay | Source: Ambulatory Visit | Attending: *Deleted | Admitting: *Deleted

## 2022-02-19 ENCOUNTER — Other Ambulatory Visit: Payer: Self-pay | Admitting: *Deleted

## 2022-02-19 DIAGNOSIS — Z1231 Encounter for screening mammogram for malignant neoplasm of breast: Secondary | ICD-10-CM

## 2022-02-19 NOTE — Progress Notes (Signed)
Please let patient know her Mammogram did not show any evidence of a malignancy.  The recommendation is to repeat the Mammogram in 1 year.  

## 2022-02-20 ENCOUNTER — Encounter: Payer: Medicare Other | Admitting: *Deleted

## 2022-02-20 DIAGNOSIS — I214 Non-ST elevation (NSTEMI) myocardial infarction: Secondary | ICD-10-CM

## 2022-02-20 NOTE — Progress Notes (Signed)
Daily Session Note  Patient Details  Name: Veronica Romero MRN: [id number] Date of Birth: 03-08-1951 Referring Provider:   Flowsheet Row Cardiac Rehab from 10/17/2021 in [facility] Card[facility] Pul[facility] Re[facility]  Referring Provider Berniece Salines       Encounter Date: 02/20/2022  Check In:  Session Check In - 02/20/22 1024       Check-In   Supervising physician immediately available to respond to emergencies See telemetry face sheet for immediately available ER MD    Location [facility]    Staff Present Coralie Keens, MS, ASCM CEP, Exercise Physiologist;Jessica Luan Pulling, MA, RCEP, CCRP, Mindi Curling, RN, [place]    Virtual Visit No    Medication changes reported     No    Fall or balance concerns reported    No    Warm-up and Cool-down Performed on first and last piece of equipment    VAD Patient? No    PAD/SET Patient? No      Pain Assessment   Currently in Pain? No/denies                Social History   Tobacco Use  Smoking Status Never  Smokeless Tobacco Never    Goals Met:  Independence with exercise equipment Exercise tolerated well No report of concerns or symptoms today Strength training completed today  Goals Unmet:  Not Applicable  Comments: Pt able to follow exercise prescription today without complaint.  Will continue to monitor for progression.    Dr. Emily Filbert is Medical Director for [place].  Dr. Ottie Glazier is Medical Director for [facility] Pulmonary [facility].

## 2022-02-21 ENCOUNTER — Encounter: Payer: Self-pay | Admitting: Pharmacy Technician

## 2022-02-21 NOTE — Patient Outreach (Signed)
Spoke with patient about assistance with medications while in Medicare Part D coverage gap.  Patient indicated that she is already approved to receive Humira and Farxiga from the pharmaceutical companies' patient assistance programs.  Patient stated that she is no longer taking the Entresto.  Patient looking for assistance with generic medications.  Told patient that we would be happy to fill those medications for her, but there would be a cost.  Only provide free medications that we can obtain for free from the pharmaceutical companies.  Patient declined.  Decided to continue to obtain her medications at her current pharmacy.  Jacquelynn Cree Patient Advocate Specialist [place] at [facility]

## 2022-02-22 ENCOUNTER — Encounter: Payer: Medicare Other | Admitting: *Deleted

## 2022-02-22 DIAGNOSIS — I214 Non-ST elevation (NSTEMI) myocardial infarction: Secondary | ICD-10-CM

## 2022-02-22 DIAGNOSIS — Z955 Presence of coronary angioplasty implant and graft: Secondary | ICD-10-CM

## 2022-02-22 NOTE — Progress Notes (Signed)
Daily Session Note  Patient Details  Name: Veronica Romero MRN: [id number] Date of Birth: May 01, 1950 Referring Provider:   Flowsheet Row Cardiac Rehab from 10/17/2021 in [facility] Card[facility]monary Rehab  Referring Provider Berniece Salines       Encounter Date: 02/22/2022  Check In:  Session Check In - 02/22/22 [place]       Check-In   Supervising physician immediately available to respond to emergencies See telemetry face sheet for immediately available ER MD    Location [facility]    Staff Present Antionette Fairy, BS, Exercise Physiologist;Jessica [place], MA, RCEP, CCRP, Mindi Curling, RN, [place]    Virtual Visit No    Medication changes reported     No    Fall or balance concerns reported    No    Warm-up and Cool-down Performed on first and last piece of equipment    Resistance Training Performed Yes    VAD Patient? No    PAD/SET Patient? No      Pain Assessment   Currently in Pain? No/denies                Social History   Tobacco Use  Smoking Status Never  Smokeless Tobacco Never    Goals Met:  Independence with exercise equipment Exercise tolerated well No report of concerns or symptoms today Strength training completed today  Goals Unmet:  Not Applicable  Comments: Pt able to follow exercise prescription today without complaint.  Will continue to monitor for progression.    Dr. Emily Filbert is Medical Director for [facility].  Dr. Ottie Glazier is Medical Director for [facility].

## 2022-02-24 ENCOUNTER — Other Ambulatory Visit (HOSPITAL_COMMUNITY): Payer: Self-pay

## 2022-02-27 ENCOUNTER — Encounter: Payer: Medicare Other | Admitting: *Deleted

## 2022-02-27 DIAGNOSIS — I214 Non-ST elevation (NSTEMI) myocardial infarction: Secondary | ICD-10-CM

## 2022-02-27 DIAGNOSIS — Z955 Presence of coronary angioplasty implant and graft: Secondary | ICD-10-CM

## 2022-02-27 NOTE — Progress Notes (Signed)
Daily Session Note  Patient Details  Name: Veronica Romero MRN: [id number] Date of Birth: 1950/08/02 Referring Provider:   Flowsheet Row Cardiac Rehab from 10/17/2021 in [facility] and Pulmonary Rehab  Referring Provider Berniece Salines       Encounter Date: 02/27/2022  Check In:  Session Check In - 02/27/22 1031       Check-In   Supervising physician immediately available to respond to emergencies See telemetry face sheet for immediately available ER MD    Location [facility]    Staff Present Coralie Keens, MS, ASCM CEP, Exercise Physiologist;Jessica Luan Pulling, MA, RCEP, CCRP, CCET;Treydon Henricks Sherryll Burger, RN BSN    Virtual Visit No    Medication changes reported     No    Fall or balance concerns reported    No    Warm-up and Cool-down Performed on first and last piece of equipment    Resistance Training Performed Yes    VAD Patient? No    PAD/SET Patient? No      Pain Assessment   Currently in Pain? No/denies                Social History   Tobacco Use  Smoking Status Never  Smokeless Tobacco Never    Goals Met:  Independence with exercise equipment Exercise tolerated well No report of concerns or symptoms today Strength training completed today  Goals Unmet:  Not Applicable  Comments: Pt able to follow exercise prescription today without complaint.  Will continue to monitor for progression.    Dr. Emily Filbert is Medical Director for [place].  Dr. Ottie Glazier is Medical Director for [facility]monary Rehabilitation.

## 2022-02-28 ENCOUNTER — Encounter: Payer: Self-pay | Admitting: *Deleted

## 2022-02-28 DIAGNOSIS — Z955 Presence of coronary angioplasty implant and graft: Secondary | ICD-10-CM

## 2022-02-28 DIAGNOSIS — I214 Non-ST elevation (NSTEMI) myocardial infarction: Secondary | ICD-10-CM

## 2022-02-28 NOTE — Progress Notes (Signed)
Cardiac Individual Treatment Plan  Patient Details  Name: Veronica Romero MRN: [id number] Date of Birth: [birth year] Referring Provider:   Flowsheet Row Cardiac Rehab from 10/17/2021 in [facility] Cardiac and Pulmonary Rehab  Referring Provider Berniece Salines       Initial Encounter Date:  Flowsheet Row Cardiac Rehab from 10/17/2021 in [facility] Cardiac and Pulmonary Rehab  Date 10/17/21       Visit Diagnosis: NSTEMI (non-ST elevation myocardial infarction) [facility])  Status post coronary artery stent placement  Patient's Home Medications on Admission:  Current Outpatient Medications:    Adalimumab (HUMIRA PEN) 40 MG/0.4ML PNKT, Inject 0.4 mLs into the skin every Tuesday. Tuesday, Disp: , Rfl:    aspirin EC 81 MG tablet, Take 1 tablet (81 mg total) by mouth daily. Swallow whole., Disp: 90 tablet, Rfl: 1   carvedilol (COREG) 3.125 MG tablet, Take 1 tablet (3.125 mg total) by mouth 2 (two) times daily with a meal., Disp: 180 tablet, Rfl: 2   Cholecalciferol (VITAMIN D3 PO), Take 1 tablet by mouth daily., Disp: , Rfl:    clopidogrel (PLAVIX) 75 MG tablet, Take 1 tablet (75 mg total) by mouth daily., Disp: 90 tablet, Rfl: 2   dapagliflozin propanediol (FARXIGA) 10 MG TABS tablet, Take 1 tablet (10 mg total) by mouth daily before breakfast., Disp: 90 tablet, Rfl: 3   furosemide (LASIX) 40 MG tablet, Take 1 tablet (40 mg total) by mouth every other day. (Patient taking differently: Take 20 mg by mouth every other day. With additional 74m PRN), Disp: 45 tablet, Rfl: 3   leucovorin (WELLCOVORIN) 5 MG tablet, Take 15 mg by mouth every Saturday., Disp: , Rfl:    methotrexate 50 MG/2ML injection, Inject 1 mL (25 mg total) into the muscle every Saturday. Hold methotrexate for 2 more weeks and then resume 3/10, Disp: , Rfl:    Multiple Vitamin (MULTIVITAMIN WITH MINERALS) TABS tablet, Take 1 tablet by mouth daily., Disp: , Rfl:    nitroGLYCERIN (NITROSTAT) 0.4 MG SL tablet, Place 1 tablet (0.4 mg total) under the  tongue every 5 (five) minutes as needed., Disp: 25 tablet, Rfl: 2   Polyethyl Glycol-Propyl Glycol (LUBRICANT EYE DROPS) 0.4-0.3 % SOLN, Place 1 drop into both eyes 3 (three) times daily as needed (dry/irritated eyes.)., Disp: , Rfl:    potassium chloride SA (KLOR-CON M) 20 MEQ tablet, Take 1 tablet (20 mEq total) by mouth daily., Disp: 90 tablet, Rfl: 3   predniSONE (DELTASONE) 5 MG tablet, Take 10 mg by mouth in the morning., Disp: , Rfl:    rosuvastatin (CRESTOR) 40 MG tablet, Take 1 tablet (40 mg total) by mouth daily., Disp: 90 tablet, Rfl: 3   sacubitril-valsartan (ENTRESTO) 24-26 MG, Take 1 tablet by mouth 2 (two) times daily., Disp: 180 tablet, Rfl: 2   spironolactone (ALDACTONE) 25 MG tablet, Take 1/2 tablet (12.5 mg total) by mouth daily., Disp: 30 tablet, Rfl: 2   traMADol (ULTRAM) 50 MG tablet, Take 50 mg by mouth every 6 (six) hours as needed (pain.)., Disp: , Rfl:   Past Medical History: Past Medical History:  Diagnosis Date   Anemia    CHF (congestive heart failure) (HCC)    Coronary artery disease    Diabetes mellitus without complication ([facility]    Hypertension    [place]    Ischemic cardiomyopathy 06/12/2021   EF 25-30%   Pulmonary HTN (HCC)    Rheumatoid arthritis (HCC)     Tobacco Use: Social History   Tobacco  Use  Smoking Status Never  Smokeless Tobacco Never    Labs: Review Flowsheet       Latest Ref Rng & Units 06/12/2021 06/13/2021 06/16/2021 01/17/2022  Labs for ITP Cardiac and Pulmonary Rehab  Cholestrol 100 - 199 mg/dL - - 177  172   LDL (calc) 0 - 99 mg/dL - - 116  64   HDL-C >39 mg/dL - - 38  91   Trlycerides 0 - 149 mg/dL - 66  115  96   Hemoglobin A1c 4.8 - 5.6 % 5.4  - - -  PH, Arterial 7.35 - 7.45 7.451  7.155  7.359  7.471  7.518  7.507  7.513  -  PCO2 arterial 32 - 48 mmHg 39.0  72.5  41.8  29.8  37.0  38.0  39.7  -  Bicarbonate 20.0 - 28.0 mmol/L 26.6  24.7  23.7  22.0  30.1  30.1  31.9  -  TCO2 22 - 32 mmol/L  _0 33  -  Acid-base deficit 0.0 - 2.0 mmol/L 4.0  2.0  1.0  - -  O2 Saturation % 100.0  94.0  97.0  96.0  94  70  68  -     Exercise Target Goals: Exercise Program Goal: Individual exercise prescription set using results from initial 6 min walk test and THRR while considering  patient's activity barriers and safety.   Exercise Prescription Goal: Initial exercise prescription builds to 30-45 minutes a day of aerobic activity, 2-3 days per week.  Home exercise guidelines will be given to patient during program as part of exercise prescription that the participant will acknowledge.   Education: Aerobic Exercise: - Group verbal and visual presentation on the components of exercise prescription. Introduces F.I.T.T principle from ACSM for exercise prescriptions.  Reviews F.I.T.T. principles of aerobic exercise including progression. Written material given at graduation. Flowsheet Row Cardiac Rehab from 10/17/2021 in [facility] Cardiac and Pulmonary Rehab  Education need identified 10/17/21       Education: Resistance Exercise: - Group verbal and visual presentation on the components of exercise prescription. Introduces F.I.T.T principle from ACSM for exercise prescriptions  Reviews F.I.T.T. principles of resistance exercise including progression. Written material given at graduation.    Education: Exercise & Equipment Safety: - Individual verbal instruction and demonstration of equipment use and safety with use of the equipment. Flowsheet Row Cardiac Rehab from 10/17/2021 in [facility] Cardiac and Pulmonary Rehab  Date 10/17/21  Educator [facility]  Instruction Review Code 1- Verbalizes Understanding       Education: Exercise Physiology & General Exercise Guidelines: - Group verbal and written instruction with models to review the exercise physiology of the cardiovascular system and associated critical values. Provides general exercise guidelines with specific guidelines to those with  heart or lung disease.    Education: Flexibility, Balance, Mind/Body Relaxation: - Group verbal and visual presentation with interactive activity on the components of exercise prescription. Introduces F.I.T.T principle from ACSM for exercise prescriptions. Reviews F.I.T.T. principles of flexibility and balance exercise training including progression. Also discusses the mind body connection.  Reviews various relaxation techniques to help reduce and manage stress (i.e. Deep breathing, progressive muscle relaxation, and visualization). Balance handout provided to take home. Written material given at graduation.   Activity Barriers & Risk Stratification:  Activity Barriers & Cardiac Risk Stratification - 10/06/21 1316       Activity Barriers & Cardiac Risk Stratification   Activity Barriers  Arthritis    Cardiac Risk Stratification Moderate             6 Minute Walk:  6 Minute Walk     Row Name 10/17/21 1200 02/06/22 1008       6 Minute Walk   Phase Initial Discharge    Distance 850 feet 1055 feet    Distance % Change -- 24.1 %    Distance Feet Change -- 205 ft    Walk Time 6 minutes 6 minutes    # of Rest Breaks 0 0    MPH 1.6 1.99    METS 2.19 2.56    RPE 9 13    Perceived Dyspnea  0 1    VO2 Peak 7.68 8.99    Symptoms Yes (comment) Yes (comment)    Comments arthritic foot pain 4/10 SOB, arthritis foot pain 3/10    Resting HR 69 bpm 88 bpm    Resting BP 136/74 122/62    Resting Oxygen Saturation  98 % 96 %    Exercise Oxygen Saturation  during 6 min walk 97 % 95 %    Max Ex. HR 100 bpm 106 bpm    Max Ex. BP 142/70 146/70    2 Minute Post BP 130/80 --             Oxygen Initial Assessment:   Oxygen Re-Evaluation:   Oxygen Discharge (Final Oxygen Re-Evaluation):   Initial Exercise Prescription:  Initial Exercise Prescription - 10/17/21 1200       Date of Initial Exercise RX and Referring Provider   Date 10/17/21    Referring Provider Godfrey Pick Tobb       Oxygen   Maintain Oxygen Saturation 88% or higher      Recumbant Bike   Level 1    RPM 50    Minutes 15    METs 2.19      NuStep   Level 1    SPM 80    Minutes 15    METs 2.19      REL-XR   Level 1    Speed 50    Minutes 15    METs 2.19      Track   Laps 22    Minutes 15    METs 2.2      Prescription Details   Frequency (times per week) 2    Duration Progress to 30 minutes of continuous aerobic without signs/symptoms of physical distress      Intensity   THRR 40-80% of Max Heartrate 100-132    Ratings of Perceived Exertion 11-13    Perceived Dyspnea 0-4      Progression   Progression Continue to progress workloads to maintain intensity without signs/symptoms of physical distress.      Resistance Training   Training Prescription Yes    Weight 2    Reps 10-15             Perform Capillary Blood Glucose checks as needed.  Exercise Prescription Changes:   Exercise Prescription Changes     Row Name 10/17/21 1200 11/07/21 1500 11/09/21 1600 11/20/21 1400 12/04/21 1500     Response to Exercise   Blood Pressure (Admit) 136/74 120/62 -- 116/60 122/58   Blood Pressure (Exercise) 142/70 122/82 -- 134/62 132/60   Blood Pressure (Exit) 130/80 118/58 -- 122/70 118/60   Heart Rate (Admit) 69 bpm 70 bpm -- 100 bpm 80 bpm   Heart Rate (Exercise) 100 bpm 118 bpm -- 112 bpm  105 bpm   Heart Rate (Exit) 74 bpm 89 bpm -- 92 bpm 92 bpm   Oxygen Saturation (Admit) 98 % -- -- -- --   Oxygen Saturation (Exercise) 97 % -- -- -- --   Oxygen Saturation (Exit) 98 % -- -- -- --   Rating of Perceived Exertion (Exercise) 9 12 -- 14 12   Perceived Dyspnea (Exercise) 0 -- -- -- --   Symptoms foot pain (4/10) foot pain -- foot pain none   Comments 6 MWT results -- -- -- --   Duration -- Progress to 30 minutes of  aerobic without signs/symptoms of physical distress -- Continue with 30 min of aerobic exercise without signs/symptoms of physical distress. Continue with 30 min of  aerobic exercise without signs/symptoms of physical distress.   Intensity -- THRR unchanged -- THRR unchanged THRR unchanged     Progression   Progression -- Continue to progress workloads to maintain intensity without signs/symptoms of physical distress. -- Continue to progress workloads to maintain intensity without signs/symptoms of physical distress. Continue to progress workloads to maintain intensity without signs/symptoms of physical distress.   Average METs -- 2.06 -- 2.62 2.77     Resistance Training   Training Prescription -- Yes -- Yes Yes   Weight -- 3 lb -- 3 lb 3 lb   Reps -- 10-15 -- 10-15 10-15     Interval Training   Interval Training -- No -- No No     Recumbant Bike   Level -- -- -- -- 1.5   Minutes -- -- -- -- 15   METs -- -- -- -- 3.09     NuStep   Level -- 4 -- 4 3   Minutes -- 15 -- 15 15   METs -- 2.3 -- 2.9 2.4     REL-XR   Level -- 1 -- 3 3   Minutes -- 15 -- 15 15   METs -- -- -- 3.2 3.3     Track   Laps -- 15 -- 14 24   Minutes -- 15 -- 15 15   METs -- 1.82 -- 1.76 2.31     Home Exercise Plan   Plans to continue exercise at -- -- Home (comment)  walking, gym, staff videos Home (comment)  walking, gym, staff videos Home (comment)  walking, gym, staff videos   Frequency -- -- Add 2 additional days to program exercise sessions. Add 2 additional days to program exercise sessions. Add 2 additional days to program exercise sessions.   Initial Home Exercises Provided -- -- 11/09/21 11/09/21 11/09/21     Oxygen   Maintain Oxygen Saturation -- 88% or higher -- 88% or higher 88% or higher    Row Name 12/18/21 1400 01/02/22 0900 01/17/22 1400 01/29/22 1400 02/13/22 1500     Response to Exercise   Blood Pressure (Admit) 116/64 122/62 118/64 122/64 122/62   Blood Pressure (Exercise) -- -- -- -- 146/70   Blood Pressure (Exit) 126/64 128/70 102/60 122/62 122/64   Heart Rate (Admit) 86 bpm 63 bpm 74 bpm 84 bpm 82 bpm   Heart Rate (Exercise) 103 bpm 106  bpm 103 bpm 99 bpm 106 bpm   Heart Rate (Exit) 94 bpm 85 bpm 74 bpm 82 bpm 86 bpm   Oxygen Saturation (Admit) -- -- 95 % 94 % 96 %   Oxygen Saturation (Exercise) -- -- 93 % 91 % 91 %   Oxygen Saturation (Exit) -- -- 95 % 96 %  96 %   Rating of Perceived Exertion (Exercise) _0 --   Symptoms _1    Duration Continue with 30 min of aerobic exercise without signs/symptoms of physical distress. Continue with 30 min of aerobic exercise without signs/symptoms of physical distress. Continue with 30 min of aerobic exercise without signs/symptoms of physical distress. Continue with 30 min of aerobic exercise without signs/symptoms of physical distress. Continue with 30 min of aerobic exercise without signs/symptoms of physical distress.   Intensity _2      Progression   Progression Continue to progress workloads to maintain intensity without signs/symptoms of physical distress. Continue to progress workloads to maintain intensity without signs/symptoms of physical distress. Continue to progress workloads to maintain intensity without signs/symptoms of physical distress. Continue to progress workloads to maintain intensity without signs/symptoms of physical distress. Continue to progress workloads to maintain intensity without signs/symptoms of physical distress.   Average METs 2.7 2.63 2.43 2.81 3.05     Resistance Training   Training Prescription _3    Weight 3 lb 3 lb 3 lb 3 lb 3 lb   Reps 10-15 10-15 10-15 10-15 10-15     Interval Training   Interval Training _4      Treadmill   MPH -- 1 1.4 1.2 --   Grade -- 0 0 0 --   Minutes -- _5 --   METs -- 1.77 2.07 1.92 --     Recumbant Bike   Level _6 1.8 2.1   Minutes _7 METs 3.09 3.09 -- 2.7 3.03     NuStep   Level _8 Minutes _9 METs 2.6 3 2.8 3.7 3.2     REL-XR   Level _10 -- --   Minutes _11 -- --   METs 4.4 3 -- -- --     Track   Laps 20 20 -- 20 --   Minutes 15 15 -- 15 --   METs 2.09 2.09 -- 2.09 --     Home Exercise Plan   Plans to continue exercise at Home (comment)  walking, gym, staff videos Home (comment)  walking, gym, staff videos Home (comment)  walking, gym, staff videos Home (comment)  walking, gym, staff videos Home (comment)  walking, gym, staff videos   Frequency Add 2 additional days to program exercise sessions. Add 2 additional days to program exercise sessions. Add 2 additional days to program exercise sessions. Add 2 additional days to program exercise sessions. Add 2 additional days to program exercise sessions.   Initial Home Exercises Provided 11/09/21 11/09/21 11/09/21 11/09/21 11/09/21     Oxygen   Maintain Oxygen Saturation 88% or higher 88% or higher 88% or higher 88% or higher 88% or higher    Row Name 02/26/22 1400             Response to Exercise   Blood Pressure (Admit) 132/70       Blood Pressure (Exit) 112/62       Heart Rate (Admit) 85 bpm       Heart Rate (Exercise) 104 bpm       Heart Rate (Exit) 87 bpm       Oxygen Saturation (Admit) 97 %       Oxygen Saturation (Exercise)  92 %       Oxygen Saturation (Exit) 95 %       Symptoms none       Duration Continue with 30 min of aerobic exercise without signs/symptoms of physical distress.       Intensity THRR unchanged         Progression   Progression Continue to progress workloads to maintain intensity without signs/symptoms of physical distress.       Average METs 3.2         Resistance Training   Training Prescription Yes       Weight 3 lb       Reps 10-15         Interval Training   Interval Training No         Recumbant Bike   Level 2.2       Minutes 15       METs 2.9         NuStep   Level 6       Minutes 15       METs 4.3         REL-XR   Level 3       Minutes 15       METs 4.5         Track   Laps 30       Minutes 15        METs 2.63         Home Exercise Plan   Plans to continue exercise at Home (comment)  walking, gym, staff videos       Frequency Add 2 additional days to program exercise sessions.       Initial Home Exercises Provided 11/09/21         Oxygen   Maintain Oxygen Saturation 88% or higher                Exercise Comments:   Exercise Comments     Row Name 10/24/21 0956           Exercise Comments First full day of exercise!  Patient was oriented to gym and equipment including functions, settings, policies, and procedures.  Patient's individual exercise prescription and treatment plan were reviewed.  All starting workloads were established based on the results of the 6 minute walk test done at initial orientation visit.  The plan for exercise progression was also introduced and progression will be customized based on patient's performance and goals.                Exercise Goals and Review:   Exercise Goals     Row Name 10/17/21 1206             Exercise Goals   Increase Physical Activity Yes       Intervention Provide advice, education, support and counseling about physical activity/exercise needs.;Develop an individualized exercise prescription for aerobic and resistive training based on initial evaluation findings, risk stratification, comorbidities and participant's personal goals.       Expected Outcomes Short Term: Attend rehab on a regular basis to increase amount of physical activity.;Long Term: Add in home exercise to make exercise part of routine and to increase amount of physical activity.;Long Term: Exercising regularly at least 3-5 days a week.       Increase Strength and Stamina Yes       Intervention Provide advice, education, support and counseling about physical activity/exercise needs.;Develop an individualized exercise prescription for aerobic and resistive  training based on initial evaluation findings, risk stratification, comorbidities and  participant's personal goals.       Expected Outcomes Short Term: Increase workloads from initial exercise prescription for resistance, speed, and METs.;Short Term: Perform resistance training exercises routinely during rehab and add in resistance training at home;Long Term: Improve cardiorespiratory fitness, muscular endurance and strength as measured by increased METs and functional capacity (6MWT)       Able to understand and use rate of perceived exertion (RPE) scale Yes       Intervention Provide education and explanation on how to use RPE scale       Expected Outcomes Short Term: Able to use RPE daily in rehab to express subjective intensity level;Long Term:  Able to use RPE to guide intensity level when exercising independently       Able to understand and use Dyspnea scale Yes       Intervention Provide education and explanation on how to use Dyspnea scale       Expected Outcomes Short Term: Able to use Dyspnea scale daily in rehab to express subjective sense of shortness of breath during exertion;Long Term: Able to use Dyspnea scale to guide intensity level when exercising independently       Knowledge and understanding of Target Heart Rate Range (THRR) Yes       Intervention Provide education and explanation of THRR including how the numbers were predicted and where they are located for reference       Expected Outcomes Short Term: Able to state/look up THRR;Long Term: Able to use THRR to govern intensity when exercising independently;Short Term: Able to use daily as guideline for intensity in rehab       Able to check pulse independently Yes       Intervention Provide education and demonstration on how to check pulse in carotid and radial arteries.;Review the importance of being able to check your own pulse for safety during independent exercise       Expected Outcomes Short Term: Able to explain why pulse checking is important during independent exercise;Long Term: Able to check pulse  independently and accurately       Understanding of Exercise Prescription Yes       Intervention Provide education, explanation, and written materials on patient's individual exercise prescription       Expected Outcomes Short Term: Able to explain program exercise prescription;Long Term: Able to explain home exercise prescription to exercise independently                Exercise Goals Re-Evaluation :  Exercise Goals Re-Evaluation     Row Name 10/24/21 0956 11/07/21 1520 11/09/21 1614 11/16/21 1533 11/20/21 1436     Exercise Goal Re-Evaluation   Exercise Goals Review Increase Physical Activity;Able to understand and use rate of perceived exertion (RPE) scale;Knowledge and understanding of Target Heart Rate Range (THRR);Understanding of Exercise Prescription;Able to understand and use Dyspnea scale;Able to check pulse independently;Increase Strength and Stamina Increase Physical Activity;Increase Strength and Stamina;Understanding of Exercise Prescription Increase Physical Activity;Increase Strength and Stamina;Understanding of Exercise Prescription;Able to understand and use rate of perceived exertion (RPE) scale;Able to understand and use Dyspnea scale;Able to check pulse independently;Knowledge and understanding of Target Heart Rate Range (THRR) Increase Physical Activity;Increase Strength and Stamina;Understanding of Exercise Prescription Increase Physical Activity;Increase Strength and Stamina;Understanding of Exercise Prescription   Comments Reviewed RPE and dyspnea scales, THR and program prescription with pt today.  Pt voiced understanding and was given a copy of goals to  take home. Veronica Romero is off to a good start in rehab.  She has found shoes that she is tolerating, but walking still hurts her feet.  She is trying to do one walking station and one seated each day.  She has surprised herself at how well she is doing.  She is already going on the seated equipment without stopping and only rest  on track to give her feet a break. We will continue to monitor her progress. Reviewed home exercise with pt today.  Pt plans to walk and go up to gym in her apartment complex at home for exercise.  We also talked about using our staff videos at home so that she doesn't have to leave her husband.  Reviewed THR, pulse, RPE, sign and symptoms, pulse oximetery and when to call 911 or MD.  Also discussed weather considerations and indoor options.  Pt voiced understanding. Veronica Romero feels like she is doing well in the program. She states that she has noticed an increase in her endurance and energy since coming to rehab. Patient says she still can not leave her husband so finding time to exercise at home can be difficult. She does plan on checking out the gym at her apartment complex. Veronica Romero is doing well in rehab.  She is up to level 4 on the NuStep.  Her feet are doing better than she thought they would in rehab.  She does walk some depending on how much her feet can tolerate.  She is still using 3 lb hand weights due to her grip.  We will continue to monitor her progress.   Expected Outcomes Short: Use RPE daily to regulate intensity. Long: Follow program prescription in THR. Short: Continue to attend rehab regularly Long: Continue to follow program prescription Short: Check out gym at complex and try staff videos Long: Continue to exercise independently Short: Check out gym at apartment complex. Long: Continue to exercise on days away from rehab. Short: Continue to walk at least once a week Long: Continue to improve stamina    Row Name 12/04/21 1554 12/12/21 0956 12/18/21 1441 01/02/22 0903 01/16/22 1019     Exercise Goal Re-Evaluation   Exercise Goals Review Increase Physical Activity;Increase Strength and Stamina;Understanding of Exercise Prescription Increase Physical Activity;Increase Strength and Stamina;Understanding of Exercise Prescription Increase Physical Activity;Increase Strength and  Stamina;Understanding of Exercise Prescription Increase Physical Activity;Increase Strength and Stamina;Understanding of Exercise Prescription Increase Physical Activity;Increase Strength and Stamina;Understanding of Exercise Prescription   Comments Veronica Romero is doing well in rehab. She reached a max of 24 laps on the track! She hits her THR most sessions. RPEs are staying in good range. We will continue to monitor. Veronica Romero is doing well in rehab.  She feels like she is running in circles  when she tries to exercise at home, as her husband seems to always need something.  She has not tried the videos yet, so we talked about how her husband could do the chair exercises with her.  She does feel like she is getting better. Veronica Romero continues to do well in rehab. She has kept her overall average MET level above 2.7 METs. She also improved to level 5 on the XR. Veronica Romero has also consistently reached 20 laps on the track. We will continue to monitor her progress in the program. Veronica Romero is doing well in rehab. She began using the treadmill and did well at 1 mph with no incline. She has also consistently walked over 20 laps on the track. She has  done well with the T4 at level 4 as well. We will continue to monitor her progress in the program. Veronica Romero is doing well in rehab.  She is feeling good with exercise in class.  She has been having a hard time exercising at home with her husband.  We talked again about trying the staff videos so that she can do it at home.   Expected Outcomes Short: Continue to work up laps on track Long: Continue to increase overall MET level Short: Try to use videos Long: Continue to improve stamina Short: Continue to push for more laps on the track. Long: Continue to increase strength and stamina. Short: Continue to push for more laps on the track. Long: Continue to increase strength and stamina. Short: Try staff videos at home Long: Continue to improve stamina    Row Name 01/29/22 1409 02/06/22 1020  02/13/22 1540 02/26/22 1424       Exercise Goal Re-Evaluation   Exercise Goals Review Increase Physical Activity;Increase Strength and Stamina;Understanding of Exercise Prescription Increase Physical Activity;Increase Strength and Stamina;Understanding of Exercise Prescription Increase Physical Activity;Increase Strength and Stamina;Understanding of Exercise Prescription Increase Physical Activity;Increase Strength and Stamina;Understanding of Exercise Prescription    Comments Veronica Romero continues to do well in rehab. She recently increased her overall average MET level to 2.81 METs. She also improved to level 5 on the T4 and level 1.8 on the recumbent bike. We will continue to monitor her progress in the program. Veronica Romero improved her post 6MWT by 250 Ft!!  She has already gone over to the gym at her complex.  They have treadmill and bikes and weights.  She is also going to check to see if she has [employer] research officer, government with her insurances.  She is feeling stronger overall. Veronica Romero continues to do well in rehab. She is getting very close to graduation. On top of her improving on her post 6MWT, she also increased to level 6 on the T4 Nustep. It is difficult for her to increase her handweights because of her RA. Her overall METS are now over 3. We will continue to monitor until she graduates. Veronica Romero is doing well in rehab. She increased her average overall MET level to 3.2 METs. She also has done well on the bike at level 2.2 and has gotten up to 30 laps on the track. We will continue to monitor her progress until she graduates.    Expected Outcomes Short: Continue to work on walking more. Long: Continue to increase strength and stamina. Short: Continue to work towards Long: Continue to improve stamina Short: Graduate Long: Continue to increase overall MET level Short: Graduate Long: Continue to increase overall MET level             Discharge Exercise Prescription (Final Exercise Prescription Changes):  Exercise  Prescription Changes - 02/26/22 1400       Response to Exercise   Blood Pressure (Admit) 132/70    Blood Pressure (Exit) 112/62    Heart Rate (Admit) 85 bpm    Heart Rate (Exercise) 104 bpm    Heart Rate (Exit) 87 bpm    Oxygen Saturation (Admit) 97 %    Oxygen Saturation (Exercise) 92 %    Oxygen Saturation (Exit) 95 %    Symptoms none    Duration Continue with 30 min of aerobic exercise without signs/symptoms of physical distress.    Intensity THRR unchanged      Progression   Progression Continue to progress workloads to maintain intensity without signs/symptoms  of physical distress.    Average METs 3.2      Resistance Training   Training Prescription Yes    Weight 3 lb    Reps 10-15      Interval Training   Interval Training No      Recumbant Bike   Level 2.2    Minutes 15    METs 2.9      NuStep   Level 6    Minutes 15    METs 4.3      REL-XR   Level 3    Minutes 15    METs 4.5      Track   Laps 30    Minutes 15    METs 2.63      Home Exercise Plan   Plans to continue exercise at Home (comment)   walking, gym, staff videos   Frequency Add 2 additional days to program exercise sessions.    Initial Home Exercises Provided 11/09/21      Oxygen   Maintain Oxygen Saturation 88% or higher             Nutrition:  Target Goals: Understanding of nutrition guidelines, daily intake of sodium <1534m, cholesterol <2020m calories 30% from fat and 7% or less from saturated fats, daily to have 5 or more servings of fruits and vegetables.  Education: All About Nutrition: -Group instruction provided by verbal, written material, interactive activities, discussions, models, and posters to present general guidelines for heart healthy nutrition including fat, fiber, MyPlate, the role of sodium in heart healthy nutrition, utilization of the nutrition label, and utilization of this knowledge for meal planning. Follow up email sent as well. Written material given at  graduation. Flowsheet [facility]  Education need identified 10/17/21       Biometrics:  Pre Biometrics - 10/17/21 1207       Pre Biometrics   Height _0  (1.676 m)    Weight 156 lb 6.4 oz (70.9 kg)    BMI (Calculated) 25.26    Single Leg Stand 4.53 seconds             Post Biometrics - 02/06/22 1007        Post  Biometrics   Height _1  (1.676 m)    Weight 165 lb 1.6 oz (74.9 kg)    BMI (Calculated) 26.66             Nutrition Therapy Plan and Nutrition Goals:  Nutrition Therapy & Goals - 11/29/21 0932       Nutrition Therapy   Diet Heart healthy, low Na    Drug/Food Interactions Statins/Certain Fruits    Protein (specify units) 85-95g    Fiber 25 grams    Whole Grain Foods 3 servings    Saturated Fats 12 max. grams    Fruits and Vegetables 8 servings/day    Sodium 2 grams      Personal Nutrition Goals   Nutrition Goal ST: include snacks in-between meals using examples provided and protien, fat, CHO structure LT: meet nutritional needs (calories and protein) while choosing heart healthy foods most often    Comments [age].o. F admitted to cardiac rehab s/p NSTEMI. PMHx includes "Moderate Malnutrition related to acute illness (PNA) as evidenced by moderate muscle depletion, mild fat depletion, moderate fat depletion." noted 06/12/21, rheumatoid arthritis, CAD, HFrEF, pulmonary HTN, HLD. Relevant medications includes vit D3, leucovorin, MVI, furosemide, K+, methotrexate, prednisone, crestor, tramadol. PYP Score:  71. Vegetables & Fruits 6/12. Breads, Grains & Cereals 7/12. Red & Processed Meat 10/12. Poultry 2/2. Fish & Shellfish 3/4. Beans, Nuts & Seeds 1/4. Milk & Dairy Foods 5/6. Toppings, Oils, Seasonings & Salt 16/20. Sweets, Snacks & Restaurant Food 11/14. Beverages 10/10. Darielys reports eating this way for a while and reports no barriers aside from flavor building. B: Toast and sometimes an egg L:  leftovers or fruit with yogurt - she reports not eating much D: she reports she will eat dinner most days: she reports not using lots of vegetables like she used to. She limits sweets for the most part. Drinks: iced coffee in morning, red bull (she reports feeling tired), 1-2 glasses of wine, water. She reports difficulty sleeping and she will feel fatigued a lot of the day, she reports that exercise is helping. She reports having a lower appetite recently - she has enjoyed cooking and eating, but since her husband health has declined this past year, her appetite has been worse. She has tried protein drinks before, but she does not like them. When she was in the hospital in February her weight was 75.4kg and she is now 70.9kg; when she was in the hospital at that time she was diagnosed with moderate malnutrition as evidenced by moderate muscle depletion, mild fat depletion, moderate fat depletion. Encouraged mechanical eating, variety in foods, snacks in-between meals with protein, fat, and CHO, choosing heart healthy foods when possible while trying to meet calorie and protein needs.      Intervention Plan   Intervention Prescribe, educate and counsel regarding individualized specific dietary modifications aiming towards targeted core components such as weight, hypertension, lipid management, diabetes, heart failure and other comorbidities.    Expected Outcomes Short Term Goal: Understand basic principles of dietary content, such as calories, fat, sodium, cholesterol and nutrients.;Short Term Goal: A plan has been developed with personal nutrition goals set during dietitian appointment.;Long Term Goal: Adherence to prescribed nutrition plan.             Nutrition Assessments:  MEDIFICTS Score Key: ?70 Need to make dietary changes  40-70 Heart Healthy Diet ? 40 Therapeutic Level Cholesterol Diet  Flowsheet Row Cardiac Rehab from 02/22/2022 in [facility]monary Re[facility]  Picture Your Plate  Total Score on Discharge 64      Picture Your Plate Scores: <69 Unhealthy dietary pattern with much room for improvement. 41-50 Dietary pattern unlikely to meet recommendations for good health and room for improvement. 51-60 More healthful dietary pattern, with some room for improvement.  >60 Healthy dietary pattern, although there may be some specific behaviors that could be improved.    Nutrition Goals Re-Evaluation:  Nutrition Goals Re-Evaluation     Veronica Romero Name 12/12/21 1000 01/16/22 1024 02/06/22 1025         Goals   Nutrition Goal ST: include snacks in-between meals using examples provided and protien, fat, CHO structure LT: meet nutritional needs (calories and protein) while choosing heart healthy foods most often Short: Try more protein snacks.  Long: Continue to focus on heart healthy eating Short: Continue to have good healthy snacks Long : continue to eat heart healthy     Comment Veronica Romero is doing well in rehab.  She met with dietitian last week.  She is going to try to get their snacking better and aim for protein snacks. She has given up on cooking as her husband won't eat much of her cooking and is very picky.  She tries to make  healthier options. Veronica Romero is doing well in rehab.  She is trying to get better snacks still.  She is getting in more protein with yogurt and fish.  She continues to find more balance as she is essentially cooking for one. Veronica Romero is doing well in rehab. She is nearing graduation.  She is still struggling with appetite.  She is either all out or not.  She is good about eating healthy when she does eat.     Expected Outcome Short: Try more protein snacks.  Long: Continue to focus on heart healthy eating Short: Continue to have good healthy snacks Long : continue to eat heart healthy Continue to focus on heart healthy eating              Nutrition Goals Discharge (Final Nutrition Goals Re-Evaluation):  Nutrition Goals Re-Evaluation - 02/06/22 1025        Goals   Nutrition Goal Short: Continue to have good healthy snacks Long : continue to eat heart healthy    Comment Veronica Romero is doing well in rehab. She is nearing graduation.  She is still struggling with appetite.  She is either all out or not.  She is good about eating healthy when she does eat.    Expected Outcome Continue to focus on heart healthy eating             Psychosocial: Target Goals: Acknowledge presence or absence of significant depression and/or stress, maximize coping skills, provide positive support system. Participant is able to verbalize types and ability to use techniques and skills needed for reducing stress and depression.   Education: Stress, Anxiety, and Depression - Group verbal and visual presentation to define topics covered.  Reviews how body is impacted by stress, anxiety, and depression.  Also discusses healthy ways to reduce stress and to treat/manage anxiety and depression.  Written material given at graduation.   Education: Sleep Hygiene -Provides group verbal and written instruction about how sleep can affect your health.  Define sleep hygiene, discuss sleep cycles and impact of sleep habits. Review good sleep hygiene tips.    Initial Review & Psychosocial Screening:  Initial Psych Review & Screening - 10/06/21 1319       Initial Review   Current issues with Current Stress Concerns    Source of Stress Concerns Family    Comments Stress with husband needing total care.  Does have helpers every day so she can do what she needs to do.      Family Dynamics   Good Support System? Yes   daughter 2 grandkids     Barriers   Psychosocial barriers to participate in program The patient should benefit from training in stress management and relaxation.      Screening Interventions   Interventions Encouraged to exercise;To provide support and resources with identified psychosocial needs;Provide feedback about the scores to participant    Expected Outcomes  Short Term goal: Utilizing psychosocial counselor, staff and physician to assist with identification of specific Stressors or current issues interfering with healing process. Setting desired goal for each stressor or current issue identified.;Long Term Goal: Stressors or current issues are controlled or eliminated.;Short Term goal: Identification and review with participant of any Quality of Life or Depression concerns found by scoring the questionnaire.;Long Term goal: The participant improves quality of Life and PHQ9 Scores as seen by post scores and/or verbalization of changes             Quality of Life Scores:   Quality  of Life - 02/22/22 1550       Quality of Life   Select Quality of Life      Quality of Life Scores   Health/Function Pre 11.36 %    Health/Function Post 16.17 %    Health/Function % Change 42.34 %    Socioeconomic Pre 13.13 %    Socioeconomic Post 12.93 %    Socioeconomic % Change  -1.52 %    Psych/Spiritual Pre 10.92 %    Psych/Spiritual Post 12.07 %    Psych/Spiritual % Change 10.53 %    Family Pre 20.63 %    Family Post 21.9 %    Family % Change 6.16 %    GLOBAL Pre 12.84 %    GLOBAL Post 15.5 %    GLOBAL % Change 20.72 %            Scores of 19 and below usually indicate a poorer quality of life in these areas.  A difference of  2-3 points is a clinically meaningful difference.  A difference of 2-3 points in the total score of the Quality of Life Index has been associated with significant improvement in overall quality of life, self-image, physical symptoms, and general health in studies assessing change in quality of life.  PHQ-9: Review Flowsheet  More data exists      02/22/2022 01/17/2022 01/16/2022 12/19/2021 11/23/2021  Depression screen PHQ 2/9  Decreased Interest 2 1 0 0 1  Down, Depressed, Hopeless _0 PHQ - 2 Score _1 Altered sleeping _2 Tired, decreased energy _3 Change in appetite _4 Feeling bad  or failure about yourself  _5 Trouble concentrating _6 Moving slowly or fidgety/restless 0 0 0 0 0  Suicidal thoughts 0 0 0 0 0  PHQ-9 Score _7 Difficult doing work/chores Somewhat difficult Somewhat difficult - Somewhat difficult Somewhat difficult   Interpretation of Total Score  Total Score Depression Severity:  1-4 = Minimal depression, 5-9 = Mild depression, 10-14 = Moderate depression, 15-19 = Moderately severe depression, 20-27 = Severe depression   Psychosocial Evaluation and Intervention:  Psychosocial Evaluation - 10/06/21 1326       Psychosocial Evaluation & Interventions   Interventions Encouraged to exercise with the program and follow exercise prescription    Comments Veronica Romero has no barriers to attending the program. She does have stress at home in that her husband requires fulltime care. They do have help for his care so she can have time to take care of herself. She has a daughter and two grandkids that live in [place] as her support. She is ready to get started with the program in hope that she will gain more strentgh and stamina to be able not to tire out every afternoon.    Expected Outcomes STG Veronica Romero will attend all scheduled sessions, she will have time to work on her health and stamina. LTG Veronica Romero will be able to continue to care for herself maintaining the program she started here at her home    Continue Psychosocial Services  Follow up required by staff             Psychosocial Re-Evaluation:  Psychosocial Re-Evaluation     Veronica Romero Name 11/16/21 1543 12/12/21 0957 12/19/21 1008 01/16/22 1021 02/06/22 1023     Psychosocial  Re-Evaluation   Current issues with Current Stress Concerns;Current Sleep Concerns Current Stress Concerns;Current Sleep Concerns Current Stress Concerns;Current Depression;Current Anxiety/Panic Current Stress Concerns;Current Depression;Current Anxiety/Panic Current Stress Concerns;Current Depression;Current  Anxiety/Panic   Comments Patient states that she does have some concerns with stress related to being her husband's caregiver. She also states that she has had some trouble falling asleep and staying asleep at night. She states that reading is her avenue for stress relief. She reports that rehab has been good for her because it helps her to feel better. Veronica Romero continues to care for her husband as a primary caregiver.  She has a Actuary for classes.  Her doctor has changed her meds and she will pick them up soon.  She is still getting 3-4 hrs of sleep.  She seems to wake every hour and then sometimes can't get to sleep.  When she tries to exercise at home, her husband seems to always need something.  She feels that exercise has been helping her sleep as she is able to calm down easier in the evenings she exercises. Veronica Romero repeated her PHQ today. Her score improved by 3 pts.  She is still struggling with sleep and fatigue on top of caring for her husband and dealing with her personal health too.  She continues to try to stay positive Veronica Romero is doing well in rehab.  She is still stuggling sleep and fatigue still.  Her husband needs constat care and she is emotional drained.  Her PHQ is back up to an 8 again. She has good days and bad days.  The lack of sleep is her biggest contributor to how she is feeling. Veronica Romero is nearing graduation.  She continues to stuggle with sleep and her husband.  She needs more help, but they can't afford it.  She will do her PHQ as part of grad paperwork.  She tries to stay positve and looks for the good.   Expected Outcomes Short: Continue to read and attend exercise for stress relief. Log: Maintain a positive outlook. Short; Continue to exercise for mental boost Long: conitnue to use sitter to give her some self care time Short; Continue to exercise for mental boost Long: conitnue to use sitter to give her some self care time Short: Make time to exercise for mental boost Long: Conitnue  to make time for self care Continue to make time for self care.   Interventions Encouraged to attend Cardiac Rehabilitation for the exercise Encouraged to attend Cardiac Rehabilitation for the exercise;Stress management education Encouraged to attend Cardiac Rehabilitation for the exercise;Stress management education Encouraged to attend Cardiac Rehabilitation for the exercise;Stress management education Encouraged to attend Cardiac Rehabilitation for the exercise;Stress management education   Continue Psychosocial Services  Follow up required by staff -- Follow up required by staff -- Follow up required by staff     Initial Review   Source of Stress Concerns Family -- -- -- --   Comments Stress with husband needing total care.  Does have helpers every day so she can do what she needs to do. -- -- -- --            Psychosocial Discharge (Final Psychosocial Re-Evaluation):  Psychosocial Re-Evaluation - 02/06/22 1023       Psychosocial Re-Evaluation   Current issues with Current Stress Concerns;Current Depression;Current Anxiety/Panic    Comments Veronica Romero is nearing graduation.  She continues to stuggle with sleep and her husband.  She needs more help, but they can't afford it.  She will do her PHQ as part of grad paperwork.  She tries to stay positve and looks for the good.    Expected Outcomes Continue to make time for self care.    Interventions Encouraged to attend Cardiac Rehabilitation for the exercise;Stress management education    Continue Psychosocial Services  Follow up required by staff             Vocational Rehabilitation: Provide vocational rehab assistance to qualifying candidates.   Vocational Rehab Evaluation & Intervention:   Education: Education Goals: Education classes will be provided on a variety of topics geared toward better understanding of heart health and risk factor modification. Participant will state understanding/return demonstration of topics  presented as noted by education test scores.  Learning Barriers/Preferences:   General Cardiac Education Topics:  AED/CPR: - Group verbal and written instruction with the use of models to demonstrate the basic use of the AED with the basic ABC's of resuscitation.   Anatomy and Cardiac Procedures: - Group verbal and visual presentation and models provide information about basic cardiac anatomy and function. Reviews the testing methods done to diagnose heart disease and the outcomes of the test results. Describes the treatment choices: Medical Management, Angioplasty, or Coronary Bypass Surgery for treating various heart conditions including Myocardial Infarction, Angina, Valve Disease, and Cardiac Arrhythmias.  Written material given at graduation.   Medication Safety: - Group verbal and visual instruction to review commonly prescribed medications for heart and lung disease. Reviews the medication, class of the drug, and side effects. Includes the steps to properly store meds and maintain the prescription regimen.  Written material given at graduation.   Intimacy: - Group verbal instruction through game format to discuss how heart and lung disease can affect sexual intimacy. Written material given at graduation..   Know Your Numbers and Heart Failure: - Group verbal and visual instruction to discuss disease risk factors for cardiac and pulmonary disease and treatment options.  Reviews associated critical values for Overweight/Obesity, Hypertension, Cholesterol, and Diabetes.  Discusses basics of heart failure: signs/symptoms and treatments.  Introduces Heart Failure Zone chart for action plan for heart failure.  Written material given at graduation.   Infection Prevention: - Provides verbal and written material to individual with discussion of infection control including proper hand washing and proper equipment cleaning during exercise session. Flowsheet Row Cardiac Rehab from 10/17/2021  in [facility]  Date 10/17/21  Educator [facility]  Instruction Review Code 1- Verbalizes Understanding       Falls Prevention: - Provides verbal and written material to individual with discussion of falls prevention and safety. Flowsheet Row Cardiac Rehab from 10/17/2021 in [facility] Pulmonary Rehab  Date 10/17/21  Educator [facility]  Instruction Review Code 1- Verbalizes Understanding       Other: -Provides group and verbal instruction on various topics (see comments)   Knowledge Questionnaire Score:  Knowledge Questionnaire Score - 02/22/22 1550       Knowledge Questionnaire Score   Post Score 23/26             Core Components/Risk Factors/Patient Goals at Admission:  Personal Goals and Risk Factors at Admission - 10/17/21 1208       Core Components/Risk Factors/Patient Goals on Admission    Weight Management Yes    Intervention Weight Management: Develop a combined nutrition and exercise program designed to reach desired caloric intake, while maintaining appropriate intake of nutrient and fiber, sodium and fats, and appropriate energy expenditure required for  the weight goal.;Weight Management: Provide education and appropriate resources to help participant work on and attain dietary goals.    Admit Weight 156 lb 6.4 oz (70.9 kg)    Goal Weight: Short Term 150 lb (68 kg)    Goal Weight: Long Term 150 lb (68 kg)    Expected Outcomes Short Term: Continue to assess and modify interventions until short term weight is achieved;Long Term: Adherence to nutrition and physical activity/exercise program aimed toward attainment of established weight goal;Weight Maintenance: Understanding of the daily nutrition guidelines, which includes 25-35% calories from fat, 7% or less cal from saturated fats, less than 218m cholesterol, less than 1.5gm of sodium, & 5 or more servings of fruits and vegetables daily;Understanding recommendations for meals to include 15-35% energy  as protein, 25-35% energy from fat, 35-60% energy from carbohydrates, less than 2065mof dietary cholesterol, 20-35 gm of total fiber daily;Understanding of distribution of calorie intake throughout the day with the consumption of 4-5 meals/snacks    Heart Failure Yes    Intervention Provide a combined exercise and nutrition program that is supplemented with education, support and counseling about heart failure. Directed toward relieving symptoms such as shortness of breath, decreased exercise tolerance, and extremity edema.    Expected Outcomes Improve functional capacity of life;Short term: Attendance in program 2-3 days a week with increased exercise capacity. Reported lower sodium intake. Reported increased fruit and vegetable intake. Reports medication compliance.;Short term: Daily weights obtained and reported for increase. Utilizing diuretic protocols set by physician.;Long term: Adoption of self-care skills and reduction of barriers for early signs and symptoms recognition and intervention leading to self-care maintenance.    Lipids Yes    Intervention Provide education and support for participant on nutrition & aerobic/resistive exercise along with prescribed medications to achieve LDL <7027mHDL >72m77m  Expected Outcomes Short Term: Participant states understanding of desired cholesterol values and is compliant with medications prescribed. Participant is following exercise prescription and nutrition guidelines.;Long Term: Cholesterol controlled with medications as prescribed, with individualized exercise RX and with personalized nutrition plan. Value goals: LDL < 70mg20mL > 40 mg.             Education:Diabetes - Individual verbal and written instruction to review signs/symptoms of diabetes, desired ranges of glucose level fasting, after meals and with exercise. Acknowledge that pre and post exercise glucose checks will be done for 3 sessions at entry of program.   Core Components/Risk  Factors/Patient Goals Review:   Goals and Risk Factor Review     Row Name 11/16/21 1549 12/12/21 1002 01/16/22 1026 02/06/22 1027       Core Components/Risk Factors/Patient Goals Review   Personal Goals Review Hypertension;Lipids;Weight Management/Obesity Hypertension;Lipids;Weight Management/Obesity Hypertension;Lipids;Weight Management/Obesity Hypertension;Weight Management/Obesity    Review Patient does have a BP cuff at home, but has not been checking her BP at home on a regular basis. She was informed that this would be a good way to make sure she is within her normal range. She is happy with her current weight but wouldn't mind losing a few pounds. We spoke about how exercising more regularly at home in addition to rehab could help with weight loss. Veronica Romero well in rehab. Her weight is holding steady (up 2-3 down 2-3 lb).  She is feeling pretty good overall.  Her pressures are doing well in class, but she is not checking them at home.  She just had a med change, but has not picked it up yet.  She is going to start a new BP med today and doctor warned her about feeling lightheaded with it.  She will bring it in on Thursday. Veronica Romero is doing well in rehab.  Her weight is steady.  She would like to Romero weight.  Her pressures continue to do well in class.  She is doing well with her new BP med and not noticed any side effects from it.  She does check it at home on [place]. Renesmae is nearing graduation.  Her weight is steady.  Her pressures have continued to do well.  She continues to keep eye on her risk factors.    Expected Outcomes Short: begin checking BP at home. Long: continue to attend rehab and exercise at home for weight loss. Short; Start new med Long; continue to [place] risk factors. Short: Continue to work on weight loss Long: Continue to monitor risk factors Continue to monitor risk factors.             Core Components/Risk Factors/Patient Goals at Discharge (Final Review):    Goals and Risk Factor Review - 02/06/22 1027       Core Components/Risk Factors/Patient Goals Review   Personal Goals Review Hypertension;Weight Management/Obesity    Review Gracynn is nearing graduation.  Her weight is steady.  Her pressures have continued to do well.  She continues to keep eye on her risk factors.    Expected Outcomes Continue to monitor risk factors.             ITP Comments:  ITP Comments     Row Name 10/06/21 1331 10/17/21 1159 10/24/21 0956 11/08/21 0955 11/29/21 1148   ITP Comments Virtual orientation call completed today. shehas an appointment on Date: 10/17/2021  for EP eval and gym Orientation.  Documentation of diagnosis can be found in [facility] Date: 07/17/2021 . Completed 6MWT and gym orientation. Initial ITP created and sent for review to Dr. Emily Filbert, Medical Director. First full day of exercise!  Patient was oriented to gym and equipment including functions, settings, policies, and procedures.  Patient's individual exercise prescription and treatment plan were reviewed.  All starting workloads were established based on the results of the 6 minute walk test done at initial orientation visit.  The plan for exercise progression was also introduced and progression will be customized based on patient's performance and goals. 30 Day review completed. Medical Director ITP review done, changes made as directed, and signed approval by Medical Director. Completed initial RD consultation    Row Name 12/06/21 0755 01/03/22 0754 01/31/22 0740 02/28/22 0953     ITP Comments 30 Day review completed. Medical Director ITP review done, changes made as directed, and signed approval by Medical Director. 30 Day review completed. Medical Director ITP review done, changes made as directed, and signed approva30 Day review completed. Medical Director ITP review done, changes made as directed, and signed approval by Medical Director.l by Medical Director. 30 Day review completed. Medical  Director ITP review done, changes made as directed, and signed approval by Medical Director. 30 Day review completed. Medical Director ITP review done, changes made as directed, and signed approval by Medical Director.             Comments:

## 2022-03-01 ENCOUNTER — Encounter: Payer: Medicare Other | Attending: Cardiology | Admitting: *Deleted

## 2022-03-01 ENCOUNTER — Ambulatory Visit: Payer: Medicare Other | Attending: Family | Admitting: Family

## 2022-03-01 ENCOUNTER — Encounter: Payer: Self-pay | Admitting: Family

## 2022-03-01 ENCOUNTER — Telehealth: Payer: Self-pay | Admitting: Family

## 2022-03-01 VITALS — BP 144/65 | HR 84 | Resp 18 | Ht 67.0 in | Wt 164.4 lb

## 2022-03-01 DIAGNOSIS — M05741 Rheumatoid arthritis with rheumatoid factor of right hand without organ or systems involvement: Principal | ICD-10-CM

## 2022-03-01 DIAGNOSIS — M059 Rheumatoid arthritis with rheumatoid factor, unspecified: Principal | ICD-10-CM

## 2022-03-01 DIAGNOSIS — M8589 Other specified disorders of bone density and structure, multiple sites: Principal | ICD-10-CM

## 2022-03-01 DIAGNOSIS — M05742 Rheumatoid arthritis with rheumatoid factor of left hand without organ or systems involvement: Principal | ICD-10-CM

## 2022-03-01 DIAGNOSIS — M0579 Rheumatoid arthritis with rheumatoid factor of multiple sites without organ or systems involvement: Principal | ICD-10-CM

## 2022-03-01 DIAGNOSIS — R0602 Shortness of breath: Secondary | ICD-10-CM | POA: Diagnosis not present

## 2022-03-01 DIAGNOSIS — I110 Hypertensive heart disease with heart failure: Secondary | ICD-10-CM | POA: Insufficient documentation

## 2022-03-01 DIAGNOSIS — I5022 Chronic systolic (congestive) heart failure: Secondary | ICD-10-CM | POA: Diagnosis present

## 2022-03-01 DIAGNOSIS — I214 Non-ST elevation (NSTEMI) myocardial infarction: Secondary | ICD-10-CM | POA: Diagnosis not present

## 2022-03-01 DIAGNOSIS — I2510 Atherosclerotic heart disease of native coronary artery without angina pectoris: Secondary | ICD-10-CM | POA: Diagnosis not present

## 2022-03-01 DIAGNOSIS — I10 Essential (primary) hypertension: Secondary | ICD-10-CM

## 2022-03-01 DIAGNOSIS — Z7984 Long term (current) use of oral hypoglycemic drugs: Secondary | ICD-10-CM | POA: Diagnosis not present

## 2022-03-01 DIAGNOSIS — Z79899 Other long term (current) drug therapy: Secondary | ICD-10-CM | POA: Diagnosis not present

## 2022-03-01 DIAGNOSIS — I255 Ischemic cardiomyopathy: Secondary | ICD-10-CM

## 2022-03-01 DIAGNOSIS — M0510 Rheumatoid lung disease with rheumatoid arthritis of unspecified site: Secondary | ICD-10-CM | POA: Insufficient documentation

## 2022-03-01 DIAGNOSIS — J8410 Pulmonary fibrosis, unspecified: Secondary | ICD-10-CM

## 2022-03-01 DIAGNOSIS — D649 Anemia, unspecified: Secondary | ICD-10-CM | POA: Insufficient documentation

## 2022-03-01 DIAGNOSIS — I5020 Unspecified systolic (congestive) heart failure: Secondary | ICD-10-CM | POA: Diagnosis not present

## 2022-03-01 DIAGNOSIS — E119 Type 2 diabetes mellitus without complications: Secondary | ICD-10-CM | POA: Diagnosis not present

## 2022-03-01 DIAGNOSIS — Z955 Presence of coronary angioplasty implant and graft: Secondary | ICD-10-CM | POA: Diagnosis present

## 2022-03-01 DIAGNOSIS — I272 Pulmonary hypertension, unspecified: Secondary | ICD-10-CM | POA: Insufficient documentation

## 2022-03-01 MED ORDER — LEUCOVORIN CALCIUM 5 MG TABLET
ORAL_TABLET | ORAL | 6 refills | 84 days | Status: CP
Start: 2022-03-01 — End: ?

## 2022-03-01 MED ORDER — ENTRESTO 24-26 MG PO TABS: 1.0000 | ORAL_TABLET | Freq: Two times a day (BID) | ORAL | 3 refills | Status: DC

## 2022-03-01 NOTE — Patient Instructions (Addendum)
Continue weighing daily and call for an overnight weight gain of 3 pounds or more or a weekly weight gain of more than 5 pounds.   If you have voicemail, please make sure your mailbox is cleaned out so that we may leave a message and please make sure to listen to any voicemails.    Increase your spironolactone to 1 tablet every morning and stop taking potassium.

## 2022-03-01 NOTE — Progress Notes (Signed)
Daily Session Note  Patient Details  Name: Veronica Romero MRN: [id number] Date of Birth: 1950-10-02 Referring Provider:   Flowsheet Row Cardiac Rehab from 10/17/2021 in [facility]  Referring Provider Berniece Salines       Encounter Date: 03/01/2022  Check In:  Session Check In - 03/01/22 1539       Check-In   Supervising physician immediately available to respond to emergencies See telemetry face sheet for immediately available ER MD    Location [facility]-Cardiac & Pulmonary Rehab    Staff Present Renita Papa, RN BSN;Joseph Tessie Fass, RCP,RRT,BSRT;Noah Buna, [place], Exercise Physiologist    Virtual Visit No    Medication changes reported     No    Fall or balance concerns reported    No    Warm-up and Cool-down Performed on first and last piece of equipment    Resistance Training Performed Yes    VAD Patient? No    PAD/SET Patient? No      Pain Assessment   Currently in Pain? No/denies                Social History   Tobacco Use  Smoking Status Never  Smokeless Tobacco Never    Goals Met:  Independence with exercise equipment Exercise tolerated well No report of concerns or symptoms today Strength training completed today  Goals Unmet:  Not Applicable  Comments:  [name] graduated today from  rehab with 36 sessions completed.  Details of the patient's exercise prescription and what He needs to do in order to continue the prescription and progress were discussed with patient.  Patient was given a copy of prescription and goals.  Patient verbalized understanding.  [name] plans to continue to exercise by walking.     Dr. Emily Filbert is Medical Director for [facility].  Dr. Ottie Glazier is Medical Director for [facility] [place] [facility].

## 2022-03-01 NOTE — Progress Notes (Signed)
Discharge Summary  Veronica Romero  DOB: 06/04/1950  Veronica Romero graduated today from  rehab with 36 sessions completed.  Details of the patient's exercise prescription and what He needs to do in order to continue the prescription and progress were discussed with patient.  Patient was given a copy of prescription and goals.  Patient verbalized understanding.  Veronica Romero plans to continue to exercise by walking.   6 Minute Walk     Row Name 10/17/21 1200 02/06/22 1008       6 Minute Walk   Phase Initial Discharge    Distance 850 feet 1055 feet    Distance % Change -- 24.1 %    Distance Feet Change -- 205 ft    Walk Time 6 minutes 6 minutes    # of Rest Breaks 0 0    MPH 1.6 1.99    METS 2.19 2.56    RPE 9 13    Perceived Dyspnea  0 1    VO2 Peak 7.68 8.99    Symptoms Yes (comment) Yes (comment)    Comments arthritic foot pain 4/10 SOB, arthritis foot pain 3/10    Resting HR 69 bpm 88 bpm    Resting BP 136/74 122/62    Resting Oxygen Saturation  98 % 96 %    Exercise Oxygen Saturation  during 6 min walk 97 % 95 %    Max Ex. HR 100 bpm 106 bpm    Max Ex. BP 142/70 146/70    2 Minute Post BP 130/80 --

## 2022-03-01 NOTE — Progress Notes (Signed)
Patient ID: Veronica Romero, female    DOB: 1950-08-01, 71 y.o.   MRN: [id number]  HPI  [name] is a 71 y/o female with a history of DM, HTN, ILD, anemia, RA , CAD with PCI to LAD and chronic heart failure.   Echo report from 11/07/21 reviewed and showed an EF of 40-45% along with moderate LAE, moderate AR and mild aortic dilatation.   RHC/LHC done 06/16/21 and showed:   Ost LAD to Mid LAD lesion is 85% stenosed.   Prox RCA lesion is 50% stenosed.   LPAV lesion is 50% stenosed.   There is mild to moderate left ventricular systolic dysfunction.   LV end diastolic pressure is normal.   LV end diastolic pressure is normal.   The left ventricular ejection fraction is 35-45% by visual estimate.   Hemodynamic findings consistent with mild pulmonary hypertension.  CONCLUSIONS: Calcified segmental 85% proximal LAD Dominant circumflex without significant obstruction.  Distally before the PDA there is segmental 50% narrowing. Left main is widely patent Right coronary was poorly visualized, is nondominant, and contains mid 50% stenosis. Low normal to mild systolic dysfunction with EF in the 35 to 45% range.  LVEDP 14 mmHg. Mild pulmonary hypertension with mean PA pressure 22 mmHg; mean wedge pressure 14 mmHg; right atrial mean 6 mmHg.  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  Admitted 07/17/21 for stent placement proximal to mid LAD with orbital atherectomy and DES x 1 with OCT guidance. Discharged the following day.   She presents today for a follow-up visit with a chief complaint of minimal shortness of breath with moderate exertion. Describes this as chronic in nature although slightly better from last visit. She has associated fatigue, chronic difficulty sleeping and foot pain along with this. She denies any dizziness, abdominal distention, palpitations, pedal edema, chest pain, cough or weight gain.   She is participating in cardiac rehab and is really enjoying this. Her husband is currently in a  SNF in [place] after a recent hospitalization.   Drinks 1 red bull daily along with 2 drinks of either wine or vodka.   Past Medical History:  Diagnosis Date   Anemia    CHF (congestive heart failure) (HCC)    Coronary artery disease    Diabetes mellitus without complication (HCC)    Hypertension    ILD (interstitial lung disease) ([place])    Ischemic cardiomyopathy 06/12/2021   EF 25-30%   Pulmonary HTN (HCC)    Rheumatoid arthritis ([place])    Past Surgical History:  Procedure Laterality Date   BREAST BIOPSY Left    ?when//benign   CORONARY ANGIOPLASTY     CORONARY ATHERECTOMY N/A 07/17/2021   Procedure: CORONARY ATHERECTOMY;  Surgeon: Martinique, Peter M, MD;  Location: [place] [facility];  Service: Cardiovascular;  Laterality: N/A;   RIGHT/LEFT HEART CATH AND CORONARY ANGIOGRAPHY N/A 06/16/2021   Procedure: RIGHT/LEFT HEART CATH AND CORONARY ANGIOGRAPHY;  Surgeon: Belva Crome, MD;  Location: [place] [facility];  Service: Cardiovascular;  Laterality: N/A;   Family History  Problem Relation Age of Onset   Heart disease Father    Kidney disease Brother    Autoimmune disease Daughter    Social History   Tobacco Use   Smoking status: Never   Smokeless tobacco: Never  Substance Use Topics   Alcohol use: Yes    Alcohol/week: 2.0 standard drinks of alcohol    Types: 2 Glasses of wine per week   Allergies  Allergen Reactions  Penicillins Rash   Prior to Admission medications   Medication Sig Start Date End Date Taking? Authorizing Provider  Adalimumab (HUMIRA PEN) 40 MG/0.4ML PNKT Inject 0.4 mLs into the skin every Tuesday. Tuesday 04/10/21  Yes [provider]  aspirin EC 81 MG tablet Take 1 tablet (81 mg total) by mouth daily. Swallow whole. 07/18/21  Yes Arty Baumgartner, NP  carvedilol (COREG) 3.125 MG tablet Take 1 tablet (3.125 mg total) by mouth 2 (two) times daily with a meal. 08/14/21  Yes Tobb, Kardie, DO  Cholecalciferol (VITAMIN D3 PO) Take 1 tablet by mouth  daily.   Yes [provider]  clopidogrel (PLAVIX) 75 MG tablet Take 1 tablet (75 mg total) by mouth daily. 07/18/21  Yes Arty Baumgartner, NP  dapagliflozin propanediol (FARXIGA) 10 MG TABS tablet Take 1 tablet (10 mg total) by mouth daily before breakfast. 01/29/22  Yes Clarisa Kindred A, FNP  furosemide (LASIX) 40 MG tablet Take 20 mg by mouth daily. Take 1/2 tablet daily.   Yes [provider]  leucovorin (WELLCOVORIN) 5 MG tablet Take 15 mg by mouth every Saturday. 04/07/21  Yes [provider]  methotrexate 50 MG/2ML injection Inject 1 mL (25 mg total) into the muscle every Saturday. Hold methotrexate for 2 more weeks and then resume 3/10 07/22/21  Yes Arty Baumgartner, NP  Multiple Vitamin (MULTIVITAMIN WITH MINERALS) TABS tablet Take 1 tablet by mouth daily.   Yes [provider]  nitroGLYCERIN (NITROSTAT) 0.4 MG SL tablet Place 1 tablet (0.4 mg total) under the tongue every 5 (five) minutes as needed. 07/18/21  Yes Arty Baumgartner, NP  Polyethyl Glycol-Propyl Glycol (LUBRICANT EYE DROPS) 0.4-0.3 % SOLN Place 1 drop into both eyes 3 (three) times daily as needed (dry/irritated eyes.).   Yes [provider]  potassium chloride SA (KLOR-CON M) 20 MEQ tablet Take 1 tablet (20 mEq total) by mouth daily. 12/11/21  Yes Tobb, Kardie, DO  predniSONE (DELTASONE) 5 MG tablet Take 10 mg by mouth in the morning. 04/12/21  Yes [provider]  rosuvastatin (CRESTOR) 40 MG tablet Take 1 tablet (40 mg total) by mouth daily. 01/15/22  Yes Tobb, Kardie, DO  sacubitril-valsartan (ENTRESTO) 24-26 MG Take 1 tablet by mouth 2 (two) times daily. 12/11/21  Yes Tobb, Kardie, DO  spironolactone (ALDACTONE) 25 MG tablet Take 1/2 tablet (12.5 mg total) by mouth daily. 02/05/22  Yes Jake Bathe, MD  traMADol (ULTRAM) 50 MG tablet Take 50 mg by mouth every 6 (six) hours as needed (pain.).   Yes [provider]    Review of Systems  Constitutional:  Positive  for fatigue. Negative for appetite change.  HENT:  Negative for congestion, postnasal drip and sore throat.   Eyes: Negative.   Respiratory:  Positive for shortness of breath (worsens with anxiety). Negative for cough and chest tightness.   Cardiovascular:  Negative for chest pain, palpitations and leg swelling.  Gastrointestinal:  Negative for abdominal distention.  Endocrine: Negative.   Genitourinary: Negative.   Musculoskeletal:  Positive for arthralgias (foot pain). Negative for back pain.  Skin: Negative.   Allergic/Immunologic: Negative.   Neurological:  Negative for dizziness and light-headedness.  Hematological:  Negative for adenopathy. Does not bruise/bleed easily.  Psychiatric/Behavioral:  Positive for sleep disturbance (chronic trouble sleeping). Negative for dysphoric mood. The patient is nervous/anxious.    Vitals:   03/01/22 1023  BP: (!) 144/65  Pulse: 84  Resp: 18  SpO2: 100%  Weight: 164 lb  6 oz (74.6 kg)  Height: 5\' 7"  (1.702 m)   Wt Readings from Last 3 Encounters:  03/01/22 164 lb 6 oz (74.6 kg)  02/06/22 165 lb 1.6 oz (74.9 kg)  01/29/22 167 lb (75.8 kg)   Lab Results  Component Value Date   CREATININE 0.74 01/17/2022   CREATININE 0.89 09/08/2021   CREATININE 0.94 07/18/2021   Physical Exam Vitals and nursing note reviewed.  Constitutional:      Appearance: Normal appearance.  HENT:     Head: Normocephalic and atraumatic.  Cardiovascular:     Rate and Rhythm: Normal rate and regular rhythm.  Pulmonary:     Effort: Pulmonary effort is normal. No respiratory distress.     Breath sounds: No wheezing or rales.  Abdominal:     General: There is no distension.     Palpations: Abdomen is soft.     Tenderness: There is no abdominal tenderness.  Musculoskeletal:        General: Deformity (both hands with severe RA in fingers) present. No tenderness.     Cervical back: Normal range of motion and neck supple.     Right lower leg: No edema.     Left  lower leg: No edema.  Skin:    General: Skin is warm and dry.  Neurological:     General: No focal deficit present.     Mental Status: She is alert and oriented to person, place, and time.  Psychiatric:        Mood and Affect: Mood is anxious.        Behavior: Behavior normal.        Thought Content: Thought content normal.   Assessment & Plan:  1: Chronic heart failure with mildly reduced ejection fraction- - NYHA class II - euvolemic today - weighing daily; reminded to call for an overnight weight gain of > 2 pounds or a weekly weight gain of > 5 pounds - weight down 3 pounds from last visit 1 month ago - occasionally adds salt when cooking but doesn't add it afterwards - saw cardiology (Tobb) 12/11/21; plan is to recheck echo in ~ 6 months - on GDMT of carvedilol, entresto, farxiga and spironolactone - increase spironolactone to 25mg  daily and stop potassium supplements - check BMP at next visit with cardiology in December - taking furosemide 20mg  daily with additional 20mg  if needed - entresto samples provided today - participating in cardiac rehab - encouraged her to not drink the red bull or the vodka; she says that she uses the red bull to wake herself up and the alcohol to relax herself - BNP 06/21/21 was 134.5 - did receive her flu vaccine for this season - PharmD reconciled medications with the patient  2: HTN- - BP mildly elevated (144/65) but she hasn't taken her medications yet today - saw PCP January) 01/17/22 - BMP 01/17/22 reviewed and showed sodium 140, potassium 4.4, creatinine 0.74 and GFR 86  3: DM- - A1c 06/12/21 was 5.4%  4: 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 Each medication was verbally reviewed with the patient and she was encouraged to bring the bottles to every visit to confirm accuracy of list.  Since she has  cardiology f/u next month, will ask them to check BMP and then we will see her in January. Sooner if needed.

## 2022-03-01 NOTE — Progress Notes (Signed)
Cardiac Individual Treatment Plan  Patient Details  Name: Veronica Romero MRN: [id number] Date of Birth: 1950-12-25 Referring Provider:   Flowsheet Row Cardiac Rehab from 10/17/2021 in [facility] Cardiac and Pulmonary Rehab  Referring Provider Berniece Salines       Initial Encounter Date:  Flowsheet Row Cardiac Rehab from 10/17/2021 in [facility] Cardiac and Pulmonary Rehab  Date 10/17/21       Visit Diagnosis: NSTEMI (non-ST elevation myocardial infarction) [facility])  Status post coronary artery stent placement  Patient's Home Medications on Admission:  Current Outpatient Medications:    Adalimumab (HUMIRA PEN) 40 MG/0.4ML PNKT, Inject 0.4 mLs into the skin every Tuesday. Tuesday, Disp: , Rfl:    aspirin EC 81 MG tablet, Take 1 tablet (81 mg total) by mouth daily. Swallow whole., Disp: 90 tablet, Rfl: 1   carvedilol (COREG) 3.125 MG tablet, Take 1 tablet (3.125 mg total) by mouth 2 (two) times daily with a meal., Disp: 180 tablet, Rfl: 2   Cholecalciferol (VITAMIN D3 PO), Take 1 tablet by mouth daily., Disp: , Rfl:    clopidogrel (PLAVIX) 75 MG tablet, Take 1 tablet (75 mg total) by mouth daily., Disp: 90 tablet, Rfl: 2   dapagliflozin propanediol (FARXIGA) 10 MG TABS tablet, Take 1 tablet (10 mg total) by mouth daily before breakfast., Disp: 90 tablet, Rfl: 3   furosemide (LASIX) 40 MG tablet, Take 20 mg by mouth daily. Take 1/2 tablet daily., Disp: , Rfl:    leucovorin (WELLCOVORIN) 5 MG tablet, Take 15 mg by mouth every Saturday., Disp: , Rfl:    methotrexate 50 MG/2ML injection, Inject 1 mL (25 mg total) into the muscle every Saturday. Hold methotrexate for 2 more weeks and then resume 3/10, Disp: , Rfl:    Multiple Vitamin (MULTIVITAMIN WITH MINERALS) TABS tablet, Take 1 tablet by mouth daily., Disp: , Rfl:    nitroGLYCERIN (NITROSTAT) 0.4 MG SL tablet, Place 1 tablet (0.4 mg total) under the tongue every 5 (five) minutes as needed., Disp: 25 tablet, Rfl: 2   Polyethyl Glycol-Propyl Glycol  (LUBRICANT EYE DROPS) 0.4-0.3 % SOLN, Place 1 drop into both eyes 3 (three) times daily as needed (dry/irritated eyes.)., Disp: , Rfl:    predniSONE (DELTASONE) 5 MG tablet, Take 10 mg by mouth in the morning., Disp: , Rfl:    rosuvastatin (CRESTOR) 40 MG tablet, Take 1 tablet (40 mg total) by mouth daily., Disp: 90 tablet, Rfl: 3   sacubitril-valsartan (ENTRESTO) 24-26 MG, Take 1 tablet by mouth 2 (two) times daily., Disp: 180 tablet, Rfl: 3   spironolactone (ALDACTONE) 25 MG tablet, Take 1/2 tablet (12.5 mg total) by mouth daily. (Patient taking differently: Take 25 mg by mouth daily.), Disp: 30 tablet, Rfl: 2   traMADol (ULTRAM) 50 MG tablet, Take 50 mg by mouth every 6 (six) hours as needed (pain.)., Disp: , Rfl:   Past Medical History: Past Medical History:  Diagnosis Date   Anemia    CHF (congestive heart failure) (HCC)    Coronary artery disease    Diabetes mellitus without complication ([place])    Hypertension    ILD (interstitial lung disease) ([place])    Ischemic cardiomyopathy 06/12/2021   EF 25-30%   Pulmonary HTN (HCC)    Rheumatoid arthritis ([place])     Tobacco Use: Social History   Tobacco Use  Smoking Status Never  Smokeless Tobacco Never    Labs: Review Flowsheet       Latest Ref Rng & Units 06/12/2021 06/13/2021 06/16/2021 01/17/2022  Labs  for ITP Cardiac and Pulmonary Rehab  Cholestrol 100 - 199 mg/dL - - 177  172   LDL (calc) 0 - 99 mg/dL - - 116  64   HDL-C >39 mg/dL - - 38  91   Trlycerides 0 - 149 mg/dL - 66  115  96   Hemoglobin A1c 4.8 - 5.6 % 5.4  - - -  PH, Arterial 7.35 - 7.45 7.451  7.155  7.359  7.471  7.518  7.507  7.513  -  PCO2 arterial 32 - 48 mmHg 39.0  72.5  41.8  29.8  37.0  38.0  39.7  -  Bicarbonate 20.0 - 28.0 mmol/L 26.6  24.7  23.7  22.0  30.1  30.1  31.9  -  TCO2 22 - 32 mmol/L _0 33  -  Acid-base deficit 0.0 - 2.0 mmol/L 4.0  2.0  1.0  - -  O2 Saturation % 100.0  94.0  97.0  96.0  94  70  68  -     Exercise Target  Goals: Exercise Program Goal: Individual exercise prescription set using results from initial 6 min walk test and THRR while considering  patient's activity barriers and safety.   Exercise Prescription Goal: Initial exercise prescription builds to 30-45 minutes a day of aerobic activity, 2-3 days per week.  Home exercise guidelines will be given to patient during program as part of exercise prescription that the participant will acknowledge.   Education: Aerobic Exercise: - Group verbal and visual presentation on the components of exercise prescription. Introduces F.I.T.T principle from ACSM for exercise prescriptions.  Reviews F.I.T.T. principles of aerobic exercise including progression. Written material given at graduation. Flowsheet Row Cardiac Rehab from 10/17/2021 in [facility] Cardiac and Pulmonary Rehab  Education need identified 10/17/21       Education: Resistance Exercise: - Group verbal and visual presentation on the components of exercise prescription. Introduces F.I.T.T principle from ACSM for exercise prescriptions  Reviews F.I.T.T. principles of resistance exercise including progression. Written material given at graduation.    Education: Exercise & Equipment Safety: - Individual verbal instruction and demonstration of equipment use and safety with use of the equipment. Flowsheet Row Cardiac Rehab from 10/17/2021 in [facility] Cardiac and Pulmonary Rehab  Date 10/17/21  Educator [facility]  Instruction Review Code 1- Verbalizes Understanding       Education: Exercise Physiology & General Exercise Guidelines: - Group verbal and written instruction with models to review the exercise physiology of the cardiovascular system and associated critical values. Provides general exercise guidelines with specific guidelines to those with heart or lung disease.    Education: Flexibility, Balance, Mind/Body Relaxation: - Group verbal and visual presentation with interactive activity on the components  of exercise prescription. Introduces F.I.T.T principle from ACSM for exercise prescriptions. Reviews F.I.T.T. principles of flexibility and balance exercise training including progression. Also discusses the mind body connection.  Reviews various relaxation techniques to help reduce and manage stress (i.e. Deep breathing, progressive muscle relaxation, and visualization). Balance handout provided to take home. Written material given at graduation.   Activity Barriers & Risk Stratification:  Activity Barriers & Cardiac Risk Stratification - 10/06/21 1316       Activity Barriers & Cardiac Risk Stratification   Activity Barriers Arthritis    Cardiac Risk Stratification Moderate             6 Minute Walk:  6 Minute Walk     Row  Name 10/17/21 1200 02/06/22 1008       6 Minute Walk   Phase Initial Discharge    Distance 850 feet 1055 feet    Distance % Change -- 24.1 %    Distance Feet Change -- 205 ft    Walk Time 6 minutes 6 minutes    # of Rest Breaks 0 0    MPH 1.6 1.99    METS 2.19 2.56    RPE 9 13    Perceived Dyspnea  0 1    VO2 Peak 7.68 8.99    Symptoms Yes (comment) Yes (comment)    Comments arthritic foot pain 4/10 SOB, arthritis foot pain 3/10    Resting HR 69 bpm 88 bpm    Resting BP 136/74 122/62    Resting Oxygen Saturation  98 % 96 %    Exercise Oxygen Saturation  during 6 min walk 97 % 95 %    Max Ex. HR 100 bpm 106 bpm    Max Ex. BP 142/70 146/70    2 Minute Post BP 130/80 --             Oxygen Initial Assessment:   Oxygen Re-Evaluation:   Oxygen Discharge (Final Oxygen Re-Evaluation):   Initial Exercise Prescription:  Initial Exercise Prescription - 10/17/21 1200       Date of Initial Exercise RX and Referring Provider   Date 10/17/21    Referring Provider Godfrey Pick Tobb      Oxygen   Maintain Oxygen Saturation 88% or higher      Recumbant Bike   Level 1    RPM 50    Minutes 15    METs 2.19      NuStep   Level 1    SPM 80     Minutes 15    METs 2.19      REL-XR   Level 1    Speed 50    Minutes 15    METs 2.19      Track   Laps 22    Minutes 15    METs 2.2      Prescription Details   Frequency (times per week) 2    Duration Progress to 30 minutes of continuous aerobic without signs/symptoms of physical distress      Intensity   THRR 40-80% of Max Heartrate 100-132    Ratings of Perceived Exertion 11-13    Perceived Dyspnea 0-4      Progression   Progression Continue to progress workloads to maintain intensity without signs/symptoms of physical distress.      Resistance Training   Training Prescription Yes    Weight 2    Reps 10-15             Perform Capillary Blood Glucose checks as needed.  Exercise Prescription Changes:   Exercise Prescription Changes     Row Name 10/17/21 1200 11/07/21 1500 11/09/21 1600 11/20/21 1400 12/04/21 1500     Response to Exercise   Blood Pressure (Admit) 136/74 120/62 -- 116/60 122/58   Blood Pressure (Exercise) 142/70 122/82 -- 134/62 132/60   Blood Pressure (Exit) 130/80 118/58 -- 122/70 118/60   Heart Rate (Admit) 69 bpm 70 bpm -- 100 bpm 80 bpm   Heart Rate (Exercise) 100 bpm 118 bpm -- 112 bpm 105 bpm   Heart Rate (Exit) 74 bpm 89 bpm -- 92 bpm 92 bpm   Oxygen Saturation (Admit) 98 % -- -- -- --   Oxygen Saturation (Exercise)  97 % -- -- -- --   Oxygen Saturation (Exit) 98 % -- -- -- --   Rating of Perceived Exertion (Exercise) 9 12 -- 14 12   Perceived Dyspnea (Exercise) 0 -- -- -- --   Symptoms foot pain (4/10) foot pain -- foot pain none   Comments 6 MWT results -- -- -- --   Duration -- Progress to 30 minutes of  aerobic without signs/symptoms of physical distress -- Continue with 30 min of aerobic exercise without signs/symptoms of physical distress. Continue with 30 min of aerobic exercise without signs/symptoms of physical distress.   Intensity -- THRR unchanged -- THRR unchanged THRR unchanged     Progression   Progression -- Continue  to progress workloads to maintain intensity without signs/symptoms of physical distress. -- Continue to progress workloads to maintain intensity without signs/symptoms of physical distress. Continue to progress workloads to maintain intensity without signs/symptoms of physical distress.   Average METs -- 2.06 -- 2.62 2.77     Resistance Training   Training Prescription -- Yes -- Yes Yes   Weight -- 3 lb -- 3 lb 3 lb   Reps -- 10-15 -- 10-15 10-15     Interval Training   Interval Training -- No -- No No     Recumbant Bike   Level -- -- -- -- 1.5   Minutes -- -- -- -- 15   METs -- -- -- -- 3.09     NuStep   Level -- 4 -- 4 3   Minutes -- 15 -- 15 15   METs -- 2.3 -- 2.9 2.4     REL-XR   Level -- 1 -- 3 3   Minutes -- 15 -- 15 15   METs -- -- -- 3.2 3.3     Track   Laps -- 15 -- 14 24   Minutes -- 15 -- 15 15   METs -- 1.82 -- 1.76 2.31     Home Exercise Plan   Plans to continue exercise at -- -- Home (comment)  walking, gym, staff videos Home (comment)  walking, gym, staff videos Home (comment)  walking, gym, staff videos   Frequency -- -- Add 2 additional days to program exercise sessions. Add 2 additional days to program exercise sessions. Add 2 additional days to program exercise sessions.   Initial Home Exercises Provided -- -- 11/09/21 11/09/21 11/09/21     Oxygen   Maintain Oxygen Saturation -- 88% or higher -- 88% or higher 88% or higher    Row Name 12/18/21 1400 01/02/22 0900 01/17/22 1400 01/29/22 1400 02/13/22 1500     Response to Exercise   Blood Pressure (Admit) 116/64 122/62 118/64 122/64 122/62   Blood Pressure (Exercise) -- -- -- -- 146/70   Blood Pressure (Exit) 126/64 128/70 102/60 122/62 122/64   Heart Rate (Admit) 86 bpm 63 bpm 74 bpm 84 bpm 82 bpm   Heart Rate (Exercise) 103 bpm 106 bpm 103 bpm 99 bpm 106 bpm   Heart Rate (Exit) 94 bpm 85 bpm 74 bpm 82 bpm 86 bpm   Oxygen Saturation (Admit) -- -- 95 % 94 % 96 %   Oxygen Saturation (Exercise) -- -- 93  % 91 % 91 %   Oxygen Saturation (Exit) -- -- 95 % 96 % 96 %   Rating of Perceived Exertion (Exercise) _0 --   Symptoms _1    Duration Continue with 30 min of aerobic exercise  without signs/symptoms of physical distress. Continue with 30 min of aerobic exercise without signs/symptoms of physical distress. Continue with 30 min of aerobic exercise without signs/symptoms of physical distress. Continue with 30 min of aerobic exercise without signs/symptoms of physical distress. Continue with 30 min of aerobic exercise without signs/symptoms of physical distress.   Intensity _0      Progression   Progression Continue to progress workloads to maintain intensity without signs/symptoms of physical distress. Continue to progress workloads to maintain intensity without signs/symptoms of physical distress. Continue to progress workloads to maintain intensity without signs/symptoms of physical distress. Continue to progress workloads to maintain intensity without signs/symptoms of physical distress. Continue to progress workloads to maintain intensity without signs/symptoms of physical distress.   Average METs 2.7 2.63 2.43 2.81 3.05     Resistance Training   Training Prescription _1    Weight 3 lb 3 lb 3 lb 3 lb 3 lb   Reps 10-15 10-15 10-15 10-15 10-15     Interval Training   Interval Training _2      Treadmill   MPH -- 1 1.4 1.2 --   Grade -- 0 0 0 --   Minutes -- _3 --   METs -- 1.77 2.07 1.92 --     Recumbant Bike   Level _4 1.8 2.1   Minutes _5 METs 3.09 3.09 -- 2.7 3.03     NuStep   Level _6 Minutes _7 METs 2.6 3 2.8 3.7 3.2     REL-XR   Level _8 -- --   Minutes _9 -- --   METs 4.4 3 -- -- --     Track   Laps 20 20 -- 20 --   Minutes 15 15 -- 15 --   METs 2.09 2.09 -- 2.09 --     Home Exercise Plan    Plans to continue exercise at Home (comment)  walking, gym, staff videos Home (comment)  walking, gym, staff videos Home (comment)  walking, gym, staff videos Home (comment)  walking, gym, staff videos Home (comment)  walking, gym, staff videos   Frequency Add 2 additional days to program exercise sessions. Add 2 additional days to program exercise sessions. Add 2 additional days to program exercise sessions. Add 2 additional days to program exercise sessions. Add 2 additional days to program exercise sessions.   Initial Home Exercises Provided 11/09/21 11/09/21 11/09/21 11/09/21 11/09/21     Oxygen   Maintain Oxygen Saturation 88% or higher 88% or higher 88% or higher 88% or higher 88% or higher    Row Name 02/26/22 1400             Response to Exercise   Blood Pressure (Admit) 132/70       Blood Pressure (Exit) 112/62       Heart Rate (Admit) 85 bpm       Heart Rate (Exercise) 104 bpm       Heart Rate (Exit) 87 bpm       Oxygen Saturation (Admit) 97 %       Oxygen Saturation (Exercise) 92 %       Oxygen Saturation (Exit) 95 %       Symptoms none       Duration Continue with 30 min  of aerobic exercise without signs/symptoms of physical distress.       Intensity THRR unchanged         Progression   Progression Continue to progress workloads to maintain intensity without signs/symptoms of physical distress.       Average METs 3.2         Resistance Training   Training Prescription Yes       Weight 3 lb       Reps 10-15         Interval Training   Interval Training No         Recumbant Bike   Level 2.2       Minutes 15       METs 2.9         NuStep   Level 6       Minutes 15       METs 4.3         REL-XR   Level 3       Minutes 15       METs 4.5         Track   Laps 30       Minutes 15       METs 2.63         Home Exercise Plan   Plans to continue exercise at Home (comment)  walking, gym, staff videos       Frequency Add 2 additional days to program  exercise sessions.       Initial Home Exercises Provided 11/09/21         Oxygen   Maintain Oxygen Saturation 88% or higher                Exercise Comments:   Exercise Comments     Row Name 10/24/21 0956           Exercise Comments First full day of exercise!  Patient was oriented to gym and equipment including functions, settings, policies, and procedures.  Patient's individual exercise prescription and treatment plan were reviewed.  All starting workloads were established based on the results of the 6 minute walk test done at initial orientation visit.  The plan for exercise progression was also introduced and progression will be customized based on patient's performance and goals.                Exercise Goals and Review:   Exercise Goals     Row Name 10/17/21 1206             Exercise Goals   Increase Physical Activity Yes       Intervention Provide advice, education, support and counseling about physical activity/exercise needs.;Develop an individualized exercise prescription for aerobic and resistive training based on initial evaluation findings, risk stratification, comorbidities and participant's personal goals.       Expected Outcomes Short Term: Attend rehab on a regular basis to increase amount of physical activity.;Long Term: Add in home exercise to make exercise part of routine and to increase amount of physical activity.;Long Term: Exercising regularly at least 3-5 days a week.       Increase Strength and Stamina Yes       Intervention Provide advice, education, support and counseling about physical activity/exercise needs.;Develop an individualized exercise prescription for aerobic and resistive training based on initial evaluation findings, risk stratification, comorbidities and participant's personal goals.       Expected Outcomes Short Term: Increase workloads from initial exercise prescription for resistance, speed,  and METs.;Short Term: Perform  resistance training exercises routinely during rehab and add in resistance training at home;Long Term: Improve cardiorespiratory fitness, muscular endurance and strength as measured by increased METs and functional capacity (6MWT)       Able to understand and use rate of perceived exertion (RPE) scale Yes       Intervention Provide education and explanation on how to use RPE scale       Expected Outcomes Short Term: Able to use RPE daily in rehab to express subjective intensity level;Long Term:  Able to use RPE to guide intensity level when exercising independently       Able to understand and use Dyspnea scale Yes       Intervention Provide education and explanation on how to use Dyspnea scale       Expected Outcomes Short Term: Able to use Dyspnea scale daily in rehab to express subjective sense of shortness of breath during exertion;Long Term: Able to use Dyspnea scale to guide intensity level when exercising independently       Knowledge and understanding of Target Heart Rate Range (THRR) Yes       Intervention Provide education and explanation of THRR including how the numbers were predicted and where they are located for reference       Expected Outcomes Short Term: Able to state/look up THRR;Long Term: Able to use THRR to govern intensity when exercising independently;Short Term: Able to use daily as guideline for intensity in rehab       Able to check pulse independently Yes       Intervention Provide education and demonstration on how to check pulse in carotid and radial arteries.;Review the importance of being able to check your own pulse for safety during independent exercise       Expected Outcomes Short Term: Able to explain why pulse checking is important during independent exercise;Long Term: Able to check pulse independently and accurately       Understanding of Exercise Prescription Yes       Intervention Provide education, explanation, and written materials on patient's individual  exercise prescription       Expected Outcomes Short Term: Able to explain program exercise prescription;Long Term: Able to explain home exercise prescription to exercise independently                Exercise Goals Re-Evaluation :  Exercise Goals Re-Evaluation     Row Name 10/24/21 0956 11/07/21 1520 11/09/21 1614 11/16/21 1533 11/20/21 1436     Exercise Goal Re-Evaluation   Exercise Goals Review Increase Physical Activity;Able to understand and use rate of perceived exertion (RPE) scale;Knowledge and understanding of Target Heart Rate Range (THRR);Understanding of Exercise Prescription;Able to understand and use Dyspnea scale;Able to check pulse independently;Increase Strength and Stamina Increase Physical Activity;Increase Strength and Stamina;Understanding of Exercise Prescription Increase Physical Activity;Increase Strength and Stamina;Understanding of Exercise Prescription;Able to understand and use rate of perceived exertion (RPE) scale;Able to understand and use Dyspnea scale;Able to check pulse independently;Knowledge and understanding of Target Heart Rate Range (THRR) Increase Physical Activity;Increase Strength and Stamina;Understanding of Exercise Prescription Increase Physical Activity;Increase Strength and Stamina;Understanding of Exercise Prescription   Comments Reviewed RPE and dyspnea scales, THR and program prescription with pt today.  Pt voiced understanding and was given a copy of goals to take home. Shefali is off to a good start in rehab.  She has found shoes that she is tolerating, but walking still hurts her feet.  She is trying to do  one walking station and one seated each day.  She has surprised herself at how well she is doing.  She is already going on the seated equipment without stopping and only rest on track to give her feet a break. We will continue to monitor her progress. Reviewed home exercise with pt today.  Pt plans to walk and go up to gym in her apartment complex  at home for exercise.  We also talked about using our staff videos at home so that she doesn't have to leave her husband.  Reviewed THR, pulse, RPE, sign and symptoms, pulse oximetery and when to call 911 or MD.  Also discussed weather considerations and indoor options.  Pt voiced understanding. Namiko feels like she is doing well in the program. She states that she has noticed an increase in her endurance and energy since coming to rehab. Patient says she still can not leave her husband so finding time to exercise at home can be difficult. She does plan on checking out the gym at her apartment complex. Kaslyn is doing well in rehab.  She is up to level 4 on the NuStep.  Her feet are doing better than she thought they would in rehab.  She does walk some depending on how much her feet can tolerate.  She is still using 3 lb hand weights due to her grip.  We will continue to monitor her progress.   Expected Outcomes Short: Use RPE daily to regulate intensity. Long: Follow program prescription in THR. Short: Continue to attend rehab regularly Long: Continue to follow program prescription Short: Check out gym at complex and try staff videos Long: Continue to exercise independently Short: Check out gym at apartment complex. Long: Continue to exercise on days away from rehab. Short: Continue to walk at least once a week Long: Continue to improve stamina    Row Name 12/04/21 1554 12/12/21 0956 12/18/21 1441 01/02/22 0903 01/16/22 1019     Exercise Goal Re-Evaluation   Exercise Goals Review Increase Physical Activity;Increase Strength and Stamina;Understanding of Exercise Prescription Increase Physical Activity;Increase Strength and Stamina;Understanding of Exercise Prescription Increase Physical Activity;Increase Strength and Stamina;Understanding of Exercise Prescription Increase Physical Activity;Increase Strength and Stamina;Understanding of Exercise Prescription Increase Physical Activity;Increase Strength and  Stamina;Understanding of Exercise Prescription   Comments Suesan is doing well in rehab. She reached a max of 24 laps on the track! She hits her THR most sessions. RPEs are staying in good range. We will continue to monitor. Anira is doing well in rehab.  She feels like she is running in circles  when she tries to exercise at home, as her husband seems to always need something.  She has not tried the videos yet, so we talked about how her husband could do the chair exercises with her.  She does feel like she is getting better. Denesia continues to do well in rehab. She has kept her overall average MET level above 2.7 METs. She also improved to level 5 on the XR. Aleiah has also consistently reached 20 laps on the track. We will continue to monitor her progress in the program. Tiauna is doing well in rehab. She began using the treadmill and did well at 1 mph with no incline. She has also consistently walked over 20 laps on the track. She has done well with the T4 at level 4 as well. We will continue to monitor her progress in the program. Jericka is doing well in rehab.  She is feeling good with  exercise in class.  She has been having a hard time exercising at home with her husband.  We talked again about trying the staff videos so that she can do it at home.   Expected Outcomes Short: Continue to work up laps on track Long: Continue to increase overall MET level Short: Try to use videos Long: Continue to improve stamina Short: Continue to push for more laps on the track. Long: Continue to increase strength and stamina. Short: Continue to push for more laps on the track. Long: Continue to increase strength and stamina. Short: Try staff videos at home Long: Continue to improve stamina    Row Name 01/29/22 1409 02/06/22 1020 02/13/22 1540 02/26/22 1424       Exercise Goal Re-Evaluation   Exercise Goals Review Increase Physical Activity;Increase Strength and Stamina;Understanding of Exercise Prescription Increase  Physical Activity;Increase Strength and Stamina;Understanding of Exercise Prescription Increase Physical Activity;Increase Strength and Stamina;Understanding of Exercise Prescription Increase Physical Activity;Increase Strength and Stamina;Understanding of Exercise Prescription    Comments Tranika continues to do well in rehab. She recently increased her overall average MET level to 2.81 METs. She also improved to level 5 on the T4 and level 1.8 on the recumbent bike. We will continue to monitor her progress in the program. Skie improved her post 6MWT by 250 Ft!!  She has already gone over to the gym at her complex.  They have treadmill and bikes and weights.  She is also going to check to see if she has [employer] research officer, government with her insurances.  She is feeling stronger overall. Shaton continues to do well in rehab. She is getting very close to graduation. On top of her improving on her post 6MWT, she also increased to level 6 on the T4 Nustep. It is difficult for her to increase her handweights because of her RA. Her overall METS are now over 3. We will continue to monitor until she graduates. Latania is doing well in rehab. She increased her average overall MET level to 3.2 METs. She also has done well on the bike at level 2.2 and has gotten up to 30 laps on the track. We will continue to monitor her progress until she graduates.    Expected Outcomes Short: Continue to work on walking more. Long: Continue to increase strength and stamina. Short: Continue to work towards Long: Continue to improve stamina Short: Graduate Long: Continue to increase overall MET level Short: Graduate Long: Continue to increase overall MET level             Discharge Exercise Prescription (Final Exercise Prescription Changes):  Exercise Prescription Changes - 02/26/22 1400       Response to Exercise   Blood Pressure (Admit) 132/70    Blood Pressure (Exit) 112/62    Heart Rate (Admit) 85 bpm    Heart Rate (Exercise) 104 bpm     Heart Rate (Exit) 87 bpm    Oxygen Saturation (Admit) 97 %    Oxygen Saturation (Exercise) 92 %    Oxygen Saturation (Exit) 95 %    Symptoms none    Duration Continue with 30 min of aerobic exercise without signs/symptoms of physical distress.    Intensity THRR unchanged      Progression   Progression Continue to progress workloads to maintain intensity without signs/symptoms of physical distress.    Average METs 3.2      Resistance Training   Training Prescription Yes    Weight 3 lb    Reps 10-15  Interval Training   Interval Training No      Recumbant Bike   Level 2.2    Minutes 15    METs 2.9      NuStep   Level 6    Minutes 15    METs 4.3      REL-XR   Level 3    Minutes 15    METs 4.5      Track   Laps 30    Minutes 15    METs 2.63      Home Exercise Plan   Plans to continue exercise at Home (comment)   walking, gym, staff videos   Frequency Add 2 additional days to program exercise sessions.    Initial Home Exercises Provided 11/09/21      Oxygen   Maintain Oxygen Saturation 88% or higher             Nutrition:  Target Goals: Understanding of nutrition guidelines, daily intake of sodium <1534m, cholesterol <2082m calories 30% from fat and 7% or less from saturated fats, daily to have 5 or more servings of fruits and vegetables.  Education: All About Nutrition: -Group instruction provided by verbal, written material, interactive activities, discussions, models, and posters to present general guidelines for heart healthy nutrition including fat, fiber, MyPlate, the role of sodium in heart healthy nutrition, utilization of the nutrition label, and utilization of this knowledge for meal planning. Follow up email sent as well. Written material given at graduation. [facility]  Education need identified 10/17/21       Biometrics:  Pre Biometrics - 10/17/21 1207       Pre  Biometrics   Height _0  (1.676 m)    Weight 156 lb 6.4 oz (70.9 kg)    BMI (Calculated) 25.26    Single Leg Stand 4.53 seconds             Post Biometrics - 02/06/22 1007        Post  Biometrics   Height _1  (1.676 m)    Weight 165 lb 1.6 oz (74.9 kg)    BMI (Calculated) 26.66             Nutrition Therapy Plan and Nutrition Goals:  Nutrition Therapy & Goals - 11/29/21 0932       Nutrition Therapy   Diet Heart healthy, low Na    Drug/Food Interactions Statins/Certain Fruits    Protein (specify units) 85-95g    Fiber 25 grams    Whole Grain Foods 3 servings    Saturated Fats 12 max. grams    Fruits and Vegetables 8 servings/day    Sodium 2 grams      Personal Nutrition Goals   Nutrition Goal ST: include snacks in-between meals using examples provided and protien, fat, CHO structure LT: meet nutritional needs (calories and protein) while choosing heart healthy foods most often    Comments 89.o. F admitted to cardiac rehab s/p NSTEMI. PMHx includes "Moderate Malnutrition related to acute illness (PNA) as evidenced by moderate muscle depletion, mild fat depletion, moderate fat depletion." noted 06/12/21, rheumatoid arthritis, CAD, HFrEF, pulmonary HTN, HLD. Relevant medications includes vit D3, leucovorin, MVI, furosemide, K+, methotrexate, prednisone, crestor, tramadol. PYP Score: 71. Vegetables & Fruits 6/12. Breads, Grains & Cereals 7/12. Red & Processed Meat 10/12. Poultry 2/2. Fish & Shellfish 3/4. Beans, Nuts & Seeds 1/4. Milk & Dairy Foods 5/6. Toppings, Oils, Seasonings & Salt 16/20.  Sweets, Snacks & Restaurant Food 11/14. Beverages 10/10. Supriya reports eating this way for a while and reports no barriers aside from flavor building. B: Toast and sometimes an egg L: leftovers or fruit with yogurt - she reports not eating much D: she reports she will eat dinner most days: she reports not using lots of vegetables like she used to. She limits sweets for the most part.  Drinks: iced coffee in morning, red bull (she reports feeling tired), 1-2 glasses of wine, water. She reports difficulty sleeping and she will feel fatigued a lot of the day, she reports that exercise is helping. She reports having a lower appetite recently - she has enjoyed cooking and eating, but since her husband health has declined this past year, her appetite has been worse. She has tried protein drinks before, but she does not like them. When she was in the hospital in February her weight was 75.4kg and she is now 70.9kg; when she was in the hospital at that time she was diagnosed with moderate malnutrition as evidenced by moderate muscle depletion, mild fat depletion, moderate fat depletion. Encouraged mechanical eating, variety in foods, snacks in-between meals with protein, fat, and CHO, choosing heart healthy foods when possible while trying to meet calorie and protein needs.      Intervention Plan   Intervention Prescribe, educate and counsel regarding individualized specific dietary modifications aiming towards targeted core components such as weight, hypertension, lipid management, diabetes, heart failure and other comorbidities.    Expected Outcomes Short Term Goal: Understand basic principles of dietary content, such as calories, fat, sodium, cholesterol and nutrients.;Short Term Goal: A plan has been developed with personal nutrition goals set during dietitian appointment.;Long Term Goal: Adherence to prescribed nutrition plan.             Nutrition Assessments:  MEDIFICTS Score Key: ?70 Need to make dietary changes  40-70 Heart Healthy Diet ? 40 Therapeutic Level Cholesterol Diet  Flowsheet Row Cardiac Rehab from 02/22/2022 in [facility]  Picture Your Plate Total Score on Discharge 64      Picture Your Plate Scores: <62 Unhealthy dietary pattern with much room for improvement. 41-50 Dietary pattern unlikely to meet recommendations for good health and  room for improvement. 51-60 More healthful dietary pattern, with some room for improvement.  >60 Healthy dietary pattern, although there may be some specific behaviors that could be improved.    Nutrition Goals Re-Evaluation:  Nutrition Goals Re-Evaluation     [place]be Name 12/12/21 1000 01/16/22 1024 02/06/22 1025         Goals   Nutrition Goal ST: include snacks in-between meals using examples provided and protien, fat, CHO structure LT: meet nutritional needs (calories and protein) while choosing heart healthy foods most often Short: Try more protein snacks.  Long: Continue to focus on heart healthy eating Short: Continue to have good healthy snacks Long : continue to eat heart healthy     Comment Lizzett is doing well in rehab.  She met with dietitian last week.  She is going to try to get their snacking better and aim for protein snacks. She has given up on cooking as her husband won't eat much of her cooking and is very picky.  She tries to make healthier options. Callen is doing well in rehab.  She is trying to get better snacks still.  She is getting in more protein with yogurt and fish.  She continues to find more balance as she  is essentially cooking for one. Thornton Dales is doing well in rehab. She is nearing graduation.  She is still struggling with appetite.  She is either all out or not.  She is good about eating healthy when she does eat.     Expected Outcome Short: Try more protein snacks.  Long: Continue to focus on heart healthy eating Short: Continue to have good healthy snacks Long : continue to eat heart healthy Continue to focus on heart healthy eating              Nutrition Goals Discharge (Final Nutrition Goals Re-Evaluation):  Nutrition Goals Re-Evaluation - 02/06/22 1025       Goals   Nutrition Goal Short: Continue to have good healthy snacks Long : continue to eat heart healthy    Comment Thornton Dales is doing well in rehab. She is nearing graduation.  She is still struggling  with appetite.  She is either all out or not.  She is good about eating healthy when she does eat.    Expected Outcome Continue to focus on heart healthy eating             Psychosocial: Target Goals: Acknowledge presence or absence of significant depression and/or stress, maximize coping skills, provide positive support system. Participant is able to verbalize types and ability to use techniques and skills needed for reducing stress and depression.   Education: Stress, Anxiety, and Depression - Group verbal and visual presentation to define topics covered.  Reviews how body is impacted by stress, anxiety, and depression.  Also discusses healthy ways to reduce stress and to treat/manage anxiety and depression.  Written material given at graduation.   Education: Sleep Hygiene -Provides group verbal and written instruction about how sleep can affect your health.  Define sleep hygiene, discuss sleep cycles and impact of sleep habits. Review good sleep hygiene tips.    Initial Review & Psychosocial Screening:  Initial Psych Review & Screening - 10/06/21 1319       Initial Review   Current issues with Current Stress Concerns    Source of Stress Concerns Family    Comments Stress with husband needing total care.  Does have helpers every day so she can do what she needs to do.      Family Dynamics   Good Support System? Yes   daughter 2 grandkids     Barriers   Psychosocial barriers to participate in program The patient should benefit from training in stress management and relaxation.      Screening Interventions   Interventions Encouraged to exercise;To provide support and resources with identified psychosocial needs;Provide feedback about the scores to participant    Expected Outcomes Short Term goal: Utilizing psychosocial counselor, staff and physician to assist with identification of specific Stressors or current issues interfering with healing process. Setting desired goal for  each stressor or current issue identified.;Long Term Goal: Stressors or current issues are controlled or eliminated.;Short Term goal: Identification and review with participant of any Quality of Life or Depression concerns found by scoring the questionnaire.;Long Term goal: The participant improves quality of Life and PHQ9 Scores as seen by post scores and/or verbalization of changes             Quality of Life Scores:   Quality of Life - 02/22/22 1550       Quality of Life   Select Quality of Life      Quality of Life Scores   Health/Function Pre 11.36 %  Health/Function Post 16.17 %    Health/Function % Change 42.34 %    Socioeconomic Pre 13.13 %    Socioeconomic Post 12.93 %    Socioeconomic % Change  -1.52 %    Psych/Spiritual Pre 10.92 %    Psych/Spiritual Post 12.07 %    Psych/Spiritual % Change 10.53 %    Family Pre 20.63 %    Family Post 21.9 %    Family % Change 6.16 %    GLOBAL Pre 12.84 %    GLOBAL Post 15.5 %    GLOBAL % Change 20.72 %            Scores of 19 and below usually indicate a poorer quality of life in these areas.  A difference of  2-3 points is a clinically meaningful difference.  A difference of 2-3 points in the total score of the Quality of Life Index has been associated with significant improvement in overall quality of life, self-image, physical symptoms, and general health in studies assessing change in quality of life.  PHQ-9: Review Flowsheet  More data exists      02/22/2022 01/17/2022 01/16/2022 12/19/2021 11/23/2021  Depression screen PHQ 2/9  Decreased Interest 2 1 0 0 1  Down, Depressed, Hopeless _0 PHQ - 2 Score _1 Altered sleeping _2 Tired, decreased energy _3 Change in appetite _4 Feeling bad or failure about yourself  _5 Trouble concentrating _6 Moving slowly or fidgety/restless 0 0 0 0 0  Suicidal thoughts 0 0 0 0 0  PHQ-9 Score _7 Difficult doing  work/chores Somewhat difficult Somewhat difficult - Somewhat difficult Somewhat difficult   Interpretation of Total Score  Total Score Depression Severity:  1-4 = Minimal depression, 5-9 = Mild depression, 10-14 = Moderate depression, 15-19 = Moderately severe depression, 20-27 = Severe depression   Psychosocial Evaluation and Intervention:  Psychosocial Evaluation - 10/06/21 1326       Psychosocial Evaluation & Interventions   Interventions Encouraged to exercise with the program and follow exercise prescription    Comments Kennyth Lose has no barriers to attending the program. She does have stress at home in that her husband requires fulltime care. They do have help for his care so she can have time to take care of herself. She has a daughter and two grandkids that live in [place] as her support. She is ready to get started with the program in hope that she will gain more strentgh and stamina to be able not to tire out every afternoon.    Expected Outcomes STG Kennyth Lose will attend all scheduled sessions, she will have time to work on her health and stamina. LTG Kennyth Lose will be able to continue to care for herself maintaining the program she started here at her home    Continue Psychosocial Services  Follow up required by staff             Psychosocial Re-Evaluation:  Psychosocial Re-Evaluation     [place]kins Name 11/16/21 1543 12/12/21 0957 12/19/21 1008 01/16/22 1021 02/06/22 1023     Psychosocial Re-Evaluation   Current issues with Current Stress Concerns;Current Sleep Concerns Current Stress Concerns;Current Sleep Concerns Current Stress Concerns;Current Depression;Current Anxiety/Panic Current Stress Concerns;Current Depression;Current Anxiety/Panic Current Stress Concerns;Current Depression;Current Anxiety/Panic   Comments Patient states that she  does have some concerns with stress related to being her husband's caregiver. She also states that she has had some trouble falling asleep and  staying asleep at night. She states that reading is her avenue for stress relief. She reports that rehab has been good for her because it helps her to feel better. Gizel continues to care for her husband as a primary caregiver.  She has a Actuary for classes.  Her doctor has changed her meds and she will pick them up soon.  She is still getting 3-4 hrs of sleep.  She seems to wake every hour and then sometimes can't get to sleep.  When she tries to exercise at home, her husband seems to always need something.  She feels that exercise has been helping her sleep as she is able to calm down easier in the evenings she exercises. Kalese repeated her PHQ today. Her score improved by 3 pts.  She is still struggling with sleep and fatigue on top of caring for her husband and dealing with her personal health too.  She continues to try to stay positive Yaslyn is doing well in rehab.  She is still stuggling sleep and fatigue still.  Her husband needs constat care and she is emotional drained.  Her PHQ is back up to an 8 again. She has good days and bad days.  The lack of sleep is her biggest contributor to how she is feeling. Eyonna is nearing graduation.  She continues to stuggle with sleep and her husband.  She needs more help, but they can't afford it.  She will do her PHQ as part of grad paperwork.  She tries to stay positve and looks for the good.   Expected Outcomes Short: Continue to read and attend exercise for stress relief. Log: Maintain a positive outlook. Short; Continue to exercise for mental boost Long: conitnue to use sitter to give her some self care time Short; Continue to exercise for mental boost Long: conitnue to use sitter to give her some self care time Short: Make time to exercise for mental boost Long: Conitnue to make time for self care Continue to make time for self care.   Interventions Encouraged to attend Cardiac Rehabilitation for the exercise Encouraged to attend Cardiac Rehabilitation for the  exercise;Stress management education Encouraged to attend Cardiac Rehabilitation for the exercise;Stress management education Encouraged to attend Cardiac Rehabilitation for the exercise;Stress management education Encouraged to attend Cardiac Rehabilitation for the exercise;Stress management education   Continue Psychosocial Services  Follow up required by staff -- Follow up required by staff -- Follow up required by staff     Initial Review   Source of Stress Concerns Family -- -- -- --   Comments Stress with husband needing total care.  Does have helpers every day so she can do what she needs to do. -- -- -- --            Psychosocial Discharge (Final Psychosocial Re-Evaluation):  Psychosocial Re-Evaluation - 02/06/22 1023       Psychosocial Re-Evaluation   Current issues with Current Stress Concerns;Current Depression;Current Anxiety/Panic    Comments Yoshiko is nearing graduation.  She continues to stuggle with sleep and her husband.  She needs more help, but they can't afford it.  She will do her PHQ as part of grad paperwork.  She tries to stay positve and looks for the good.    Expected Outcomes Continue to make time for self care.    Interventions Encouraged  to attend Cardiac Rehabilitation for the exercise;Stress management education    Continue Psychosocial Services  Follow up required by staff             Vocational Rehabilitation: Provide vocational rehab assistance to qualifying candidates.   Vocational Rehab Evaluation & Intervention:   Education: Education Goals: Education classes will be provided on a variety of topics geared toward better understanding of heart health and risk factor modification. Participant will state understanding/return demonstration of topics presented as noted by education test scores.  Learning Barriers/Preferences:   General Cardiac Education Topics:  AED/CPR: - Group verbal and written instruction with the use of models to  demonstrate the basic use of the AED with the basic ABC's of resuscitation.   Anatomy and Cardiac Procedures: - Group verbal and visual presentation and models provide information about basic cardiac anatomy and function. Reviews the testing methods done to diagnose heart disease and the outcomes of the test results. Describes the treatment choices: Medical Management, Angioplasty, or Coronary Bypass Surgery for treating various heart conditions including Myocardial Infarction, Angina, Valve Disease, and Cardiac Arrhythmias.  Written material given at graduation.   Medication Safety: - Group verbal and visual instruction to review commonly prescribed medications for heart and lung disease. Reviews the medication, class of the drug, and side effects. Includes the steps to properly store meds and maintain the prescription regimen.  Written material given at graduation.   Intimacy: - Group verbal instruction through game format to discuss how heart and lung disease can affect sexual intimacy. Written material given at graduation..   Know Your Numbers and Heart Failure: - Group verbal and visual instruction to discuss disease risk factors for cardiac and pulmonary disease and treatment options.  Reviews associated critical values for Overweight/Obesity, Hypertension, Cholesterol, and Diabetes.  Discusses basics of heart failure: signs/symptoms and treatments.  Introduces Heart Failure Zone chart for action plan for heart failure.  Written material given at graduation.   Infection Prevention: - Provides verbal and written material to individual with discussion of infection control including proper hand washing and proper equipment cleaning during exercise session. Flowsheet Row Cardiac Rehab from 10/17/2021 in [facility] Cardiac and Pulmonary Rehab  Date 10/17/21  Educator [facility]  Instruction Review Code 1- Verbalizes Understanding       Falls Prevention: - Provides verbal and written material to  individual with discussion of falls prevention and safety. Flowsheet Row Cardiac Rehab from 10/17/2021 in [facility] Cardiac and Pulmonary Rehab  Date 10/17/21  Educator [facility]  Instruction Review Code 1- Verbalizes Understanding       Other: -Provides group and verbal instruction on various topics (see comments)   Knowledge Questionnaire Score:  Knowledge Questionnaire Score - 02/22/22 1550       Knowledge Questionnaire Score   Post Score 23/26             Core Components/Risk Factors/Patient Goals at Admission:  Personal Goals and Risk Factors at Admission - 10/17/21 1208       Core Components/Risk Factors/Patient Goals on Admission    Weight Management Yes    Intervention Weight Management: Develop a combined nutrition and exercise program designed to reach desired caloric intake, while maintaining appropriate intake of nutrient and fiber, sodium and fats, and appropriate energy expenditure required for the weight goal.;Weight Management: Provide education and appropriate resources to help participant work on and attain dietary goals.    Admit Weight 156 lb 6.4 oz (70.9 kg)    Goal Weight: Short Term 150 lb (  68 kg)    Goal Weight: Long Term 150 lb (68 kg)    Expected Outcomes Short Term: Continue to assess and modify interventions until short term weight is achieved;Long Term: Adherence to nutrition and physical activity/exercise program aimed toward attainment of established weight goal;Weight Maintenance: Understanding of the daily nutrition guidelines, which includes 25-35% calories from fat, 7% or less cal from saturated fats, less than 29m cholesterol, less than 1.5gm of sodium, & 5 or more servings of fruits and vegetables daily;Understanding recommendations for meals to include 15-35% energy as protein, 25-35% energy from fat, 35-60% energy from carbohydrates, less than 2083mof dietary cholesterol, 20-35 gm of total fiber daily;Understanding of distribution of calorie intake  throughout the day with the consumption of 4-5 meals/snacks    Heart Failure Yes    Intervention Provide a combined exercise and nutrition program that is supplemented with education, support and counseling about heart failure. Directed toward relieving symptoms such as shortness of breath, decreased exercise tolerance, and extremity edema.    Expected Outcomes Improve functional capacity of life;Short term: Attendance in program 2-3 days a week with increased exercise capacity. Reported lower sodium intake. Reported increased fruit and vegetable intake. Reports medication compliance.;Short term: Daily weights obtained and reported for increase. Utilizing diuretic protocols set by physician.;Long term: Adoption of self-care skills and reduction of barriers for early signs and symptoms recognition and intervention leading to self-care maintenance.    Lipids Yes    Intervention Provide education and support for participant on nutrition & aerobic/resistive exercise along with prescribed medications to achieve LDL <7021mHDL >71m23m  Expected Outcomes Short Term: Participant states understanding of desired cholesterol values and is compliant with medications prescribed. Participant is following exercise prescription and nutrition guidelines.;Long Term: Cholesterol controlled with medications as prescribed, with individualized exercise RX and with personalized nutrition plan. Value goals: LDL < 70mg8mL > 40 mg.             Education:Diabetes - Individual verbal and written instruction to review signs/symptoms of diabetes, desired ranges of glucose level fasting, after meals and with exercise. Acknowledge that pre and post exercise glucose checks will be done for 3 sessions at entry of program.   Core Components/Risk Factors/Patient Goals Review:   Goals and Risk Factor Review     Row Name 11/16/21 1549 12/12/21 1002 01/16/22 1026 02/06/22 1027       Core Components/Risk Factors/Patient Goals  Review   Personal Goals Review Hypertension;Lipids;Weight Management/Obesity Hypertension;Lipids;Weight Management/Obesity Hypertension;Lipids;Weight Management/Obesity Hypertension;Weight Management/Obesity    Review Patient does have a BP cuff at home, but has not been checking her BP at home on a regular basis. She was informed that this would be a good way to make sure she is within her normal range. She is happy with her current weight but wouldn't mind losing a few pounds. We spoke about how exercising more regularly at home in addition to rehab could help with weight loss. JacquRoberttaoing well in rehab. Her weight is holding steady (up 2-3 down 2-3 lb).  She is feeling pretty good overall.  Her pressures are doing well in class, but she is not checking them at home.  She just had a med change, but has not picked it up yet.  She is going to start a new BP med today and doctor warned her about feeling lightheaded with it.  She will bring it in on Thursday. JacquVelaoing well in rehab.  Her weight is steady.  She would like to lose weight.  Her pressures continue to do well in class.  She is doing well with her new BP med and not noticed any side effects from it.  She does check it at home on [place]. Makinzey is nearing graduation.  Her weight is steady.  Her pressures have continued to do well.  She continues to keep eye on her risk factors.    Expected Outcomes Short: begin checking BP at home. Long: continue to attend rehab and exercise at home for weight loss. Short; Start new med Long; continue to montior risk factors. Short: Continue to work on weight loss Long: Continue to monitor risk factors Continue to monitor risk factors.             Core Components/Risk Factors/Patient Goals at Discharge (Final Review):   Goals and Risk Factor Review - 02/06/22 1027       Core Components/Risk Factors/Patient Goals Review   Personal Goals Review Hypertension;Weight Management/Obesity    Review  Archita is nearing graduation.  Her weight is steady.  Her pressures have continued to do well.  She continues to keep eye on her risk factors.    Expected Outcomes Continue to monitor risk factors.             ITP Comments:  ITP Comments     Row Name 10/06/21 1331 10/17/21 1159 10/24/21 0956 11/08/21 0955 11/29/21 1148   ITP Comments Virtual orientation call completed today. shehas an appointment on Date: 10/17/2021  for EP eval and gym Orientation.  Documentation of diagnosis can be found in [facility] Date: 07/17/2021 . Completed 6MWT and gym orientation. Initial ITP created and sent for review to Dr. Emily Filbert, Medical Director. First full day of exercise!  Patient was oriented to gym and equipment including functions, settings, policies, and procedures.  Patient's individual exercise prescription and treatment plan were reviewed.  All starting workloads were established based on the results of the 6 minute walk test done at initial orientation visit.  The plan for exercise progression was also introduced and progression will be customized based on patient's performance and goals. 30 Day review completed. Medical Director ITP review done, changes made as directed, and signed approval by Medical Director. Completed initial RD consultation    Row Name 12/06/21 0755 01/03/22 0754 01/31/22 0740 02/28/22 0953 03/01/22 1541   ITP Comments 30 Day review completed. Medical Director ITP review done, changes made as directed, and signed approval by Medical Director. 30 Day review completed. Medical Director ITP review done, changes made as directed, and signed approva30 Day review completed. Medical Director ITP review done, changes made as directed, and signed approval by Medical Director.l by Medical Director. 30 Day review completed. Medical Director ITP review done, changes made as directed, and signed approval by Medical Director. 30 Day review completed. Medical Director ITP review done, changes made as  directed, and signed approval by Medical Director. Verneal graduated today from  rehab with 36 sessions completed.  Details of the patient's exercise prescription and what He needs to do in order to continue the prescription and progress were discussed with patient.  Patient was given a copy of prescription and goals.  Patient verbalized understanding.  Hadja plans to continue to exercise by walking.            Comments: Discharge ITP

## 2022-03-01 NOTE — Telephone Encounter (Signed)
Patient took home application for [facility] patient assitance to fill out and will bring it back with proof of income for [place] to fax to company next week.   Madell Heino, NT

## 2022-03-09 ENCOUNTER — Telehealth: Payer: Self-pay | Admitting: Family

## 2022-03-09 NOTE — Telephone Encounter (Signed)
Notified patient that she was approved for [employer] patient assistance for Entresto until 04/30/2023 and instructions on how to call to set up mail order delivery.   Veronica Romero, NT

## 2022-03-25 ENCOUNTER — Other Ambulatory Visit (HOSPITAL_COMMUNITY): Payer: Self-pay

## 2022-03-26 ENCOUNTER — Other Ambulatory Visit (HOSPITAL_COMMUNITY): Payer: Self-pay

## 2022-03-27 DIAGNOSIS — M05742 Rheumatoid arthritis with rheumatoid factor of left hand without organ or systems involvement: Principal | ICD-10-CM

## 2022-03-27 DIAGNOSIS — M05741 Rheumatoid arthritis with rheumatoid factor of right hand without organ or systems involvement: Principal | ICD-10-CM

## 2022-03-27 DIAGNOSIS — M059 Rheumatoid arthritis with rheumatoid factor, unspecified: Principal | ICD-10-CM

## 2022-03-27 DIAGNOSIS — M0579 Rheumatoid arthritis with rheumatoid factor of multiple sites without organ or systems involvement: Principal | ICD-10-CM

## 2022-03-27 DIAGNOSIS — M8589 Other specified disorders of bone density and structure, multiple sites: Principal | ICD-10-CM

## 2022-03-27 MED ORDER — METHOTREXATE SODIUM (CONTAINS PRESERVATIVES) 25 MG/ML INJECTION SOLUTION
SUBCUTANEOUS | 0 refills | 168 days | Status: CP
Start: 2022-03-27 — End: ?

## 2022-03-29 DIAGNOSIS — M05741 Rheumatoid arthritis with rheumatoid factor of right hand without organ or systems involvement: Principal | ICD-10-CM

## 2022-03-29 DIAGNOSIS — M05722 Rheumatoid arthritis with rheumatoid factor of left elbow without organ or systems involvement: Principal | ICD-10-CM

## 2022-03-29 DIAGNOSIS — M05742 Rheumatoid arthritis with rheumatoid factor of left hand without organ or systems involvement: Principal | ICD-10-CM

## 2022-03-29 DIAGNOSIS — M05721 Rheumatoid arthritis with rheumatoid factor of right elbow without organ or systems involvement: Principal | ICD-10-CM

## 2022-03-29 MED ORDER — ADALIMUMAB PEN CITRATE FREE 40 MG/0.4 ML
SUBCUTANEOUS | 3 refills | 84 days | Status: CP
Start: 2022-03-29 — End: ?

## 2022-03-30 DIAGNOSIS — M05721 Rheumatoid arthritis with rheumatoid factor of right elbow without organ or systems involvement: Principal | ICD-10-CM

## 2022-03-30 DIAGNOSIS — M05722 Rheumatoid arthritis with rheumatoid factor of left elbow without organ or systems involvement: Principal | ICD-10-CM

## 2022-04-10 ENCOUNTER — Ambulatory Visit: Payer: Medicare Other | Attending: Cardiology

## 2022-04-10 DIAGNOSIS — I420 Dilated cardiomyopathy: Secondary | ICD-10-CM

## 2022-04-10 DIAGNOSIS — I2510 Atherosclerotic heart disease of native coronary artery without angina pectoris: Secondary | ICD-10-CM | POA: Insufficient documentation

## 2022-04-10 DIAGNOSIS — I252 Old myocardial infarction: Secondary | ICD-10-CM | POA: Insufficient documentation

## 2022-04-10 DIAGNOSIS — I219 Acute myocardial infarction, unspecified: Secondary | ICD-10-CM | POA: Insufficient documentation

## 2022-04-10 DIAGNOSIS — I272 Pulmonary hypertension, unspecified: Secondary | ICD-10-CM | POA: Insufficient documentation

## 2022-04-10 DIAGNOSIS — I080 Rheumatic disorders of both mitral and aortic valves: Secondary | ICD-10-CM | POA: Insufficient documentation

## 2022-04-10 DIAGNOSIS — E785 Hyperlipidemia, unspecified: Secondary | ICD-10-CM | POA: Diagnosis not present

## 2022-04-10 DIAGNOSIS — I509 Heart failure, unspecified: Secondary | ICD-10-CM | POA: Diagnosis not present

## 2022-04-10 LAB — ECHOCARDIOGRAM COMPLETE
AR max vel: 1.89 cm2
AV Area VTI: 1.94 cm2
AV Area mean vel: 2.08 cm2
AV Mean grad: 3 mmHg
AV Peak grad: 6.8 mmHg
AV Vena cont: 0.3 cm
Ao pk vel: 1.3 m/s
Area-P 1/2: 3.27 cm2
Calc EF: 50.7 %
P 1/2 time: 238 msec
S' Lateral: 3.8 cm
Single Plane A2C EF: 48.4 %
Single Plane A4C EF: 51.3 %

## 2022-04-11 ENCOUNTER — Other Ambulatory Visit: Payer: Self-pay

## 2022-04-11 ENCOUNTER — Other Ambulatory Visit: Payer: Self-pay | Admitting: Cardiology

## 2022-04-11 MED ORDER — CLOPIDOGREL BISULFATE 75 MG PO TABS
75.0000 mg | ORAL_TABLET | Freq: Every day | ORAL | 3 refills | Status: DC
  Filled 2022-04-11: qty 90, 90d supply, fill #0
  Filled 2022-07-20: qty 90, 90d supply, fill #1

## 2022-04-12 ENCOUNTER — Other Ambulatory Visit: Payer: Self-pay

## 2022-04-17 ENCOUNTER — Ambulatory Visit: Payer: PRIVATE HEALTH INSURANCE | Admitting: Cardiology

## 2022-05-02 ENCOUNTER — Ambulatory Visit: Payer: PRIVATE HEALTH INSURANCE | Admitting: Family

## 2022-05-09 ENCOUNTER — Other Ambulatory Visit (HOSPITAL_COMMUNITY): Payer: Self-pay

## 2022-05-16 ENCOUNTER — Ambulatory Visit (HOSPITAL_BASED_OUTPATIENT_CLINIC_OR_DEPARTMENT_OTHER): Payer: Medicare Other | Admitting: Family

## 2022-05-16 ENCOUNTER — Other Ambulatory Visit
Admission: RE | Admit: 2022-05-16 | Discharge: 2022-05-16 | Disposition: A | Discharge status: Home / Self Care | Payer: Medicare Other | Source: Ambulatory Visit | Attending: Family | Admitting: Family

## 2022-05-16 ENCOUNTER — Encounter: Payer: Self-pay | Admitting: Family

## 2022-05-16 VITALS — BP 125/58 | HR 80 | Resp 20 | Wt 172.0 lb

## 2022-05-16 DIAGNOSIS — M069 Rheumatoid arthritis, unspecified: Secondary | ICD-10-CM | POA: Insufficient documentation

## 2022-05-16 DIAGNOSIS — J849 Interstitial pulmonary disease, unspecified: Secondary | ICD-10-CM | POA: Diagnosis not present

## 2022-05-16 DIAGNOSIS — I2510 Atherosclerotic heart disease of native coronary artery without angina pectoris: Secondary | ICD-10-CM | POA: Insufficient documentation

## 2022-05-16 DIAGNOSIS — J8410 Pulmonary fibrosis, unspecified: Secondary | ICD-10-CM | POA: Insufficient documentation

## 2022-05-16 DIAGNOSIS — E119 Type 2 diabetes mellitus without complications: Secondary | ICD-10-CM | POA: Insufficient documentation

## 2022-05-16 DIAGNOSIS — G479 Sleep disorder, unspecified: Secondary | ICD-10-CM | POA: Insufficient documentation

## 2022-05-16 DIAGNOSIS — Z7902 Long term (current) use of antithrombotics/antiplatelets: Secondary | ICD-10-CM | POA: Diagnosis not present

## 2022-05-16 DIAGNOSIS — D649 Anemia, unspecified: Secondary | ICD-10-CM | POA: Diagnosis not present

## 2022-05-16 DIAGNOSIS — I2722 Pulmonary hypertension due to left heart disease: Secondary | ICD-10-CM | POA: Insufficient documentation

## 2022-05-16 DIAGNOSIS — Z7984 Long term (current) use of oral hypoglycemic drugs: Secondary | ICD-10-CM | POA: Diagnosis not present

## 2022-05-16 DIAGNOSIS — G8929 Other chronic pain: Secondary | ICD-10-CM | POA: Insufficient documentation

## 2022-05-16 DIAGNOSIS — I10 Essential (primary) hypertension: Secondary | ICD-10-CM | POA: Diagnosis not present

## 2022-05-16 DIAGNOSIS — I5020 Unspecified systolic (congestive) heart failure: Secondary | ICD-10-CM | POA: Insufficient documentation

## 2022-05-16 DIAGNOSIS — R079 Chest pain, unspecified: Secondary | ICD-10-CM | POA: Diagnosis not present

## 2022-05-16 DIAGNOSIS — Z79899 Other long term (current) drug therapy: Secondary | ICD-10-CM | POA: Diagnosis not present

## 2022-05-16 DIAGNOSIS — Z634 Disappearance and death of family member: Secondary | ICD-10-CM | POA: Insufficient documentation

## 2022-05-16 DIAGNOSIS — I509 Heart failure, unspecified: Secondary | ICD-10-CM | POA: Insufficient documentation

## 2022-05-16 DIAGNOSIS — I110 Hypertensive heart disease with heart failure: Secondary | ICD-10-CM | POA: Insufficient documentation

## 2022-05-16 DIAGNOSIS — F419 Anxiety disorder, unspecified: Secondary | ICD-10-CM | POA: Insufficient documentation

## 2022-05-16 DIAGNOSIS — R0602 Shortness of breath: Secondary | ICD-10-CM | POA: Insufficient documentation

## 2022-05-16 DIAGNOSIS — Z955 Presence of coronary angioplasty implant and graft: Secondary | ICD-10-CM | POA: Insufficient documentation

## 2022-05-16 LAB — BASIC METABOLIC PANEL
Anion gap: 11 (ref 5–15)
BUN: 25 mg/dL — ABNORMAL HIGH (ref 8–23)
CO2: 26 mmol/L (ref 22–32)
Calcium: 9 mg/dL (ref 8.9–10.3)
Chloride: 103 mmol/L (ref 98–111)
Creatinine, Ser: 0.81 mg/dL (ref 0.44–1.00)
GFR, Estimated: >60 mL/min (ref >60)
Glucose, Bld: 88 mg/dL (ref 70–99)
Potassium: 3.6 mmol/L (ref 3.5–5.1)
Sodium: 140 mmol/L (ref 135–145)

## 2022-05-16 NOTE — Progress Notes (Signed)
Patient ID: Veronica Romero, female    DOB: Dec 25, 1950, 72 y.o.   MRN: [id number]  HPI  [name] is a 72 y/o female with a history of DM, HTN, ILD, anemia, RA , CAD with PCI to LAD and chronic heart failure.   Echo done 04/10/22 showed an EF of 45% along with mild LAE and mild MR. Echo report from 11/07/21 reviewed and showed an EF of 40-45% along with moderate LAE, moderate AR and mild aortic dilatation.   RHC/LHC done 06/16/21 and showed:   Ost LAD to Mid LAD lesion is 85% stenosed.   Prox RCA lesion is 50% stenosed.   LPAV lesion is 50% stenosed.   There is mild to moderate left ventricular systolic dysfunction.   LV end diastolic pressure is normal.   LV end diastolic pressure is normal.   The left ventricular ejection fraction is 35-45% by visual estimate.   Hemodynamic findings consistent with mild pulmonary hypertension.  CONCLUSIONS: Calcified segmental 85% proximal LAD Dominant circumflex without significant obstruction.  Distally before the PDA there is segmental 50% narrowing. Left main is widely patent Right coronary was poorly visualized, is nondominant, and contains mid 50% stenosis. Low normal to mild systolic dysfunction with EF in the 35 to 45% range.  LVEDP 14 mmHg. Mild pulmonary hypertension with mean PA pressure 22 mmHg; mean wedge pressure 14 mmHg; right atrial mean 6 mmHg.  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  Has not been admitted or been in the ED in the last 6 months.   She presents today for a follow-up visit with a chief complaint of minimal fatigue upon moderate exertion. Describes this as chronic in nature. Has associated shortness of breath, rare chest pain, chronic pain (due to RA), anxiety and difficulty sleeping along with this. She denies any abdominal distention, palpitations, pedal edema, cough or dizziness.   Her husband recently passed away on 05/13/2022 so she's been eating higher sodium foods recently such as pizza since she's been staying with  family or having family stay with her.   Drinks 1 red bull daily along with 2 drinks of either wine or vodka.   Past Medical History:  Diagnosis Date   Anemia    CHF (congestive heart failure) (HCC)    Coronary artery disease    Diabetes mellitus without complication (HCC)    Hypertension    ILD (interstitial lung disease) (HCC)    Ischemic cardiomyopathy 06/12/2021   EF 25-30%   Pulmonary HTN (HCC)    Rheumatoid arthritis (HCC)    Past Surgical History:  Procedure Laterality Date   BREAST BIOPSY Left    ?when//benign   CORONARY ANGIOPLASTY     CORONARY ATHERECTOMY N/A 07/17/2021   Procedure: CORONARY ATHERECTOMY;  Surgeon: [place], Peter M, MD;  Location: [facility] INVASIVE CV LAB;  Service: Cardiovascular;  Laterality: N/A;   RIGHT/LEFT HEART CATH AND CORONARY ANGIOGRAPHY N/A 06/16/2021   Procedure: RIGHT/LEFT HEART CATH AND CORONARY ANGIOGRAPHY;  Surgeon: Lyn Records, MD;  Location: [facility] INVASIVE CV LAB;  Service: Cardiovascular;  Laterality: N/A;   Family History  Problem Relation Age of Onset   Heart disease Father    Kidney disease Brother    Autoimmune disease Daughter    Social History   Tobacco Use   Smoking status: Never   Smokeless tobacco: Never  Substance Use Topics   Alcohol use: Yes    Alcohol/week: 2.0 standard drinks of alcohol    Types: 2 Glasses of wine per week  Allergies  Allergen Reactions   Penicillins Rash   Prior to Admission medications   Medication Sig Start Date End Date Taking? Authorizing Provider  Adalimumab (HUMIRA PEN) 40 MG/0.4ML PNKT Inject 0.4 mLs into the skin every Tuesday. Tuesday 04/10/21  Yes [provider]  aspirin EC 81 MG tablet Take 1 tablet (81 mg total) by mouth daily. Swallow whole. 07/18/21  Yes Cheryln Manly, NP  carvedilol (COREG) 3.125 MG tablet Take 1 tablet (3.125 mg total) by mouth 2 (two) times daily with a meal. 08/14/21  Yes Tobb, Kardie, DO  Cholecalciferol (VITAMIN D3 PO) Take 1 tablet by mouth  daily.   Yes [provider]  clopidogrel (PLAVIX) 75 MG tablet Take 1 tablet (75 mg total) by mouth daily. 04/11/22  Yes Tobb, Kardie, DO  dapagliflozin propanediol (FARXIGA) 10 MG TABS tablet Take 1 tablet (10 mg total) by mouth daily before breakfast. 01/29/22  Yes Evertt Chouinard A, FNP  furosemide (LASIX) 40 MG tablet Take 20 mg by mouth daily. Take 1/2 tablet daily.   Yes [provider]  leucovorin (WELLCOVORIN) 5 MG tablet Take 15 mg by mouth every Saturday. 04/07/21  Yes [provider]  methotrexate 50 MG/2ML injection Inject 1 mL (25 mg total) into the muscle every Saturday. Hold methotrexate for 2 more weeks and then resume 3/10 07/22/21  Yes Cheryln Manly, NP  Multiple Vitamin (MULTIVITAMIN WITH MINERALS) TABS tablet Take 1 tablet by mouth daily.   Yes [provider]  Polyethyl Glycol-Propyl Glycol (LUBRICANT EYE DROPS) 0.4-0.3 % SOLN Place 1 drop into both eyes 3 (three) times daily as needed (dry/irritated eyes.).   Yes [provider]  predniSONE (DELTASONE) 5 MG tablet Take 10 mg by mouth in the morning. 04/12/21  Yes [provider]  rosuvastatin (CRESTOR) 40 MG tablet Take 1 tablet (40 mg total) by mouth daily. 01/15/22  Yes Tobb, Kardie, DO  sacubitril-valsartan (ENTRESTO) 24-26 MG Take 1 tablet by mouth 2 (two) times daily. 03/01/22  Yes Terri Malerba, Otila Kluver A, FNP  spironolactone (ALDACTONE) 25 MG tablet Take 1/2 tablet (12.5 mg total) by mouth daily. Patient taking differently: Take 25 mg by mouth daily. 02/05/22  Yes Jerline Pain, MD  traMADol (ULTRAM) 50 MG tablet Take 50 mg by mouth every 6 (six) hours as needed (pain.).   Yes [provider]  nitroGLYCERIN (NITROSTAT) 0.4 MG SL tablet Place 1 tablet (0.4 mg total) under the tongue every 5 (five) minutes as needed. Patient not taking: Reported on 05/16/2022 07/18/21   Cheryln Manly, NP   Review of Systems  Constitutional:  Positive for fatigue. Negative for  appetite change.  HENT:  Negative for congestion, postnasal drip and sore throat.   Eyes: Negative.   Respiratory:  Positive for shortness of breath (worsens with anxiety). Negative for cough and chest tightness.   Cardiovascular:  Positive for chest pain (rarely). Negative for palpitations and leg swelling.  Gastrointestinal:  Negative for abdominal distention.  Endocrine: Negative.   Genitourinary: Negative.   Musculoskeletal:  Positive for arthralgias (foot pain). Negative for back pain.  Skin: Negative.   Allergic/Immunologic: Negative.   Neurological:  Negative for dizziness and light-headedness.  Hematological:  Negative for adenopathy. Does not bruise/bleed easily.  Psychiatric/Behavioral:  Positive for sleep disturbance (chronic trouble sleeping). Negative for dysphoric mood. The patient is nervous/anxious.    Vitals:   05/16/22 1552  BP: (!) 125/58  Pulse: 80  Resp: 20  SpO2: 100%  Weight: 172 lb (78  kg)   Wt Readings from Last 3 Encounters:  05/16/22 172 lb (78 kg)  03/01/22 164 lb 6 oz (74.6 kg)  02/06/22 165 lb 1.6 oz (74.9 kg)   Lab Results  Component Value Date   CREATININE 0.74 01/17/2022   CREATININE 0.89 09/08/2021   CREATININE 0.94 07/18/2021   Physical Exam Vitals and nursing note reviewed.  Constitutional:      Appearance: Normal appearance.  HENT:     Head: Normocephalic and atraumatic.  Cardiovascular:     Rate and Rhythm: Normal rate and regular rhythm.  Pulmonary:     Effort: Pulmonary effort is normal. No respiratory distress.     Breath sounds: No wheezing or rales.  Abdominal:     General: There is no distension.     Palpations: Abdomen is soft.     Tenderness: There is no abdominal tenderness.  Musculoskeletal:        General: Deformity (both hands with severe RA in fingers) present. No tenderness.     Cervical back: Normal range of motion and neck supple.     Right lower leg: No edema.     Left lower leg: No edema.  Skin:    General:  Skin is warm and dry.  Neurological:     General: No focal deficit present.     Mental Status: She is alert and oriented to person, place, and time.  Psychiatric:        Mood and Affect: Mood is anxious.        Behavior: Behavior normal.        Thought Content: Thought content normal.   Assessment & Plan:  1: Chronic heart failure with mildly reduced ejection fraction- - NYHA class II - euvolemic today - weighing daily; reminded to call for an overnight weight gain of > 2 pounds or a weekly weight gain of > 5 pounds - weight up 8 pounds from last visit 2 months ago - occasionally adds salt when cooking but doesn't add it afterwards; has eaten higher sodium foods due to recent passing of her husband and other people providing food for her - saw cardiology (Tobb) 12/11/21; she would like to postpone her next appt there since she was seen here today; advised her to call that office and see if they will push it back a month - on GDMT of carvedilol, entresto, farxiga and spironolactone - check BMP today as labs weren't done back in December - taking furosemide 20mg  daily with additional 20mg  if needed - encouraged her to not drink the red bull or the vodka; she says that she uses the red bull to wake herself up and the alcohol to relax herself - BNP 06/21/21 was 134.5 - did receive her flu vaccine for this season  2: HTN- - BP 125/58 - saw PCP Mathis Dad) 01/17/22 - BMP 01/17/22 reviewed and showed sodium 140, potassium 4.4, creatinine 0.74 and GFR 86  3: DM- - A1c 06/12/21 was 5.4%  4: 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 Each medication was verbally reviewed with the patient and she was encouraged to bring the bottles to every visit to confirm accuracy of list.  Return in 3 months, sooner if needed.

## 2022-05-16 NOTE — Patient Instructions (Signed)
Continue weighing daily and call for an overnight weight gain of 3 pounds or more or a weekly weight gain of more than 5 pounds.   If you have voicemail, please make sure your mailbox is cleaned out so that we may leave a message and please make sure to listen to any voicemails.     

## 2022-05-23 ENCOUNTER — Ambulatory Visit: Admit: 2022-05-23 | Discharge: 2022-05-23 | Payer: MEDICARE | Attending: Rheumatology | Primary: Rheumatology

## 2022-05-23 DIAGNOSIS — M8589 Other specified disorders of bone density and structure, multiple sites: Principal | ICD-10-CM

## 2022-05-23 DIAGNOSIS — M05721 Rheumatoid arthritis with rheumatoid factor of right elbow without organ or systems involvement: Principal | ICD-10-CM

## 2022-05-23 DIAGNOSIS — M05722 Rheumatoid arthritis with rheumatoid factor of left elbow without organ or systems involvement: Principal | ICD-10-CM

## 2022-05-23 DIAGNOSIS — M0579 Rheumatoid arthritis with rheumatoid factor of multiple sites without organ or systems involvement: Principal | ICD-10-CM

## 2022-05-23 DIAGNOSIS — Z79899 Other long term (current) drug therapy: Principal | ICD-10-CM

## 2022-05-23 DIAGNOSIS — M05741 Rheumatoid arthritis with rheumatoid factor of right hand without organ or systems involvement: Principal | ICD-10-CM

## 2022-05-23 DIAGNOSIS — M05742 Rheumatoid arthritis with rheumatoid factor of left hand without organ or systems involvement: Principal | ICD-10-CM

## 2022-05-23 DIAGNOSIS — M059 Rheumatoid arthritis with rheumatoid factor, unspecified: Principal | ICD-10-CM

## 2022-05-23 MED ORDER — ADALIMUMAB PEN CITRATE FREE 40 MG/0.4 ML
SUBCUTANEOUS | 3 refills | 84 days | Status: CP
Start: 2022-05-23 — End: ?

## 2022-05-23 MED ORDER — METHOTREXATE SODIUM (CONTAINS PRESERVATIVES) 25 MG/ML INJECTION SOLUTION
SUBCUTANEOUS | 0 refills | 168 days | Status: CP
Start: 2022-05-23 — End: ?

## 2022-05-23 MED ORDER — PREDNISONE 5 MG TABLET
ORAL_TABLET | 6 refills | 0 days | Status: CP
Start: 2022-05-23 — End: ?

## 2022-05-23 MED ORDER — LEUCOVORIN CALCIUM 5 MG TABLET
ORAL_TABLET | ORAL | 6 refills | 84 days | Status: CP
Start: 2022-05-23 — End: ?

## 2022-05-23 MED ORDER — TRAMADOL 50 MG TABLET
ORAL_TABLET | Freq: Three times a day (TID) | ORAL | 0 refills | 30 days | Status: CP | PRN
Start: 2022-05-23 — End: ?

## 2022-05-24 ENCOUNTER — Other Ambulatory Visit: Payer: Self-pay

## 2022-05-24 ENCOUNTER — Ambulatory Visit: Payer: PRIVATE HEALTH INSURANCE | Admitting: Cardiology

## 2022-06-07 ENCOUNTER — Telehealth: Payer: Self-pay | Admitting: Family

## 2022-06-07 NOTE — Telephone Encounter (Signed)
Patient was approved for farxiga patient assitance till end of 2024 and medications have been shipped    [place], [place]

## 2022-06-21 ENCOUNTER — Other Ambulatory Visit: Payer: Self-pay

## 2022-06-21 ENCOUNTER — Other Ambulatory Visit (HOSPITAL_COMMUNITY): Payer: Self-pay

## 2022-06-21 ENCOUNTER — Other Ambulatory Visit: Payer: Self-pay | Admitting: Cardiology

## 2022-06-21 MED ORDER — SPIRONOLACTONE 25 MG PO TABS
12.5000 mg | ORAL_TABLET | Freq: Every day | ORAL | 3 refills | Status: DC
  Filled 2022-06-21 – 2022-07-28 (×3): qty 45, 90d supply, fill #0
  Filled 2022-09-24: qty 45, 90d supply, fill #1

## 2022-07-09 ENCOUNTER — Other Ambulatory Visit (HOSPITAL_COMMUNITY): Payer: Self-pay

## 2022-07-09 ENCOUNTER — Other Ambulatory Visit: Payer: Self-pay

## 2022-07-13 ENCOUNTER — Other Ambulatory Visit: Payer: Self-pay | Admitting: Cardiology

## 2022-07-17 NOTE — Progress Notes (Unsigned)
There were no vitals taken for this visit.   Subjective:    Patient ID: Veronica Romero, female    DOB: 04-03-1951, 72 y.o.   MRN: [id number]  HPI: Veronica Romero is a 72 y.o. female  No chief complaint on file.  HYPERTENSION / HYPERLIPIDEMIA/HEART FAILURE Patient states she is doing well.  Has been in Cardiac rehab.  A little nervous about being discharged. Satisfied with current treatment? yes Duration of hypertension: years BP monitoring frequency: not checking BP range:  BP medication side effects: no Past BP meds:  Lasix,  Carvedilol, Entresto and Losartan Duration of hyperlipidemia: years Cholesterol medication side effects: no Cholesterol supplements: none Past cholesterol medications: rosuvastatin (crestor) Medication compliance: excellent compliance Aspirin: no Recent stressors: no Recurrent headaches: no Visual changes: no Palpitations: no Dyspnea: no Chest pain: no Lower extremity edema: no Dizzy/lightheaded: no   ANEMIA Anemia status: controlled Etiology of anemia: Duration of anemia treatment:  Compliance with treatment:  no current medication regimen Iron supplementation side effects: no Severity of anemia: severe Fatigue: no Decreased exercise tolerance: no  Dyspnea on exertion: no Palpitations: no Bleeding: no Pica: no  RHEUMATOID Following up with Rheumatology.  Rheumatology is trying to wean down on her steroids.    ALCOHOL USE Patient is drinking 3-4 glasses of wine per day.   Relevant past medical, surgical, family and social history reviewed and updated as indicated. Interim medical history since our last visit reviewed. Allergies and medications reviewed and updated.  Review of Systems  Eyes:  Negative for visual disturbance.  Respiratory:  Negative for cough, chest tightness and shortness of breath.   Cardiovascular:  Negative for chest pain, palpitations and leg swelling.  Skin:        Painful callus  Neurological:  Negative  for dizziness and headaches.    Per HPI unless specifically indicated above     Objective:    There were no vitals taken for this visit.  Wt Readings from Last 3 Encounters:  05/16/22 172 lb (78 kg)  03/01/22 164 lb 6 oz (74.6 kg)  02/06/22 165 lb 1.6 oz (74.9 kg)    Physical Exam Vitals and nursing note reviewed.  Constitutional:      General: She is not in acute distress.    Appearance: Normal appearance. She is normal weight. She is not ill-appearing, toxic-appearing or diaphoretic.  HENT:     Head: Normocephalic.     Right Ear: External ear normal.     Left Ear: External ear normal.     Nose: Nose normal.     Mouth/Throat:     Mouth: Mucous membranes are moist.     Pharynx: Oropharynx is clear.  Eyes:     General:        Right eye: No discharge.        Left eye: No discharge.     Extraocular Movements: Extraocular movements intact.     Conjunctiva/sclera: Conjunctivae normal.     Pupils: Pupils are equal, round, and reactive to light.  Cardiovascular:     Rate and Rhythm: Normal rate and regular rhythm.     Heart sounds: No murmur heard. Pulmonary:     Effort: Pulmonary effort is normal. No respiratory distress.     Breath sounds: Normal breath sounds. No wheezing or rales.  Musculoskeletal:        General: Deformity (due the rheumatoid on hands and feet) present.     Cervical back: Normal range of motion and neck supple.  Feet:  Skin:    General: Skin is warm and dry.     Capillary Refill: Capillary refill takes less than 2 seconds.  Neurological:     General: No focal deficit present.     Mental Status: She is alert and oriented to person, place, and time. Mental status is at baseline.  Psychiatric:        Mood and Affect: Mood normal.        Behavior: Behavior normal.        Thought Content: Thought content normal.        Judgment: Judgment normal.     Results for orders placed or performed in visit on [id number]  Basic metabolic panel  Result  Value Ref Range   Sodium 140 135 - 145 mmol/L   Potassium 3.6 3.5 - 5.1 mmol/L   Chloride 103 98 - 111 mmol/L   CO2 26 22 - 32 mmol/L   Glucose, Bld 88 70 - 99 mg/dL   BUN 25 (H) 8 - 23 mg/dL   Creatinine, Ser 0.81 0.44 - 1.00 mg/dL   Calcium 9.0 8.9 - 10.3 mg/dL   GFR, Estimated >60 >60 mL/min   Anion gap 11 5 - 15      Assessment & Plan:   Problem List Items Addressed This Visit   None    Follow up plan: No follow-ups on file.

## 2022-07-18 ENCOUNTER — Ambulatory Visit (INDEPENDENT_AMBULATORY_CARE_PROVIDER_SITE_OTHER): Payer: Medicare Other | Admitting: Nurse Practitioner

## 2022-07-18 ENCOUNTER — Encounter: Payer: Self-pay | Admitting: Nurse Practitioner

## 2022-07-18 VITALS — BP 101/64 | HR 86 | Temp 97.9°F | Wt 168.7 lb

## 2022-07-18 DIAGNOSIS — I2510 Atherosclerotic heart disease of native coronary artery without angina pectoris: Secondary | ICD-10-CM | POA: Diagnosis not present

## 2022-07-18 DIAGNOSIS — I272 Pulmonary hypertension, unspecified: Secondary | ICD-10-CM

## 2022-07-18 DIAGNOSIS — I5020 Unspecified systolic (congestive) heart failure: Secondary | ICD-10-CM

## 2022-07-18 DIAGNOSIS — E782 Mixed hyperlipidemia: Secondary | ICD-10-CM

## 2022-07-18 DIAGNOSIS — M0579 Rheumatoid arthritis with rheumatoid factor of multiple sites without organ or systems involvement: Secondary | ICD-10-CM

## 2022-07-18 DIAGNOSIS — I700 Atherosclerosis of aorta: Secondary | ICD-10-CM

## 2022-07-18 NOTE — Assessment & Plan Note (Signed)
Observed on CT. Continue with ASA, Plavix and Crestor. Continue to follow up with Cardiology.

## 2022-07-18 NOTE — Assessment & Plan Note (Signed)
Chronic. Followed by Cardiology. Completed cardiac rehab.  Followed by HF clinic.  Continue to follow up with specialist.  Labs ordered today.  Follow up in 6 months.  Call sooner if concerns arise.

## 2022-07-18 NOTE — Assessment & Plan Note (Signed)
Chronic.  Controlled.  Continue with current medication regimen.  Labs ordered today.  Return to clinic in 6 months for reevaluation.  Call sooner if concerns arise.  ? ?

## 2022-07-18 NOTE — Assessment & Plan Note (Signed)
Chronic. Diagnosed when she was 34.  On Prednisone 10mg  daily, Methotrexate injection weekly, and Humira Injection weekly.  Followed by Rheumatology at [facility].  Rheumatology is working to decrease her daily prednisone, however she is still on 10mg  daily.  Continue to follow per their recommendations.

## 2022-07-18 NOTE — Assessment & Plan Note (Signed)
Labs ordered at visit today.  Will make recommendations based on lab results.   

## 2022-07-19 LAB — COMPREHENSIVE METABOLIC PANEL
ALT: 18 IU/L (ref 0–32)
AST: 32 IU/L (ref 0–40)
Albumin/Globulin Ratio: 1.3 (ref 1.2–2.2)
Albumin: 4.2 g/dL (ref 3.8–4.8)
Alkaline Phosphatase: 67 IU/L (ref 44–121)
BUN/Creatinine Ratio: 14 (ref 12–28)
BUN: 12 mg/dL (ref 8–27)
Bilirubin Total: 0.5 mg/dL (ref 0.0–1.2)
CO2: 21 mmol/L (ref 20–29)
Calcium: 9.7 mg/dL (ref 8.7–10.3)
Chloride: 107 mmol/L — ABNORMAL HIGH (ref 96–106)
Creatinine, Ser: 0.86 mg/dL (ref 0.57–1.00)
Globulin, Total: 3.2 g/dL (ref 1.5–4.5)
Glucose: 104 mg/dL — ABNORMAL HIGH (ref 70–99)
Potassium: 3.9 mmol/L (ref 3.5–5.2)
Sodium: 143 mmol/L (ref 134–144)
Total Protein: 7.4 g/dL (ref 6.0–8.5)
eGFR: 71 mL/min/{1.73_m2} (ref >59)

## 2022-07-19 LAB — LIPID PANEL
Chol/HDL Ratio: 2.1 ratio (ref 0.0–4.4)
Cholesterol, Total: 158 mg/dL (ref 100–199)
HDL: 75 mg/dL (ref >39)
LDL Chol Calc (NIH): 66 mg/dL (ref 0–99)
Triglycerides: 95 mg/dL (ref 0–149)
VLDL Cholesterol Cal: 17 mg/dL (ref 5–40)

## 2022-07-19 LAB — CBC WITH DIFFERENTIAL/PLATELET

## 2022-07-19 NOTE — Progress Notes (Signed)
Please let patient know that her lab work looks good.  Her complete blood count was cancelled due to there not being enough blood.  But she had one done in January so we don't need to repeat it right now.  Continue with current medication regimen.  Follow up as discussed.

## 2022-07-20 ENCOUNTER — Other Ambulatory Visit: Payer: Self-pay | Admitting: Cardiology

## 2022-07-20 ENCOUNTER — Other Ambulatory Visit (HOSPITAL_COMMUNITY): Payer: Self-pay

## 2022-07-23 ENCOUNTER — Other Ambulatory Visit: Payer: Self-pay

## 2022-07-23 ENCOUNTER — Telehealth: Payer: Self-pay

## 2022-07-23 MED ORDER — CARVEDILOL 3.125 MG PO TABS: 3.1250 mg | ORAL_TABLET | Freq: Two times a day (BID) | ORAL | 3 refills | Status: DC

## 2022-07-23 NOTE — Telephone Encounter (Signed)
Pt requested to send refill for carvedilol to [facility] [id number]. Med refilled and sent to preferred pharmacy per pt request.

## 2022-07-24 ENCOUNTER — Ambulatory Visit: Payer: PRIVATE HEALTH INSURANCE | Admitting: Cardiology

## 2022-07-28 ENCOUNTER — Other Ambulatory Visit (HOSPITAL_COMMUNITY): Payer: Self-pay

## 2022-08-15 ENCOUNTER — Ambulatory Visit: Payer: Medicare Other | Attending: Family | Admitting: Family

## 2022-08-15 ENCOUNTER — Encounter: Payer: Self-pay | Admitting: Family

## 2022-08-15 VITALS — BP 101/63 | HR 81 | Wt 167.1 lb

## 2022-08-15 DIAGNOSIS — Z955 Presence of coronary angioplasty implant and graft: Secondary | ICD-10-CM | POA: Insufficient documentation

## 2022-08-15 DIAGNOSIS — I509 Heart failure, unspecified: Secondary | ICD-10-CM | POA: Insufficient documentation

## 2022-08-15 DIAGNOSIS — I2722 Pulmonary hypertension due to left heart disease: Secondary | ICD-10-CM | POA: Diagnosis not present

## 2022-08-15 DIAGNOSIS — I110 Hypertensive heart disease with heart failure: Secondary | ICD-10-CM | POA: Diagnosis not present

## 2022-08-15 DIAGNOSIS — I10 Essential (primary) hypertension: Secondary | ICD-10-CM | POA: Diagnosis not present

## 2022-08-15 DIAGNOSIS — I5020 Unspecified systolic (congestive) heart failure: Secondary | ICD-10-CM

## 2022-08-15 DIAGNOSIS — J8410 Pulmonary fibrosis, unspecified: Secondary | ICD-10-CM

## 2022-08-15 DIAGNOSIS — I2510 Atherosclerotic heart disease of native coronary artery without angina pectoris: Secondary | ICD-10-CM | POA: Insufficient documentation

## 2022-08-15 DIAGNOSIS — E119 Type 2 diabetes mellitus without complications: Secondary | ICD-10-CM | POA: Diagnosis not present

## 2022-08-15 NOTE — Progress Notes (Signed)
Patient ID: Veronica Romero, female    DOB: 06/12/1950, 72 y.o.   MRN: [id number]  Primary cardiologist: Thomasene Ripple, DO (last seen 08/23; returns 05/24) PCP: Larae Grooms, NP (last seen 03/24)  HPI  Veronica Romero is a 72 y/o female with a history of DM, HTN, ILD, anemia, RA , CAD with PCI to LAD and chronic heart failure.   Echo 04/10/22: EF of 45% along with mild LAE and mild MR. Echo 11/07/21: EF of 40-45% along with moderate LAE, moderate AR and mild aortic dilatation.   RHC/LHC done 06/16/21:   Ost LAD to Mid LAD lesion is 85% stenosed.   Prox RCA lesion is 50% stenosed.   LPAV lesion is 50% stenosed.   There is mild to moderate left ventricular systolic dysfunction.   LV end diastolic pressure is normal.   LV end diastolic pressure is normal.   The left ventricular ejection fraction is 35-45% by visual estimate.   Hemodynamic findings consistent with mild pulmonary hypertension.  CONCLUSIONS: Calcified segmental 85% proximal LAD Dominant circumflex without significant obstruction.  Distally before the PDA there is segmental 50% narrowing. Left main is widely patent Right coronary was poorly visualized, is nondominant, and contains mid 50% stenosis. Low normal to mild systolic dysfunction with EF in the 35 to 45% range.  LVEDP 14 mmHg. Mild pulmonary hypertension with mean PA pressure 22 mmHg; mean wedge pressure 14 mmHg; right atrial mean 6 mmHg.  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  Has not been admitted or been in the ED in the last 6 months.   She presents today for a HF follow-up visit with a chief complaint of minimal fatigue with moderate exertion. Chronic in nature although has worsened due to her RA worsening. Has SOB, anxiety, chronic difficulty sleeping & right knee pain along with this. Denies dizziness, abdominal distention, palpitations, pedal edema, chest pain, cough or weight gain.   Does note swelling around her ankles if she sits for too long, usually resolves  with elevation.   Her husband passed away on 2022-05-05. Her daughter is talking with her about moving in together.   Past Medical History:  Diagnosis Date   Anemia    CHF (congestive heart failure) (HCC)    Coronary artery disease    Diabetes mellitus without complication (HCC)    Hypertension    ILD (interstitial lung disease) (HCC)    Ischemic cardiomyopathy 06/12/2021   EF 25-30%   Pulmonary HTN (HCC)    Rheumatoid arthritis (HCC)    Past Surgical History:  Procedure Laterality Date   BREAST BIOPSY Left    ?when//benign   CORONARY ANGIOPLASTY     CORONARY ATHERECTOMY N/A 07/17/2021   Procedure: CORONARY ATHERECTOMY;  Surgeon: [place], Peter M, MD;  Location: [facility] INVASIVE CV LAB;  Service: Cardiovascular;  Laterality: N/A;   RIGHT/LEFT HEART CATH AND CORONARY ANGIOGRAPHY N/A 06/16/2021   Procedure: RIGHT/LEFT HEART CATH AND CORONARY ANGIOGRAPHY;  Surgeon: Lyn Records, MD;  Location: [facility] INVASIVE CV LAB;  Service: Cardiovascular;  Laterality: N/A;   Family History  Problem Relation Age of Onset   Heart disease Father    Kidney disease Brother    Autoimmune disease Daughter    Social History   Tobacco Use   Smoking status: Never   Smokeless tobacco: Never  Substance Use Topics   Alcohol use: Yes    Alcohol/week: 2.0 standard drinks of alcohol    Types: 2 Glasses of wine per week   Allergies  Allergen  Reactions   Penicillins Rash   Prior to Admission medications   Medication Sig Start Date End Date Taking? Authorizing Provider  Adalimumab (HUMIRA PEN) 40 MG/0.4ML PNKT Inject 0.4 mLs into the skin every Tuesday. Tuesday 04/10/21  Yes [provider]  aspirin EC 81 MG tablet Take 1 tablet (81 mg total) by mouth daily. Swallow whole. 07/18/21  Yes Arty Baumgartner, NP  carvedilol (COREG) 3.125 MG tablet Take 1 tablet (3.125 mg total) by mouth 2 (two) times daily with a meal. 07/23/22  Yes Foy Mungia A, FNP  Cholecalciferol (VITAMIN D3 PO) Take 1 tablet by  mouth daily.   Yes [provider]  clopidogrel (PLAVIX) 75 MG tablet Take 1 tablet (75 mg total) by mouth daily. 04/11/22  Yes Tobb, Kardie, DO  dapagliflozin propanediol (FARXIGA) 10 MG TABS tablet Take 1 tablet (10 mg total) by mouth daily before breakfast. 01/29/22  Yes Ether Wolters A, FNP  furosemide (LASIX) 40 MG tablet Take 20 mg by mouth daily.   Yes [provider]  leucovorin (WELLCOVORIN) 5 MG tablet Take 15 mg by mouth every Saturday. 04/07/21  Yes [provider]  methotrexate 50 MG/2ML injection Inject 1 mL (25 mg total) into the muscle every Saturday. Hold methotrexate for 2 more weeks and then resume 3/10 07/22/21  Yes Arty Baumgartner, NP  Multiple Vitamin (MULTIVITAMIN WITH MINERALS) TABS tablet Take 1 tablet by mouth daily.   Yes [provider]  nitroGLYCERIN (NITROSTAT) 0.4 MG SL tablet Place 1 tablet (0.4 mg total) under the tongue every 5 (five) minutes as needed. 07/18/21  Yes Arty Baumgartner, NP  Polyethyl Glycol-Propyl Glycol (LUBRICANT EYE DROPS) 0.4-0.3 % SOLN Place 1 drop into both eyes 3 (three) times daily as needed (dry/irritated eyes.).   Yes [provider]  predniSONE (DELTASONE) 5 MG tablet Take 10 mg by mouth in the morning. 04/12/21  Yes [provider]  rosuvastatin (CRESTOR) 40 MG tablet Take 1 tablet (40 mg total) by mouth daily. 01/15/22  Yes Tobb, Kardie, DO  sacubitril-valsartan (ENTRESTO) 24-26 MG Take 1 tablet by mouth 2 (two) times daily. 03/01/22  Yes Clarisa Kindred A, FNP  spironolactone (ALDACTONE) 25 MG tablet Take 0.5 tablets (12.5 mg total) by mouth daily. 06/21/22  Yes Jake Bathe, MD  traMADol (ULTRAM) 50 MG tablet Take 50 mg by mouth every 6 (six) hours as needed (pain.).   Yes [provider]    Review of Systems  Constitutional:  Positive for fatigue (worsening). Negative for appetite change.  HENT:  Negative for congestion, postnasal drip and sore throat.   Eyes: Negative.    Respiratory:  Positive for shortness of breath (worsens with anxiety). Negative for cough and chest tightness.   Cardiovascular:  Negative for chest pain, palpitations and leg swelling.  Gastrointestinal:  Negative for abdominal distention and abdominal pain.  Endocrine: Negative.   Genitourinary: Negative.   Musculoskeletal:  Positive for arthralgias (right knee). Negative for back pain.  Skin: Negative.   Allergic/Immunologic: Negative.   Neurological:  Negative for dizziness and light-headedness.  Hematological:  Negative for adenopathy. Does not bruise/bleed easily.  Psychiatric/Behavioral:  Positive for sleep disturbance (chronic trouble sleeping). Negative for dysphoric mood. The patient is nervous/anxious.    Vitals:   08/15/22 1310  BP: 101/63  Pulse: 81  SpO2: 95%  Weight: 167 lb 2 oz (75.8 kg)   Wt Readings from Last 3 Encounters:  08/15/22 167 lb 2 oz (75.8 kg)  07/18/22 168 lb  11.2 oz (76.5 kg)  05/16/22 172 lb (78 kg)   Lab Results  Component Value Date   CREATININE 0.86 07/18/2022   CREATININE 0.81 05/16/2022   CREATININE 0.74 01/17/2022   Physical Exam Vitals and nursing note reviewed.  Constitutional:      Appearance: Normal appearance.  HENT:     Head: Normocephalic and atraumatic.  Cardiovascular:     Rate and Rhythm: Normal rate and regular rhythm.  Pulmonary:     Effort: Pulmonary effort is normal. No respiratory distress.     Breath sounds: No wheezing or rales.  Abdominal:     General: There is no distension.     Palpations: Abdomen is soft.     Tenderness: There is no abdominal tenderness.  Musculoskeletal:        General: Deformity (both hands with severe RA in fingers) present. No tenderness.     Cervical back: Normal range of motion and neck supple.     Right lower leg: No edema.     Left lower leg: No edema.  Skin:    General: Skin is warm and dry.  Neurological:     General: No focal deficit present.     Mental Status: She is alert  and oriented to person, place, and time.  Psychiatric:        Mood and Affect: Mood is anxious.        Behavior: Behavior normal.        Thought Content: Thought content normal.   Assessment & Plan:  1: Ischemia heart failure with mildly reduced ejection fraction- - NYHA class II - euvolemic today - weighing daily; reminded to call for an overnight weight gain of > 2 pounds or a weekly weight gain of > 5 pounds - weight down 5 pounds from last visit 3 months ago - Echo 04/10/22: EF of 45% along with mild LAE and mild MR. Echo 11/07/21: EF of 40-45% along with moderate LAE, moderate AR and mild aortic dilatation.  - RHC/LHC done 06/16/21:   Ost LAD to Mid LAD lesion is 85% stenosed.   Prox RCA lesion is 50% stenosed.   LPAV lesion is 50% stenosed.   There is mild to moderate left ventricular systolic dysfunction.   LV end diastolic pressure is normal.   LV end diastolic pressure is normal.   The left ventricular ejection fraction is 35-45% by visual estimate.   Hemodynamic findings consistent with mild pulmonary hypertension.  CONCLUSIONS: Calcified segmental 85% proximal LAD Dominant circumflex without significant obstruction. Distally before the PDA there is segmental 50% narrowing. Left main is widely patent Right coronary was poorly visualized, is nondominant, and contains mid 50% stenosis. Low normal to mild systolic dysfunction with EF in the 35 to 45% range.  LVEDP 14 mmHg. Mild pulmonary hypertension with mean PA pressure 22 mmHg; mean wedge pressure 14 mmHg; right atrial mean 6 mmHg.  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 Advised her to call us back for any questions/ problems and to follow closely with cardiology and she is comfortable with this plan.

## 2022-08-15 NOTE — Patient Instructions (Signed)
Call us in the future if you need us for anything 

## 2022-09-18 ENCOUNTER — Ambulatory Visit: Admit: 2022-09-18 | Discharge: 2022-09-19 | Payer: MEDICARE

## 2022-09-18 DIAGNOSIS — M1711 Unilateral primary osteoarthritis, right knee: Principal | ICD-10-CM

## 2022-09-18 DIAGNOSIS — M810 Age-related osteoporosis without current pathological fracture: Principal | ICD-10-CM

## 2022-09-18 DIAGNOSIS — M059 Rheumatoid arthritis with rheumatoid factor, unspecified: Principal | ICD-10-CM

## 2022-09-18 DIAGNOSIS — E559 Vitamin D deficiency, unspecified: Principal | ICD-10-CM

## 2022-09-18 DIAGNOSIS — M549 Dorsalgia, unspecified: Principal | ICD-10-CM

## 2022-09-18 DIAGNOSIS — Z79631 Methotrexate, long term, current use: Principal | ICD-10-CM

## 2022-09-25 ENCOUNTER — Other Ambulatory Visit: Payer: Self-pay

## 2022-09-27 ENCOUNTER — Ambulatory Visit: Payer: PRIVATE HEALTH INSURANCE | Admitting: Cardiology

## 2022-09-30 NOTE — Progress Notes (Signed)
Cardiology Office Note:    Date:  10/01/2022   ID:  Veronica Romero, DOB 03-Oct-1950, MRN [id number]  PCP:  Veronica Grooms, NP   [facility] Providers Cardiologist:  Veronica Ripple, DO Cardiology APP:  Veronica Duster, PA { Referring MD: Veronica Grooms, NP   Chief Complaint  Patient presents with   Follow-up    ICM, CAD    History of Present Illness:    Veronica Romero is a 72 y.o. female with a hx of CAD, mild pulmonary hypertension, RA on methotrexate, prednisone, and Humira, and chronic systolic heart failure felt secondary to ischemic cardiomyopathy.  She was hospitalized February 2023 with COVID-19 pneumonia and acute hypoxic respiratory failure.  She required intubation.  Echocardiogram revealed a new newly reduced LVEF of 25-30% with wall motion abnormality and a mildly elevated PASP.Marland Kitchen  After she was stabilized, she underwent right and left heart catheterization which revealed 85% stenosis in the ostial-mid LAD and hemodynamic findings consistent with mild pulmonary hypertension.  She was discharged and brought back for scheduled staged OCT guided complex atherectomy to the ostial-mid LAD with DES 3.0 x 30 mm on 07/17/2021.  Residual 50% mid RCA and 50% distal LCx managed medically.  GDMT was titrated.  She had a repeat echocardiogram with improved LVEF to 40-45%, grade 1 DD, and ascending aorta measuring 37 mm.  Echocardiogram 04/10/2022 with LVEF still 45%.    She is maintained on [place] (insurance denied [place]), carvedilol, Entresto, and spironolactone.  She was last seen by Veronica Romero 12/11/2021 and was doing well at that time.  She presents today for annual follow-up.  Overall doing well, no cardiac complaints. RA is controlled. She is confused on spironolactone dosing. BP today 120 systolic and she has not yet taken any medication. She states she has been alternating 12.5 mg spiro with 25 mg because she was unsure. Due to RA, she has trouble splitting the  tablets. BMP in march 2024 stable.    Past Medical History:  Diagnosis Date   Anemia    CHF (congestive heart failure) (HCC)    Coronary artery disease    Diabetes mellitus without complication (HCC)    Hypertension    ILD (interstitial lung disease) (HCC)    Ischemic cardiomyopathy 06/12/2021   EF 25-30%   Pulmonary HTN (HCC)    Rheumatoid arthritis (HCC)     Past Surgical History:  Procedure Laterality Date   BREAST BIOPSY Left    ?when//benign   CORONARY ANGIOPLASTY     CORONARY ATHERECTOMY N/A 07/17/2021   Procedure: CORONARY ATHERECTOMY;  Surgeon: [place], Peter M, MD;  Location: [facility] INVASIVE CV LAB;  Service: Cardiovascular;  Laterality: N/A;   RIGHT/LEFT HEART CATH AND CORONARY ANGIOGRAPHY N/A 06/16/2021   Procedure: RIGHT/LEFT HEART CATH AND CORONARY ANGIOGRAPHY;  Surgeon: Veronica Records, MD;  Location: [facility] INVASIVE CV LAB;  Service: Cardiovascular;  Laterality: N/A;    Current Medications: Current Meds  Medication Sig   Adalimumab (HUMIRA PEN) 40 MG/0.4ML PNKT Inject 0.4 mLs into the skin every Tuesday. Tuesday   aspirin EC 81 MG tablet Take 1 tablet (81 mg total) by mouth daily. Swallow whole.   Cholecalciferol (VITAMIN D3 PO) Take 1 tablet by mouth daily.   leucovorin (WELLCOVORIN) 5 MG tablet Take 15 mg by mouth every Saturday.   methotrexate 50 MG/2ML injection Inject 1 mL (25 mg total) into the muscle every Saturday. Hold methotrexate for 2 more weeks and then resume 3/10   Multiple Vitamin (MULTIVITAMIN  WITH MINERALS) TABS tablet Take 1 tablet by mouth daily.   Polyethyl Glycol-Propyl Glycol (LUBRICANT EYE DROPS) 0.4-0.3 % SOLN Place 1 drop into both eyes 3 (three) times daily as needed (dry/irritated eyes.).   predniSONE (DELTASONE) 5 MG tablet Take 10 mg by mouth in the morning.   sacubitril-valsartan (ENTRESTO) 24-26 MG Take 1 tablet by mouth 2 (two) times daily.   traMADol (ULTRAM) 50 MG tablet Take 50 mg by mouth every 6 (six) hours as needed (pain.).    [DISCONTINUED] carvedilol (COREG) 3.125 MG tablet Take 1 tablet (3.125 mg total) by mouth 2 (two) times daily with a meal.   [DISCONTINUED] clopidogrel (PLAVIX) 75 MG tablet Take 1 tablet (75 mg total) by mouth daily.   [DISCONTINUED] dapagliflozin propanediol (FARXIGA) 10 MG TABS tablet Take 1 tablet (10 mg total) by mouth daily before breakfast.   [DISCONTINUED] furosemide (LASIX) 40 MG tablet Take 20 mg by mouth daily.   [DISCONTINUED] nitroGLYCERIN (NITROSTAT) 0.4 MG SL tablet Place 1 tablet (0.4 mg total) under the tongue every 5 (five) minutes as needed.   [DISCONTINUED] rosuvastatin (CRESTOR) 40 MG tablet Take 1 tablet (40 mg total) by mouth daily.   [DISCONTINUED] spironolactone (ALDACTONE) 25 MG tablet Take 0.5 tablets (12.5 mg total) by mouth daily.     Allergies:   Penicillins   Social History   Socioeconomic History   Marital status: Married    Spouse name: Veronica Romero   Number of children: 1   Years of education: Not on file   Highest education level: Some college, no degree  Occupational History   Occupation: retired  Tobacco Use   Smoking status: Never   Smokeless tobacco: Never  Vaping Use   Vaping Use: Never used  Substance and Sexual Activity   Alcohol use: Yes    Alcohol/week: 2.0 standard drinks of alcohol    Types: 2 Glasses of wine per week   Drug use: Never   Sexual activity: Not Currently  Other Topics Concern   Not on file  Social History Narrative   Not on file   Social Determinants of Health   Financial Resource Strain: Low Risk  (07/18/2021)   Overall Financial Resource Strain (CARDIA)    Difficulty of Paying Living Expenses: Not very hard  Food Insecurity: No Food Insecurity (10/19/2021)   Hunger Vital Sign    Worried About Running Out of Food in the Last Year: Never true    Ran Out of Food in the Last Year: Never true  Transportation Needs: No Transportation Needs (10/19/2021)   PRAPARE - Administrator, Civil Service (Medical):  No    Lack of Transportation (Non-Medical): No  Physical Activity: Sufficiently Active (10/19/2021)   Exercise Vital Sign    Days of Exercise per Week: 3 days    Minutes of Exercise per Session: 60 min  Stress: Stress Concern Present (10/19/2021)   [employer] of Occupational Health - Occupational Stress Questionnaire    Feeling of Stress : To some extent  Social Connections: Moderately Isolated (10/19/2021)   Social Connection and Isolation Panel [NHANES]    Frequency of Communication with Friends and Family: More than three times a week    Frequency of Social Gatherings with Friends and Family: Once a week    Attends Religious Services: Never    Database administrator or Organizations: No    Attends Banker Meetings: Never    Marital Status: Married     Family History: The patient's  family history includes Autoimmune disease in her daughter; Heart disease in her father; Kidney disease in her brother.  ROS:   Please see the history of present illness.     All other systems reviewed and are negative.  EKGs/Labs/Other Studies Reviewed:    The following studies were reviewed today:  Cardiac Studies & Procedures   CARDIAC CATHETERIZATION  CARDIAC CATHETERIZATION 07/17/2021  Narrative   Ost LAD to Mid LAD lesion is 85% stenosed.   A drug-eluting stent was successfully placed using a STENT ONYX FRONTIER 3.0X30.   Post intervention, there is a 0% residual stenosis.  Successful PCI of the proximal to mid LAD with orbital atherectomy and DES x 1 with OCT guidance   Plan: will monitor overnight. Anticipate DC in am. DAPT for one year.  Findings Coronary Findings Diagnostic  Dominance: Left  Left Anterior Descending Ost LAD to Mid LAD lesion is 85% stenosed.  Left Circumflex  Left Posterior Atrioventricular Artery LPAV lesion is 50% stenosed.  Right Coronary Artery Prox RCA lesion is 50% stenosed.  Intervention  Ost LAD to Mid LAD lesion Stent CATH  VISTA GUIDE 6FR XBLAD3.5 guide catheter was inserted. Lesion crossed with guidewire using a WIRE VIPERWIRE COR FLEX .012. Pre-stent angioplasty was not performed. A drug-eluting stent was successfully placed using a STENT ONYX FRONTIER 3.0X30. Stent strut is well apposed. Post-stent angioplasty was performed using a BALL SAPPHIRE NC24 3.5X22. Maximum pressure:  18 atm. Atherectomy Orbital atherectomy was performed using a CROWN DIAMONDBACK CLASSIC 1.25. 3 passes taken. Post-Intervention Lesion Assessment The intervention was successful. Pre-interventional TIMI flow is 3. Post-intervention TIMI flow is 3. No complications occurred at this lesion. There is a 0% residual stenosis post intervention.   CARDIAC CATHETERIZATION  CARDIAC CATHETERIZATION 06/17/2021  Narrative   Ost LAD to Mid LAD lesion is 85% stenosed.   Prox RCA lesion is 50% stenosed.   LPAV lesion is 50% stenosed.   There is mild to moderate left ventricular systolic dysfunction.   LV end diastolic pressure is normal.   LV end diastolic pressure is normal.   The left ventricular ejection fraction is 35-45% by visual estimate.   Hemodynamic findings consistent with mild pulmonary hypertension.  CONCLUSIONS: Calcified segmental 85% proximal LAD Dominant circumflex without significant obstruction.  Distally before the PDA there is segmental 50% narrowing. Left main is widely patent Right coronary was poorly visualized, is nondominant, and contains mid 50% stenosis. Low normal to mild systolic dysfunction with EF in the 35 to 45% range.  LVEDP 14 mmHg. Mild pulmonary hypertension with mean PA pressure 22 mmHg; mean wedge pressure 14 mmHg; right atrial mean 6 mmHg.  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  RECOMMENDATION: Consider starting dual antiplatelet therapy if felt safe.  Recommend Plavix 75 mg/day. Continue to treat heart failure and recuperation from pneumonia. Orbital atherectomy and stenting of the LAD in days to weeks  once the patient is stronger. Earlier percutaneous management if the patient has ischemic symptoms. LV function is improving and is clearly better than 25 to 30% as noted above. Hemodynamics are reasonable and would start basic guideline directed therapy for heart failure.  Findings Coronary Findings Diagnostic  Dominance: Left  Left Anterior Descending Ost LAD to Mid LAD lesion is 85% stenosed.  Left Circumflex  Left Posterior Atrioventricular Artery LPAV lesion is 50% stenosed.  Right Coronary Artery Prox RCA lesion is 50% stenosed.  Intervention  No interventions have been documented.     ECHOCARDIOGRAM  ECHOCARDIOGRAM COMPLETE 04/10/2022  Narrative  ECHOCARDIOGRAM REPORT    Patient Name:   Veronica Romero Date of Exam: 04/10/2022 Medical Rec #:  [id number]       Height:       67.0 in Accession #:    [id number]      Weight:       164.4 lb Date of Birth:  06/22/1950      BSA:          1.860 Romero Patient Age:    71 years        BP:           144/65 mmHg Patient Gender: F               HR:           76 bpm. Exam Location:  [place]  Procedure: 2D Echo, 3D Echo, Cardiac Doppler, Color Doppler and Strain Analysis  Indications:    I42.0 Dilated cardiomyopathy  History:        Patient has prior history of Echocardiogram examinations, most recent 11/07/2021. Cardiomyopathy and CHF, CAD and Previous Myocardial Infarction, Pulmonary HTN, Signs/Symptoms:Chest Pain and Shortness of Breath; Risk Factors:Dyslipidemia and Non-Smoker.  Sonographer:    Quentin Ore RDMS, RVT, RDCS Referring Phys: [id number] KARDIE TOBB  IMPRESSIONS   1. Left ventricular ejection fraction, by estimation, is 45% 2. The left ventricle has mildly decreased function. The left ventricle demonstrates global hypokinesis. The left ventricular internal cavity size was mildly dilated. Left ventricular diastolic parameters are consistent with Grade I diastolic dysfunction (impaired relaxation). The average  left ventricular global longitudinal strain is -16.4 %. 3. Right ventricular systolic function is normal. The right ventricular size is normal. There is normal pulmonary artery systolic pressure. The estimated right ventricular systolic pressure is 18.8 mmHg. 4. Left atrial size was mildly dilated. 5. The mitral valve is normal in structure. Mild mitral valve regurgitation. No evidence of mitral stenosis. 6. The aortic valve has an indeterminant number of cusps. Aortic valve regurgitation is mild. Aortic valve sclerosis/calcification is present, without any evidence of aortic stenosis. 7. The inferior vena cava is normal in size with greater than 50% respiratory variability, suggesting right atrial pressure of 3 mmHg.  FINDINGS Left Ventricle: Left ventricular ejection fraction, by estimation, is 45%. The left ventricle has mildly decreased function. The left ventricle demonstrates global hypokinesis. The average left ventricular global longitudinal strain is -16.4 %. The left ventricular internal cavity size was mildly dilated. There is no left ventricular hypertrophy. Left ventricular diastolic parameters are consistent with Grade I diastolic dysfunction (impaired relaxation).  Right Ventricle: The right ventricular size is normal. No increase in right ventricular wall thickness. Right ventricular systolic function is normal. There is normal pulmonary artery systolic pressure. The tricuspid regurgitant velocity is 1.86 Romero/s, and with an assumed right atrial pressure of 5 mmHg, the estimated right ventricular systolic pressure is 18.8 mmHg.  Left Atrium: Left atrial size was mildly dilated.  Right Atrium: Right atrial size was normal in size.  Pericardium: There is no evidence of pericardial effusion.  Mitral Valve: The mitral valve is normal in structure. Mild mitral annular calcification. Mild mitral valve regurgitation. No evidence of mitral valve stenosis.  Tricuspid Valve: The tricuspid  valve is normal in structure. Tricuspid valve regurgitation is not demonstrated. No evidence of tricuspid stenosis.  Aortic Valve: The aortic valve has an indeterminant number of cusps. Aortic valve regurgitation is mild. Aortic regurgitation PHT measures 238 msec. Aortic valve sclerosis/calcification is present, without any evidence of aortic  stenosis. Aortic valve mean gradient measures 3.0 mmHg. Aortic valve peak gradient measures 6.8 mmHg. Aortic valve area, by VTI measures 1.94 cm.  Pulmonic Valve: The pulmonic valve was normal in structure. Pulmonic valve regurgitation is not visualized. No evidence of pulmonic stenosis.  Aorta: The aortic root is normal in size and structure.  Venous: The inferior vena cava is normal in size with greater than 50% respiratory variability, suggesting right atrial pressure of 3 mmHg.  IAS/Shunts: No atrial level shunt detected by color flow Doppler.   LEFT VENTRICLE PLAX 2D LVIDd:         5.30 cm      Diastology LVIDs:         3.80 cm      LV e' medial:    9.30 cm/s LV PW:         1.00 cm      LV E/e' medial:  7.1 LV IVS:        0.80 cm      LV e' lateral:   7.07 cm/s LVOT diam:     1.80 cm      LV E/e' lateral: 9.3 LV SV:         57 LV SV Index:   31           2D Longitudinal Strain LVOT Area:     2.54 cm     2D Strain GLS Avg:     -16.4 %  LV Volumes (MOD) LV vol d, MOD A2C: 86.5 ml  3D Volume EF: LV vol d, MOD A4C: 102.0 ml 3D EF:        52 % LV vol s, MOD A2C: 44.6 ml  LV EDV:       140 ml LV vol s, MOD A4C: 49.7 ml  LV ESV:       67 ml LV SV MOD A2C:     41.9 ml  LV SV:        73 ml LV SV MOD A4C:     102.0 ml LV SV MOD BP:      48.4 ml  RIGHT VENTRICLE          IVC RV Basal diam:  3.10 cm  IVC diam: 1.50 cm TAPSE (Romero-mode): 2.0 cm  LEFT ATRIUM             Index        RIGHT ATRIUM           Index LA diam:        4.00 cm 2.15 cm/Romero   RA Area:     11.90 cm LA Vol (A2C):   49.1 ml 26.39 ml/Romero  RA Volume:   26.50 ml  14.24 ml/Romero LA  Vol (A4C):   67.3 ml 36.17 ml/Romero LA Biplane Vol: 59.7 ml 32.09 ml/Romero AORTIC VALVE                    PULMONIC VALVE AV Area (Vmax):    1.89 cm     PV Vmax:       0.79 Romero/s AV Area (Vmean):   2.08 cm     PV Peak grad:  2.5 mmHg AV Area (VTI):     1.94 cm AV Vmax:           130.00 cm/s AV Vmean:          85.900 cm/s AV VTI:            0.294 Romero AV Peak  Grad:      6.8 mmHg AV Mean Grad:      3.0 mmHg LVOT Vmax:         96.60 cm/s LVOT Vmean:        70.300 cm/s LVOT VTI:          0.224 Romero LVOT/AV VTI ratio: 0.76 AI PHT:            238 msec AR Vena Contracta: 0.30 cm  AORTA Ao Root diam: 3.40 cm Ao Asc diam:  3.60 cm Ao Arch diam: 3.0 cm  MITRAL VALVE                TRICUSPID VALVE MV Area (PHT): 3.27 cm     TR Peak grad:   13.8 mmHg MV Decel Time: 232 msec     TR Vmax:        186.00 cm/s MV E velocity: 66.00 cm/s MV A velocity: 105.00 cm/s  SHUNTS MV E/A ratio:  0.63         Systemic VTI:  0.22 Romero Systemic Diam: 1.80 cm  Julien Nordmann MD Electronically signed by Julien Nordmann MD Signature Date/Time: 04/10/2022/9:56:48 PM    Final              EKG:  EKG is  ordered today.  The ekg ordered today demonstrates sinus rhythm with HR 73, poor R wave progression  Recent Labs: 07/18/2022: ALT 18; BUN 12; Creatinine, Ser 0.86; Hemoglobin CANCELED; Platelets CANCELED; Potassium 3.9; Sodium 143  Recent Lipid Panel    Component Value Date/Time   CHOL 158 07/18/2022 0954   TRIG 95 07/18/2022 0954   HDL 75 07/18/2022 0954   CHOLHDL 2.1 07/18/2022 0954   CHOLHDL 4.7 06/16/2021 0208   VLDL 23 06/16/2021 0208   LDLCALC 66 07/18/2022 0954     Risk Assessment/Calculations:                Physical Exam:    VS:  BP 120/64 (BP Location: Left Arm, Patient Position: Sitting, Cuff Size: Normal)   Pulse 73   Ht 5\' 7"  (1.702 Romero)   Wt 164 lb (74.4 kg)   SpO2 94%   BMI 25.69 kg/Romero     Wt Readings from Last 3 Encounters:  10/01/22 164 lb (74.4 kg)  08/15/22 167 lb 2 oz (75.8  kg)  07/18/22 168 lb 11.2 oz (76.5 kg)     GEN:  Well nourished, well developed in no acute distress HEENT: Normal NECK: No JVD; No carotid bruits LYMPHATICS: No lymphadenopathy CARDIAC: RRR, no murmurs, rubs, gallops RESPIRATORY:  Clear to auscultation without rales, wheezing or rhonchi  ABDOMEN: Soft, non-tender, non-distended MUSCULOSKELETAL:  No edema; RA appearance in hands bilaterally SKIN: Warm and dry NEUROLOGIC:  Alert and oriented x 3 PSYCHIATRIC:  Normal affect   ASSESSMENT:    1. Coronary artery disease involving native coronary artery of native heart without angina pectoris   2. CAD S/P percutaneous coronary angioplasty   3. Ischemic cardiomyopathy   4. HFrEF (heart failure with reduced ejection fraction) (HCC)   5. Primary hypertension   6. Hyperlipidemia with target LDL less than 70   7. Rheumatoid arthritis, involving unspecified site, unspecified whether rheumatoid factor present [facility])    PLAN:    In order of problems listed above:  CAD Atherectomy/DES-O-mid LAD 06/2021 - Remains on DAPT with aspirin and Plavix - continue coreg, Farxiga, Crestor - will message Veronica Romero to see if she wants to continue plavix or drop to  ASA monotherapy - will see her in 6 months.   Chronic systolic heart failure Ischemic cardiomyopathy LVEF stable at 45% - Remains on 3.125 mg Coreg twice daily, 10 mg Farxiga, 24-26 mg Entresto twice daily, 12.5 mg spironolactone - she has been alternating 12.5 mg and 25 mg spironolactone - Her systolic blood pressure was 120 today in the office prior to carvedilol, Entresto, and spironolactone.  I do not think that she has pressure room to take 25 mg spironolactone every day.  However, due to her heart rate it is difficult to split pills.  I will prescribe 12.5 mg spironolactone with the option of extra tablets (instructions on bottle say to take an extra 12.5 mg for swelling to give her extra tablets to use)   Hypertension Managed in the  context of ischemic cardiomyopathy   Hyperlipidemia with LDL goal less than 70 Continue 40 mg Crestor 07/18/2022: Cholesterol, Total 158; HDL 75; LDL Chol Calc (NIH) 66; Triglycerides 95   Rheumatoid arthritis - Methotrexate, Humira, prednisone -RA is stable           Medication Adjustments/Labs and Tests Ordered: Current medicines are reviewed at length with the patient today.  Concerns regarding medicines are outlined above.  Orders Placed This Encounter  Procedures   EKG 12-Lead   Meds ordered this encounter  Medications   carvedilol (COREG) 3.125 MG tablet    Sig: Take 1 tablet (3.125 mg total) by mouth 2 (two) times daily with a meal.    Dispense:  90 tablet    Refill:  3   clopidogrel (PLAVIX) 75 MG tablet    Sig: Take 1 tablet (75 mg total) by mouth daily.    Dispense:  90 tablet    Refill:  3   DISCONTD: dapagliflozin propanediol (FARXIGA) 10 MG TABS tablet    Sig: Take 1 tablet (10 mg total) by mouth daily before breakfast.    Dispense:  90 tablet    Refill:  3   furosemide (LASIX) 40 MG tablet    Sig: Take 0.5 tablets (20 mg total) by mouth daily.    Dispense:  30 tablet    Refill:  3   nitroGLYCERIN (NITROSTAT) 0.4 MG SL tablet    Sig: Place 1 tablet (0.4 mg total) under the tongue every 5 (five) minutes as needed.    Dispense:  25 tablet    Refill:  2   rosuvastatin (CRESTOR) 40 MG tablet    Sig: Take 1 tablet (40 mg total) by mouth daily.    Dispense:  90 tablet    Refill:  3   spironolactone (ALDACTONE) 25 MG tablet    Sig: Take 0.5 tablets (12.5 mg total) by mouth daily. Take additional Tablet 12.5 mg for swelling    Dispense:  135 tablet    Refill:  2   dapagliflozin propanediol (FARXIGA) 10 MG TABS tablet    Sig: Take 1 tablet (10 mg total) by mouth daily before breakfast.    Dispense:  90 tablet    Refill:  3    Patient Instructions  Medication Instructions:  No Changes *If you need a refill on your cardiac medications before your next  appointment, please call your pharmacy*   Lab Work: No Labs If you have labs (blood work) drawn today and your tests are completely normal, you will receive your results only by: MyChart Message (if you have MyChart) OR A paper copy in the mail If you have any lab test that is abnormal  or we need to change your treatment, we will call you to review the results.   Testing/Procedures: No Testing   Follow-Up: At [facility], you and your health needs are our priority.  As part of our continuing mission to provide you with exceptional heart care, we have created designated Provider Care Teams.  These Care Teams include your primary Cardiologist (physician) and Advanced Practice Providers (APPs -  Physician Assistants and Nurse Practitioners) who all work together to provide you with the care you need, when you need it.  We recommend signing up for the patient portal called "MyChart".  Sign up information is provided on this After Visit Summary.  MyChart is used to connect with patients for Virtual Visits (Telemedicine).  Patients are able to view lab/test results, encounter notes, upcoming appointments, etc.  Non-urgent messages can be sent to your provider as well.   To learn more about what you can do with MyChart, go to [email or web address].    Your next appointment:   6 month(s)  Provider:   Thomasene Ripple, DO  MD Only    Signed, Veronica Romero, [place]  10/01/2022 12:36 PM    [facility]

## 2022-10-01 ENCOUNTER — Encounter: Payer: Self-pay | Admitting: Physician Assistant

## 2022-10-01 ENCOUNTER — Ambulatory Visit: Payer: Medicare Other | Attending: Cardiology | Admitting: Physician Assistant

## 2022-10-01 VITALS — BP 120/64 | HR 73 | Ht 67.0 in | Wt 164.0 lb

## 2022-10-01 DIAGNOSIS — M069 Rheumatoid arthritis, unspecified: Secondary | ICD-10-CM | POA: Diagnosis present

## 2022-10-01 DIAGNOSIS — I255 Ischemic cardiomyopathy: Secondary | ICD-10-CM | POA: Diagnosis not present

## 2022-10-01 DIAGNOSIS — I10 Essential (primary) hypertension: Secondary | ICD-10-CM

## 2022-10-01 DIAGNOSIS — Z9861 Coronary angioplasty status: Secondary | ICD-10-CM | POA: Diagnosis present

## 2022-10-01 DIAGNOSIS — E785 Hyperlipidemia, unspecified: Secondary | ICD-10-CM | POA: Diagnosis present

## 2022-10-01 DIAGNOSIS — I2510 Atherosclerotic heart disease of native coronary artery without angina pectoris: Secondary | ICD-10-CM | POA: Diagnosis not present

## 2022-10-01 DIAGNOSIS — I5020 Unspecified systolic (congestive) heart failure: Secondary | ICD-10-CM | POA: Diagnosis not present

## 2022-10-01 MED ORDER — SPIRONOLACTONE 25 MG PO TABS: 12.5000 mg | ORAL_TABLET | Freq: Every day | ORAL | 2 refills | Status: DC

## 2022-10-01 MED ORDER — CLOPIDOGREL BISULFATE 75 MG PO TABS: 75.0000 mg | ORAL_TABLET | Freq: Every day | ORAL | 3 refills | Status: DC

## 2022-10-01 MED ORDER — DAPAGLIFLOZIN PROPANEDIOL 10 MG PO TABS: 10.0000 mg | ORAL_TABLET | Freq: Every day | ORAL | 3 refills | Status: DC

## 2022-10-01 MED ORDER — ROSUVASTATIN CALCIUM 40 MG PO TABS: 40.0000 mg | ORAL_TABLET | Freq: Every day | ORAL | 3 refills | Status: DC

## 2022-10-01 MED ORDER — CARVEDILOL 3.125 MG PO TABS: 3.1250 mg | ORAL_TABLET | Freq: Two times a day (BID) | ORAL | 3 refills | Status: DC

## 2022-10-01 MED ORDER — NITROGLYCERIN 0.4 MG SL SUBL: 0.4000 mg | SUBLINGUAL_TABLET | SUBLINGUAL | 2 refills | Status: AC | PRN

## 2022-10-01 MED ORDER — FUROSEMIDE 40 MG PO TABS: 20.0000 mg | ORAL_TABLET | Freq: Every day | ORAL | 3 refills | Status: DC

## 2022-10-01 NOTE — Patient Instructions (Addendum)
Medication Instructions:  No Changes *If you need a refill on your cardiac medications before your next appointment, please call your pharmacy*   Lab Work: No Labs If you have labs (blood work) drawn today and your tests are completely normal, you will receive your results only by: MyChart Message (if you have MyChart) OR A paper copy in the mail If you have any lab test that is abnormal or we need to change your treatment, we will call you to review the results.   Testing/Procedures: No Testing   Follow-Up: At [facility], you and your health needs are our priority.  As part of our continuing mission to provide you with exceptional heart care, we have created designated Provider Care Teams.  These Care Teams include your primary Cardiologist (physician) and Advanced Practice Providers (APPs -  Physician Assistants and Nurse Practitioners) who all work together to provide you with the care you need, when you need it.  We recommend signing up for the patient portal called "MyChart".  Sign up information is provided on this After Visit Summary.  MyChart is used to connect with patients for Virtual Visits (Telemedicine).  Patients are able to view lab/test results, encounter notes, upcoming appointments, etc.  Non-urgent messages can be sent to your provider as well.   To learn more about what you can do with MyChart, go to [email or web address].    Your next appointment:   6 month(s)  Provider:   Thomasene Ripple, DO  MD Only

## 2022-10-22 ENCOUNTER — Ambulatory Visit (INDEPENDENT_AMBULATORY_CARE_PROVIDER_SITE_OTHER): Payer: Medicare Other

## 2022-10-22 VITALS — Ht 67.0 in | Wt 164.0 lb

## 2022-10-22 DIAGNOSIS — Z0000 Encounter for general adult medical examination without abnormal findings: Secondary | ICD-10-CM | POA: Diagnosis not present

## 2022-10-22 NOTE — Patient Instructions (Signed)
Veronica Romero , Thank you for taking time to come for your Medicare Wellness Visit. I appreciate your ongoing commitment to your health goals. Please review the following plan we discussed and let me know if I can assist you in the future.   These are the goals we discussed:  Goals      DIET - EAT MORE FRUITS AND VEGETABLES     Increase physical activity        This is a list of the screening recommended for you and due dates:  Health Maintenance  Topic Date Due   Yearly kidney health urinalysis for diabetes  Never done   DTaP/Tdap/Td vaccine (1 - Tdap) Never done   Zoster (Shingles) Vaccine (1 of 2) Never done   Colon Cancer Screening  Never done   COVID-19 Vaccine (5 - 2023-24 season) 07/18/2022   Flu Shot  11/29/2022   Mammogram  02/16/2023   Yearly kidney function blood test for diabetes  07/18/2023   Medicare Annual Wellness Visit  10/22/2023   Pneumonia Vaccine  Completed   DEXA scan (bone density measurement)  Completed   Hepatitis C Screening  Completed   HPV Vaccine  Aged Out    Advanced directives: no  Conditions/risks identified: none  Next appointment: Follow up in one year for your annual wellness visit 10/28/23 @ 9:45 am by phone   Preventive Care 74 Years and Older, Female Preventive care refers to lifestyle choices and visits with your health care provider that can promote health and wellness. What does preventive care include? A yearly physical exam. This is also called an annual well check. Dental exams once or twice a year. Routine eye exams. Ask your health care provider how often you should have your eyes checked. Personal lifestyle choices, including: Daily care of your teeth and gums. Regular physical activity. Eating a healthy diet. Avoiding tobacco and drug use. Limiting alcohol use. Practicing safe sex. Taking low-dose aspirin every day. Taking vitamin and mineral supplements as recommended by your health care provider. What happens during an  annual well check? The services and screenings done by your health care provider during your annual well check will depend on your age, overall health, lifestyle risk factors, and family history of disease. Counseling  Your health care provider may ask you questions about your: Alcohol use. Tobacco use. Drug use. Emotional well-being. Home and relationship well-being. Sexual activity. Eating habits. History of falls. Memory and ability to understand (cognition). Work and work Astronomer. Reproductive health. Screening  You may have the following tests or measurements: Height, weight, and BMI. Blood pressure. Lipid and cholesterol levels. These may be checked every 5 years, or more frequently if you are over 20 years old. Skin check. Lung cancer screening. You may have this screening every year starting at age 74 if you have a 30-pack-year history of smoking and currently smoke or have quit within the past 15 years. Fecal occult blood test (FOBT) of the stool. You may have this test every year starting at age 74. Flexible sigmoidoscopy or colonoscopy. You may have a sigmoidoscopy every 5 years or a colonoscopy every 10 years starting at age 74. Hepatitis C blood test. Hepatitis B blood test. Sexually transmitted disease (STD) testing. Diabetes screening. This is done by checking your blood sugar (glucose) after you have not eaten for a while (fasting). You may have this done every 1-3 years. Bone density scan. This is done to screen for osteoporosis. You may have this done starting at  age 74. Mammogram. This may be done every 1-2 years. Talk to your health care provider about how often you should have regular mammograms. Talk with your health care provider about your test results, treatment options, and if necessary, the need for more tests. Vaccines  Your health care provider may recommend certain vaccines, such as: Influenza vaccine. This is recommended every year. Tetanus,  diphtheria, and acellular pertussis (Tdap, Td) vaccine. You may need a Td booster every 10 years. Zoster vaccine. You may need this after age 25. Pneumococcal 13-valent conjugate (PCV13) vaccine. One dose is recommended after age 74. Pneumococcal polysaccharide (PPSV23) vaccine. One dose is recommended after age 74. Talk to your health care provider about which screenings and vaccines you need and how often you need them. This information is not intended to replace advice given to you by your health care provider. Make sure you discuss any questions you have with your health care provider. Document Released: 05/13/2015 Document Revised: 01/04/2016 Document Reviewed: 02/15/2015 Elsevier Interactive Patient Education  2017 [employer] Prevention in the Home Falls can cause injuries. They can happen to people of all ages. There are many things you can do to make your home safe and to help prevent falls. What can I do on the outside of my home? Regularly fix the edges of walkways and driveways and fix any cracks. Remove anything that might make you trip as you walk through a door, such as a raised step or threshold. Trim any bushes or trees on the path to your home. Use bright outdoor lighting. Clear any walking paths of anything that might make someone trip, such as rocks or tools. Regularly check to see if handrails are loose or broken. Make sure that both sides of any steps have handrails. Any raised decks and porches should have guardrails on the edges. Have any leaves, snow, or ice cleared regularly. Use sand or salt on walking paths during winter. Clean up any spills in your garage right away. This includes oil or grease spills. What can I do in the bathroom? Use night lights. Install grab bars by the toilet and in the tub and shower. Do not use towel bars as grab bars. Use non-skid mats or decals in the tub or shower. If you need to sit down in the shower, use a plastic,  non-slip stool. Keep the floor dry. Clean up any water that spills on the floor as soon as it happens. Remove soap buildup in the tub or shower regularly. Attach bath mats securely with double-sided non-slip rug tape. Do not have throw rugs and other things on the floor that can make you trip. What can I do in the bedroom? Use night lights. Make sure that you have a light by your bed that is easy to reach. Do not use any sheets or blankets that are too big for your bed. They should not hang down onto the floor. Have a firm chair that has side arms. You can use this for support while you get dressed. Do not have throw rugs and other things on the floor that can make you trip. What can I do in the kitchen? Clean up any spills right away. Avoid walking on wet floors. Keep items that you use a lot in easy-to-reach places. If you need to reach something above you, use a strong step stool that has a grab bar. Keep electrical cords out of the way. Do not use floor polish or wax that makes floors  slippery. If you must use wax, use non-skid floor wax. Do not have throw rugs and other things on the floor that can make you trip. What can I do with my stairs? Do not leave any items on the stairs. Make sure that there are handrails on both sides of the stairs and use them. Fix handrails that are broken or loose. Make sure that handrails are as long as the stairways. Check any carpeting to make sure that it is firmly attached to the stairs. Fix any carpet that is loose or worn. Avoid having throw rugs at the top or bottom of the stairs. If you do have throw rugs, attach them to the floor with carpet tape. Make sure that you have a light switch at the top of the stairs and the bottom of the stairs. If you do not have them, ask someone to add them for you. What else can I do to help prevent falls? Wear shoes that: Do not have high heels. Have rubber bottoms. Are comfortable and fit you well. Are closed  at the toe. Do not wear sandals. If you use a stepladder: Make sure that it is fully opened. Do not climb a closed stepladder. Make sure that both sides of the stepladder are locked into place. Ask someone to hold it for you, if possible. Clearly mark and make sure that you can see: Any grab bars or handrails. First and last steps. Where the edge of each step is. Use tools that help you move around (mobility aids) if they are needed. These include: Canes. Walkers. Scooters. Crutches. Turn on the lights when you go into a dark area. Replace any light bulbs as soon as they burn out. Set up your furniture so you have a clear path. Avoid moving your furniture around. If any of your floors are uneven, fix them. If there are any pets around you, be aware of where they are. Review your medicines with your doctor. Some medicines can make you feel dizzy. This can increase your chance of falling. Ask your doctor what other things that you can do to help prevent falls. This information is not intended to replace advice given to you by your health care provider. Make sure you discuss any questions you have with your health care provider. Document Released: 02/10/2009 Document Revised: 09/22/2015 Document Reviewed: 05/21/2014 Elsevier Interactive Patient Education  2017 [employer].

## 2022-10-22 NOTE — Progress Notes (Signed)
Subjective:   Veronica Romero is a 72 y.o. female who presents for Medicare Annual (Subsequent) preventive examination.  Visit Complete: Virtual  I connected with  Veronica Romero on 10/22/22 by a audio enabled telemedicine application and verified that I am speaking with the correct person using two identifiers.  Patient Location: Home  Provider Location: Office/Clinic  I discussed the limitations of evaluation and management by telemedicine. The patient expressed understanding and agreed to proceed.  Review of Systems     Cardiac Risk Factors include: advanced age (>72men, >6 women);hypertension     Objective:    Today's Vitals   10/22/22 1145  Weight: 164 lb (74.4 kg)  Height: 5\' 7"  (1.702 m)   Body mass index is 25.69 kg/m.     10/22/2022   11:36 AM 10/19/2021   11:36 AM 10/06/2021    1:15 PM 07/17/2021    6:35 AM 06/20/2021    2:29 AM 06/12/2021    1:33 AM  Advanced Directives  Does Patient Have a Medical Advance Directive? No No Yes Yes  No  Type of Best boy of [place];Living will Living will    Does patient want to make changes to medical advance directive?    No - Patient declined    Would patient like information on creating a medical advance directive? No - Patient declined No - Patient declined  No - Patient declined No - Patient declined     Current Medications (verified) Outpatient Encounter Medications as of 10/22/2022  Medication Sig   Adalimumab (HUMIRA PEN) 40 MG/0.4ML PNKT Inject 0.4 mLs into the skin every Tuesday. Tuesday   aspirin EC 81 MG tablet Take 1 tablet (81 mg total) by mouth daily. Swallow whole.   carvedilol (COREG) 3.125 MG tablet Take 1 tablet (3.125 mg total) by mouth 2 (two) times daily with a meal.   Cholecalciferol (VITAMIN D3 PO) Take 1 tablet by mouth daily.   clopidogrel (PLAVIX) 75 MG tablet Take 1 tablet (75 mg total) by mouth daily.   dapagliflozin propanediol (FARXIGA) 10 MG TABS tablet Take 1  tablet (10 mg total) by mouth daily before breakfast.   furosemide (LASIX) 40 MG tablet Take 0.5 tablets (20 mg total) by mouth daily.   leucovorin (WELLCOVORIN) 5 MG tablet Take 15 mg by mouth every Saturday.   methotrexate 50 MG/2ML injection Inject 1 mL (25 mg total) into the muscle every Saturday. Hold methotrexate for 2 more weeks and then resume 3/10   Multiple Vitamin (MULTIVITAMIN WITH MINERALS) TABS tablet Take 1 tablet by mouth daily.   nitroGLYCERIN (NITROSTAT) 0.4 MG SL tablet Place 1 tablet (0.4 mg total) under the tongue every 5 (five) minutes as needed.   Polyethyl Glycol-Propyl Glycol (LUBRICANT EYE DROPS) 0.4-0.3 % SOLN Place 1 drop into both eyes 3 (three) times daily as needed (dry/irritated eyes.).   predniSONE (DELTASONE) 5 MG tablet Take 10 mg by mouth in the morning.   rosuvastatin (CRESTOR) 40 MG tablet Take 1 tablet (40 mg total) by mouth daily.   sacubitril-valsartan (ENTRESTO) 24-26 MG Take 1 tablet by mouth 2 (two) times daily.   spironolactone (ALDACTONE) 25 MG tablet Take 0.5 tablets (12.5 mg total) by mouth daily. Take additional Tablet 12.5 mg for swelling   traMADol (ULTRAM) 50 MG tablet Take 50 mg by mouth every 6 (six) hours as needed (pain.).   No facility-administered encounter medications on file as of 10/22/2022.    Allergies (verified) Penicillins   History:  Past Medical History:  Diagnosis Date   Anemia    CHF (congestive heart failure) (HCC)    Coronary artery disease    Diabetes mellitus without complication (HCC)    Hypertension    ILD (interstitial lung disease) (HCC)    Ischemic cardiomyopathy 06/12/2021   EF 25-30%   Pulmonary HTN (HCC)    Rheumatoid arthritis (HCC)    Past Surgical History:  Procedure Laterality Date   BREAST BIOPSY Left    ?when//benign   CORONARY ANGIOPLASTY     CORONARY ATHERECTOMY N/A 07/17/2021   Procedure: CORONARY ATHERECTOMY;  Surgeon: [place], Peter M, MD;  Location: [facility] INVASIVE CV LAB;  Service:  Cardiovascular;  Laterality: N/A;   RIGHT/LEFT HEART CATH AND CORONARY ANGIOGRAPHY N/A 06/16/2021   Procedure: RIGHT/LEFT HEART CATH AND CORONARY ANGIOGRAPHY;  Surgeon: Lyn Records, MD;  Location: [facility] INVASIVE CV LAB;  Service: Cardiovascular;  Laterality: N/A;   Family History  Problem Relation Age of Onset   Heart disease Father    Kidney disease Brother    Autoimmune disease Daughter    Social History   Socioeconomic History   Marital status: Married    Spouse name: Veronica Romero   Number of children: 1   Years of education: Not on file   Highest education level: Some college, no degree  Occupational History   Occupation: retired  Tobacco Use   Smoking status: Never   Smokeless tobacco: Never  Vaping Use   Vaping Use: Never used  Substance and Sexual Activity   Alcohol use: Yes    Alcohol/week: 2.0 standard drinks of alcohol    Types: 2 Glasses of wine per week   Drug use: Never   Sexual activity: Not Currently  Other Topics Concern   Not on file  Social History Narrative   Not on file   Social Determinants of Health   Financial Resource Strain: Low Risk  (10/22/2022)   Overall Financial Resource Strain (CARDIA)    Difficulty of Paying Living Expenses: Not very hard  Food Insecurity: No Food Insecurity (10/22/2022)   Hunger Vital Sign    Worried About Running Out of Food in the Last Year: Never true    Ran Out of Food in the Last Year: Never true  Transportation Needs: No Transportation Needs (10/22/2022)   PRAPARE - Administrator, Civil Service (Medical): No    Lack of Transportation (Non-Medical): No  Physical Activity: Insufficiently Active (10/22/2022)   Exercise Vital Sign    Days of Exercise per Week: 2 days    Minutes of Exercise per Session: 20 min  Stress: No Stress Concern Present (10/22/2022)   [employer] of Occupational Health - Occupational Stress Questionnaire    Feeling of Stress : Not at all  Social Connections: Socially  Isolated (10/22/2022)   Social Connection and Isolation Panel [NHANES]    Frequency of Communication with Friends and Family: More than three times a week    Frequency of Social Gatherings with Friends and Family: Not on file    Attends Religious Services: Never    Active Member of Clubs or Organizations: No    Attends Banker Meetings: Never    Marital Status: Widowed    Tobacco Counseling Counseling given: Not Answered   Clinical Intake:  Pre-visit preparation completed: Yes  Pain : No/denies pain     Nutritional Risks: None Diabetes: No  How often do you need to have someone help you when you read instructions, pamphlets, or  other written materials from your doctor or pharmacy?: 1 - Never  Interpreter Needed?: No  Information entered by :: Kennedy Bucker, LPN   Activities of Daily Living    10/22/2022   11:29 AM 01/29/2022    9:47 AM  In your present state of health, do you have any difficulty performing the following activities:  Hearing? 0 0  Vision? 0 0  Comment readers   Difficulty concentrating or making decisions? 0 0  Walking or climbing stairs? 1 0  Comment ankle issues   Dressing or bathing? 0 0  Doing errands, shopping? 0 0  Preparing Food and eating ? N   Using the Toilet? N   In the past six months, have you accidently leaked urine? N   Do you have problems with loss of bowel control? N   Managing your Medications? N   Managing your Finances? N   Housekeeping or managing your Housekeeping? N     Patient Care Team: Larae Grooms, NP as PCP - General (Nurse Practitioner) Thomasene Ripple, DO as PCP - Cardiology (Cardiology) Duke, Roe Rutherford, PA as Physician Assistant (Cardiology)  Indicate any recent Medical Services you may have received from other than [facility] providers in the past year (date may be approximate).     Assessment:   This is a routine wellness examination for Foye.  Hearing/Vision screen Hearing Screening -  Comments:: No aids Vision Screening - Comments:: Readers- [facility]  Dietary issues and exercise activities discussed:     Goals Addressed             This Visit's Progress    DIET - EAT MORE FRUITS AND VEGETABLES         Depression Screen    10/22/2022   11:33 AM 07/18/2022    9:40 AM 02/22/2022    3:46 PM 01/17/2022    9:44 AM 01/16/2022   10:22 AM 12/19/2021   10:07 AM 11/23/2021    3:55 PM  PHQ 2/9 Scores  PHQ - 2 Score 0 2 3 3 3 1 2   PHQ- 9 Score 0 7 9 11 8 6 9     Fall Risk    10/22/2022   11:36 AM 07/18/2022    9:39 AM 01/29/2022    9:47 AM 01/17/2022    9:44 AM 10/19/2021   11:36 AM  Fall Risk   Falls in the past year? 0 0 0 0 0  Number falls in past yr: 0 0 0 1 0  Injury with Fall? 0 0 0 0 0  Risk for fall due to : No Fall Risks No Fall Risks  History of fall(s)   Follow up Falls prevention discussed;Falls evaluation completed Falls evaluation completed Falls evaluation completed Falls evaluation completed Falls evaluation completed;Education provided;Falls prevention discussed    MEDICARE RISK AT HOME:  Medicare Risk at Home - 10/22/22 1136     Any stairs in or around the home? No    If so, are there any without handrails? No    Home free of loose throw rugs in walkways, pet beds, electrical cords, etc? Yes    Adequate lighting in your home to reduce risk of falls? Yes    Life alert? No    Use of a cane, walker or w/c? Yes   cane if need it   Grab bars in the bathroom? No    Shower chair or bench in shower? No    Elevated toilet seat or a handicapped toilet? No  TIMED UP AND GO:  Was the test performed?  No    Cognitive Function:        10/22/2022   11:41 AM 10/19/2021   11:38 AM  6CIT Screen  What Year? 0 points 0 points  What month? 0 points 0 points  What time? 0 points 0 points  Count back from 20 0 points 0 points  Months in reverse 0 points 0 points  Repeat phrase 0 points 0 points  Total Score 0 points 0 points     Immunizations Immunization History  Administered Date(s) Administered   Covid-19, Mrna,Vaccine(Spikevax)63yrs and older 05/23/2022   Influenza,inj,Quad PF,6+ Mos 01/21/2014, 02/01/2015, 02/09/2016, 02/21/2017, 02/13/2018, 01/04/2022   Influenza-Unspecified 03/11/2019, 02/10/2020, 03/02/2021   PFIZER(Purple Top)SARS-COV-2 Vaccination 06/25/2019, 07/16/2019, 03/01/2020   Pneumococcal Conjugate-13 01/27/2015   Pneumococcal Polysaccharide-23 02/09/2016    TDAP status: Due, Education has been provided regarding the importance of this vaccine. Advised may receive this vaccine at local pharmacy or Health Dept. Aware to provide a copy of the vaccination record if obtained from local pharmacy or Health Dept. Verbalized acceptance and understanding.  Flu Vaccine status: Up to date  Pneumococcal vaccine status: Up to date  Covid-19 vaccine status: Completed vaccines  Qualifies for Shingles Vaccine? Yes   Zostavax completed No   Shingrix Completed?: No.    Education has been provided regarding the importance of this vaccine. Patient has been advised to call insurance company to determine out of pocket expense if they have not yet received this vaccine. Advised may also receive vaccine at local pharmacy or Health Dept. Verbalized acceptance and understanding.  Screening Tests Health Maintenance  Topic Date Due   Diabetic kidney evaluation - Urine ACR  Never done   DTaP/Tdap/Td (1 - Tdap) Never done   Zoster Vaccines- Shingrix (1 of 2) Never done   Colonoscopy  Never done   COVID-19 Vaccine (5 - 2023-24 season) 07/18/2022   INFLUENZA VACCINE  11/29/2022   MAMMOGRAM  02/16/2023   Diabetic kidney evaluation - eGFR measurement  07/18/2023   Medicare Annual Wellness (AWV)  10/22/2023   Pneumonia Vaccine 80+ Years old  Completed   DEXA SCAN  Completed   Hepatitis C Screening  Completed   HPV VACCINES  Aged Out    Health Maintenance  Health Maintenance Due  Topic Date Due   Diabetic  kidney evaluation - Urine ACR  Never done   DTaP/Tdap/Td (1 - Tdap) Never done   Zoster Vaccines- Shingrix (1 of 2) Never done   Colonoscopy  Never done   COVID-19 Vaccine (5 - 2023-24 season) 07/18/2022    Colorectal cancer screening: Type of screening: Cologuard. Completed PT DOESN'T REMEMBER DATE. Repeat every 3 years  Mammogram status: Completed 02/15/22. Repeat every year  Bone Density status: Completed 07/27/21. Results reflect: Bone density results: OSTEOPENIA. Repeat every 5 years.  Lung Cancer Screening: (Low Dose CT Chest recommended if Age 94-80 years, 20 pack-year currently smoking OR have quit w/in 15years.) does not qualify.    Additional Screening:  Hepatitis C Screening: does qualify; Completed 01/17/22  Vision Screening: Recommended annual ophthalmology exams for early detection of glaucoma and other disorders of the eye. Is the patient up to date with their annual eye exam?  Yes  Who is the provider or what is the name of the office in which the patient attends annual eye exams? [facility] If pt is not established with a provider, would they like to be referred to a provider to establish care? No .  Dental Screening: Recommended annual dental exams for proper oral hygiene   Community Resource Referral / Chronic Care Management: CRR required this visit?  No   CCM required this visit?  No     Plan:     I have personally reviewed and noted the following in the patient's chart:   Medical and social history Use of alcohol, tobacco or illicit drugs  Current medications and supplements including opioid prescriptions. Patient is not currently taking opioid prescriptions. Functional ability and status Nutritional status Physical activity Advanced directives List of other physicians Hospitalizations, surgeries, and ER visits in previous 12 months Vitals Screenings to include cognitive, depression, and falls Referrals and appointments  In addition, I have reviewed  and discussed with patient certain preventive protocols, quality metrics, and best practice recommendations. A written personalized care plan for preventive services as well as general preventive health recommendations were provided to patient.     Hal Hope, LPN   1/61/0960   After Visit Summary: (Declined) Due to this being a telephonic visit, with patients personalized plan was offered to patient but patient Declined AVS at this time   Nurse Notes: NONE

## 2022-11-15 MED ORDER — BD TUBERCULIN SYRINGE 1 ML 27 X 1/2"
3 refills | 350 days | Status: CP
Start: 2022-11-15 — End: ?

## 2022-12-19 MED ORDER — BD TUBERCULIN SYRINGE 1 ML 27 X 1/2"
3 refills | 350 days | Status: CP
Start: 2022-12-19 — End: ?
  Filled 2023-01-04: qty 12, 84d supply, fill #0

## 2023-01-18 ENCOUNTER — Ambulatory Visit (INDEPENDENT_AMBULATORY_CARE_PROVIDER_SITE_OTHER): Payer: Medicare Other | Admitting: Family Medicine

## 2023-01-18 VITALS — BP 137/72 | HR 84 | Temp 97.8°F | Ht 65.75 in | Wt 159.2 lb

## 2023-01-18 DIAGNOSIS — Z23 Encounter for immunization: Secondary | ICD-10-CM

## 2023-01-18 DIAGNOSIS — I272 Pulmonary hypertension, unspecified: Secondary | ICD-10-CM

## 2023-01-18 DIAGNOSIS — I5020 Unspecified systolic (congestive) heart failure: Secondary | ICD-10-CM | POA: Diagnosis not present

## 2023-01-18 NOTE — Assessment & Plan Note (Addendum)
Chronic. Uncontrolled. Patient off medications for 2 months: Coreg, Farxiga, Entresto, spironolactone, lasix, and Plavix. Educated to resume her medications and follow up with Cardiology for further advice if symptoms occur that warrant her to stop taking her medications. Labs ordered today. Follow up in 6 months. Call sooner if concerns arise.

## 2023-01-18 NOTE — Assessment & Plan Note (Signed)
Chronic. uncontrolled. Patient off medications for 2 months: Coreg, Farxiga, Entresto, spironolactone, lasix, and Plavix. Educated to resume her medications and follow up with Cardiology for further advice if symptoms occur that warrant her to stop taking her medications. Labs ordered today. Follow up in 6 months. Call sooner if concerns arise.

## 2023-01-18 NOTE — Progress Notes (Signed)
BP 137/72   Pulse 84   Temp 97.8 F (36.6 C) (Oral)   Ht 5' 5.75" (1.67 m)   Wt 159 lb 3.2 oz (72.2 kg)   SpO2 95%   BMI 25.89 kg/m    Subjective:    Patient ID: Veronica Romero, female    DOB: [birth year], 72 y.o.   MRN: [id number]  HPI: Veronica Romero is a 72 y.o. female  Chief Complaint  Patient presents with   Follow-up    HYPERTENSION / HYPERLIPIDEMIA/HEART FAILURE She is followed by HF clinic. Patient states she stopped taking her cardiac medications about two months ago due to diarrhea issues, states diarrhea resolved after she stopped taking certain medications but resumed her rheumatoid medications. She admits to dealing with an increased amount of stress lately after the loss of her husband. She is prescribed Coreg, Farxiga, Entresto, spironolactone, lasix and Plavix.  Satisfied with current treatment? No states caused her to have diarrhea Duration of hypertension: years BP monitoring frequency: occasionally,not daily  BP range: Around 140/60-70 BP medication side effects: yes Past BP meds:  Lasix,  Carvedilol, Entresto and Losartan Duration of hyperlipidemia: years Cholesterol medication side effects: no Cholesterol supplements: none Past cholesterol medications: rosuvastatin (crestor) Medication compliance: bad compliance Aspirin: Yes Recent stressors: Yes Recurrent headaches: No Visual changes: no Palpitations: no Dyspnea: no Chest pain: no Lower extremity edema: yes bilateral ankles Dizzy/lightheaded: no   RHEUMATOID Following up with Rheumatology. Taking Humira, methotrexate, Leucovorin and Predisone. She is taking Prednisone 5 mg daily instead of 10 mg daily now.   ALCOHOL USE Patient is drinking 2 glasses of wine 1-2 days per week.   Relevant past medical, surgical, family and social history reviewed and updated as indicated. Interim medical history since our last visit reviewed. Allergies and medications reviewed and updated.  Review of  Systems  Eyes:  Negative for visual disturbance.  Respiratory:  Negative for cough, chest tightness and shortness of breath.   Cardiovascular:  Positive for leg swelling. Negative for chest pain and palpitations.  Neurological:  Negative for dizziness, light-headedness and headaches.    Per HPI unless specifically indicated above     Objective:    BP 137/72   Pulse 84   Temp 97.8 F (36.6 C) (Oral)   Ht 5' 5.75" (1.67 m)   Wt 159 lb 3.2 oz (72.2 kg)   SpO2 95%   BMI 25.89 kg/m   Wt Readings from Last 3 Encounters:  01/18/23 159 lb 3.2 oz (72.2 kg)  10/22/22 164 lb (74.4 kg)  10/01/22 164 lb (74.4 kg)    Physical Exam Vitals and nursing note reviewed.  Constitutional:      General: She is awake. She is not in acute distress.    Appearance: Normal appearance. She is well-developed, well-groomed and normal weight. She is not ill-appearing, toxic-appearing or diaphoretic.  HENT:     Head: Normocephalic and atraumatic.     Right Ear: Hearing and external ear normal. No drainage.     Left Ear: Hearing and external ear normal. No drainage.     Nose: Nose normal.     Mouth/Throat:     Mouth: Mucous membranes are moist.     Pharynx: Oropharynx is clear.  Eyes:     General: Lids are normal.        Right eye: No discharge.        Left eye: No discharge.     Extraocular Movements: Extraocular movements intact.  Conjunctiva/sclera: Conjunctivae normal.     Pupils: Pupils are equal, round, and reactive to light.  Cardiovascular:     Rate and Rhythm: Normal rate and regular rhythm.     Pulses:          Radial pulses are 2+ on the right side and 2+ on the left side.       Posterior tibial pulses are 2+ on the right side and 2+ on the left side.     Heart sounds: S1 normal and S2 normal. Murmur heard.     No gallop.  Pulmonary:     Effort: Pulmonary effort is normal. No accessory muscle usage or respiratory distress.     Breath sounds: Normal breath sounds. No wheezing or  rales.  Musculoskeletal:        General: Deformity (due the rheumatoid on hands and feet) present. Normal range of motion.     Cervical back: Full passive range of motion without pain, normal range of motion and neck supple.     Right lower leg: 1+ Edema present.     Left lower leg: 1+ Edema present.  Skin:    General: Skin is warm and dry.     Capillary Refill: Capillary refill takes less than 2 seconds.  Neurological:     General: No focal deficit present.     Mental Status: She is alert and oriented to person, place, and time. Mental status is at baseline.  Psychiatric:        Attention and Perception: Attention normal.        Mood and Affect: Mood normal.        Speech: Speech normal.        Behavior: Behavior normal. Behavior is cooperative.        Thought Content: Thought content normal.        Judgment: Judgment normal.     Results for orders placed or performed in visit on 07/18/22  Comp Met (CMET)  Result Value Ref Range   Glucose 104 (H) 70 - 99 mg/dL   BUN 12 8 - 27 mg/dL   Creatinine, Ser 9.62 0.57 - 1.00 mg/dL   eGFR 71 >95 MW/UXL/2.44   BUN/Creatinine Ratio 14 12 - 28   Sodium 143 134 - 144 mmol/L   Potassium 3.9 3.5 - 5.2 mmol/L   Chloride 107 (H) 96 - 106 mmol/L   CO2 21 20 - 29 mmol/L   Calcium 9.7 8.7 - 10.3 mg/dL   Total Protein 7.4 6.0 - 8.5 g/dL   Albumin 4.2 3.8 - 4.8 g/dL   Globulin, Total 3.2 1.5 - 4.5 g/dL   Albumin/Globulin Ratio 1.3 1.2 - 2.2   Bilirubin Total 0.5 0.0 - 1.2 mg/dL   Alkaline Phosphatase 67 44 - 121 IU/L   AST 32 0 - 40 IU/L   ALT 18 0 - 32 IU/L  CBC w/Diff  Result Value Ref Range   WBC CANCELED x10E3/uL   RBC CANCELED x10E6/uL   Hemoglobin CANCELED g/dL   Hematocrit CANCELED %   MCV CANCELED fL   MCH CANCELED pg   MCHC CANCELED g/dL   RDW CANCELED %   Platelets CANCELED x10E3/uL   Neutrophils CANCELED %   Lymphs CANCELED %   Monocytes CANCELED %   Eos CANCELED %   Basos CANCELED %   Immature Cells CANCELED     Neutrophils Absolute CANCELED x10E3/uL   Lymphocytes Absolute CANCELED x10E3/uL   Monocytes Absolute CANCELED x10E3/uL   EOS (ABSOLUTE) CANCELED  x10E3/uL   Basophils Absolute CANCELED x10E3/uL   Immature Granulocytes CANCELED %   Immature Grans (Abs) CANCELED x10E3/uL   NRBC CANCELED %   Hematology Comments: CANCELED   Lipid Profile  Result Value Ref Range   Cholesterol, Total 158 100 - 199 mg/dL   Triglycerides 95 0 - 149 mg/dL   HDL 75 >16 mg/dL   VLDL Cholesterol Cal 17 5 - 40 mg/dL   LDL Chol Calc (NIH) 66 0 - 99 mg/dL   Chol/HDL Ratio 2.1 0.0 - 4.4 ratio      Assessment & Plan:   Problem List Items Addressed This Visit     HFrEF (heart failure with reduced ejection fraction) (HCC)    Chronic. Uncontrolled. Patient off medications for 2 months: Coreg, Farxiga, Entresto, spironolactone, lasix, and Plavix. Educated to resume her medications and follow up with Cardiology for further advice if symptoms occur that warrant her to stop taking her medications. Labs ordered today. Follow up in 6 months. Call sooner if concerns arise.       Relevant Orders   TSH   Lipid Panel w/o Chol/HDL Ratio   Mild pulmonary hypertension (HCC) - Primary    Chronic. uncontrolled. Patient off medications for 2 months: Coreg, Farxiga, Entresto, spironolactone, lasix, and Plavix. Educated to resume her medications and follow up with Cardiology for further advice if symptoms occur that warrant her to stop taking her medications. Labs ordered today. Follow up in 6 months. Call sooner if concerns arise.       Relevant Orders   Comp Met (CMET)   Lipid Panel w/o Chol/HDL Ratio   Other Visit Diagnoses     Need for influenza vaccination       Relevant Orders   Flu Vaccine Trivalent High Dose (Fluad) (Completed)   Need for COVID-19 vaccine       Relevant Orders   Pfizer Comirnaty Covid -19 Vaccine 56yrs and older (Completed)         Follow up plan: Return in about 3 months (around 04/19/2023) for  BP Recheck, Follow up.

## 2023-01-18 NOTE — Patient Instructions (Addendum)
Start back taking medications  If diarrhea starts get in to see cardiology for recommendations of symptoms associated with medications.   Do BRAT diet if diarrhea starts:  Eat bland, easy-to-digest foods in small amounts as you are able. These foods include: Bananas. Applesauce. Rice. Low-fat (lean) meats. Toast. Crackers.

## 2023-01-19 LAB — COMPREHENSIVE METABOLIC PANEL
ALT: 16 IU/L (ref 0–32)
AST: 26 IU/L (ref 0–40)
Albumin: 4.2 g/dL (ref 3.8–4.8)
Alkaline Phosphatase: 63 IU/L (ref 44–121)
BUN/Creatinine Ratio: 12 (ref 12–28)
BUN: 10 mg/dL (ref 8–27)
Bilirubin Total: 0.4 mg/dL (ref 0.0–1.2)
CO2: 24 mmol/L (ref 20–29)
Calcium: 9.7 mg/dL (ref 8.7–10.3)
Chloride: 104 mmol/L (ref 96–106)
Creatinine, Ser: 0.83 mg/dL (ref 0.57–1.00)
Globulin, Total: 3.1 g/dL (ref 1.5–4.5)
Glucose: 90 mg/dL (ref 70–99)
Potassium: 4.2 mmol/L (ref 3.5–5.2)
Sodium: 139 mmol/L (ref 134–144)
Total Protein: 7.3 g/dL (ref 6.0–8.5)
eGFR: 74 mL/min/{1.73_m2} (ref >59)

## 2023-01-19 LAB — LIPID PANEL W/O CHOL/HDL RATIO
Cholesterol, Total: 174 mg/dL (ref 100–199)
HDL: 67 mg/dL (ref >39)
LDL Chol Calc (NIH): 84 mg/dL (ref 0–99)
Triglycerides: 134 mg/dL (ref 0–149)
VLDL Cholesterol Cal: 23 mg/dL (ref 5–40)

## 2023-01-19 LAB — TSH: TSH: 1.82 u[IU]/mL (ref 0.450–4.500)

## 2023-01-20 ENCOUNTER — Encounter: Payer: Self-pay | Admitting: Cardiology

## 2023-01-21 NOTE — Telephone Encounter (Signed)
Pt has an appt 12/5 with you.

## 2023-01-21 NOTE — Progress Notes (Signed)
Hi Marce, your electrolytes, kidney function, liver function, TSH, and lipid panel are all stable and within normal limits. I recommend continue with the plan discussed at your previous visit. Thank you for allowing me to participate in your care.

## 2023-01-23 DIAGNOSIS — M059 Rheumatoid arthritis with rheumatoid factor, unspecified: Principal | ICD-10-CM

## 2023-01-23 DIAGNOSIS — M0579 Rheumatoid arthritis with rheumatoid factor of multiple sites without organ or systems involvement: Principal | ICD-10-CM

## 2023-01-23 DIAGNOSIS — M05742 Rheumatoid arthritis with rheumatoid factor of left hand without organ or systems involvement: Principal | ICD-10-CM

## 2023-01-23 DIAGNOSIS — M8589 Other specified disorders of bone density and structure, multiple sites: Principal | ICD-10-CM

## 2023-01-23 DIAGNOSIS — M05741 Rheumatoid arthritis with rheumatoid factor of right hand without organ or systems involvement: Principal | ICD-10-CM

## 2023-01-23 MED ORDER — METHOTREXATE SODIUM (CONTAINS PRESERVATIVES) 25 MG/ML INJECTION SOLUTION
SUBCUTANEOUS | 1 refills | 84 days | Status: CP
Start: 2023-01-23 — End: ?

## 2023-02-22 ENCOUNTER — Ambulatory Visit: Admit: 2023-02-22 | Discharge: 2023-02-22 | Payer: MEDICARE

## 2023-03-06 ENCOUNTER — Ambulatory Visit: Admit: 2023-03-06 | Discharge: 2023-03-07 | Payer: MEDICARE | Attending: Rheumatology | Primary: Rheumatology

## 2023-03-06 DIAGNOSIS — M05721 Rheumatoid arthritis with rheumatoid factor of right elbow without organ or systems involvement: Principal | ICD-10-CM

## 2023-03-06 DIAGNOSIS — M810 Age-related osteoporosis without current pathological fracture: Principal | ICD-10-CM

## 2023-03-06 DIAGNOSIS — M0579 Rheumatoid arthritis with rheumatoid factor of multiple sites without organ or systems involvement: Principal | ICD-10-CM

## 2023-03-06 DIAGNOSIS — Z79631 Methotrexate, long term, current use: Principal | ICD-10-CM

## 2023-03-06 DIAGNOSIS — M8589 Other specified disorders of bone density and structure, multiple sites: Principal | ICD-10-CM

## 2023-03-06 DIAGNOSIS — M059 Rheumatoid arthritis with rheumatoid factor, unspecified: Principal | ICD-10-CM

## 2023-03-06 DIAGNOSIS — M05742 Rheumatoid arthritis with rheumatoid factor of left hand without organ or systems involvement: Principal | ICD-10-CM

## 2023-03-06 DIAGNOSIS — M05722 Rheumatoid arthritis with rheumatoid factor of left elbow without organ or systems involvement: Principal | ICD-10-CM

## 2023-03-06 DIAGNOSIS — M05741 Rheumatoid arthritis with rheumatoid factor of right hand without organ or systems involvement: Principal | ICD-10-CM

## 2023-03-06 MED ORDER — LEUCOVORIN CALCIUM 5 MG TABLET
ORAL_TABLET | ORAL | 6 refills | 84 days | Status: CP
Start: 2023-03-06 — End: ?
  Filled 2023-06-03: qty 36, 84d supply, fill #0

## 2023-03-06 MED ORDER — HUMIRA PEN CITRATE FREE 40 MG/0.4 ML
SUBCUTANEOUS | 3 refills | 84 days | Status: CP
Start: 2023-03-06 — End: ?

## 2023-03-06 MED ORDER — PREDNISONE 5 MG TABLET
ORAL_TABLET | 6 refills | 0 days | Status: CP
Start: 2023-03-06 — End: ?

## 2023-03-06 MED ORDER — METHOTREXATE SODIUM (CONTAINS PRESERVATIVES) 25 MG/ML INJECTION SOLUTION
SUBCUTANEOUS | 1 refills | 84 days | Status: CP
Start: 2023-03-06 — End: ?

## 2023-03-06 MED ORDER — BD TUBERCULIN SYRINGE 1 ML 27 X 1/2"
3 refills | 350 days | Status: CP
Start: 2023-03-06 — End: ?
  Filled 2023-04-22: qty 12, 84d supply, fill #0

## 2023-03-18 DIAGNOSIS — M059 Rheumatoid arthritis with rheumatoid factor, unspecified: Principal | ICD-10-CM

## 2023-03-18 MED ORDER — TRAMADOL 50 MG TABLET
ORAL_TABLET | Freq: Three times a day (TID) | ORAL | 0 refills | 0 days | PRN
Start: 2023-03-18 — End: ?

## 2023-03-20 ENCOUNTER — Ambulatory Visit: Admit: 2023-03-20 | Discharge: 2023-03-21 | Payer: MEDICARE

## 2023-03-22 MED ORDER — TRAMADOL 50 MG TABLET
ORAL_TABLET | Freq: Three times a day (TID) | ORAL | 0 refills | 30 days | Status: CP | PRN
Start: 2023-03-22 — End: ?

## 2023-04-04 ENCOUNTER — Ambulatory Visit: Payer: Medicare Other | Attending: Cardiology | Admitting: Cardiology

## 2023-04-04 ENCOUNTER — Encounter: Payer: Self-pay | Admitting: Cardiology

## 2023-04-04 VITALS — BP 130/62 | HR 78 | Ht 67.0 in | Wt 162.6 lb

## 2023-04-04 DIAGNOSIS — E782 Mixed hyperlipidemia: Secondary | ICD-10-CM | POA: Insufficient documentation

## 2023-04-04 DIAGNOSIS — I2510 Atherosclerotic heart disease of native coronary artery without angina pectoris: Secondary | ICD-10-CM | POA: Insufficient documentation

## 2023-04-04 DIAGNOSIS — I255 Ischemic cardiomyopathy: Secondary | ICD-10-CM | POA: Diagnosis present

## 2023-04-04 DIAGNOSIS — I5020 Unspecified systolic (congestive) heart failure: Secondary | ICD-10-CM | POA: Insufficient documentation

## 2023-04-04 DIAGNOSIS — I272 Pulmonary hypertension, unspecified: Secondary | ICD-10-CM | POA: Diagnosis present

## 2023-04-04 DIAGNOSIS — R0609 Other forms of dyspnea: Secondary | ICD-10-CM | POA: Insufficient documentation

## 2023-04-04 DIAGNOSIS — Z79899 Other long term (current) drug therapy: Secondary | ICD-10-CM | POA: Insufficient documentation

## 2023-04-04 MED ORDER — FUROSEMIDE 40 MG PO TABS: 40.0000 mg | ORAL_TABLET | Freq: Every day | ORAL | 0 refills | Status: DC

## 2023-04-04 NOTE — Patient Instructions (Addendum)
Medication Instructions:  Your physician has recommended you make the following change in your medication:  For 5 days: Lasix 40 mg once daily *If you need a refill on your cardiac medications before your next appointment, please call your pharmacy*   Lab Work: CMET, Mag If you have labs (blood work) drawn today and your tests are completely normal, you will receive your results only by: MyChart Message (if you have MyChart) OR A paper copy in the mail If you have any lab test that is abnormal or we need to change your treatment, we will call you to review the results.   Testing/Procedures: Your physician has requested that you have an echocardiogram. Echocardiography is a painless test that uses sound waves to create images of your heart. It provides your doctor with information about the size and shape of your heart and how well your heart's chambers and valves are working. This procedure takes approximately one hour. There are no restrictions for this procedure. Please do NOT wear cologne, perfume, aftershave, or lotions (deodorant is allowed). Please arrive 15 minutes prior to your appointment time.  Please note: We ask at that you not bring children with you during ultrasound (echo/ vascular) testing. Due to room size and safety concerns, children are not allowed in the ultrasound rooms during exams. Our front office staff cannot provide observation of children in our lobby area while testing is being conducted. An adult accompanying a patient to their appointment will only be allowed in the ultrasound room at the discretion of the ultrasound technician under special circumstances. We apologize for any inconvenience.    Follow-Up: At [facility], you and your health needs are our priority.  As part of our continuing mission to provide you with exceptional heart care, we have created designated Provider Care Teams.  These Care Teams include your primary Cardiologist (physician)  and Advanced Practice Providers (APPs -  Physician Assistants and Nurse Practitioners) who all work together to provide you with the care you need, when you need it.   Your next appointment:   6 week(s) - overbook is fine   Provider:   Thomasene Ripple, DO

## 2023-04-05 LAB — COMPREHENSIVE METABOLIC PANEL
ALT: 16 [IU]/L (ref 0–32)
AST: 26 [IU]/L (ref 0–40)
Albumin: 4.1 g/dL (ref 3.8–4.8)
Alkaline Phosphatase: 82 [IU]/L (ref 44–121)
BUN/Creatinine Ratio: 13 (ref 12–28)
BUN: 19 mg/dL (ref 8–27)
Bilirubin Total: 0.3 mg/dL (ref 0.0–1.2)
CO2: 24 mmol/L (ref 20–29)
Calcium: 9.1 mg/dL (ref 8.7–10.3)
Chloride: 101 mmol/L (ref 96–106)
Creatinine, Ser: 1.41 mg/dL — ABNORMAL HIGH (ref 0.57–1.00)
Globulin, Total: 3.4 g/dL (ref 1.5–4.5)
Glucose: 89 mg/dL (ref 70–99)
Potassium: 3.8 mmol/L (ref 3.5–5.2)
Sodium: 140 mmol/L (ref 134–144)
Total Protein: 7.5 g/dL (ref 6.0–8.5)
eGFR: 39 mL/min/{1.73_m2} — ABNORMAL LOW (ref >59)

## 2023-04-05 LAB — MAGNESIUM: Magnesium: 2.2 mg/dL (ref 1.6–2.3)

## 2023-04-05 NOTE — Progress Notes (Signed)
Cardiology Office Note:    Date:  04/05/2023   ID:  Veronica Romero, DOB [birth year], MRN [id number]  PCP:  Veronica Grooms, NP  Cardiologist:  Veronica Ripple, DO  Electrophysiologist:  None   Referring Romero: Veronica Grooms, NP   " I am ok"   History of Present Illness:    Veronica Romero is a 72 y.o. female with a hx of  CAD, mild pulmonary hypertension, RA on methotrexate, prednisone, and Humira, and chronic systolic heart failure felt secondary to ischemic cardiomyopathy.  She was hospitalized February 2023 with COVID-19 pneumonia and acute hypoxic respiratory failure.  She required intubation.  Echocardiogram revealed a new newly reduced LVEF of 25-30% with wall motion abnormality and a mildly elevated PASP.Marland Kitchen  After she was stabilized, she underwent right and left heart catheterization which revealed 85% stenosis in the ostial-mid LAD and hemodynamic findings consistent with mild pulmonary hypertension.  She was discharged and brought back for scheduled staged OCT guided complex atherectomy to the ostial-mid LAD with DES 3.0 x 30 mm on 07/17/2021.  Residual 50% mid RCA and 50% distal LCx managed medically.  GDMT was titrated.  She had a repeat echocardiogram with improved LVEF to 40-45%, grade 1 DD, and ascending aorta measuring 37 mm.  Echocardiogram 04/10/2022 with LVEF still 45%.     She is maintained on [place] (insurance denied [place]), carvedilol, Entresto, and spironolactone.  I last saw the patient in 11/2021 at that time she was doing well.   She saw Veronica Romero on 10/01/2022 at that time she was stable. But discussed medication optimization.   presents with worsening shortness of breath and fatigue. She has been unable to sleep and has a persistent cough. She has been so fatigued that she has been unable to walk short distances. She has been sitting up to sleep and has not been laying flat. She has been taking half a pill of Lasix as prescribed.   Past Medical History:   Diagnosis Date   Anemia    CHF (congestive heart failure) (HCC)    Coronary artery disease    Diabetes mellitus without complication (HCC)    Hypertension    ILD (interstitial lung disease) (HCC)    Ischemic cardiomyopathy 06/12/2021   EF 25-30%   Pulmonary HTN (HCC)    Rheumatoid arthritis (HCC)     Past Surgical History:  Procedure Laterality Date   BREAST BIOPSY Left    ?when//benign   CORONARY ANGIOPLASTY     CORONARY ATHERECTOMY N/A 07/17/2021   Procedure: CORONARY ATHERECTOMY;  Surgeon: [place], Peter Romero, Romero;  Location: [facility] INVASIVE CV LAB;  Service: Cardiovascular;  Laterality: N/A;   RIGHT/LEFT HEART CATH AND CORONARY ANGIOGRAPHY N/A 06/16/2021   Procedure: RIGHT/LEFT HEART CATH AND CORONARY ANGIOGRAPHY;  Surgeon: Veronica Romero;  Location: [facility] INVASIVE CV LAB;  Service: Cardiovascular;  Laterality: N/A;    Current Medications: Current Meds  Medication Sig   Adalimumab (HUMIRA PEN) 40 MG/0.4ML PNKT Inject 0.4 mLs into the skin every Tuesday. Tuesday   aspirin EC 81 MG tablet Take 1 tablet (81 mg total) by mouth daily. Swallow whole.   carvedilol (COREG) 3.125 MG tablet Take 1 tablet (3.125 mg total) by mouth 2 (two) times daily with a meal.   Cholecalciferol (VITAMIN D3 PO) Take 1 tablet by mouth daily.   clopidogrel (PLAVIX) 75 MG tablet Take 1 tablet (75 mg total) by mouth daily.   dapagliflozin propanediol (FARXIGA) 10 MG TABS tablet Take 1 tablet (10 mg  total) by mouth daily before breakfast.   furosemide (LASIX) 40 MG tablet Take 1 tablet (40 mg total) by mouth daily. For 5 days   leucovorin (WELLCOVORIN) 5 MG tablet Take 15 mg by mouth every Saturday.   methotrexate 50 MG/2ML injection Inject 1 mL (25 mg total) into the muscle every Saturday. Hold methotrexate for 2 more weeks and then resume 3/10   Multiple Vitamin (MULTIVITAMIN WITH MINERALS) TABS tablet Take 1 tablet by mouth daily.   nitroGLYCERIN (NITROSTAT) 0.4 MG SL tablet Place 1 tablet (0.4 mg total)  under the tongue every 5 (five) minutes as needed.   Polyethyl Glycol-Propyl Glycol (LUBRICANT EYE DROPS) 0.4-0.3 % SOLN Place 1 drop into both eyes 3 (three) times daily as needed (dry/irritated eyes.).   predniSONE (DELTASONE) 5 MG tablet Take 10 mg by mouth in the morning.   rosuvastatin (CRESTOR) 40 MG tablet Take 1 tablet (40 mg total) by mouth daily.   sacubitril-valsartan (ENTRESTO) 24-26 MG Take 1 tablet by mouth 2 (two) times daily.   spironolactone (ALDACTONE) 25 MG tablet Take 0.5 tablets (12.5 mg total) by mouth daily. Take additional Tablet 12.5 mg for swelling   traMADol (ULTRAM) 50 MG tablet Take 50 mg by mouth every 6 (six) hours as needed (pain.).   [DISCONTINUED] furosemide (LASIX) 40 MG tablet Take 0.5 tablets (20 mg total) by mouth daily.     Allergies:   Penicillins   Social History   Socioeconomic History   Marital status: Widowed    Spouse name: Veronica Romero   Number of children: 1   Years of education: Not on file   Highest education level: Some college, no degree  Occupational History   Occupation: retired  Tobacco Use   Smoking status: Never   Smokeless tobacco: Never  Vaping Use   Vaping status: Never Used  Substance and Sexual Activity   Alcohol use: Yes    Alcohol/week: 2.0 standard drinks of alcohol    Types: 2 Glasses of wine per week   Drug use: Never   Sexual activity: Not Currently  Other Topics Concern   Not on file  Social History Narrative   Not on file   Social Determinants of Health   Financial Resource Strain: Low Risk  (10/22/2022)   Overall Financial Resource Strain (CARDIA)    Difficulty of Paying Living Expenses: Not very hard  Food Insecurity: No Food Insecurity (10/22/2022)   Hunger Vital Sign    Worried About Running Out of Food in the Last Year: Never true    Ran Out of Food in the Last Year: Never true  Transportation Needs: No Transportation Needs (10/22/2022)   PRAPARE - Administrator, Civil Service  (Medical): No    Lack of Transportation (Non-Medical): No  Physical Activity: Insufficiently Active (10/22/2022)   Exercise Vital Sign    Days of Exercise per Week: 2 days    Minutes of Exercise per Session: 20 min  Stress: No Stress Concern Present (10/22/2022)   [employer] of Occupational Health - Occupational Stress Questionnaire    Feeling of Stress : Not at all  Social Connections: Socially Isolated (10/22/2022)   Social Connection and Isolation Panel [NHANES]    Frequency of Communication with Friends and Family: More than three times a week    Frequency of Social Gatherings with Friends and Family: Not on file    Attends Religious Services: Never    Active Member of Clubs or Organizations: No    Attends [employer]  or Organization Meetings: Never    Marital Status: Widowed     Family History: The patient's family history includes Autoimmune disease in her daughter; Heart disease in her father; Kidney disease in her brother.  ROS:   Review of Systems  Constitution: Negative for decreased appetite, fever and weight gain.  HENT: Negative for congestion, ear discharge, hoarse voice and sore throat.   Eyes: Negative for discharge, redness, vision loss in right eye and visual halos.  Cardiovascular: Negative for chest pain, dyspnea on exertion, leg swelling, orthopnea and palpitations.  Respiratory: Negative for cough, hemoptysis, shortness of breath and snoring.   Endocrine: Negative for heat intolerance and polyphagia.  Hematologic/Lymphatic: Negative for bleeding problem. Does not bruise/bleed easily.  Skin: Negative for flushing, nail changes, rash and suspicious lesions.  Musculoskeletal: Negative for arthritis, joint pain, muscle cramps, myalgias, neck pain and stiffness.  Gastrointestinal: Negative for abdominal pain, bowel incontinence, diarrhea and excessive appetite.  Genitourinary: Negative for decreased libido, genital sores and incomplete emptying.  Neurological:  Negative for brief paralysis, focal weakness, headaches and loss of balance.  Psychiatric/Behavioral: Negative for altered mental status, depression and suicidal ideas.  Allergic/Immunologic: Negative for HIV exposure and persistent infections.    EKGs/Labs/Other Studies Reviewed:    The following studies were reviewed today:   EKG:  The ekg ordered today demonstrates   Recent Labs: 07/18/2022: Hemoglobin CANCELED; Platelets CANCELED 01/18/2023: TSH 1.820 04/04/2023: ALT 16; BUN 19; Creatinine, Ser 1.41; Magnesium 2.2; Potassium 3.8; Sodium 140  Recent Lipid Panel    Component Value Date/Time   CHOL 174 01/18/2023 1116   TRIG 134 01/18/2023 1116   HDL 67 01/18/2023 1116   CHOLHDL 2.1 07/18/2022 0954   CHOLHDL 4.7 06/16/2021 0208   VLDL 23 06/16/2021 0208   LDLCALC 84 01/18/2023 1116    Physical Exam:    VS:  BP 130/62 (BP Location: Left Arm, Patient Position: Sitting, Cuff Size: Normal)   Pulse 78   Ht 5\' 7"  (1.702 Romero)   Wt 162 lb 9.6 oz (73.8 kg)   SpO2 99%   BMI 25.47 kg/Romero     Wt Readings from Last 3 Encounters:  04/04/23 162 lb 9.6 oz (73.8 kg)  01/18/23 159 lb 3.2 oz (72.2 kg)  10/22/22 164 lb (74.4 kg)     GEN: Well nourished, well developed in no acute distress HEENT: Normal NECK: No JVD; No carotid bruits LYMPHATICS: No lymphadenopathy CARDIAC: S1S2 noted,RRR, no murmurs, rubs, gallops RESPIRATORY:  Clear to auscultation without rales, wheezing or rhonchi  ABDOMEN: Soft, non-tender, non-distended, +bowel sounds, no guarding. EXTREMITIES: No edema, No cyanosis, no clubbing MUSCULOSKELETAL:  No deformity  SKIN: Warm and dry NEUROLOGIC:  Alert and oriented x 3, non-focal PSYCHIATRIC:  Normal affect, good insight  ASSESSMENT:    1. DOE (dyspnea on exertion)   2. Medication management   3. Coronary artery disease involving native coronary artery of native heart without angina pectoris   4. HFrEF (heart failure with reduced ejection fraction) (HCC)   5.  Ischemic cardiomyopathy   6. Mild pulmonary hypertension (HCC)   7. Mixed hyperlipidemia    PLAN:    Recent episodes of fatigue, dyspnea on exertion, and cough suggestive of fluid overload. Blood pressure elevated. Last echocardiogram a year ago showed an ejection fraction of 45%. Increase Lasix to a whole pill daily for the next five days. Order repeat echocardiogram to re-assess current ejection fraction. Check kidney function today prior to increasing Lasix dose. Follow-up appointment in six weeks to  assess response to increased Lasix and discuss echocardiogram results.  Insomnia Chronic issue, impacting overall health and potentially exacerbating heart condition. -Recommend discussion with primary care provider regarding potential prescription sleep aids.  CAD - no angina symptoms  General Health Maintenance -Encourage use of MyChart for communication of any changes in health status.  The patient is in agreement with the above plan. The patient left the office in stable condition.  The patient will follow up in   Medication Adjustments/Labs and Tests Ordered: Current medicines are reviewed at length with the patient today.  Concerns regarding medicines are outlined above.  Orders Placed This Encounter  Procedures   Comprehensive Metabolic Panel (CMET)   Magnesium   ECHOCARDIOGRAM COMPLETE   Meds ordered this encounter  Medications   furosemide (LASIX) 40 MG tablet    Sig: Take 1 tablet (40 mg total) by mouth daily. For 5 days    Dispense:  5 tablet    Refill:  0    Patient Instructions  Medication Instructions:  Your physician has recommended you make the following change in your medication:  For 5 days: Lasix 40 mg once daily *If you need a refill on your cardiac medications before your next appointment, please call your pharmacy*   Lab Work: CMET, Mag If you have labs (blood work) drawn today and your tests are completely normal, you will receive your results  only by: MyChart Message (if you have MyChart) OR A paper copy in the mail If you have any lab test that is abnormal or we need to change your treatment, we will call you to review the results.   Testing/Procedures: Your physician has requested that you have an echocardiogram. Echocardiography is a painless test that uses sound waves to create images of your heart. It provides your doctor with information about the size and shape of your heart and how well your heart's chambers and valves are working. This procedure takes approximately one hour. There are no restrictions for this procedure. Please do NOT wear cologne, perfume, aftershave, or lotions (deodorant is allowed). Please arrive 15 minutes prior to your appointment time.  Please note: We ask at that you not bring children with you during ultrasound (echo/ vascular) testing. Due to room size and safety concerns, children are not allowed in the ultrasound rooms during exams. Our front office staff cannot provide observation of children in our lobby area while testing is being conducted. An adult accompanying a patient to their appointment will only be allowed in the ultrasound room at the discretion of the ultrasound technician under special circumstances. We apologize for any inconvenience.    Follow-Up: At [facility], you and your health needs are our priority.  As part of our continuing mission to provide you with exceptional heart care, we have created designated Provider Care Teams.  These Care Teams include your primary Cardiologist (physician) and Advanced Practice Providers (APPs -  Physician Assistants and Nurse Practitioners) who all work together to provide you with the care you need, when you need it.   Your next appointment:   6 week(s) - overbook is fine   Provider:   Thomasene Ripple, DO      Adopting a Healthy Lifestyle.  Know what a healthy weight is for you (roughly BMI <25) and aim to maintain this   Aim  for 7+ servings of fruits and vegetables daily   65-80+ fluid ounces of water or unsweet tea for healthy kidneys   Limit to max 1  drink of alcohol per day; avoid smoking/tobacco   Limit animal fats in diet for cholesterol and heart health - choose grass fed whenever available   Avoid highly processed foods, and foods high in saturated/trans fats   Aim for low stress - take time to unwind and care for your mental health   Aim for 150 min of moderate intensity exercise weekly for heart health, and weights twice weekly for bone health   Aim for 7-9 hours of sleep daily   When it comes to diets, agreement about the perfect plan isnt easy to find, even among the experts. Experts at the [facility] of [employer] developed an idea known as the Healthy Eating Plate. Just imagine a plate divided into logical, healthy portions.   The emphasis is on diet quality:   Load up on vegetables and fruits - one-half of your plate: Aim for color and variety, and remember that potatoes dont count.   Go for whole grains - one-quarter of your plate: Whole wheat, barley, wheat berries, quinoa, oats, brown rice, and foods made with them. If you want pasta, go with whole wheat pasta.   Protein power - one-quarter of your plate: Fish, chicken, beans, and nuts are all healthy, versatile protein sources. Limit red meat.   The diet, however, does go beyond the plate, offering a few other suggestions.   Use healthy plant oils, such as olive, canola, soy, corn, sunflower and peanut. Check the labels, and avoid partially hydrogenated oil, which have unhealthy trans fats.   If youre thirsty, drink water. Coffee and tea are good in moderation, but skip sugary drinks and limit milk and dairy products to one or two daily servings.   The type of carbohydrate in the diet is more important than the amount. Some sources of carbohydrates, such as vegetables, fruits, whole grains, and beans-are healthier than  others.   Finally, stay active  Signed, Veronica Ripple, DO  04/05/2023 8:11 PM    [facility]

## 2023-04-09 ENCOUNTER — Encounter: Payer: Self-pay | Admitting: Cardiology

## 2023-04-09 NOTE — Telephone Encounter (Signed)
Patient identification verified by 2 forms. Marilynn Rail, RN    Called and spoke to patient  Patient states:   -she has taken Entresto daily but will be out of it by next week   -does not recall where instructions are for placing mail order  Informed patient:   -Per chart review was approved for [employer] patient assistance until 04/30/23  -Can contact [phone] for assistance with mail order request   -she will need to reapply for the program  -RN will send mychart message with link to application (patient confirm has access to printer)   -once application complete, bring to office for provider to complete  Patient agrees with plan, no questions at this time    Mychart message sent

## 2023-04-16 ENCOUNTER — Other Ambulatory Visit: Payer: Self-pay

## 2023-04-19 ENCOUNTER — Ambulatory Visit: Payer: Medicare Other | Admitting: Nurse Practitioner

## 2023-04-30 NOTE — Unmapped (Signed)
[facility] Specialty Medication Onboarding    Specialty Medication: methotrexate (CONTAINS PRESERVATIVES) 25 mg/mL injection solution  Prior Authorization: Not Required   Financial Assistance: No - copay  <$25  Final Copay/Day Supply: $1.55 / 69    Insurance Restrictions: None     Notes to Pharmacist:   Credit Card on File: yes    The triage team has completed the benefits investigation and has determined that the patient is able to fill this medication at [facility]. Please contact the patient to complete the onboarding or follow up with the prescribing physician as needed.

## 2023-04-30 NOTE — Unmapped (Signed)
[facility]  Specialty Lite Counseling    [facility] is a 72 y.o. female with Seropositive Rheumatoid Arthritis who I am counseling today on initiation of therapy.  I am speaking to the patient.    Was a Nurse, learning disability used for this call? No    Verified patient's date of birth / HIPAA.    Specialty Lite medication(s) to be sent: methotrexate      Non-specialty medications/supplies to be sent: None      Medications not needed at this time: N/a         An offer to provide counseling to the patient regarding their medication was made. The patient accepted counseling and was counseling on the following only: medication administration, missed dose instructions, goals of therapy, side effects and monitoring parameters, warnings and precautions, drug/food interactions, and storage, handling precautions, and disposal.      Current Medications (including OTC/herbals), Comorbidities and Allergies     Current Outpatient Medications   Medication Sig Dispense Refill    aspirin (ECOTRIN) 81 MG tablet Take 1 tablet (81 mg total) by mouth.      calcium carbonate (OS-CAL) 500 mg calcium (1,250 mg) chewable tablet Chew 1 tablet (500 mg elem calcium total) daily.      carvediloL (COREG) 3.125 MG tablet Take 1 tablet (3.125 mg total) by mouth.      cholecalciferol, vitamin D3, 100 mcg (4,000 unit) cap Take by mouth.      clopidogreL (PLAVIX) 75 mg tablet Take 1 tablet (75 mg total) by mouth daily.      diclofenac sodium (VOLTAREN) 1 % gel Apply 2 g topically four (4) times a day. 100 g 0    ENTRESTO 24-26 mg tablet Take 1 mg by mouth Two (2) times a day (at 8am and 12:00).      furosemide (LASIX) 40 MG tablet Take 1 tablet (40 mg total) by mouth.      HUMIRA PEN CITRATE FREE 40 MG/0.4 ML Inject the contents of 1 pen (40 mg total) under the skin every seven (7) days. 12 each 3    leuCOVorin (WELLCOVORIN) 5 mg tablet Take 3 tablets (15 mg total) by mouth once a week. 36 tablet 6    losartan (COZAAR) 25 MG tablet methotrexate sodium (METHOTREXATE, CONTAINS PRESERVATIVES,) 25 mg/mL injection solution Inject 1 mL (25 mg total) under the skin once a week. 12 mL 1    multivitamin,tx-iron-minerals Tab Take 1 tablet by mouth daily.      nitroglycerin (NITROSTAT) 0.4 MG SL tablet Place 1 tablet (0.4 mg total) under the tongue.      omeprazole 20 mg tablet Take 1 tablet (20 mg total) by mouth daily.      potassium chloride 20 MEQ ER tablet Take 1 tablet (20 mEq total) by mouth daily.      predniSONE (DELTASONE) 5 MG tablet Take 1 tablet (5 mg) by mouth every other day alternative with 1/2 tablet (2.5 mg) every other day 30 tablet 6    spironolactone (ALDACTONE) 25 MG tablet Take 0.5 tablets (12.5 mg total) by mouth.      syringe (BD TUBERCULIN SYRINGE) 1 mL 27 x 1/2 Syrg Use one syringe as directed  once a week. 50 each 3    traMADol (ULTRAM) 50 mg tablet TAKE 1 TABLET BY MOUTH EVERY EIGHT HOURS AS NEEDED FOR PAIN. 90 tablet 0     No current facility-administered medications for this visit.       Allergies  Allergen Reactions    Penicillins      Other reaction(s): RASH       Patient Active Problem List   Diagnosis    Hypertension, benign    Primary localized osteoarthrosis, ankle and foot    Seropositive rheumatoid arthritis of multiple sites (CMS-HCC)    RA (rheumatoid arthritis) (CMS-HCC)    Stasis dermatitis    DJD (degenerative joint disease), ankle and foot    Depression with anxiety    Long-term use of high-risk medication    Osteopenia of multiple sites    Pain and swelling of right knee    Osteoporosis       Reviewed and up to date in Epic.    Appropriateness of Therapy     Prescription has been clinically reviewed: Yes    Financial Information     Medication Assistance provided: None Required    Anticipated copay of $1.55 / 84 reviewed with patient.     Patient Specific Needs     Does the patient have any physical, cognitive, or cultural barriers? No    Does the patient have adequate living arrangements? (i.e. the ability to store and take their medication appropriately) Yes    Did you identify any home environmental safety or security hazards? No    Patient prefers to have medications discussed with  Patient     Is the patient or caregiver able to read and understand education materials at a high school level or above? Yes    Patient's primary language is  English     Is the patient high risk? No    SOCIAL DETERMINANTS OF HEALTH     At the [facility], we have learned that life circumstances - like trouble affording food, housing, utilities, or transportation can affect the health of many of our patients.   That is why we wanted to ask: are you currently experiencing any life circumstances that are negatively impacting your health and/or quality of life? Patient declined to answer    Social Drivers of Health     Food Insecurity: No Food Insecurity (10/22/2022)    Received from [facility]    Hunger Vital Sign     Worried About Running Out of Food in the Last Year: Never true     Ran Out of Food in the Last Year: Never true   Internet Connectivity: Not on file   Housing/Utilities: Not on file   Tobacco Use: Medium Risk (03/06/2023)    Patient History     Smoking Tobacco Use: Former     Smokeless Tobacco Use: Never     Passive Exposure: Not on file   Transportation Needs: No Transportation Needs (10/22/2022)    Received from [facility] - Transportation     Lack of Transportation (Medical): No     Lack of Transportation (Non-Medical): No   Alcohol Use: Not on file   Interpersonal Safety: Not on file   Physical Activity: Insufficiently Active (10/22/2022)    Received from [facility]    Exercise Vital Sign     Days of Exercise per Week: 2 days     Minutes of Exercise per Session: 20 min   Intimate Partner Violence: Not At Risk (10/22/2022)    Received from [facility]    Humiliation, Afraid, Rape, and Kick questionnaire     Fear of Current or Ex-Partner: No     Emotionally Abused: No     Physically Abused: No Sexually Abused:  No   Stress: No Stress Concern Present (10/22/2022)    Received from [facility] - Occupational Stress Questionnaire     Feeling of Stress : Not at all   Substance Use: Not on file (03/05/2023)   Social Connections: Socially Isolated (10/22/2022)    Received from [facility]    Social Connection and Isolation Panel [NHANES]     Frequency of Communication with Friends and Family: More than three times a week     Frequency of Social Gatherings with Friends and Family: Not on file     Attends Religious Services: Never     Database administrator or Organizations: No     Attends Banker Meetings: Never     Marital Status: Widowed   Physicist, medical Strain: Low Risk  (10/22/2022)    Received from [facility]    Overall Financial Resource Strain (CARDIA)     Difficulty of Paying Living Expenses: Not very hard   Depression: Not on file   Health Literacy: Not on file       Would you be willing to receive help with any of the needs that you have identified today? Not applicable    Delivery Information     Verified delivery address.    Scheduled delivery date: 05/03/2023    Expected start date: 05/03/2023    Medication will be delivered via UPS to the prescription address in [facility].  This shipment will not require a signature.      Explained the services we provide at [facility] and that each month we will send text messages and/or mychart messages to set up refills. Informed patient that refills should be scheduled 7-10 days prior to when they will run out of medication. Informed patient that a welcome packet, containing information about our pharmacy and other support services, a Notice of Privacy Practices, and a drug information handout will be sent.      The patient or caregiver noted above participated in the development of this care plan and knows that they can request review of or adjustments to the care plan at any time.      Patient or caregiver verbalized understanding of the above information as well as how to contact the pharmacy at [phone] option 4 with any questions/concerns.  The pharmacy is open Monday through Friday 8:30am-4:30pm.  A pharmacist is available 24/7 via pager to answer any clinical questions they may have.      Elnora Morrison, PharmD  [facility] Specialty Pharmacist

## 2023-05-02 MED FILL — METHOTREXATE SODIUM (CONTAINS PRESERVATIVES) 25 MG/ML INJECTION SOLUTION: SUBCUTANEOUS | 84 days supply | Qty: 12 | Fill #0

## 2023-05-07 ENCOUNTER — Ambulatory Visit (HOSPITAL_COMMUNITY)
Admission: RE | Admit: 2023-05-07 | Discharge: 2023-05-07 | Disposition: A | Discharge status: Home / Self Care | Payer: Medicare Other | Source: Ambulatory Visit | Attending: Cardiology | Admitting: Cardiology

## 2023-05-07 DIAGNOSIS — R0609 Other forms of dyspnea: Secondary | ICD-10-CM | POA: Insufficient documentation

## 2023-05-07 LAB — ECHOCARDIOGRAM COMPLETE
AR max vel: 2.9 cm2
AV Area VTI: 2.97 cm2
AV Area mean vel: 2.76 cm2
AV Mean grad: 4 mm[Hg]
AV Peak grad: 7.8 mm[Hg]
Ao pk vel: 1.4 m/s
Area-P 1/2: 5.27 cm2
Calc EF: 50 %
S' Lateral: 3.62 cm
Single Plane A2C EF: 45.4 %
Single Plane A4C EF: 52.1 %

## 2023-05-15 ENCOUNTER — Ambulatory Visit: Payer: Medicare Other | Attending: Cardiology | Admitting: Cardiology

## 2023-05-15 ENCOUNTER — Encounter: Payer: Self-pay | Admitting: Cardiology

## 2023-05-15 VITALS — BP 118/54 | HR 70 | Ht 67.0 in | Wt 163.0 lb

## 2023-05-15 DIAGNOSIS — I5020 Unspecified systolic (congestive) heart failure: Secondary | ICD-10-CM | POA: Diagnosis present

## 2023-05-15 DIAGNOSIS — I2510 Atherosclerotic heart disease of native coronary artery without angina pectoris: Secondary | ICD-10-CM | POA: Insufficient documentation

## 2023-05-15 DIAGNOSIS — Z9861 Coronary angioplasty status: Secondary | ICD-10-CM | POA: Diagnosis present

## 2023-05-15 DIAGNOSIS — Z79899 Other long term (current) drug therapy: Secondary | ICD-10-CM | POA: Insufficient documentation

## 2023-05-15 DIAGNOSIS — I351 Nonrheumatic aortic (valve) insufficiency: Secondary | ICD-10-CM | POA: Diagnosis present

## 2023-05-15 DIAGNOSIS — I272 Pulmonary hypertension, unspecified: Secondary | ICD-10-CM | POA: Insufficient documentation

## 2023-05-15 DIAGNOSIS — R0609 Other forms of dyspnea: Secondary | ICD-10-CM | POA: Insufficient documentation

## 2023-05-15 DIAGNOSIS — E782 Mixed hyperlipidemia: Secondary | ICD-10-CM | POA: Diagnosis present

## 2023-05-15 LAB — COMPREHENSIVE METABOLIC PANEL
ALT: 14 [IU]/L (ref 0–32)
AST: 25 [IU]/L (ref 0–40)
Albumin: 4.4 g/dL (ref 3.8–4.8)
Alkaline Phosphatase: 65 [IU]/L (ref 44–121)
BUN/Creatinine Ratio: 21 (ref 12–28)
BUN: 21 mg/dL (ref 8–27)
Bilirubin Total: 0.3 mg/dL (ref 0.0–1.2)
CO2: 22 mmol/L (ref 20–29)
Calcium: 9.4 mg/dL (ref 8.7–10.3)
Chloride: 102 mmol/L (ref 96–106)
Creatinine, Ser: 0.99 mg/dL (ref 0.57–1.00)
Globulin, Total: 3.5 g/dL (ref 1.5–4.5)
Glucose: 103 mg/dL — ABNORMAL HIGH (ref 70–99)
Potassium: 4.6 mmol/L (ref 3.5–5.2)
Sodium: 139 mmol/L (ref 134–144)
Total Protein: 7.9 g/dL (ref 6.0–8.5)
eGFR: 60 mL/min/{1.73_m2} (ref >59)

## 2023-05-15 LAB — MAGNESIUM: Magnesium: 2.3 mg/dL (ref 1.6–2.3)

## 2023-05-15 MED ORDER — FUROSEMIDE 40 MG PO TABS: 40.0000 mg | ORAL_TABLET | ORAL | 3 refills | Status: DC

## 2023-05-15 NOTE — Patient Instructions (Signed)
 Medication Instructions:  Your physician has recommended you make the following change in your medication:  CHANGE: Lasix  40 mg once weekly *If you need a refill on your cardiac medications before your next appointment, please call your pharmacy*   Lab Work: CMET, Mag If you have labs (blood work) drawn today and your tests are completely normal, you will receive your results only by: MyChart Message (if you have MyChart) OR A paper copy in the mail If you have any lab test that is abnormal or we need to change your treatment, we will call you to review the results.   Follow-Up: At [facility], you and your health needs are our priority.  As part of our continuing mission to provide you with exceptional heart care, we have created designated Provider Care Teams.  These Care Teams include your primary Cardiologist (physician) and Advanced Practice Providers (APPs -  Physician Assistants and Nurse Practitioners) who all work together to provide you with the care you need, when you need it.     Your next appointment:   6 month(s)  Provider:   Kardie Tobb, DO     Other Instructions

## 2023-05-16 IMAGING — CT CT CHEST W/O CM
2 of 4 series · 15 of 36 positions shown, 18 images · non-contrast
Comparison: CT chest 06/11/2021

CLINICAL DATA: Complicated pneumonia follow-up



[Series 3: chest wo · axial · 0.59mm/px · z∈[-692,-446]mm · 12 of 147 slices shown, 15 images]
[im 12/147  mediastinal]
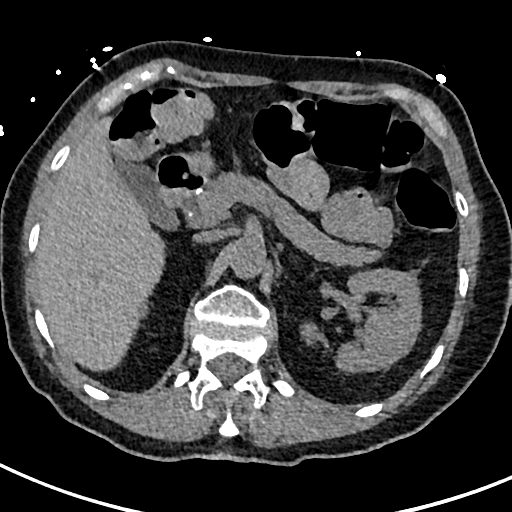
[im 12/147  lung]
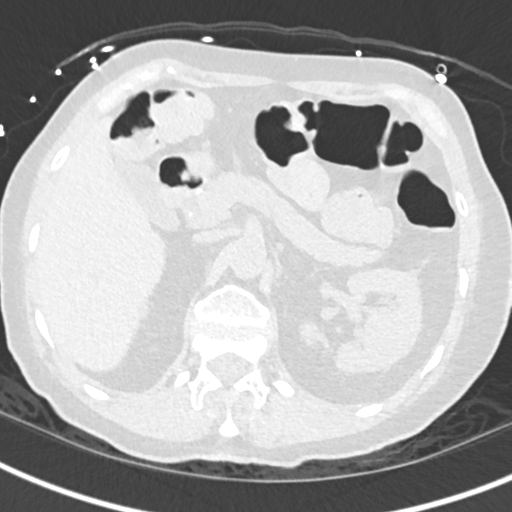
[im 23/147  lung]
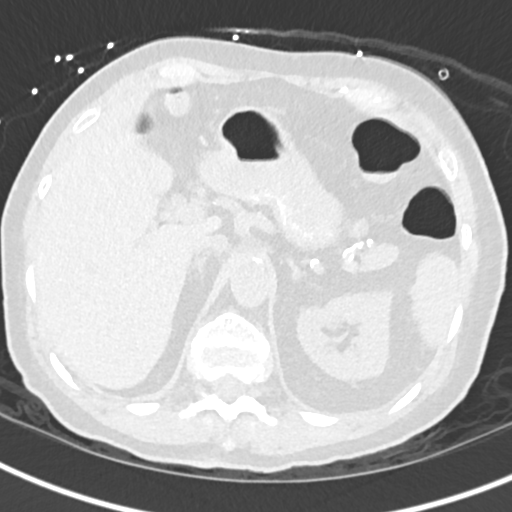
[im 34/147  lung]
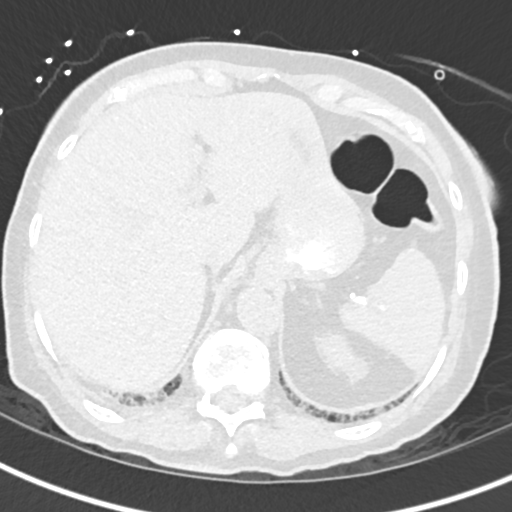
[im 45/147  lung]
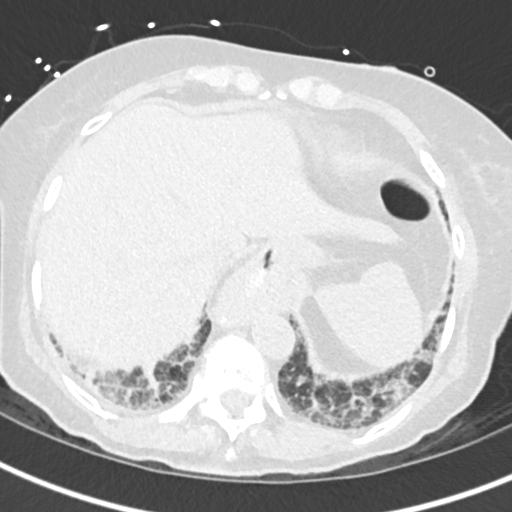
[im 57/147  mediastinal]
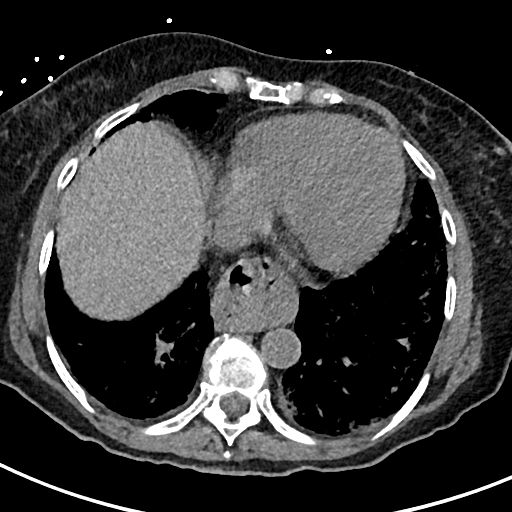
[im 57/147  lung]
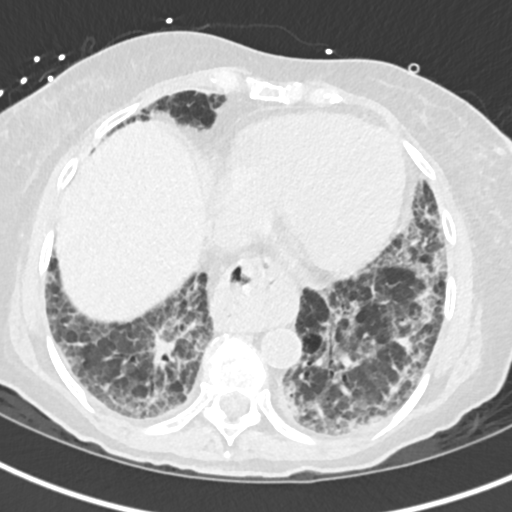
[im 68/147  lung]
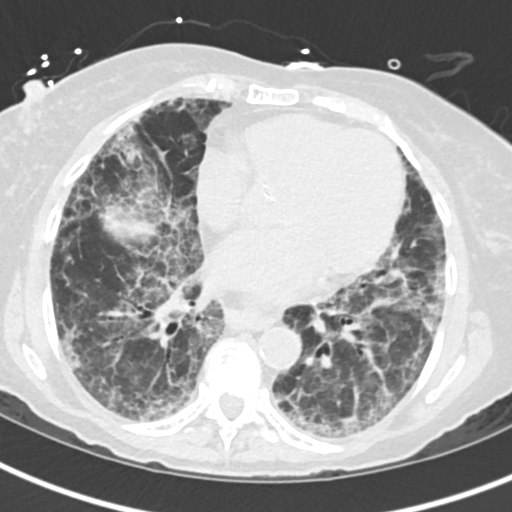
[im 79/147  lung]
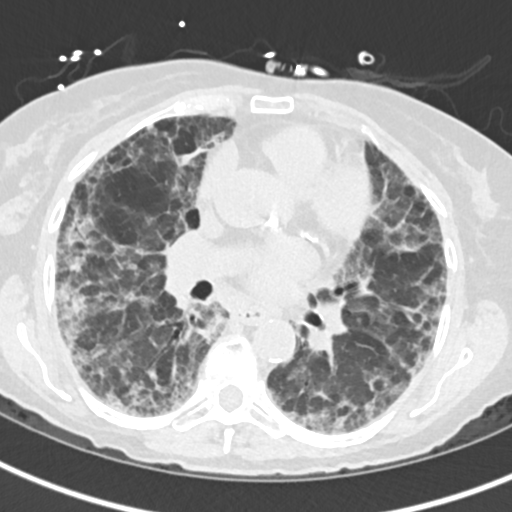
[im 90/147  lung]
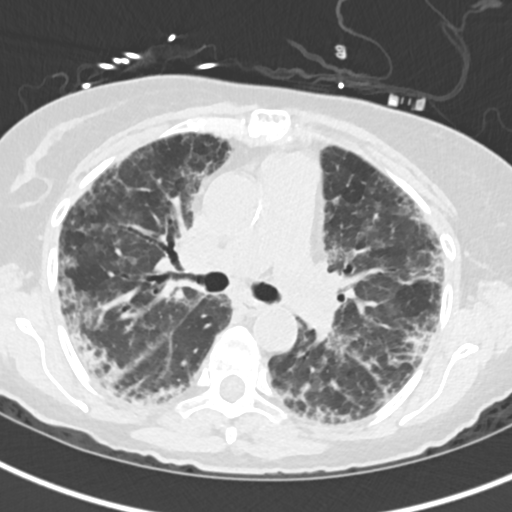
[im 102/147  mediastinal]
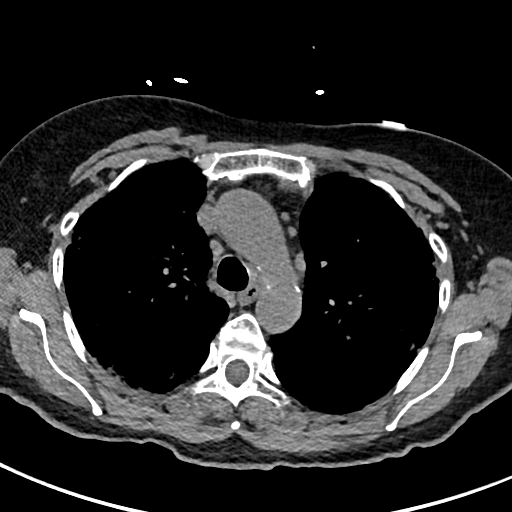
[im 102/147  lung]
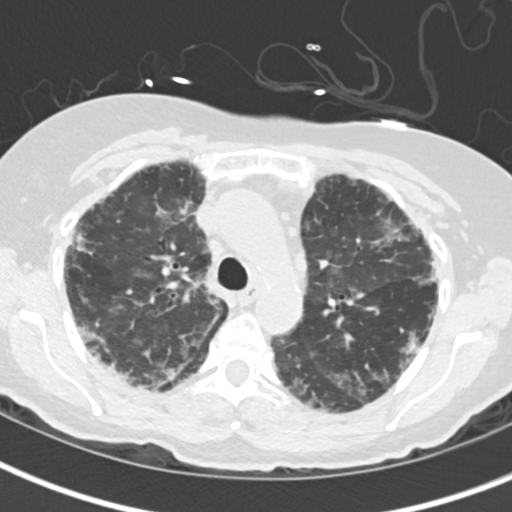
[im 113/147  lung]
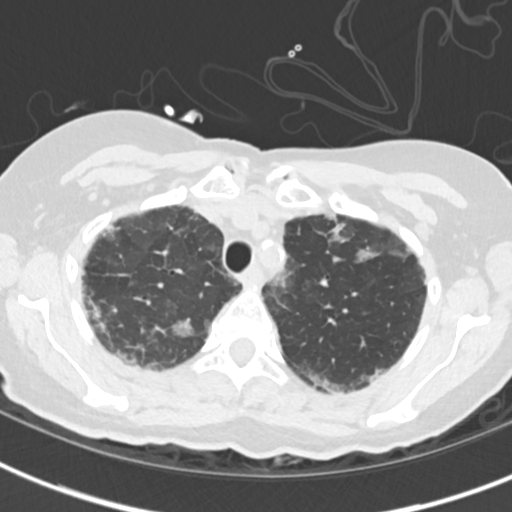
[im 124/147  lung]
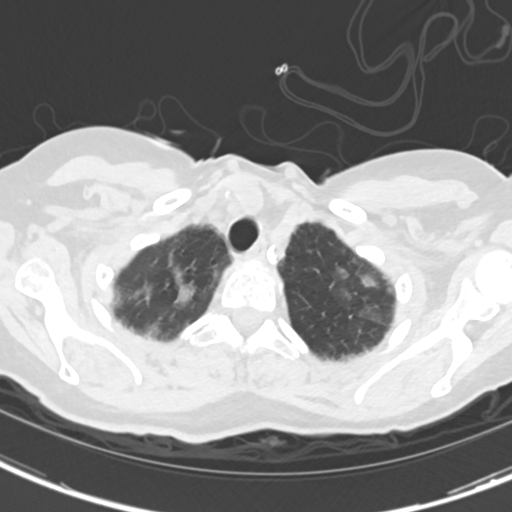
[im 135/147  lung]
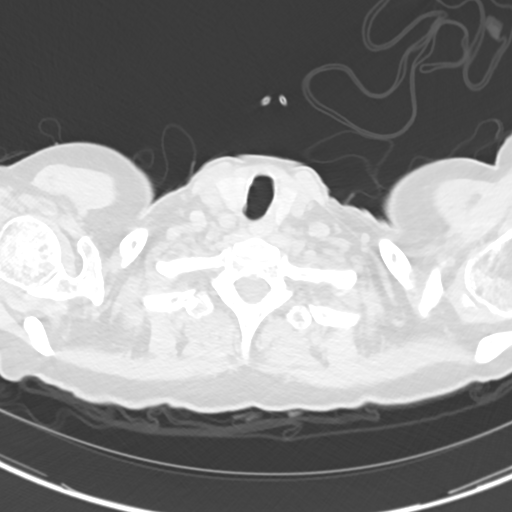

[Series 6: cor · coronal · 0.59mm/px · 3 of 123 slices shown]
[im 25/123  lung]
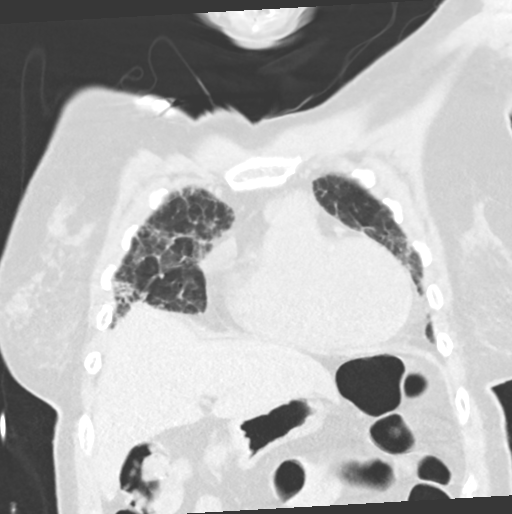
[im 49/123  lung]
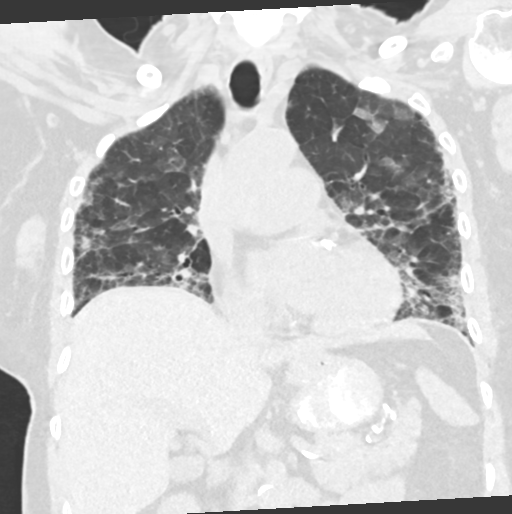
[im 74/123  lung]
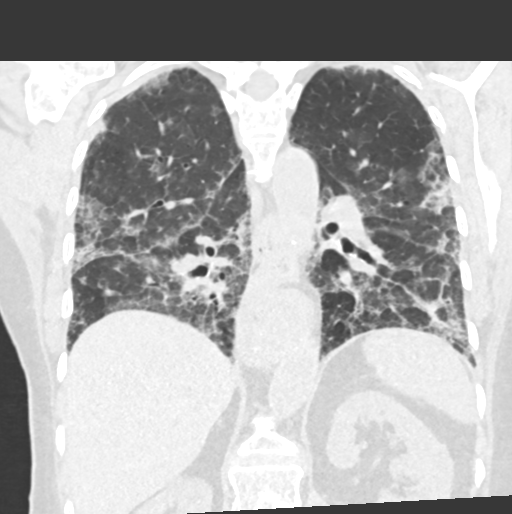

[15 of 36 positions shown; findings below may reference images not displayed]

FINDINGS: Cardiovascular: Heart is mildly enlarged. No pericardial effusion
identified. Extensive coronary artery calcifications. Main pulmonary
artery is upper normal caliber. Thoracic aorta is normal in caliber
with moderate calcified plaques.

Mediastinum/Nodes: No bulky axillary or mediastinal lymphadenopathy
identified. Likely prominent bilateral hilar lymph nodes, limited
evaluation without contrast.

Lungs/Pleura: Persistent extensive increased interstitial densities
and irregular ground-glass alveolar densities throughout both lungs.
The ground-glass infiltrates are decreased in size and density since
previous study with overall improved aeration of the lungs. No
pleural effusion. No pneumothorax.

Upper Abdomen: Moderate-sized hiatal hernia.

Musculoskeletal: Degenerative changes of the thoracic spine and old
compression fracture in the lower thoracic spine. No suspicious bony
lesions identified.
IMPRESSION: 1. Improvement since previous study, however persistent extensive
interstitial and irregular ground-glass infiltrates bilaterally.
2. Mild cardiomegaly. Coronary artery disease and atherosclerotic
disease.
3. Moderate hiatal hernia.

Aortic Atherosclerosis ([id number]).

## 2023-05-19 NOTE — Progress Notes (Signed)
Cardiology Office Note:    Date:  05/19/2023   ID:  Veronica Romero, DOB 03/16/1951, MRN [id number]  PCP:  Larae Grooms, NP  Cardiologist:  Thomasene Ripple, DO  Electrophysiologist:  None   Referring MD: Larae Grooms, NP   " I am ok"   History of Present Illness:    Veronica Romero is a 73 y.o. female with a hx of  CAD, mild pulmonary hypertension, RA on methotrexate, prednisone, and Humira, and chronic systolic heart failure felt secondary to ischemic cardiomyopathy.  She was hospitalized February 2023 with COVID-19 pneumonia and acute hypoxic respiratory failure.  She required intubation.  Echocardiogram revealed a new newly reduced LVEF of 25-30% with wall motion abnormality and a mildly elevated PASP.Marland Kitchen  After she was stabilized, she underwent right and left heart catheterization which revealed 85% stenosis in the ostial-mid LAD and hemodynamic findings consistent with mild pulmonary hypertension.  She was discharged and brought back for scheduled staged OCT guided complex atherectomy to the ostial-mid LAD with DES 3.0 x 30 mm on 07/17/2021.  Residual 50% mid RCA and 50% distal LCx managed medically.  GDMT was titrated.  She had a repeat echocardiogram with improved LVEF to 40-45%, grade 1 DD, and ascending aorta measuring 37 mm.  Echocardiogram 04/10/2022 with LVEF still 45%.      Her last visit with me was on 04/04/2023 she was experiencing shortness of breath. I had the patient adjust her Lasix dose and ordered an echo.   Today she tells me that has had some improvement in her shortness of breath.  She reports  she has been feeling fatigued, which she attributes to a lack of physical activity. The patient's heart function is reported to be normal, but the aortic valve is leaking more than previously. The patient's blood pressure remains low, limiting the ability to optimize medication.    Past Medical History:  Diagnosis Date   Anemia    CHF (congestive heart failure) (HCC)     Coronary artery disease    Diabetes mellitus without complication (HCC)    Hypertension    ILD (interstitial lung disease) (HCC)    Ischemic cardiomyopathy 06/12/2021   EF 25-30%   Pulmonary HTN (HCC)    Rheumatoid arthritis (HCC)     Past Surgical History:  Procedure Laterality Date   BREAST BIOPSY Left    ?when//benign   CORONARY ANGIOPLASTY     CORONARY ATHERECTOMY N/A 07/17/2021   Procedure: CORONARY ATHERECTOMY;  Surgeon: [place], Peter M, MD;  Location: [facility] INVASIVE CV LAB;  Service: Cardiovascular;  Laterality: N/A;   RIGHT/LEFT HEART CATH AND CORONARY ANGIOGRAPHY N/A 06/16/2021   Procedure: RIGHT/LEFT HEART CATH AND CORONARY ANGIOGRAPHY;  Surgeon: Lyn Records, MD;  Location: [facility] INVASIVE CV LAB;  Service: Cardiovascular;  Laterality: N/A;    Current Medications: Current Meds  Medication Sig   Adalimumab (HUMIRA PEN) 40 MG/0.4ML PNKT Inject 0.4 mLs into the skin every Tuesday. Tuesday   aspirin EC 81 MG tablet Take 1 tablet (81 mg total) by mouth daily. Swallow whole.   carvedilol (COREG) 3.125 MG tablet Take 1 tablet (3.125 mg total) by mouth 2 (two) times daily with a meal.   Cholecalciferol (VITAMIN D3 PO) Take 1 tablet by mouth daily.   clopidogrel (PLAVIX) 75 MG tablet Take 1 tablet (75 mg total) by mouth daily.   dapagliflozin propanediol (FARXIGA) 10 MG TABS tablet Take 1 tablet (10 mg total) by mouth daily before breakfast.   furosemide (LASIX) 40 MG  tablet Take 1 tablet (40 mg total) by mouth once a week.   leucovorin (WELLCOVORIN) 5 MG tablet Take 15 mg by mouth every Saturday.   methotrexate 50 MG/2ML injection Inject 1 mL (25 mg total) into the muscle every Saturday. Hold methotrexate for 2 more weeks and then resume 3/10   Multiple Vitamin (MULTIVITAMIN WITH MINERALS) TABS tablet Take 1 tablet by mouth daily.   nitroGLYCERIN (NITROSTAT) 0.4 MG SL tablet Place 1 tablet (0.4 mg total) under the tongue every 5 (five) minutes as needed.   Polyethyl Glycol-Propyl  Glycol (LUBRICANT EYE DROPS) 0.4-0.3 % SOLN Place 1 drop into both eyes 3 (three) times daily as needed (dry/irritated eyes.).   predniSONE (DELTASONE) 5 MG tablet Take 10 mg by mouth in the morning.   rosuvastatin (CRESTOR) 40 MG tablet Take 1 tablet (40 mg total) by mouth daily.   sacubitril-valsartan (ENTRESTO) 24-26 MG Take 1 tablet by mouth 2 (two) times daily.   spironolactone (ALDACTONE) 25 MG tablet Take 0.5 tablets (12.5 mg total) by mouth daily. Take additional Tablet 12.5 mg for swelling   traMADol (ULTRAM) 50 MG tablet Take 50 mg by mouth every 6 (six) hours as needed (pain.).   [DISCONTINUED] furosemide (LASIX) 40 MG tablet Take 1 tablet (40 mg total) by mouth daily. For 5 days     Allergies:   Penicillins   Social History   Socioeconomic History   Marital status: Widowed    Spouse name: Jaris Agundez   Number of children: 1   Years of education: Not on file   Highest education level: Some college, no degree  Occupational History   Occupation: retired  Tobacco Use   Smoking status: Never   Smokeless tobacco: Never  Vaping Use   Vaping status: Never Used  Substance and Sexual Activity   Alcohol use: Yes    Alcohol/week: 2.0 standard drinks of alcohol    Types: 2 Glasses of wine per week   Drug use: Never   Sexual activity: Not Currently  Other Topics Concern   Not on file  Social History Narrative   Not on file   Social Drivers of Health   Financial Resource Strain: Low Risk  (10/22/2022)   Overall Financial Resource Strain (CARDIA)    Difficulty of Paying Living Expenses: Not very hard  Food Insecurity: No Food Insecurity (10/22/2022)   Hunger Vital Sign    Worried About Running Out of Food in the Last Year: Never true    Ran Out of Food in the Last Year: Never true  Transportation Needs: No Transportation Needs (10/22/2022)   PRAPARE - Administrator, Civil Service (Medical): No    Lack of Transportation (Non-Medical): No  Physical Activity:  Insufficiently Active (10/22/2022)   Exercise Vital Sign    Days of Exercise per Week: 2 days    Minutes of Exercise per Session: 20 min  Stress: No Stress Concern Present (10/22/2022)   [employer] of Occupational Health - Occupational Stress Questionnaire    Feeling of Stress : Not at all  Social Connections: Socially Isolated (10/22/2022)   Social Connection and Isolation Panel [NHANES]    Frequency of Communication with Friends and Family: More than three times a week    Frequency of Social Gatherings with Friends and Family: Not on file    Attends Religious Services: Never    Active Member of Clubs or Organizations: No    Attends Banker Meetings: Never    Marital Status: Widowed  Family History: The patient's family history includes Autoimmune disease in her daughter; Heart disease in her father; Kidney disease in her brother.  ROS:   Review of Systems  Constitution: Negative for decreased appetite, fever and weight gain.  HENT: Negative for congestion, ear discharge, hoarse voice and sore throat.   Eyes: Negative for discharge, redness, vision loss in right eye and visual halos.  Cardiovascular: Negative for chest pain, dyspnea on exertion, leg swelling, orthopnea and palpitations.  Respiratory: Negative for cough, hemoptysis, shortness of breath and snoring.   Endocrine: Negative for heat intolerance and polyphagia.  Hematologic/Lymphatic: Negative for bleeding problem. Does not bruise/bleed easily.  Skin: Negative for flushing, nail changes, rash and suspicious lesions.  Musculoskeletal: Negative for arthritis, joint pain, muscle cramps, myalgias, neck pain and stiffness.  Gastrointestinal: Negative for abdominal pain, bowel incontinence, diarrhea and excessive appetite.  Genitourinary: Negative for decreased libido, genital sores and incomplete emptying.  Neurological: Negative for brief paralysis, focal weakness, headaches and loss of balance.   Psychiatric/Behavioral: Negative for altered mental status, depression and suicidal ideas.  Allergic/Immunologic: Negative for HIV exposure and persistent infections.    EKGs/Labs/Other Studies Reviewed:    The following studies were reviewed today:   EKG:  The ekg ordered today demonstrates   Recent Labs: 07/18/2022: Hemoglobin CANCELED; Platelets CANCELED 01/18/2023: TSH 1.820 05/15/2023: ALT 14; BUN 21; Creatinine, Ser 0.99; Magnesium 2.3; Potassium 4.6; Sodium 139  Recent Lipid Panel    Component Value Date/Time   CHOL 174 01/18/2023 1116   TRIG 134 01/18/2023 1116   HDL 67 01/18/2023 1116   CHOLHDL 2.1 07/18/2022 0954   CHOLHDL 4.7 06/16/2021 0208   VLDL 23 06/16/2021 0208   LDLCALC 84 01/18/2023 1116    Physical Exam:    VS:  BP (!) 118/54 (BP Location: Right Arm, Patient Position: Sitting, Cuff Size: Normal)   Pulse 70   Ht 5\' 7"  (1.702 m)   Wt 163 lb (73.9 kg)   SpO2 95%   BMI 25.53 kg/m     Wt Readings from Last 3 Encounters:  05/15/23 163 lb (73.9 kg)  04/04/23 162 lb 9.6 oz (73.8 kg)  01/18/23 159 lb 3.2 oz (72.2 kg)     GEN: Well nourished, well developed in no acute distress HEENT: Normal NECK: No JVD; No carotid bruits LYMPHATICS: No lymphadenopathy CARDIAC: S1S2 noted,RRR, no murmurs, rubs, gallops RESPIRATORY:  Clear to auscultation without rales, wheezing or rhonchi  ABDOMEN: Soft, non-tender, non-distended, +bowel sounds, no guarding. EXTREMITIES: No edema, No cyanosis, no clubbing MUSCULOSKELETAL:  No deformity  SKIN: Warm and dry NEUROLOGIC:  Alert and oriented x 3, non-focal PSYCHIATRIC:  Normal affect, good insight  ASSESSMENT:    1. DOE (dyspnea on exertion)   2. Medication management   3. Mild pulmonary hypertension (HCC)   4. HFrEF (heart failure with reduced ejection fraction) (HCC)   5. Mixed hyperlipidemia   6. CAD S/P percutaneous coronary angioplasty   7. Nonrheumatic aortic valve insufficiency    PLAN:    Aortic  Regurgitation Increased shortness of breath due to worsening aortic valve leakage. Ejection fraction stable at 45-50%. Limited by low blood pressure for medication optimization. -Continue Lasix once weekly to manage symptoms. -Check renal function today to assess need for potassium supplementation.   Monitor blood pressure closely.  CAD - no angina symptoms  General Health Maintenance -Encourage use of MyChart for communication of any changes in health status.  The patient is in agreement with the above plan. The patient left  the office in stable condition.  The patient will follow up in   Medication Adjustments/Labs and Tests Ordered: Current medicines are reviewed at length with the patient today.  Concerns regarding medicines are outlined above.  Orders Placed This Encounter  Procedures   Magnesium   Comprehensive Metabolic Panel (CMET)   EKG 12-Lead   Meds ordered this encounter  Medications   furosemide (LASIX) 40 MG tablet    Sig: Take 1 tablet (40 mg total) by mouth once a week.    Dispense:  17 tablet    Refill:  3    Patient Instructions  Medication Instructions:  Your physician has recommended you make the following change in your medication:  CHANGE: Lasix 40 mg once weekly *If you need a refill on your cardiac medications before your next appointment, please call your pharmacy*   Lab Work: CMET, Mag If you have labs (blood work) drawn today and your tests are completely normal, you will receive your results only by: MyChart Message (if you have MyChart) OR A paper copy in the mail If you have any lab test that is abnormal or we need to change your treatment, we will call you to review the results.   Follow-Up: At [facility], you and your health needs are our priority.  As part of our continuing mission to provide you with exceptional heart care, we have created designated Provider Care Teams.  These Care Teams include your primary Cardiologist  (physician) and Advanced Practice Providers (APPs -  Physician Assistants and Nurse Practitioners) who all work together to provide you with the care you need, when you need it.     Your next appointment:   6 month(s)  Provider:   Thomasene Ripple, DO     Other Instructions      Adopting a Healthy Lifestyle.  Know what a healthy weight is for you (roughly BMI <25) and aim to maintain this   Aim for 7+ servings of fruits and vegetables daily   65-80+ fluid ounces of water or unsweet tea for healthy kidneys   Limit to max 1 drink of alcohol per day; avoid smoking/tobacco   Limit animal fats in diet for cholesterol and heart health - choose grass fed whenever available   Avoid highly processed foods, and foods high in saturated/trans fats   Aim for low stress - take time to unwind and care for your mental health   Aim for 150 min of moderate intensity exercise weekly for heart health, and weights twice weekly for bone health   Aim for 7-9 hours of sleep daily   When it comes to diets, agreement about the perfect plan isnt easy to find, even among the experts. Experts at the [facility] [employer] developed an idea known as the Healthy Eating Plate. Just imagine a plate divided into logical, healthy portions.   The emphasis is on diet quality:   Load up on vegetables and fruits - one-half of your plate: Aim for color and variety, and remember that potatoes dont count.   Go for whole grains - one-quarter of your plate: Whole wheat, barley, wheat berries, quinoa, oats, brown rice, and foods made with them. If you want pasta, go with whole wheat pasta.   Protein power - one-quarter of your plate: Fish, chicken, beans, and nuts are all healthy, versatile protein sources. Limit red meat.   The diet, however, does go beyond the plate, offering a few other suggestions.   Use healthy  plant oils, such as olive, canola, soy, corn, sunflower and peanut. Check the labels, and avoid  partially hydrogenated oil, which have unhealthy trans fats.   If youre thirsty, drink water. Coffee and tea are good in moderation, but skip sugary drinks and limit milk and dairy products to one or two daily servings.   The type of carbohydrate in the diet is more important than the amount. Some sources of carbohydrates, such as vegetables, fruits, whole grains, and beans-are healthier than others.   Finally, stay active  Signed, Thomasene Ripple, DO  05/19/2023 11:01 AM     [facility]

## 2023-05-21 ENCOUNTER — Ambulatory Visit: Payer: Medicare Other | Admitting: Nurse Practitioner

## 2023-06-12 ENCOUNTER — Encounter: Payer: Self-pay | Admitting: Nurse Practitioner

## 2023-06-12 ENCOUNTER — Ambulatory Visit (INDEPENDENT_AMBULATORY_CARE_PROVIDER_SITE_OTHER): Payer: Medicare Other | Admitting: Nurse Practitioner

## 2023-06-12 VITALS — BP 119/65 | HR 76 | Ht 67.0 in | Wt 159.0 lb

## 2023-06-12 DIAGNOSIS — E782 Mixed hyperlipidemia: Secondary | ICD-10-CM

## 2023-06-12 DIAGNOSIS — Z789 Other specified health status: Secondary | ICD-10-CM | POA: Diagnosis not present

## 2023-06-12 DIAGNOSIS — M0579 Rheumatoid arthritis with rheumatoid factor of multiple sites without organ or systems involvement: Secondary | ICD-10-CM | POA: Diagnosis not present

## 2023-06-12 DIAGNOSIS — I5020 Unspecified systolic (congestive) heart failure: Secondary | ICD-10-CM

## 2023-06-12 DIAGNOSIS — I272 Pulmonary hypertension, unspecified: Secondary | ICD-10-CM

## 2023-06-12 DIAGNOSIS — I700 Atherosclerosis of aorta: Secondary | ICD-10-CM

## 2023-06-12 NOTE — Assessment & Plan Note (Signed)
Chronic. Controled.  Doing well with current regimen.

## 2023-06-12 NOTE — Assessment & Plan Note (Signed)
Drinking 1-2 glasses of wine weekly.  Encouraged cessation.

## 2023-06-12 NOTE — Assessment & Plan Note (Signed)
Observed on CT. Continue with ASA, Plavix and Crestor. Continue to follow up with Cardiology.  ?

## 2023-06-12 NOTE — Assessment & Plan Note (Signed)
Labs ordered at visit today.  Will make recommendations based on lab results.

## 2023-06-12 NOTE — Assessment & Plan Note (Signed)
Chronic. Diagnosed when she was 17.  On Prednisone 5mg  daily, Methotrexate injection weekly, and Humira Injection weekly.  Followed by Rheumatology at [facility].   Continue to follow per their recommendations.

## 2023-06-12 NOTE — Assessment & Plan Note (Signed)
Chronic. Controlled. Continue Coreg, Clifton Custard, spironolactone, lasix, and Plavix. Continue to follow up with Cardiology for further advice if symptoms occur that warrant her to stop taking her medications. Labs ordered today. Follow up in 6 months. Call sooner if concerns arise.

## 2023-06-12 NOTE — Progress Notes (Signed)
BP 119/65   Pulse 76   Ht 5\' 7"  (1.702 m)   Wt 159 lb (72.1 kg)   SpO2 97%   BMI 24.90 kg/m    Subjective:    Patient ID: Veronica Romero, female    DOB: 1950/06/14, 73 y.o.   MRN: [id number]  HPI: Veronica Romero is a 73 y.o. female  Chief Complaint  Patient presents with   Follow-up    Follow-up B/p    HYPERTENSION / HYPERLIPIDEMIA/HEART FAILURE She is followed by HF clinic. Patient states she stopped taking her cardiac medications about two months ago due to diarrhea issues, states diarrhea resolved after she stopped taking certain medications but resumed her rheumatoid medications. She admits to dealing with an increased amount of stress lately after the loss of her husband. She is prescribed Coreg, Farxiga, Entresto, spironolactone, lasix and Plavix.  Satisfied with current treatment? No states caused her to have diarrhea Duration of hypertension: years BP monitoring frequency: occasionally,not daily  BP range:  BP medication side effects: yes Past BP meds:  Lasix,  Carvedilol, Entresto and Losartan Duration of hyperlipidemia: years Cholesterol medication side effects: no Cholesterol supplements: none Past cholesterol medications: rosuvastatin (crestor) Medication compliance: bad compliance Aspirin: Yes Recent stressors: Yes Recurrent headaches: No Visual changes: no Palpitations: no Dyspnea: yes with exertion  Chest pain: no Lower extremity edema: no Dizzy/lightheaded: no   RHEUMATOID Following up with Rheumatology. Taking Humira, methotrexate, Leucovorin and Predisone. She is taking Prednisone 5 mg daily instead of 10 mg daily now.   ALCOHOL USE Patient is drinking 2 glasses of wine about 1 x per week    Relevant past medical, surgical, family and social history reviewed and updated as indicated. Interim medical history since our last visit reviewed. Allergies and medications reviewed and updated.  Review of Systems  Eyes:  Negative for visual  disturbance.  Respiratory:  Negative for chest tightness and shortness of breath.   Cardiovascular:  Negative for chest pain, palpitations and leg swelling.  Neurological:  Negative for dizziness, light-headedness and headaches.    Per HPI unless specifically indicated above     Objective:    BP 119/65   Pulse 76   Ht 5\' 7"  (1.702 m)   Wt 159 lb (72.1 kg)   SpO2 97%   BMI 24.90 kg/m   Wt Readings from Last 3 Encounters:  06/12/23 159 lb (72.1 kg)  05/15/23 163 lb (73.9 kg)  04/04/23 162 lb 9.6 oz (73.8 kg)    Physical Exam Vitals and nursing note reviewed.  Constitutional:      General: She is awake. She is not in acute distress.    Appearance: Normal appearance. She is well-developed, well-groomed and normal weight. She is not ill-appearing, toxic-appearing or diaphoretic.  HENT:     Head: Normocephalic and atraumatic.     Right Ear: Hearing and external ear normal. No drainage.     Left Ear: Hearing and external ear normal. No drainage.     Nose: Nose normal.     Mouth/Throat:     Mouth: Mucous membranes are moist.     Pharynx: Oropharynx is clear.  Eyes:     General: Lids are normal.        Right eye: No discharge.        Left eye: No discharge.     Extraocular Movements: Extraocular movements intact.     Conjunctiva/sclera: Conjunctivae normal.     Pupils: Pupils are equal, round, and reactive to light.  Cardiovascular:     Rate and Rhythm: Normal rate and regular rhythm.     Pulses:          Radial pulses are 2+ on the right side and 2+ on the left side.       Posterior tibial pulses are 2+ on the right side and 2+ on the left side.     Heart sounds: S1 normal and S2 normal. Murmur heard.     No gallop.  Pulmonary:     Effort: Pulmonary effort is normal. No accessory muscle usage or respiratory distress.     Breath sounds: Normal breath sounds. No wheezing or rales.  Musculoskeletal:        General: Deformity (due the rheumatoid on hands and feet) present.  Normal range of motion.     Cervical back: Full passive range of motion without pain, normal range of motion and neck supple.  Skin:    General: Skin is warm and dry.     Capillary Refill: Capillary refill takes less than 2 seconds.  Neurological:     General: No focal deficit present.     Mental Status: She is alert and oriented to person, place, and time. Mental status is at baseline.  Psychiatric:        Attention and Perception: Attention normal.        Mood and Affect: Mood normal.        Speech: Speech normal.        Behavior: Behavior normal. Behavior is cooperative.        Thought Content: Thought content normal.        Judgment: Judgment normal.     Results for orders placed or performed in visit on 05/15/23  Magnesium   Collection Time: 05/15/23 11:25 AM  Result Value Ref Range   Magnesium 2.3 1.6 - 2.3 mg/dL  Comprehensive Metabolic Panel (CMET)   Collection Time: 05/15/23 11:25 AM  Result Value Ref Range   Glucose 103 (H) 70 - 99 mg/dL   BUN 21 8 - 27 mg/dL   Creatinine, Ser 4.09 0.57 - 1.00 mg/dL   eGFR 60 >81 XB/JYN/8.29   BUN/Creatinine Ratio 21 12 - 28   Sodium 139 134 - 144 mmol/L   Potassium 4.6 3.5 - 5.2 mmol/L   Chloride 102 96 - 106 mmol/L   CO2 22 20 - 29 mmol/L   Calcium 9.4 8.7 - 10.3 mg/dL   Total Protein 7.9 6.0 - 8.5 g/dL   Albumin 4.4 3.8 - 4.8 g/dL   Globulin, Total 3.5 1.5 - 4.5 g/dL   Bilirubin Total 0.3 0.0 - 1.2 mg/dL   Alkaline Phosphatase 65 44 - 121 IU/L   AST 25 0 - 40 IU/L   ALT 14 0 - 32 IU/L      Assessment & Plan:   Problem List Items Addressed This Visit       Cardiovascular and Mediastinum   Atherosclerosis of aorta (HCC)   Observed on CT. Continue with ASA, Plavix and Crestor. Continue to follow up with Cardiology.       Relevant Orders   Lipid Profile   Microalbumin, Urine Waived   HFrEF (heart failure with reduced ejection fraction) (HCC)   Chronic. Controlled. Continue Coreg, Clifton Custard, spironolactone,  lasix, and Plavix. Continue to follow up with Cardiology for further advice if symptoms occur that warrant her to stop taking her medications. Labs ordered today. Follow up in 6 months. Call sooner if concerns arise.  Relevant Orders   Comp Met (CMET)   Microalbumin, Urine Waived   Mild pulmonary hypertension (HCC)   Chronic. Controled.  Doing well with current regimen.       Relevant Orders   Microalbumin, Urine Waived     Musculoskeletal and Integument   Rheumatoid arthritis (HCC)   Chronic. Diagnosed when she was 17.  On Prednisone 5mg  daily, Methotrexate injection weekly, and Humira Injection weekly.  Followed by Rheumatology at [facility].   Continue to follow per their recommendations.      Relevant Orders   Microalbumin, Urine Waived     Other   Alcohol use - Primary   Drinking 1-2 glasses of wine weekly.  Encouraged cessation.      Mixed hyperlipidemia   Labs ordered at visit today.  Will make recommendations based on lab results.           Follow up plan: Return in about 6 months (around 12/10/2023) for HTN, HLD, DM2 FU.

## 2023-06-13 ENCOUNTER — Encounter: Payer: Self-pay | Admitting: Nurse Practitioner

## 2023-06-13 LAB — COMPREHENSIVE METABOLIC PANEL
ALT: 12 [IU]/L (ref 0–32)
AST: 24 [IU]/L (ref 0–40)
Albumin: 4.7 g/dL (ref 3.8–4.8)
Alkaline Phosphatase: 55 [IU]/L (ref 44–121)
BUN/Creatinine Ratio: 20 (ref 12–28)
BUN: 18 mg/dL (ref 8–27)
Bilirubin Total: 0.4 mg/dL (ref 0.0–1.2)
CO2: 23 mmol/L (ref 20–29)
Calcium: 10.5 mg/dL — ABNORMAL HIGH (ref 8.7–10.3)
Chloride: 102 mmol/L (ref 96–106)
Creatinine, Ser: 0.91 mg/dL (ref 0.57–1.00)
Globulin, Total: 3.5 g/dL (ref 1.5–4.5)
Glucose: 109 mg/dL — ABNORMAL HIGH (ref 70–99)
Potassium: 4.3 mmol/L (ref 3.5–5.2)
Sodium: 140 mmol/L (ref 134–144)
Total Protein: 8.2 g/dL (ref 6.0–8.5)
eGFR: 66 mL/min/{1.73_m2} (ref >59)

## 2023-06-13 LAB — MICROALBUMIN, URINE WAIVED
Creatinine, Urine Waived: 10 mg/dL (ref 10–300)
Microalb, Ur Waived: 10 mg/L (ref 0–19)

## 2023-06-13 LAB — LIPID PANEL
Chol/HDL Ratio: 2 {ratio} (ref 0.0–4.4)
Cholesterol, Total: 170 mg/dL (ref 100–199)
HDL: 87 mg/dL (ref >39)
LDL Chol Calc (NIH): 68 mg/dL (ref 0–99)
Triglycerides: 83 mg/dL (ref 0–149)
VLDL Cholesterol Cal: 15 mg/dL (ref 5–40)

## 2023-06-22 ENCOUNTER — Other Ambulatory Visit: Payer: Self-pay | Admitting: Nurse Practitioner

## 2023-06-28 MED FILL — PREDNISONE 5 MG TABLET: 30 days supply | Qty: 23 | Fill #0

## 2023-07-09 ENCOUNTER — Ambulatory Visit: Admit: 2023-07-09 | Discharge: 2023-07-10 | Payer: MEDICARE

## 2023-07-09 DIAGNOSIS — M810 Age-related osteoporosis without current pathological fracture: Principal | ICD-10-CM

## 2023-07-09 DIAGNOSIS — R2689 Other abnormalities of gait and mobility: Principal | ICD-10-CM

## 2023-07-09 DIAGNOSIS — E559 Vitamin D deficiency, unspecified: Principal | ICD-10-CM

## 2023-07-09 DIAGNOSIS — Z79631 Methotrexate, long term, current use: Principal | ICD-10-CM

## 2023-07-09 DIAGNOSIS — M0579 Rheumatoid arthritis with rheumatoid factor of multiple sites without organ or systems involvement: Principal | ICD-10-CM

## 2023-07-09 LAB — ALBUMIN: ALBUMIN: 3.6 g/dL (ref 3.4–5.0)

## 2023-07-09 LAB — AST: AST (SGOT): 24 U/L (ref <=34)

## 2023-07-09 LAB — CBC W/ AUTO DIFF
BASOPHILS ABSOLUTE COUNT: 0 10*9/L (ref 0.0–0.1)
BASOPHILS RELATIVE PERCENT: 0.4 %
EOSINOPHILS ABSOLUTE COUNT: 0.1 10*9/L (ref 0.0–0.5)
EOSINOPHILS RELATIVE PERCENT: 1.8 %
HEMATOCRIT: 29.6 % — ABNORMAL LOW (ref 34.0–44.0)
HEMOGLOBIN: 10.1 g/dL — ABNORMAL LOW (ref 11.3–14.9)
LYMPHOCYTES ABSOLUTE COUNT: 2.1 10*9/L (ref 1.1–3.6)
LYMPHOCYTES RELATIVE PERCENT: 26.4 %
MEAN CORPUSCULAR HEMOGLOBIN CONC: 34.3 g/dL (ref 32.0–36.0)
MEAN CORPUSCULAR HEMOGLOBIN: 33.7 pg — ABNORMAL HIGH (ref 25.9–32.4)
MEAN CORPUSCULAR VOLUME: 98.3 fL — ABNORMAL HIGH (ref 77.6–95.7)
MEAN PLATELET VOLUME: 8.5 fL (ref 6.8–10.7)
MONOCYTES ABSOLUTE COUNT: 1 10*9/L — ABNORMAL HIGH (ref 0.3–0.8)
MONOCYTES RELATIVE PERCENT: 12.2 %
NEUTROPHILS ABSOLUTE COUNT: 4.8 10*9/L (ref 1.8–7.8)
NEUTROPHILS RELATIVE PERCENT: 59.2 %
PLATELET COUNT: 233 10*9/L (ref 150–450)
RED BLOOD CELL COUNT: 3.01 10*12/L — ABNORMAL LOW (ref 3.95–5.13)
RED CELL DISTRIBUTION WIDTH: 15.8 % — ABNORMAL HIGH (ref 12.2–15.2)
WBC ADJUSTED: 8.1 10*9/L (ref 3.6–11.2)

## 2023-07-09 LAB — CREATININE
CREATININE: 0.84 mg/dL (ref 0.55–1.02)
EGFR CKD-EPI (2021) FEMALE: 73 mL/min/{1.73_m2} (ref >=60)

## 2023-07-09 LAB — ALT: ALT (SGPT): 11 U/L (ref 10–49)

## 2023-07-09 NOTE — Unmapped (Addendum)
 Rheumatology return visit    REASON FOR VISIT: f/u RA    HISTORY: Amber Stafford is a 73 y.o. female with long-standing seropositive, erosive, nodular rheumatoid arthritis dx in her 20's, also with advanced secondary DJD of multiple joints.   She has severe secondary ankle DJD, and surgery has been offered for this, pt still considering this.   Additional hx of heart failure thought to be secondary to ischemic cardiomyopathy. Reduced EF noted in 2023 when hospitalized for COVID-19 pneumonia. Catheterization showed stenosis and consistent with pulmonary hypertension. Followed by [facility] cardiology.     Treatment history:  - prednisone   - mtx (oral then injections)  - gold injections   - Established care with [facility] rheumatology in 11/2011, taking prednisone monotherapy. MTX resumed.   - Humira added 01/2012.  - difficulty tapering below 5 mg prednisone  - Humira increased to weekly dosing 2019  - Current recommended med regimen: Humira 40 mg q wk, mtx 25 mg SQ weekly, leucovorin 15 mg the day after methotrexate, prednisone 2.5 qd     Additional hx of mild osteopenia.   -DEXA 2019: T score L-spine and -0.4, femur neck -1.2  -Reclast recommended 10/2020  -DEXA 12/2020: T score L-spine 0.0, femur neck -0.9. FRAX score showing 10 yr probability of  19% for of major osteoporotic fracture and a 2.9% for hip fracture.  - Reclast recommended 04/2022, received 03/20/23  - Dexa 01/2523 showing Tscore Lspine 0.0 and femur neck -2.2.       History of Present Illness    She experiences significant joint issues, particularly in her hands and feet. Her feet are described as 'so crooked' that she frequently injures them, resulting in the loss of a toenail. She attributes this to being 'spastic' and notes difficulty in finding suitable footwear due to her feet getting too hot. Can only tolerate sandals. She uses a cane for stability due to increasing unsteadiness. She experiences morning stiffness lasting about an hour, requiring her to wake up earlier to manage her routine.   Taking pred 2.5 mg qd, occasionally doubling the dose every two weeks. She administers methotrexate on Saturdays and Humira on Tuesdays. She recently received Reclast for bone health and is up to date with her pneumonia vaccinations.    She reports persistent stomach issues characterized by pain and diarrhea, which she attributes to stress. The symptoms are intermittent and have improved somewhat. She frequently uses Imodium for relief, although her primary doctor disapproves of its frequent use. She has not undergone a colonoscopy. There is no blood in her stool. She has switched from coffee to tea due to stomach discomfort. She is not wanting to pursue a colonoscopy now.     She reports unsteadiness while walking, requiring the use of a cane and assistance from her daughter. She is interested in physical therapy to improve her gait and stability.    She has recently moved in with her daughter into a townhouse, where she and her daughter have separate living spaces. This has been working well for her.      We reviewed her cardiac history today. Followed [facility] now with note from 05/2023 indicating stable EF 45-50%. We discussed that if her heart failure worsens, we may have to consider changing humira to a different agent.           CURRENT MEDICATIONS:  Current Outpatient Medications   Medication Sig Dispense Refill    aspirin (ECOTRIN) 81 MG tablet Take  1 tablet (81 mg total) by mouth.      carvediloL (COREG) 3.125 MG tablet Take 1 tablet (3.125 mg total) by mouth.      cholecalciferol, vitamin D3, 100 mcg (4,000 unit) cap Take by mouth.      clopidogreL (PLAVIX) 75 mg tablet Take 1 tablet (75 mg total) by mouth daily.      diclofenac sodium (VOLTAREN) 1 % gel Apply 2 g topically four (4) times a day. 100 g 0    ENTRESTO 24-26 mg tablet Take 1 mg by mouth Two (2) times a day (at 8am and 12:00).      furosemide (LASIX) 40 MG tablet Take 1 tablet (40 mg total) by mouth.      HUMIRA PEN CITRATE FREE 40 MG/0.4 ML Inject the contents of 1 pen (40 mg total) under the skin every seven (7) days. 12 each 3    leuCOVorin (WELLCOVORIN) 5 mg tablet Take 3 tablets (15 mg total) by mouth once a week. 36 tablet 6    losartan (COZAAR) 25 MG tablet       methotrexate sodium (METHOTREXATE, CONTAINS PRESERVATIVES,) 25 mg/mL injection solution Inject 1 mL (25 mg total) under the skin once a week. 12 mL 1    multivitamin,tx-iron-minerals Tab Take 1 tablet by mouth daily.      nitroglycerin (NITROSTAT) 0.4 MG SL tablet Place 1 tablet (0.4 mg total) under the tongue.      omeprazole 20 mg tablet Take 1 tablet (20 mg total) by mouth daily.      potassium chloride 20 MEQ ER tablet Take 1 tablet (20 mEq total) by mouth daily.      predniSONE (DELTASONE) 5 MG tablet Take 1 tablet (5 mg) by mouth every other day alternative with 1/2 tablet (2.5 mg) every other day 30 tablet 6    spironolactone (ALDACTONE) 25 MG tablet Take 0.5 tablets (12.5 mg total) by mouth.      syringe (BD TUBERCULIN SYRINGE) 1 mL 27 x 1/2 Syrg Use one syringe as directed  once a week. 50 each 3    traMADol (ULTRAM) 50 mg tablet TAKE 1 TABLET BY MOUTH EVERY EIGHT HOURS AS NEEDED FOR PAIN. 90 tablet 0     No current facility-administered medications for this visit.       Record Review: Available records were reviewed, including pertinent office visits, labs, and imaging.      REVIEW OF SYSTEMS: Ten system were reviewed and negative except as noted above.    PHYSICAL EXAM:  VITAL SIGNS:   Vitals:    07/09/23 1132   BP: 103/61   BP Site: L Arm   BP Position: Sitting   BP Cuff Size: Medium   Pulse: 80   Temp: 36.7 ??C (98.1 ??F)   TempSrc: Temporal   Weight: 73.8 kg (162 lb 12.8 oz)           General:   Pleasant 72 y.o.female in no acute distress, WDWN   Cardiovascular:  Regular rate and rhythm. +murmur    Lungs:  Clear to auscultation.Normal respiratory effort.    Musculoskeletal:   General: Ambulates w/ cane   Hands: Advanced rheumatoid deformities b/l with UD. Multiple nodules noted b/l. No tenderness.  No clear synovitis  Wrists: Reduced ROM w/o synovitis or tenderness  Elbows: Nodules b/l. FROM   Shoulders: FROM w/o pain  Knees: bony enlargement, FROM, marked crepitus on ROM.  Valgus deformities left more than right.  Ankles: Bony enlargement and reduced range  of motion bilaterally, cool, tender  Feet: Rheumatoid deformities of toes b/l, no pain with MTP squeeze    Psych:  Appropriate affect and mood   Skin:  Scab on L 2nd toe      Swollen Joint Count (0-28): 0  Tender Joint Count (0-28): 0  Patient Global Assessment of Disease Activity (0-10): 5  Evaluator Global Assessment of Disease (0-10): 1  CDAI Score: 6   CDAI interpretation  0.0-2.8 Remission   2.9-10.0 Low disease activity   10.1-22.0 Moderate disease activity   22.1-76.0 High disease activity     Assessment/plan:    1. Seropositive rheumatoid arthritis of multiple sites (CMS-HCC) [M05.79] (Primary)  Stable appearing. Continue  Humira 40 mg q wk, mtx 25 mg SQ weekly, leucovorin 15 mg the day after methotrexate, prednisone 2.5 qd.    2. Imbalance  Referral to PT printed and rendered to pt as she preferred to seek PT locally.   - AMB REFERRAL TO PHYSICAL THERAPY; Future    3. Methotrexate, long term, current use  Checking labs below to evaluate for medication toxicity.    - Albumin  - ALT  - AST  - CBC w/ Differential  - Creatinine    4. Osteopenia with high frax score   S/p 1 reclast infusion. Check serum calcium and vit d   - Vitamin D 25 Hydroxy (25OH D2 + D3)  - Calcium          HCM:   - PCV13 Status: 01/27/2015  - PPSV 23 Status: 02/09/2016  - PCV20: 03/06/23  -COVID-19 vaccine: 01/18/23  - Annual Influenza vaccine. Status: 01/18/23  - Bone health: See note on osteopenia above  - Contraception: Postmenopausal      Return appointment in 4 mo as scheduled with Dr Olean Ree.     Greater than 40 minutes spent in visit with patient, including pre and postvisit activities.

## 2023-07-11 LAB — VITAMIN D 25 HYDROXY: VITAMIN D, TOTAL (25OH): 68.8 ng/mL (ref 20.0–80.0)

## 2023-07-12 MED FILL — BD TUBERCULIN SYRINGE 1 ML 27 X 1/2": 84 days supply | Qty: 12 | Fill #1

## 2023-07-28 ENCOUNTER — Encounter: Payer: Self-pay | Admitting: Nurse Practitioner

## 2023-08-07 MED FILL — LEUCOVORIN CALCIUM 5 MG TABLET: ORAL | 84 days supply | Qty: 36 | Fill #1

## 2023-08-07 MED FILL — PREDNISONE 5 MG TABLET: 30 days supply | Qty: 23 | Fill #1

## 2023-08-08 ENCOUNTER — Ambulatory Visit (INDEPENDENT_AMBULATORY_CARE_PROVIDER_SITE_OTHER): Admitting: Nurse Practitioner

## 2023-08-08 ENCOUNTER — Encounter: Payer: Self-pay | Admitting: Nurse Practitioner

## 2023-08-08 VITALS — BP 97/59 | HR 77 | Temp 98.3°F | Resp 15 | Ht 67.01 in | Wt 162.0 lb

## 2023-08-08 DIAGNOSIS — D649 Anemia, unspecified: Secondary | ICD-10-CM | POA: Diagnosis not present

## 2023-08-08 NOTE — Progress Notes (Signed)
 BP (!) 97/59 (BP Location: Left Arm, Patient Position: Sitting, Cuff Size: Normal)   Pulse 77   Temp 98.3 F (36.8 C) (Oral)   Resp 15   Ht 5' 7.01" (1.702 m)   Wt 162 lb (73.5 kg)   SpO2 99%   BMI 25.37 kg/m    Subjective:    Patient ID: Veronica Romero, female    DOB: [birth year], 73 y.o.   MRN: [id number]  HPI: Veronica Romero is a 73 y.o. female  Chief Complaint  Patient presents with   Blood work     Rheumatology is concerned with her numbers that are very low. Has been very tired, worse over the last month. Long standing however for years, "lives with it".    ANEMIA Anemia status: exacerbated Etiology of anemia: Duration of anemia treatment:  Compliance with treatment: excellent compliance Iron supplementation side effects: no Severity of anemia: moderate Fatigue: yes Decreased exercise tolerance: yes  Dyspnea on exertion: yes Palpitations: no Bleeding: no Pica: no  Relevant past medical, surgical, family and social history reviewed and updated as indicated. Interim medical history since our last visit reviewed. Allergies and medications reviewed and updated.  Review of Systems  Constitutional:  Positive for fatigue.  Respiratory:  Positive for shortness of breath.     Per HPI unless specifically indicated above     Objective:    BP (!) 97/59 (BP Location: Left Arm, Patient Position: Sitting, Cuff Size: Normal)   Pulse 77   Temp 98.3 F (36.8 C) (Oral)   Resp 15   Ht 5' 7.01" (1.702 m)   Wt 162 lb (73.5 kg)   SpO2 99%   BMI 25.37 kg/m   Wt Readings from Last 3 Encounters:  08/08/23 162 lb (73.5 kg)  06/12/23 159 lb (72.1 kg)  05/15/23 163 lb (73.9 kg)    Physical Exam Vitals and nursing note reviewed.  Constitutional:      General: She is not in acute distress.    Appearance: Normal appearance. She is normal weight. She is not ill-appearing, toxic-appearing or diaphoretic.  HENT:     Head: Normocephalic.     Right Ear: External ear  normal.     Left Ear: External ear normal.     Nose: Nose normal.     Mouth/Throat:     Mouth: Mucous membranes are moist.     Pharynx: Oropharynx is clear.  Eyes:     General:        Right eye: No discharge.        Left eye: No discharge.     Extraocular Movements: Extraocular movements intact.     Conjunctiva/sclera: Conjunctivae normal.     Pupils: Pupils are equal, round, and reactive to light.  Cardiovascular:     Rate and Rhythm: Normal rate and regular rhythm.     Heart sounds: No murmur heard. Pulmonary:     Effort: Pulmonary effort is normal. No respiratory distress.     Breath sounds: Normal breath sounds. No wheezing or rales.  Musculoskeletal:     Cervical back: Normal range of motion and neck supple.  Skin:    General: Skin is warm and dry.     Capillary Refill: Capillary refill takes less than 2 seconds.  Neurological:     General: No focal deficit present.     Mental Status: She is alert and oriented to person, place, and time. Mental status is at baseline.  Psychiatric:  Mood and Affect: Mood normal.        Behavior: Behavior normal.        Thought Content: Thought content normal.        Judgment: Judgment normal.     Results for orders placed or performed in visit on 06/12/23  Comp Met (CMET)   Collection Time: 06/12/23  3:47 PM  Result Value Ref Range   Glucose 109 (H) 70 - 99 mg/dL   BUN 18 8 - 27 mg/dL   Creatinine, Ser 3.08 0.57 - 1.00 mg/dL   eGFR 66 >65 HQ/ION/6.29   BUN/Creatinine Ratio 20 12 - 28   Sodium 140 134 - 144 mmol/L   Potassium 4.3 3.5 - 5.2 mmol/L   Chloride 102 96 - 106 mmol/L   CO2 23 20 - 29 mmol/L   Calcium 10.5 (H) 8.7 - 10.3 mg/dL   Total Protein 8.2 6.0 - 8.5 g/dL   Albumin 4.7 3.8 - 4.8 g/dL   Globulin, Total 3.5 1.5 - 4.5 g/dL   Bilirubin Total 0.4 0.0 - 1.2 mg/dL   Alkaline Phosphatase 55 44 - 121 IU/L   AST 24 0 - 40 IU/L   ALT 12 0 - 32 IU/L  Lipid Profile   Collection Time: 06/12/23  3:47 PM  Result Value  Ref Range   Cholesterol, Total 170 100 - 199 mg/dL   Triglycerides 83 0 - 149 mg/dL   HDL 87 >52 mg/dL   VLDL Cholesterol Cal 15 5 - 40 mg/dL   LDL Chol Calc (NIH) 68 0 - 99 mg/dL   Chol/HDL Ratio 2.0 0.0 - 4.4 ratio  Microalbumin, Urine Waived   Collection Time: 06/12/23  3:48 PM  Result Value Ref Range   Microalb, Ur Waived 10 0 - 19 mg/L   Creatinine, Urine Waived 10 10 - 300 mg/dL   Microalb/Creat Ratio 30-300 (H) <30 mg/g      Assessment & Plan:   Problem List Items Addressed This Visit       Other   Anemia - Primary   Hemoglobin in November 11 and dropped to 10 at visit with Rheumatology 1 month ago.  Will check anemia profile to determine etiology of anemia.  Will make recommendations based on results.       Relevant Orders   Anemia Profile B     Follow up plan: No follow-ups on file.

## 2023-08-08 NOTE — Assessment & Plan Note (Signed)
 Hemoglobin in November 11 and dropped to 10 at visit with Rheumatology 1 month ago.  Will check anemia profile to determine etiology of anemia.  Will make recommendations based on results.

## 2023-08-09 ENCOUNTER — Encounter: Payer: Self-pay | Admitting: Nurse Practitioner

## 2023-08-09 LAB — ANEMIA PROFILE B
Basophils Absolute: 0.1 10*3/uL (ref 0.0–0.2)
Basos: 1 %
EOS (ABSOLUTE): 0.2 10*3/uL (ref 0.0–0.4)
Eos: 1 %
Ferritin: 58 ng/mL (ref 15–150)
Folate: >20 ng/mL (ref >3.0)
Hematocrit: 31.1 % — ABNORMAL LOW (ref 34.0–46.6)
Hemoglobin: 10.3 g/dL — ABNORMAL LOW (ref 11.1–15.9)
Immature Grans (Abs): 0 10*3/uL (ref 0.0–0.1)
Immature Granulocytes: 0 %
Iron Saturation: 24 % (ref 15–55)
Iron: 81 ug/dL (ref 27–139)
Lymphocytes Absolute: 2.1 10*3/uL (ref 0.7–3.1)
Lymphs: 19 %
MCH: 33.2 pg — ABNORMAL HIGH (ref 26.6–33.0)
MCHC: 33.1 g/dL (ref 31.5–35.7)
MCV: 100 fL — ABNORMAL HIGH (ref 79–97)
Monocytes Absolute: 0.6 10*3/uL (ref 0.1–0.9)
Monocytes: 5 %
Neutrophils Absolute: 8.5 10*3/uL — ABNORMAL HIGH (ref 1.4–7.0)
Neutrophils: 74 %
Platelets: 252 10*3/uL (ref 150–450)
RBC: 3.1 x10E6/uL — ABNORMAL LOW (ref 3.77–5.28)
RDW: 12.9 % (ref 11.7–15.4)
Retic Ct Pct: 1 % (ref 0.6–2.6)
Total Iron Binding Capacity: 344 ug/dL (ref 250–450)
UIBC: 263 ug/dL (ref 118–369)
Vitamin B-12: >2000 pg/mL — ABNORMAL HIGH (ref 232–1245)
WBC: 11.5 10*3/uL — ABNORMAL HIGH (ref 3.4–10.8)

## 2023-08-12 DIAGNOSIS — M05721 Rheumatoid arthritis with rheumatoid factor of right elbow without organ or systems involvement: Principal | ICD-10-CM

## 2023-08-12 DIAGNOSIS — M05722 Rheumatoid arthritis with rheumatoid factor of left elbow without organ or systems involvement: Principal | ICD-10-CM

## 2023-08-12 MED ORDER — HUMIRA PEN CITRATE FREE 40 MG/0.4 ML
SUBCUTANEOUS | 3 refills | 84.00 days | Status: CP
Start: 2023-08-12 — End: ?
  Filled 2023-08-26: qty 12, 84d supply, fill #0

## 2023-08-12 NOTE — Unmapped (Signed)
 Pt notes need for renewal of [employer] assistance for humira . Send rx to [facility] pharmacy.

## 2023-08-13 DIAGNOSIS — M05721 Rheumatoid arthritis with rheumatoid factor of right elbow without organ or systems involvement: Principal | ICD-10-CM

## 2023-08-13 DIAGNOSIS — M05722 Rheumatoid arthritis with rheumatoid factor of left elbow without organ or systems involvement: Principal | ICD-10-CM

## 2023-08-23 NOTE — Unmapped (Signed)
 [facility] Specialty Medication Onboarding    Specialty Medication: Humira  Prior Authorization: Approved   Financial Assistance: No - copay  <$25  Final Copay/Day Supply: $4.80 / 84 days    Insurance Restrictions: None     Notes to Pharmacist: pt enrolled in MTX que - can we un enroll from that que please   Credit Card on File: yes  Start Date on Rx:      The triage team has completed the benefits investigation and has determined that the patient is able to fill this medication at [facility]. Please contact the patient to complete the onboarding or follow up with the prescribing physician as needed.

## 2023-08-23 NOTE — Unmapped (Signed)
 [facility]    Patient Onboarding/Medication Counseling    Amber Stafford is a 73 y.o. female with RA who I am counseling today on continuation of therapy.  I am speaking to the patient.    Was a Nurse, learning disability used for this call? No    Verified patient's date of birth / HIPAA.    Specialty medication(s) to be sent: Inflammatory Disorders: Humira      Non-specialty medications/supplies to be sent: n/a      Medications not needed at this time: methotrexate (got from [facility])          Humira (adalimumab)    The patient declined counseling on medication administration, missed dose instructions, goals of therapy, side effects and monitoring parameters, warnings and precautions, drug/food interactions, and storage, handling precautions, and disposal because they have taken the medication previously. The information in the declined sections below are for informational purposes only and was not discussed with patient.     Medication & Administration     Dosage: Rheumatoid arthritis: Inject 40mg  under the skin every 7 days    Lab tests required prior to treatment initiation:  Tuberculosis: Tuberculosis screening resulted in a non-reactive Quantiferon TB Gold assay.  Hepatitis B: Hepatitis B serology studies are complete and non-reactive.    Administration:     Prefilled auto-injector pen  1. Gather all supplies needed for injection on a clean, flat working surface: medication pen removed from packaging, alcohol swab, sharps container, etc.  2. Look at the medication label - look for correct medication, correct dose, and check the expiration date  3. Look at the medication - the liquid visible in the window on the side of the pen device should appear clear and colorless  4. Lay the auto-injector pen on a flat surface and allow it to warm up to room temperature for at least 30-45 minutes  5. Select injection site - you can use the front of your thigh or your belly (but not the area 2 inches around your belly button); if someone else is giving you the injection you can also use your upper arm in the skin covering your triceps muscle  6. Prepare injection site - wash your hands and clean the skin at the injection site with an alcohol swab and let it air dry, do not touch the injection site again before the injection  7. Pull the 2 safety caps straight off - gray/white to uncover the needle cover and the plum cap to uncover the plum activator button, do not remove until immediately prior to injection and do not touch the white needle cover  8. Gently squeeze the area of cleaned skin and hold it firmly to create a firm surface at the selected injection site  9. Put the white needle cover against your skin at the injection site at a 90 degree angle, hold the pen such that you can see the clear medication window  10. Press down and hold the pen firmly against your skin, press the plum activator button to initiate the injection, there will be a click when the injection starts  11. Continue to hold the pen firmly against your skin for about 10-15 seconds - the window will start to turn solid yellow  12. To verify the injection is complete after 10-15 seconds, look and ensure the window is solid yellow and then pull the pen away from your skin  13. Dispose of the used auto-injector pen immediately in your Teacher, music  the needle will be covered automatically  14. If you see any blood at the injection site, press a cotton ball or gauze on the site and maintain pressure until the bleeding stops, do not rub the injection site    Adherence/Missed dose instructions:  If your injection is given more than 3 days after your scheduled injection date - consult your pharmacist for additional instructions on how to adjust your dosing schedule.    Goals of Therapy     - Achieve symptom remission  - Slow disease progression  - Protection of remaining articular structures  - Maintenance of function  - Maintenance of effective psychosocial functioning    Side Effects & Monitoring Parameters     Injection site reaction (redness, irritation, inflammation localized to the site of administration)  Signs of a common cold - minor sore throat, runny or stuffy nose, etc.  Upset stomach  Headache    The following side effects should be reported to the provider:  Signs of a hypersensitivity reaction - rash; hives; itching; red, swollen, blistered, or peeling skin; wheezing; tightness in the chest or throat; difficulty breathing, swallowing, or talking; swelling of the mouth, face, lips, tongue, or throat; etc.  Reduced immune function - report signs of infection such as fever; chills; body aches; very bad sore throat; ear or sinus pain; cough; more sputum or change in color of sputum; pain with passing urine; wound that will not heal, etc.  Also at a slightly higher risk of some malignancies (mainly skin and blood cancers) due to this reduced immune function.  In the case of signs of infection - the patient should hold the next dose of Humira?? and call your primary care provider to ensure adequate medical care.  Treatment may be resumed when infection is treated and patient is asymptomatic.  Changes in skin - a new growth or lump that forms; changes in shape, size, or color of a previous mole or marking  Signs of unexplained bruising or bleeding - throwing up blood or emesis that looks like coffee grounds; black, tarry, or bloody stool; etc.  Signs of new or worsening heart failure - shortness of breath; sudden weight gain; heartbeat that is not normal; swelling in the arms or legs that is new or worse      Contraindications, Warnings, & Precautions     Have your bloodwork checked as you have been told by your prescriber  Talk with your doctor if you are pregnant, planning to become pregnant, or breastfeeding  Discuss the possible need for holding your dose(s) of Humira?? when a planned procedure is scheduled with the prescriber as it may delay healing/recovery timeline       Drug/Food Interactions     Medication list reviewed in Epic. The patient was instructed to inform the care team before taking any new medications or supplements. No drug interactions identified.   Talk with you prescriber or pharmacist before receiving any live vaccinations while taking this medication and after you stop taking it    Storage, Handling Precautions, & Disposal     Store this medication in the refrigerator.  Do not freeze  If needed, you may store at room temperature for up to 14 days  Store in original packaging, protected from light  Do not shake  Dispose of used syringes/pens in a sharps disposal container          Current Medications (including OTC/herbals), Comorbidities and Allergies     Current Outpatient Medications  Medication Sig Dispense Refill    aspirin (ECOTRIN) 81 MG tablet Take 1 tablet (81 mg total) by mouth.      carvediloL (COREG) 3.125 MG tablet Take 1 tablet (3.125 mg total) by mouth.      cholecalciferol, vitamin D3, 100 mcg (4,000 unit) cap Take by mouth.      clopidogreL (PLAVIX) 75 mg tablet Take 1 tablet (75 mg total) by mouth daily.      diclofenac sodium (VOLTAREN) 1 % gel Apply 2 g topically four (4) times a day. 100 g 0    ENTRESTO 24-26 mg tablet Take 1 mg by mouth Two (2) times a day (at 8am and 12:00).      furosemide (LASIX) 40 MG tablet Take 1 tablet (40 mg total) by mouth.      HUMIRA PEN CITRATE FREE 40 MG/0.4 ML Inject the contents of 1 pen (40 mg total) under the skin every seven (7) days. 12 each 3    leuCOVorin (WELLCOVORIN) 5 mg tablet Take 3 tablets (15 mg total) by mouth once a week. 36 tablet 6    losartan (COZAAR) 25 MG tablet       methotrexate sodium (METHOTREXATE, CONTAINS PRESERVATIVES,) 25 mg/mL injection solution Inject 1 mL (25 mg total) under the skin once a week. 12 mL 1    multivitamin,tx-iron-minerals Tab Take 1 tablet by mouth daily.      nitroglycerin (NITROSTAT) 0.4 MG SL tablet Place 1 tablet (0.4 mg total) under the tongue.      omeprazole 20 mg tablet Take 1 tablet (20 mg total) by mouth daily.      potassium chloride 20 MEQ ER tablet Take 1 tablet (20 mEq total) by mouth daily.      predniSONE (DELTASONE) 5 MG tablet Take 1 tablet (5 mg) by mouth every other day alternative with 1/2 tablet (2.5 mg) every other day 30 tablet 6    spironolactone (ALDACTONE) 25 MG tablet Take 0.5 tablets (12.5 mg total) by mouth.      syringe (BD TUBERCULIN SYRINGE) 1 mL 27 x 1/2 Syrg Use one syringe as directed  once a week. 50 each 3    traMADol (ULTRAM) 50 mg tablet TAKE 1 TABLET BY MOUTH EVERY EIGHT HOURS AS NEEDED FOR PAIN. 90 tablet 0     No current facility-administered medications for this visit.       Allergies   Allergen Reactions    Penicillins      Other reaction(s): RASH       Patient Active Problem List   Diagnosis    Hypertension, benign    Primary localized osteoarthrosis, ankle and foot    Seropositive rheumatoid arthritis of multiple sites    RA (rheumatoid arthritis)    Stasis dermatitis    DJD (degenerative joint disease), ankle and foot    Depression with anxiety    Long-term use of high-risk medication    Osteopenia of multiple sites    Pain and swelling of right knee    Osteoporosis       Medication list has been reviewed and updated in Epic: Yes    Allergies have been reviewed and updated in Epic: Yes    Appropriateness of Therapy     Acute infections noted within Epic:  No active infections  Patient reported infection: None    Is the medication and dose appropriate based on diagnosis, medication list, comorbidities, allergies, medical history, patient???s ability to self-administer the medication, and therapeutic goals? Yes  Prescription has been clinically reviewed: Yes      Baseline Quality of Life Assessment      How many days over the past month did your RA  keep you from your normal activities? For example, brushing your teeth or getting up in the morning. It was helping but was off for a bit due to insurance     Financial Information     Medication Assistance provided: Prior Authorization    Anticipated copay of $4.80/84 days  reviewed with patient. Verified delivery address.    Delivery Information     Scheduled delivery date: 4/29    Expected start date: 4/29      Medication will be delivered via UPS to the prescription address in [facility]  This shipment will not require a signature.      Explained the services we provide at [facility] and that each month we would call to set up refills.  Stressed importance of returning phone calls so that we could ensure they receive their medications in time each month.  Informed patient that we should be setting up refills 7-10 days prior to when they will run out of medication.  A pharmacist will reach out to perform a clinical assessment periodically.  Informed patient that a welcome packet, containing information about our pharmacy and other support services, a Notice of Privacy Practices, and a drug information handout will be sent.      The patient or caregiver noted above participated in the development of this care plan and knows that they can request review of or adjustments to the care plan at any time.      Patient or caregiver verbalized understanding of the above information as well as how to contact the pharmacy at [phone] option 4 with any questions/concerns.  The pharmacy is open Monday through Friday 8:30am-4:30pm.  A pharmacist is available 24/7 via pager to answer any clinical questions they may have.    Patient Specific Needs     Does the patient have any physical, cognitive, or cultural barriers? No    Does the patient have adequate living arrangements? (i.e. the ability to store and take their medication appropriately) Yes    Did you identify any home environmental safety or security hazards? No    Patient prefers to have medications discussed with  Patient     Is the patient or caregiver able to read and understand education materials at a high school level or above? Yes    Patient's primary language is  English     Is the patient high risk? No    Does the patient have an additional or emergency contact listed in their chart? Yes    SOCIAL DETERMINANTS OF HEALTH     At the [facility], we have learned that life circumstances - like trouble affording food, housing, utilities, or transportation can affect the health of many of our patients.   That is why we wanted to ask: are you currently experiencing any life circumstances that are negatively impacting your health and/or quality of life? No    Social Drivers of Health     Food Insecurity: Food Insecurity Present (06/11/2023)    Received from [facility]    Hunger Vital Sign     Worried About Running Out of Food in the Last Year: Sometimes true     Ran Out of Food in the Last Year: Sometimes true   Tobacco Use: Medium Risk (07/09/2023)  Patient History     Smoking Tobacco Use: Former     Smokeless Tobacco Use: Never     Passive Exposure: Not on file   Transportation Needs: No Transportation Needs (06/11/2023)    Received from [facility] - Transportation     Lack of Transportation (Medical): No     Lack of Transportation (Non-Medical): No   Alcohol Use: Not on file   Housing: Not on file   Physical Activity: Inactive (06/11/2023)    Received from [facility]    Exercise Vital Sign     Days of Exercise per Week: 0 days     Minutes of Exercise per Session: 20 min   Utilities: Not At Risk (10/22/2022)    Received from [facility]     Threatened with loss of utilities: No   Stress: No Stress Concern Present (06/11/2023)    Received from [facility] - Occupational Stress Questionnaire     Feeling of Stress : Only a little   Interpersonal Safety: Not on file   Substance Use: Not on file (03/05/2023)   Intimate Partner Violence: Not At Risk (10/22/2022)    Received from [facility]    Humiliation, Afraid, Rape, and Kick questionnaire     Fear of Current or Ex-Partner: No     Emotionally Abused: No     Physically Abused: No     Sexually Abused: No   Social Connections: Socially Isolated (06/11/2023)    Received from [facility]    Social Connection and Isolation Panel [NHANES]     Frequency of Communication with Friends and Family: Three times a week     Frequency of Social Gatherings with Friends and Family: More than three times a week     Attends Religious Services: Never     Database administrator or Organizations: No     Attends Banker Meetings: Never     Marital Status: Widowed   Physicist, medical Strain: Low Risk  (06/11/2023)    Received from [facility]    Overall Financial Resource Strain (CARDIA)     Difficulty of Paying Living Expenses: Not very hard   Depression: Not on file   Internet Connectivity: Not on file   Health Literacy: Not on file       Would you be willing to receive help with any of the needs that you have identified today? Not applicable       Sherle Dire, PharmD  [facility] Specialty Pharmacist

## 2023-08-24 IMAGING — CT CT CHEST HIGH RESOLUTION
2 of 7 series · 13 of 36 positions shown, 16 images · non-contrast
Comparison: 06/18/2021, 06/12/2011

CLINICAL DATA: Interstitial lung disease, rheumatoid arthritis



[Series 5: high resolution retro · axial · 0.55mm/px · z∈[-634,-434]mm · 10 of 241 slices shown, 13 images]
[im 21/241  mediastinal]
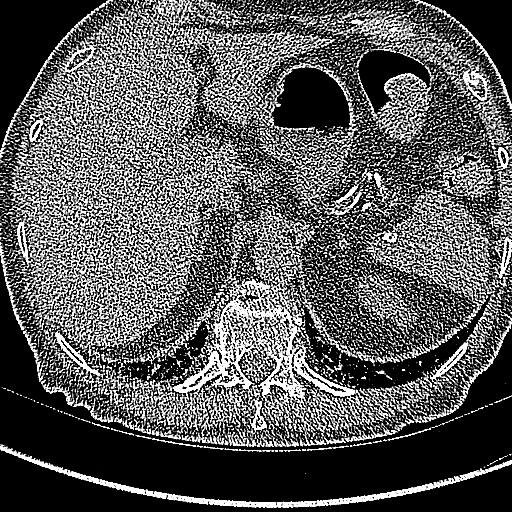
[im 21/241  lung]
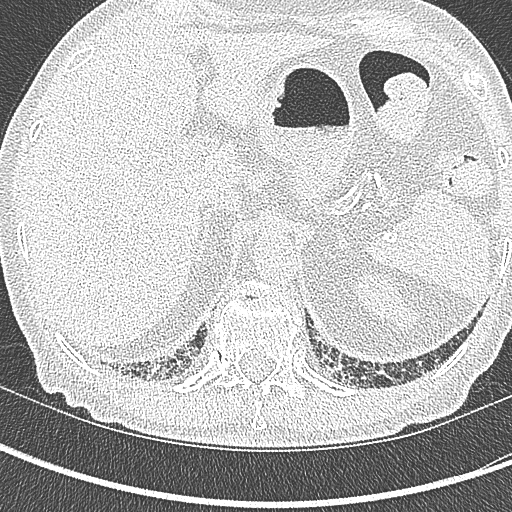
[im 41/241  lung]
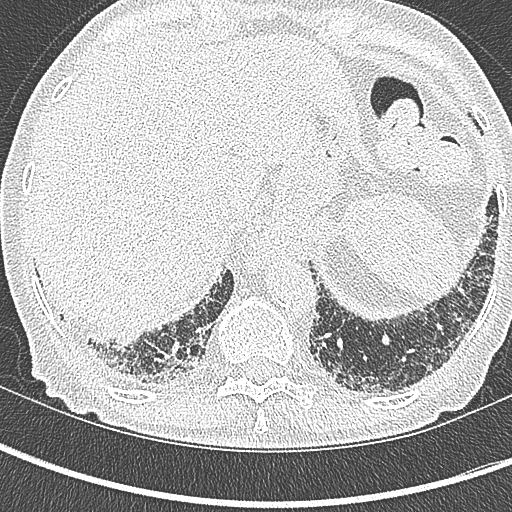
[im 61/241  lung]
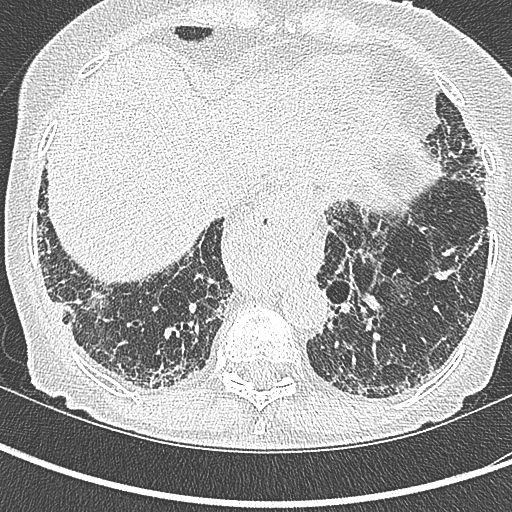
[im 81/241  lung]
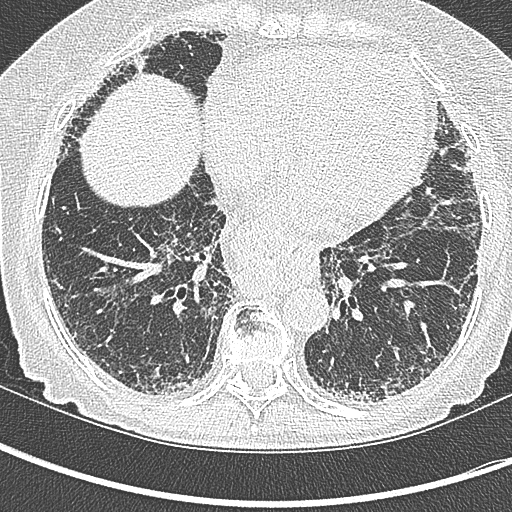
[im 101/241  mediastinal]
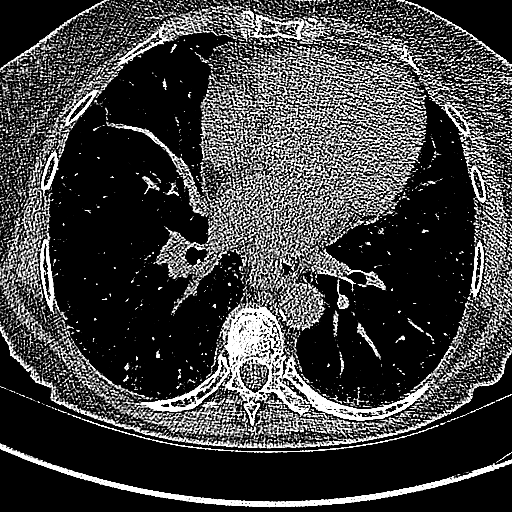
[im 101/241  lung]
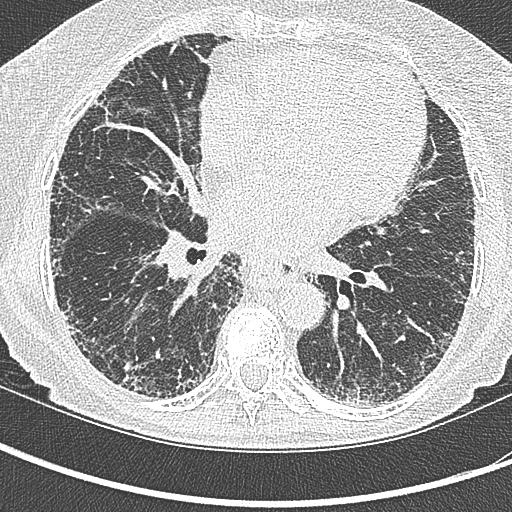
[im 141/241  lung]
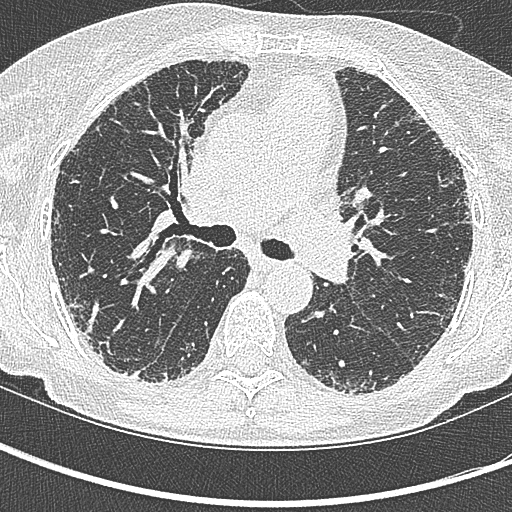
[im 161/241  lung]
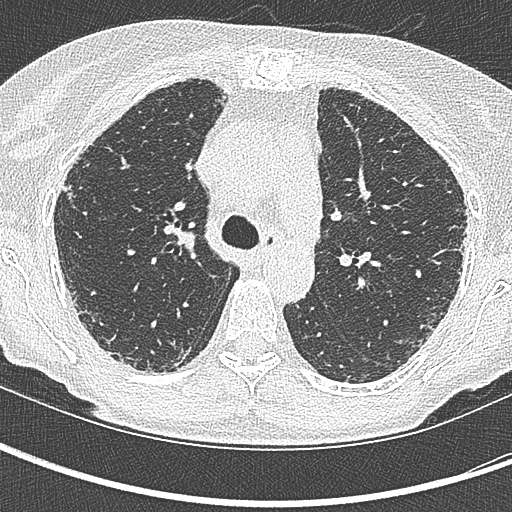
[im 181/241  lung]
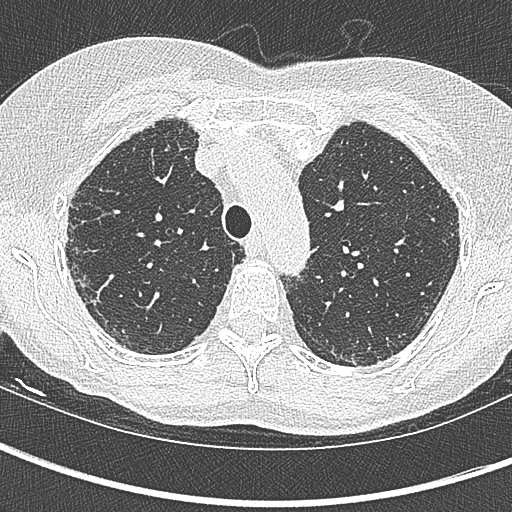
[im 201/241  mediastinal]
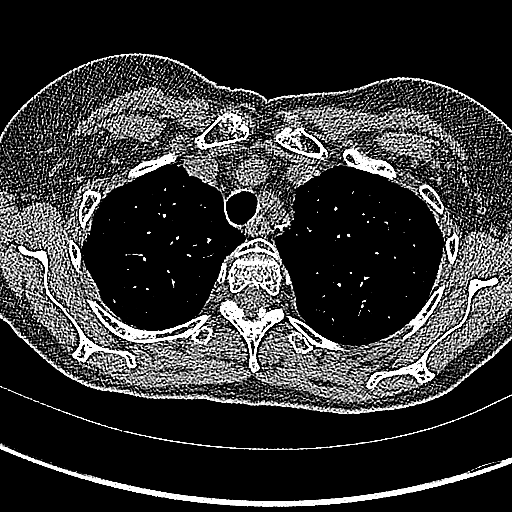
[im 201/241  lung]
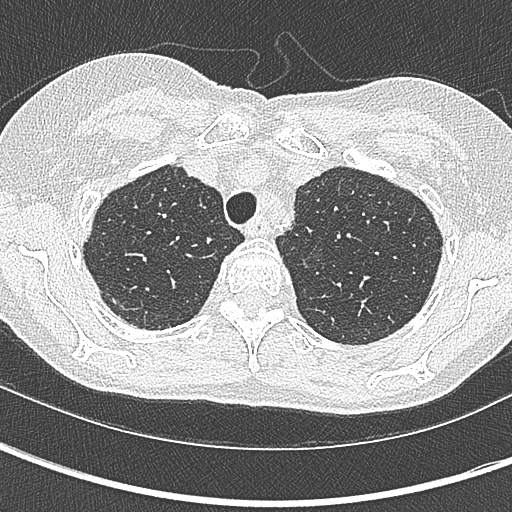
[im 221/241  lung]
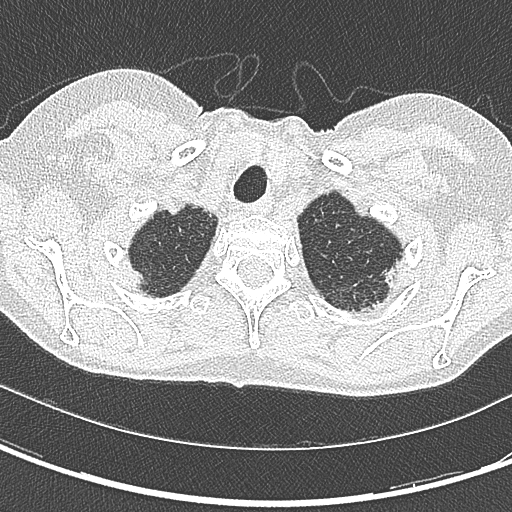

[Series 7: coronal · coronal · 0.47mm/px · 3 of 87 slices shown]
[im 18/87  lung]
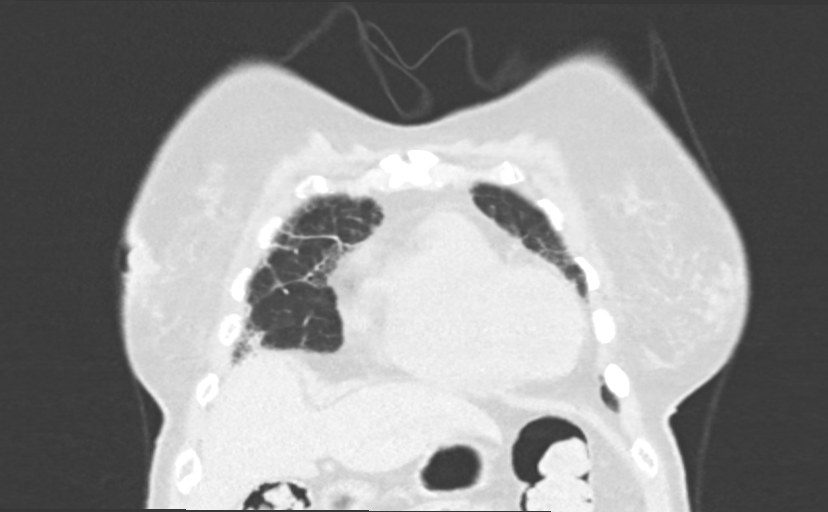
[im 35/87  lung]
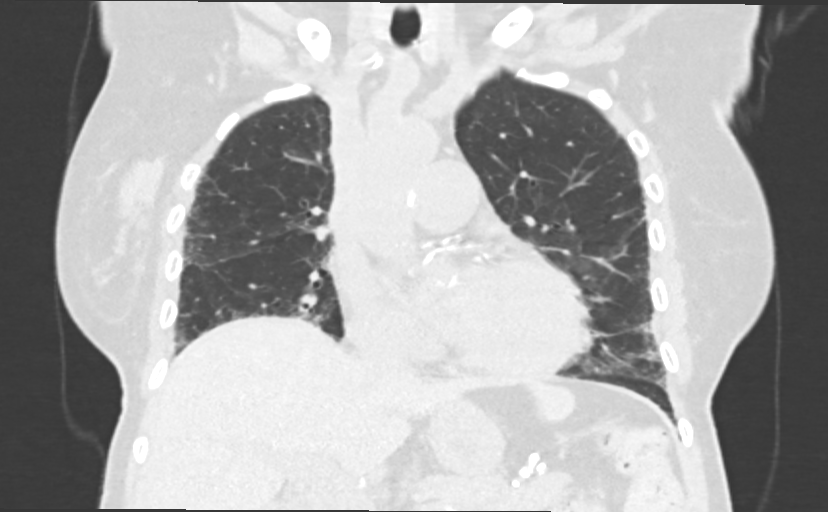
[im 52/87  lung]
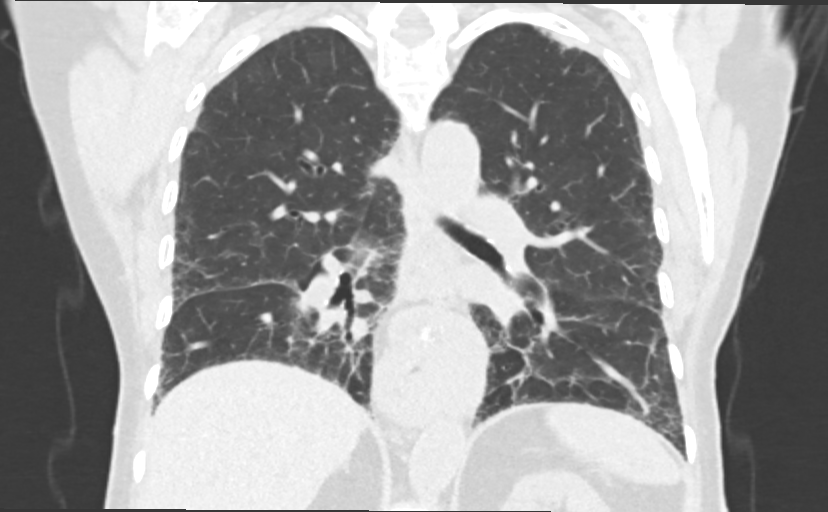

[13 of 36 positions shown; findings below may reference images not displayed]

FINDINGS: Cardiovascular: Aortic atherosclerosis. Normal heart size. Left
coronary artery calcifications and stents. No pericardial effusion.

Mediastinum/Nodes: No enlarged mediastinal, hilar, or axillary lymph
nodes. Moderate hiatal hernia with intrathoracic position of the
gastric fundus. Thyroid gland, trachea, and esophagus demonstrate no
significant findings.

Lungs/Pleura: Previously seen extensive bilateral acute airspace
opacity is resolved. Underlying residual of mild to moderate
pulmonary fibrosis, featuring irregular peripheral interstitial
opacity, septal thickening, small areas of traction bronchiectasis
and subpleural bronchiolectasis in the lung bases, without clear
evidence of honeycombing. No significant air trapping on expiratory
phase imaging. No pleural effusion or pneumothorax.

Upper Abdomen: No acute abnormality.

Musculoskeletal: No chest wall abnormality. No suspicious osseous
lesions identified. Unchanged wedge deformity of T11.
IMPRESSION: 1. Previously seen extensive bilateral acute airspace disease is
resolved. Underlying residual of mild to moderate pulmonary
fibrosis, featuring irregular peripheral interstitial opacity,
septal thickening, small areas of traction bronchiectasis and
subpleural bronchiolectasis in the lung bases, without clear
evidence of honeycombing. Findings are categorized as probable UIP
per consensus guidelines and would generally be in keeping with
rheumatoid related interstitial lung disease: Diagnosis of
Idiopathic Pulmonary Fibrosis: An Official ATS/ERS/JRS/ALAT Clinical
Practice Guideline. Am J Respir Crit Care Med Vol 198, Naelah 5,
ppe22-e[DATE].
2. Coronary artery disease.
3. Moderate hiatal hernia.

Aortic Atherosclerosis ([id number]).

## 2023-09-20 ENCOUNTER — Other Ambulatory Visit: Payer: Self-pay

## 2023-09-20 DIAGNOSIS — M05722 Rheumatoid arthritis with rheumatoid factor of left elbow without organ or systems involvement: Principal | ICD-10-CM

## 2023-09-20 DIAGNOSIS — M0579 Rheumatoid arthritis with rheumatoid factor of multiple sites without organ or systems involvement: Principal | ICD-10-CM

## 2023-09-20 DIAGNOSIS — M05741 Rheumatoid arthritis with rheumatoid factor of right hand without organ or systems involvement: Principal | ICD-10-CM

## 2023-09-20 DIAGNOSIS — M05721 Rheumatoid arthritis with rheumatoid factor of right elbow without organ or systems involvement: Principal | ICD-10-CM

## 2023-09-20 DIAGNOSIS — M059 Rheumatoid arthritis with rheumatoid factor, unspecified: Principal | ICD-10-CM

## 2023-09-20 DIAGNOSIS — M05742 Rheumatoid arthritis with rheumatoid factor of left hand without organ or systems involvement: Principal | ICD-10-CM

## 2023-09-20 DIAGNOSIS — M8589 Other specified disorders of bone density and structure, multiple sites: Principal | ICD-10-CM

## 2023-09-20 MED ORDER — PREDNISONE 5 MG TABLET
ORAL_TABLET | ORAL | 6 refills | 0.00000 days | Status: CP
Start: 2023-09-20 — End: ?

## 2023-09-20 MED ORDER — CARVEDILOL 3.125 MG PO TABS: 3.1250 mg | ORAL_TABLET | Freq: Two times a day (BID) | ORAL | 2 refills | Status: DC

## 2023-09-20 NOTE — Unmapped (Signed)
 Prednisone refill  Last ov: 07/09/2023  Next ov: 11/14/2023

## 2023-09-26 MED FILL — BD TUBERCULIN SYRINGE 1 ML 27 X 1/2": 84 days supply | Qty: 12 | Fill #2

## 2023-10-17 MED FILL — METHOTREXATE SODIUM (CONTAINS PRESERVATIVES) 25 MG/ML INJECTION SOLUTION: SUBCUTANEOUS | 84 days supply | Qty: 12 | Fill #1

## 2023-10-17 MED FILL — LEUCOVORIN CALCIUM 5 MG TABLET: ORAL | 84 days supply | Qty: 36 | Fill #2

## 2023-10-27 DIAGNOSIS — M8589 Other specified disorders of bone density and structure, multiple sites: Principal | ICD-10-CM

## 2023-10-27 DIAGNOSIS — M059 Rheumatoid arthritis with rheumatoid factor, unspecified: Principal | ICD-10-CM

## 2023-10-27 DIAGNOSIS — M0579 Rheumatoid arthritis with rheumatoid factor of multiple sites without organ or systems involvement: Principal | ICD-10-CM

## 2023-10-27 DIAGNOSIS — M05741 Rheumatoid arthritis with rheumatoid factor of right hand without organ or systems involvement: Principal | ICD-10-CM

## 2023-10-27 DIAGNOSIS — M05742 Rheumatoid arthritis with rheumatoid factor of left hand without organ or systems involvement: Principal | ICD-10-CM

## 2023-10-27 MED ORDER — METHOTREXATE SODIUM (CONTAINS PRESERVATIVES) 25 MG/ML INJECTION SOLUTION
SUBCUTANEOUS | 1 refills | 0.00000 days
Start: 2023-10-27 — End: ?

## 2023-10-28 MED ORDER — METHOTREXATE SODIUM (CONTAINS PRESERVATIVES) 25 MG/ML INJECTION SOLUTION
SUBCUTANEOUS | 1 refills | 84.00000 days | Status: CP
Start: 2023-10-28 — End: ?

## 2023-10-28 NOTE — Unmapped (Signed)
 Methotrexate  refill  Last Visit Date: 07/09/2023  Next Visit Date: 11/14/2023    Lab Results   Component Value Date    ALT 11 07/09/2023    AST 24 07/09/2023    ALBUMIN 3.6 07/09/2023    CREATININE 0.84 07/09/2023     Lab Results   Component Value Date    WBC 8.1 07/09/2023    HGB 10.1 (L) 07/09/2023    HCT 29.6 (L) 07/09/2023    PLT 233 07/09/2023     Lab Results   Component Value Date    NEUTROPCT 59.2 07/09/2023    LYMPHOPCT 26.4 07/09/2023    MONOPCT 12.2 07/09/2023    EOSPCT 1.8 07/09/2023    BASOPCT 0.4 07/09/2023

## 2023-10-31 ENCOUNTER — Ambulatory Visit: Payer: Self-pay | Admitting: Emergency Medicine

## 2023-10-31 VITALS — Ht 67.0 in | Wt 160.0 lb

## 2023-10-31 DIAGNOSIS — Z0000 Encounter for general adult medical examination without abnormal findings: Secondary | ICD-10-CM | POA: Diagnosis not present

## 2023-10-31 DIAGNOSIS — Z1211 Encounter for screening for malignant neoplasm of colon: Secondary | ICD-10-CM

## 2023-10-31 NOTE — Progress Notes (Signed)
 Subjective:   Veronica Romero is a 73 y.o. who presents for a Medicare Wellness preventive visit.  As a reminder, Annual Wellness Visits don't include a physical exam, and some assessments may be limited, especially if this visit is performed virtually. We may recommend an in-person follow-up visit with your provider if needed.  Visit Complete: Virtual I connected with  Veronica Romero on 10/31/23 by a audio enabled telemedicine application and verified that I am speaking with the correct person using two identifiers.  Patient Location: Home  Provider Location: Home Office  I discussed the limitations of evaluation and management by telemedicine. The patient expressed understanding and agreed to proceed.  Vital Signs: Because this visit was a virtual/telehealth visit, some criteria may be missing or patient reported. Any vitals not documented were not able to be obtained and vitals that have been documented are patient reported.  VideoDeclined- This patient declined Librarian, academic. Therefore the visit was completed with audio only.  Persons Participating in Visit: Patient.  AWV Questionnaire: No: Patient Medicare AWV questionnaire was not completed prior to this visit.  Cardiac Risk Factors include: advanced age (>46men, >54 women);dyslipidemia;Other (see comment), Risk factor comments: CAD     Objective:    Today's Vitals   10/31/23 0840  Weight: 160 lb (72.6 kg)  Height: 5' 7 (1.702 m)   Body mass index is 25.06 kg/m.     10/31/2023    8:58 AM 10/22/2022   11:36 AM 10/19/2021   11:36 AM 10/06/2021    1:15 PM 07/17/2021    6:35 AM 06/20/2021    2:29 AM 06/12/2021    1:33 AM  Advanced Directives  Does Patient Have a Medical Advance Directive? No No No Yes Yes  No  Type of Aeronautical engineer of [place];Living will Living will    Does patient want to make changes to medical advance directive?     No - Patient declined     Would patient like information on creating a medical advance directive? Yes (MAU/Ambulatory/Procedural Areas - Information given) No - Patient declined No - Patient declined  No - Patient declined No - Patient declined     Current Medications (verified) Outpatient Encounter Medications as of 10/31/2023  Medication Sig   Adalimumab (HUMIRA PEN) 40 MG/0.4ML PNKT Inject 0.4 mLs into the skin every Tuesday. Tuesday   aspirin  EC 81 MG tablet Take 1 tablet (81 mg total) by mouth daily. Swallow whole.   carvedilol  (COREG ) 3.125 MG tablet Take 1 tablet (3.125 mg total) by mouth 2 (two) times daily with a meal.   Cholecalciferol  (VITAMIN D3 PO) Take 1 tablet by mouth daily.   clopidogrel  (PLAVIX ) 75 MG tablet Take 1 tablet (75 mg total) by mouth daily.   cyanocobalamin (VITAMIN B12) 1000 MCG tablet Take 1,000 mcg by mouth daily.   diclofenac Sodium (VOLTAREN) 1 % GEL Apply 2 g topically. OTC   folic acid  (FOLVITE ) 800 MCG tablet Take 800 mcg by mouth daily.   furosemide  (LASIX ) 40 MG tablet Take 1 tablet (40 mg total) by mouth once a week.   leucovorin (WELLCOVORIN) 5 MG tablet Take 15 mg by mouth every Saturday.   methotrexate  50 MG/2ML injection Inject 1 mL (25 mg total) into the muscle every Saturday. Hold methotrexate  for 2 more weeks and then resume 3/10   Multiple Vitamin (MULTIVITAMIN WITH MINERALS) TABS tablet Take 1 tablet by mouth daily.   nitroGLYCERIN  (NITROSTAT ) 0.4 MG SL  tablet Place 1 tablet (0.4 mg total) under the tongue every 5 (five) minutes as needed.   Polyethyl Glycol-Propyl Glycol (LUBRICANT EYE DROPS) 0.4-0.3 % SOLN Place 1 drop into both eyes 3 (three) times daily as needed (dry/irritated eyes.).   predniSONE  (DELTASONE ) 5 MG tablet Take 10 mg by mouth in the morning. (Patient taking differently: Take 10 mg by mouth in the morning. Alternating 5mg  and 10mg  daily)   rosuvastatin  (CRESTOR ) 40 MG tablet Take 1 tablet (40 mg total) by mouth daily.   spironolactone  (ALDACTONE ) 25 MG  tablet Take 0.5 tablets (12.5 mg total) by mouth daily. Take additional Tablet 12.5 mg for swelling   traMADol  (ULTRAM ) 50 MG tablet Take 50 mg by mouth every 6 (six) hours as needed (pain.).   sacubitril-valsartan (ENTRESTO ) 24-26 MG Take 1 tablet by mouth 2 (two) times daily. (Patient not taking: Reported on 10/31/2023)   No facility-administered encounter medications on file as of 10/31/2023.    Allergies (verified) Penicillins   History: Past Medical History:  Diagnosis Date   Anemia    CHF (congestive heart failure) (HCC)    Coronary artery disease    Diabetes mellitus without complication (HCC)    Hypertension    ILD (interstitial lung disease) (HCC)    Ischemic cardiomyopathy 06/12/2021   EF 25-30%   Pulmonary HTN (HCC)    Rheumatoid arthritis (HCC)    Past Surgical History:  Procedure Laterality Date   BREAST BIOPSY Left    ?when//benign   CORONARY ANGIOPLASTY     CORONARY ATHERECTOMY N/A 07/17/2021   Procedure: CORONARY ATHERECTOMY;  Surgeon: [place], Peter M, MD;  Location: [facility] INVASIVE CV LAB;  Service: Cardiovascular;  Laterality: N/A;   RIGHT/LEFT HEART CATH AND CORONARY ANGIOGRAPHY N/A 06/16/2021   Procedure: RIGHT/LEFT HEART CATH AND CORONARY ANGIOGRAPHY;  Surgeon: Claudene Victory ORN, MD;  Location: [facility] INVASIVE CV LAB;  Service: Cardiovascular;  Laterality: N/A;   Family History  Problem Relation Age of Onset   Healthy Mother    Heart disease Father    Brain cancer Father    Kidney disease Brother    Autoimmune disease Daughter    Social History   Socioeconomic History   Marital status: Widowed    Spouse name: Not on file   Number of children: 1   Years of education: Not on file   Highest education level: Some college, no degree  Occupational History   Occupation: retired  Tobacco Use   Smoking status: Never   Smokeless tobacco: Never  Vaping Use   Vaping status: Never Used  Substance and Sexual Activity   Alcohol  use: Yes    Alcohol /week: 2.0 standard  drinks of alcohol     Types: 2 Glasses of wine per week    Comment: 1 glass twice a week   Drug use: Never   Sexual activity: Not Currently  Other Topics Concern   Not on file  Social History Narrative   Not on file   Social Drivers of Health   Financial Resource Strain: Low Risk  (10/31/2023)   Overall Financial Resource Strain (CARDIA)    Difficulty of Paying Living Expenses: Not hard at all  Food Insecurity: No Food Insecurity (10/31/2023)   Hunger Vital Sign    Worried About Running Out of Food in the Last Year: Never true    Ran Out of Food in the Last Year: Never true  Transportation Needs: No Transportation Needs (10/31/2023)   PRAPARE - Administrator, Civil Service (Medical):  No    Lack of Transportation (Non-Medical): No  Physical Activity: Insufficiently Active (10/31/2023)   Exercise Vital Sign    Days of Exercise per Week: 7 days    Minutes of Exercise per Session: 20 min  Stress: No Stress Concern Present (10/31/2023)   [employer] of Occupational Health - Occupational Stress Questionnaire    Feeling of Stress: Not at all  Social Connections: Socially Isolated (10/31/2023)   Social Connection and Isolation Panel    Frequency of Communication with Friends and Family: More than three times a week    Frequency of Social Gatherings with Friends and Family: More than three times a week    Attends Religious Services: Never    Database administrator or Organizations: No    Attends Banker Meetings: Never    Marital Status: Widowed    Tobacco Counseling Counseling given: Not Answered    Clinical Intake:  Pre-visit preparation completed: Yes  Pain : No/denies pain     BMI - recorded: 25.06 Nutritional Status: BMI 25 -29 Overweight Nutritional Risks: None Diabetes: No  Lab Results  Component Value Date   HGBA1C 5.4 06/12/2021     How often do you need to have someone help you when you read instructions, pamphlets, or other written  materials from your doctor or pharmacy?: 1 - Never  Interpreter Needed?: No  Information entered by :: Vina Ned, CMA   Activities of Daily Living     10/31/2023    8:43 AM  In your present state of health, do you have any difficulty performing the following activities:  Hearing? 0  Vision? 0  Difficulty concentrating or making decisions? 0  Walking or climbing stairs? 1  Comment stairs can be difficult, and uses cane when walking long distances  Dressing or bathing? 0  Doing errands, shopping? 0  Preparing Food and eating ? N  Using the Toilet? N  In the past six months, have you accidently leaked urine? Y  Comment wears small pad  Do you have problems with loss of bowel control? N  Managing your Medications? N  Managing your Finances? N  Housekeeping or managing your Housekeeping? N    Patient Care Team: Melvin Pao, NP as PCP - General (Nurse Practitioner) Sheena Pugh, DO as Consulting Physician (Cardiology) Thais Real BROCKS, MD (Rheumatology) Tanda Cooper Gallo, PA-C (Rheumatology)  I have updated your Care Teams any recent Medical Services you may have received from other providers in the past year.     Assessment:   This is a routine wellness examination for Veronica Romero.  Hearing/Vision screen Hearing Screening - Comments:: Denies hearing loss Vision Screening - Comments:: Needs routine eye exams, Included list of eye doctors in [place]   Goals Addressed               This Visit's Progress     Increase physical activity (pt-stated)         Depression Screen     10/31/2023    8:55 AM 08/08/2023    2:17 PM 06/12/2023    3:26 PM 01/18/2023   11:59 AM 10/22/2022   11:33 AM 07/18/2022    9:40 AM 02/22/2022    3:46 PM  PHQ 2/9 Scores  PHQ - 2 Score 1 1 0 2 0 2 3  PHQ- 9 Score 3 5 5 7  0 7 9    Fall Risk     10/31/2023    9:00 AM 08/08/2023    2:16 PM  10/22/2022   11:36 AM 07/18/2022    9:39 AM 01/29/2022    9:47 AM  Fall Risk   Falls in the  past year? 0 0 0 0 0  Number falls in past yr: 0 0 0 0 0  Injury with Fall? 0 0 0 0 0  Risk for fall due to : Impaired balance/gait;Impaired mobility No Fall Risks No Fall Risks No Fall Risks   Follow up Falls evaluation completed;Education provided Falls evaluation completed Falls prevention discussed;Falls evaluation completed Falls evaluation completed Falls evaluation completed      Data saved with a previous flowsheet row definition    MEDICARE RISK AT HOME:  Medicare Risk at Home Any stairs in or around the home?: Yes If so, are there any without handrails?: No Home free of loose throw rugs in walkways, pet beds, electrical cords, etc?: Yes Adequate lighting in your home to reduce risk of falls?: Yes Life alert?: No Use of a cane, walker or w/c?: Yes (uses cane when walking long distances) Grab bars in the bathroom?: Yes Shower chair or bench in shower?: No Elevated toilet seat or a handicapped toilet?: Yes  TIMED UP AND GO:  Was the test performed?  No  Cognitive Function: 6CIT completed        10/31/2023    9:01 AM 10/22/2022   11:41 AM 10/19/2021   11:38 AM  6CIT Screen  What Year? 0 points 0 points 0 points  What month? 0 points 0 points 0 points  What time? 0 points 0 points 0 points  Count back from 20 0 points 0 points 0 points  Months in reverse 0 points 0 points 0 points  Repeat phrase 0 points 0 points 0 points  Total Score 0 points 0 points 0 points    Immunizations Immunization History  Administered Date(s) Administered   Fluad Trivalent(High Dose 65+) 01/18/2023   Influenza,inj,Quad PF,6+ Mos 01/21/2014, 02/01/2015, 02/09/2016, 02/21/2017, 02/13/2018, 01/04/2022   Influenza-Unspecified 03/11/2019, 02/10/2020, 03/02/2021   Moderna Covid-19 Fall Seasonal Vaccine 41yrs & older 05/23/2022   PFIZER Comirnaty(Gray Top)Covid-19 Tri-Sucrose Vaccine 10/13/2020   PFIZER(Purple Top)SARS-COV-2 Vaccination 06/25/2019, 07/16/2019, 03/01/2020   PNEUMOCOCCAL  CONJUGATE-20 03/06/2023   Pfizer(Comirnaty)Fall Seasonal Vaccine 12 years and older 01/18/2023   Pneumococcal Conjugate-13 01/27/2015   Pneumococcal Polysaccharide-23 02/09/2016    Screening Tests Health Maintenance  Topic Date Due   DTaP/Tdap/Td (1 - Tdap) Never done   Zoster Vaccines- Shingrix (1 of 2) Never done   Fecal DNA (Cologuard)  Never done   MAMMOGRAM  02/16/2023   COVID-19 Vaccine (7 - Pfizer risk 2024-25 season) 07/18/2023   INFLUENZA VACCINE  11/29/2023   Diabetic kidney evaluation - eGFR measurement  06/11/2024   Diabetic kidney evaluation - Urine ACR  06/11/2024   Medicare Annual Wellness (AWV)  10/30/2024   DEXA SCAN  07/28/2026   Pneumococcal Vaccine: 50+ Years  Completed   Hepatitis C Screening  Completed   Hepatitis B Vaccines  Aged Out   HPV VACCINES  Aged Out   Meningococcal B Vaccine  Aged Out    Health Maintenance  Health Maintenance Due  Topic Date Due   DTaP/Tdap/Td (1 - Tdap) Never done   Zoster Vaccines- Shingrix (1 of 2) Never done   Fecal DNA (Cologuard)  Never done   MAMMOGRAM  02/16/2023   COVID-19 Vaccine (7 - Pfizer risk 2024-25 season) 07/18/2023   Health Maintenance Items Addressed: Cologuard Ordered, See Nurse Notes at the end of this note  Additional Screening:  Vision Screening: Recommended annual ophthalmology exams for early detection of glaucoma and other disorders of the eye. Would you like a referral to an eye doctor? No    Dental Screening: Recommended annual dental exams for proper oral hygiene  Community Resource Referral / Chronic Care Management: CRR required this visit?  No   CCM required this visit?  No   Plan:    I have personally reviewed and noted the following in the patient's chart:   Medical and social history Use of alcohol , tobacco or illicit drugs  Current medications and supplements including opioid prescriptions. Patient is currently taking opioid prescriptions. Information provided to patient  regarding non-opioid alternatives. Patient advised to discuss non-opioid treatment plan with their provider. Functional ability and status Nutritional status Physical activity Advanced directives List of other physicians Hospitalizations, surgeries, and ER visits in previous 12 months Vitals Screenings to include cognitive, depression, and falls Referrals and appointments  In addition, I have reviewed and discussed with patient certain preventive protocols, quality metrics, and best practice recommendations. A written personalized care plan for preventive services as well as general preventive health recommendations were provided to patient.   Vina Ned, CMA   10/31/2023   After Visit Summary: (MyChart) Due to this being a telephonic visit, the after visit summary with patients personalized plan was offered to patient via MyChart   Notes:  Declined colonoscopy. Placed order for cologuard Declined MMG at this time Needs Shingtrix and Tdap vaccines (pharmacy) Covid and flu vaccines in the fall Needs routine eye exam. Included list of eye doctors in [place]

## 2023-10-31 NOTE — Patient Instructions (Signed)
 Veronica Romero , Thank you for taking time out of your busy schedule to complete your Annual Wellness Visit with me. I enjoyed our conversation and look forward to speaking with you again next year. I, as well as your care team,  appreciate your ongoing commitment to your health goals. Please review the following plan we discussed and let me know if I can assist you in the future. Your Game plan/ To Do List    Referrals: None  Follow up Visits: Next Medicare AWV with our clinical staff: 11/12/24 @ 8:40am (PHONE VISIT)   Have you seen your provider in the last 6 months (3 months if uncontrolled diabetes)? Yes Next Office Visit with your provider: 12/17/23 @ 1:40pm with Darice Petty, NP  Clinician Recommendations:  Aim for 30 minutes of exercise or brisk walking, 6-8 glasses of water, and 5 servings of fruits and vegetables each day. An order has been placed for a Cologuard for you. They will mail you the kit with instructions on how to obtain the sample and send it back in to be tested. If you do not received your kit, please call our office and let us  know.  Get the Shingles and tetanus vaccines at your local pharmacy. Get Covid and flu vaccines in the fall. Get a routine eye exam. I have included a list of eye doctors in the area.      This is a list of the screening recommended for you and due dates:  Health Maintenance  Topic Date Due   DTaP/Tdap/Td vaccine (1 - Tdap) Never done   Zoster (Shingles) Vaccine (1 of 2) Never done   Cologuard (Stool DNA test)  Never done   Mammogram  02/16/2023   COVID-19 Vaccine (7 - Pfizer risk 2024-25 season) 07/18/2023   Flu Shot  11/29/2023   Yearly kidney function blood test for diabetes  06/11/2024   Yearly kidney health urinalysis for diabetes  06/11/2024   Medicare Annual Wellness Visit  10/30/2024   DEXA scan (bone density measurement)  07/28/2026   Pneumococcal Vaccine for age over 23  Completed   Hepatitis C Screening  Completed   Hepatitis B  Vaccine  Aged Out   HPV Vaccine  Aged Out   Meningitis B Vaccine  Aged Out    Advanced directives: (ACP Link)Information on Advanced Care Planning can be found at [place]  Best boy Advance Health Care Directives Advance Health Care Directives. [email or web address] You may also get the forms at your doctors office. Advance Care Planning is important because it:  [x]  Makes sure you receive the medical care that is consistent with your values, goals, and preferences  [x]  It provides guidance to your family and loved ones and reduces their decisional burden about whether or not they are making the right decisions based on your wishes.  Follow the link provided in your after visit summary or read over the paperwork we have mailed to you to help you started getting your Advance Directives in place. If you need assistance in completing these, please reach out to us  so that we can help you!  See attachments for Preventive Care and Fall Prevention Tips.   There are several Eye Doctors in your area. Here are a few that usually accept all insurance types:  [facility] [address] [place], [place] [address] Phone: [phone]  [facility] [address] [place], [place] [address] Phone: [phone]  [facility] [id number]  [address] [place], [place] [address] Phone: [phone]  [facility] [address] [facility] [place], [place] [address] Phone: [phone]  [facility] [address] [place], [place] [address] Phone: [phone]  Please let us  know if you require a referral for an eye exam appointment. Thank you!   Fall Prevention in the Home, Adult Falls can cause injuries and affect people of all ages. There are many simple things that you can do to make your home safe and to help prevent falls. If you need it, ask for help making these changes. What actions can I take to prevent falls? General information Use good lighting in all  rooms. Make sure to: Replace any light bulbs that burn out. Turn on lights if it is dark and use night-lights. Keep items that you use often in easy-to-reach places. Lower the shelves around your home if needed. Move furniture so that there are clear paths around it. Do not keep throw rugs or other things on the floor that can make you trip. If any of your floors are uneven, fix them. Add color or contrast paint or tape to clearly mark and help you see: Grab bars or handrails. First and last steps of staircases. Where the edge of each step is. If you use a ladder or stepladder: Make sure that it is fully opened. Do not climb a closed ladder. Make sure the sides of the ladder are locked in place. Have someone hold the ladder while you use it. Know where your pets are as you move through your home. What can I do in the bathroom?     Keep the floor dry. Clean up any water that is on the floor right away. Remove soap buildup in the bathtub or shower. Buildup makes bathtubs and showers slippery. Use non-skid mats or decals on the floor of the bathtub or shower. Attach bath mats securely with double-sided, non-slip rug tape. If you need to sit down while you are in the shower, use a non-slip stool. Install grab bars by the toilet and in the bathtub and shower. Do not use towel bars as grab bars. What can I do in the bedroom? Make sure that you have a light by your bed that is easy to reach. Do not use any sheets or blankets on your bed that hang to the floor. Have a firm bench or chair with side arms that you can use for support when you get dressed. What can I do in the kitchen? Clean up any spills right away. If you need to reach something above you, use a sturdy step stool that has a grab bar. Keep electrical cables out of the way. Do not use floor polish or wax that makes floors slippery. What can I do with my stairs? Do not leave anything on the stairs. Make sure that you have a  light switch at the top and the bottom of the stairs. Have them installed if you do not have them. Make sure that there are handrails on both sides of the stairs. Fix handrails that are broken or loose. Make sure that handrails are as long as the staircases. Install non-slip stair treads on all stairs in your home if they do not have carpet. Avoid having throw rugs at the top or bottom of stairs, or secure the rugs with carpet tape to prevent them from moving. Choose a carpet design that does not hide the edge of steps on the stairs. Make sure  that carpet is firmly attached to the stairs. Fix any carpet that is loose or worn. What can I do on the outside of my home? Use bright outdoor lighting. Repair the edges of walkways and driveways and fix any cracks. Clear paths of anything that can make you trip, such as tools or rocks. Add color or contrast paint or tape to clearly mark and help you see high doorway thresholds. Trim any bushes or trees on the main path into your home. Check that handrails are securely fastened and in good repair. Both sides of all steps should have handrails. Install guardrails along the edges of any raised decks or porches. Have leaves, snow, and ice cleared regularly. Use sand, salt, or ice melt on walkways during winter months if you live where there is ice and snow. In the garage, clean up any spills right away, including grease or oil spills. What other actions can I take? Review your medicines with your health care provider. Some medicines can make you confused or feel dizzy. This can increase your chance of falling. Wear closed-toe shoes that fit well and support your feet. Wear shoes that have rubber soles and low heels. Use a cane, walker, scooter, or crutches that help you move around if needed. Talk with your provider about other ways that you can decrease your risk of falls. This may include seeing a physical therapist to learn to do exercises to improve  movement and strength. Where to find more information Centers for Disease Control and Prevention, STEADI: TonerPromos.no [employer] on Aging: BaseRingTones.pl National Institute on Aging: BaseRingTones.pl Contact a health care provider if: You are afraid of falling at home. You feel weak, drowsy, or dizzy at home. You fall at home. Get help right away if you: Lose consciousness or have trouble moving after a fall. Have a fall that causes a head injury. These symptoms may be an emergency. Get help right away. Call 911. Do not wait to see if the symptoms will go away. Do not drive yourself to the hospital. This information is not intended to replace advice given to you by your health care provider. Make sure you discuss any questions you have with your health care provider. Document Revised: 12/18/2021 Document Reviewed: 12/18/2021 Elsevier Patient Education  2024 Elsevier Inc.  Managing Pain Without Opioids Opioids are strong medicines used to treat moderate to severe pain. For some people, especially those who have long-term (chronic) pain, opioids may not be the best choice for pain management due to: Side effects like nausea, constipation, and sleepiness. The risk of addiction (opioid use disorder). The longer you take opioids, the greater your risk of addiction. Pain that lasts for more than 3 months is called chronic pain. Managing chronic pain usually requires more than one approach and is often provided by a team of health care providers working together (multidisciplinary approach). Pain management may be done at a pain management center or pain clinic. How to manage pain without the use of opioids Use non-opioid medicines Non-opioid medicines for pain may include: Over-the-counter or prescription non-steroidal anti-inflammatory drugs (NSAIDs). These may be the first medicines used for pain. They work well for muscle and bone pain, and they reduce swelling. Acetaminophen . This  over-the-counter medicine may work well for milder pain but not swelling. Antidepressants. These may be used to treat chronic pain. A certain type of antidepressant (tricyclics) is often used. These medicines are given in lower doses for pain than when used for depression. Anticonvulsants. These are  usually used to treat seizures but may also reduce nerve (neuropathic) pain. Muscle relaxants. These relieve pain caused by sudden muscle tightening (spasms). You may also use a pain medicine that is applied to the skin as a patch, cream, or gel (topical analgesic), such as a numbing medicine. These may cause fewer side effects than medicines taken by mouth. Do certain therapies as directed Some therapies can help with pain management. They include: Physical therapy. You will do exercises to gain strength and flexibility. A physical therapist may teach you exercises to move and stretch parts of your body that are weak, stiff, or painful. You can learn these exercises at physical therapy visits and practice them at home. Physical therapy may also involve: Massage. Heat wraps or applying heat or cold to affected areas. Electrical signals that interrupt pain signals (transcutaneous electrical nerve stimulation, TENS). Weak lasers that reduce pain and swelling (low-level laser therapy). Signals from your body that help you learn to regulate pain (biofeedback). Occupational therapy. This helps you to learn ways to function at home and work with less pain. Recreational therapy. This involves trying new activities or hobbies, such as a physical activity or drawing. Mental health therapy, including: Cognitive behavioral therapy (CBT). This helps you learn coping skills for dealing with pain. Acceptance and commitment therapy (ACT) to change the way you think and react to pain. Relaxation therapies, including muscle relaxation exercises and mindfulness-based stress reduction. Pain management counseling. This  may be individual, family, or group counseling.  Receive medical treatments Medical treatments for pain management include: Nerve block injections. These may include a pain blocker and anti-inflammatory medicines. You may have injections: Near the spine to relieve chronic back or neck pain. Into joints to relieve back or joint pain. Into nerve areas that supply a painful area to relieve body pain. Into muscles (trigger point injections) to relieve some painful muscle conditions. A medical device placed near your spine to help block pain signals and relieve nerve pain or chronic back pain (spinal cord stimulation device). Acupuncture. Follow these instructions at home Medicines Take over-the-counter and prescription medicines only as told by your health care provider. If you are taking pain medicine, ask your health care providers about possible side effects to watch out for. Do not drive or use heavy machinery while taking prescription opioid pain medicine. Lifestyle  Do not use drugs or alcohol  to reduce pain. If you drink alcohol , limit how much you have to: 0-1 drink a day for women who are not pregnant. 0-2 drinks a day for men. Know how much alcohol  is in a drink. In the [place] one drink equals one 12 oz bottle of beer (355 mL), one 5 oz glass of wine (148 mL), or one 1 oz glass of hard liquor (44 mL). Do not use any products that contain nicotine or tobacco. These products include cigarettes, chewing tobacco, and vaping devices, such as e-cigarettes. If you need help quitting, ask your health care provider. Eat a healthy diet and maintain a healthy weight. Poor diet and excess weight may make pain worse. Eat foods that are high in fiber. These include fresh fruits and vegetables, whole grains, and beans. Limit foods that are high in fat and processed sugars, such as fried and sweet foods. Exercise regularly. Exercise lowers stress and may help relieve pain. Ask your health care  provider what activities and exercises are safe for you. If your health care provider approves, join an exercise class that combines movement and stress reduction.  Examples include yoga and tai chi. Get enough sleep. Lack of sleep may make pain worse. Lower stress as much as possible. Practice stress reduction techniques as told by your therapist. General instructions Work with all your pain management providers to find the treatments that work best for you. You are an important member of your pain management team. There are many things you can do to reduce pain on your own. Consider joining an online or in-person support group for people who have chronic pain. Keep all follow-up visits. This is important. Where to find more information You can find more information about managing pain without opioids from: American Academy of Pain Medicine: painmed.org Institute for Chronic Pain: instituteforchronicpain.org American Chronic Pain Association: theacpa.org Contact a health care provider if: You have side effects from pain medicine. Your pain gets worse or does not get better with treatments or home therapy. You are struggling with anxiety or depression. Summary Many types of pain can be managed without opioids. Chronic pain may respond better to pain management without opioids. Pain is best managed when you and a team of health care providers work together. Pain management without opioids may include non-opioid medicines, medical treatments, physical therapy, mental health therapy, and lifestyle changes. Tell your health care providers if your pain gets worse or is not being managed well enough. This information is not intended to replace advice given to you by your health care provider. Make sure you discuss any questions you have with your health care provider. Document Revised: 07/27/2020 Document Reviewed: 07/27/2020 Elsevier Patient Education  2024 [employer].

## 2023-11-07 NOTE — Unmapped (Signed)
 Humira  - last filled 08/26/23 as a 84-day supply. Previous encounter documentation reflects the patient will use their next injection on 08/27/23 and patient reported having 0 injections on hand. Rescheduling RC date because the patient should not need their next dose until 11/19/23.

## 2023-11-11 NOTE — Unmapped (Signed)
 [facility] Refill Coordination Note    Specialty Medication(s) to be Shipped:   Inflammatory Disorders: Humira     Other medication(s) to be shipped: No additional medications requested for fill at this time     Amber Stafford, DOB: 07-23-1950  Phone: [phone] (home)       All above HIPAA information was verified with patient.     Was a Nurse, learning disability used for this call? No    Completed refill call assessment today to schedule patient's medication shipment from the [facility]  [phone]).  All relevant notes have been reviewed.     Specialty medication(s) and dose(s) confirmed: Regimen is correct and unchanged.   Changes to medications: Jori reports no changes at this time.  Changes to insurance: No  New side effects reported not previously addressed with a pharmacist or physician: None reported  Questions for the pharmacist: No    Confirmed patient received a Conservation officer, historic buildings and a Surveyor, mining with first shipment. The patient will receive a drug information handout for each medication shipped and additional FDA Medication Guides as required.       DISEASE/MEDICATION-SPECIFIC INFORMATION        For patients on injectable medications: Patient currently has 1 doses left.  Next injection is scheduled for 7.15.25.    SPECIALTY MEDICATION ADHERENCE     Medication Adherence    Patient reported X missed doses in the last month: 0  Specialty Medication: HUMIRA (CF) PEN 40 mg/0.4 mL injection (adalimumab )  Patient is on additional specialty medications: No              Were doses missed due to medication being on hold? No      HUMIRA (CF) PEN 40 mg/0.4 mL injection (adalimumab ): 1 doses of medicine on hand       REFERRAL TO PHARMACIST     Referral to the pharmacist: Not needed      [facility]     Shipping address confirmed in Epic.     Cost and Payment: Patient has a $0 copay, payment information is not required.    Delivery Scheduled: Yes, Expected medication delivery date: 7.18.25.     Medication will be delivered via UPS to the prescription address in Epic WAM.    [name]   [facility]  Specialty Technician

## 2023-11-14 MED FILL — HUMIRA PEN CITRATE FREE 40 MG/0.4 ML: SUBCUTANEOUS | 84 days supply | Qty: 12 | Fill #1

## 2023-11-21 MED FILL — BD TUBERCULIN SYRINGE 1 ML 27 X 1/2": 84 days supply | Qty: 12 | Fill #3

## 2023-12-16 ENCOUNTER — Other Ambulatory Visit: Payer: Self-pay

## 2023-12-16 MED ORDER — ROSUVASTATIN CALCIUM 40 MG PO TABS: 40.0000 mg | ORAL_TABLET | Freq: Every day | ORAL | 1 refills | Status: AC

## 2023-12-17 ENCOUNTER — Ambulatory Visit (INDEPENDENT_AMBULATORY_CARE_PROVIDER_SITE_OTHER): Payer: Medicare Other | Admitting: Nurse Practitioner

## 2023-12-17 ENCOUNTER — Encounter: Payer: Self-pay | Admitting: Nurse Practitioner

## 2023-12-17 VITALS — BP 124/65 | HR 73 | Temp 98.4°F | Resp 15 | Ht 67.01 in | Wt 165.0 lb

## 2023-12-17 DIAGNOSIS — I5020 Unspecified systolic (congestive) heart failure: Secondary | ICD-10-CM

## 2023-12-17 DIAGNOSIS — E782 Mixed hyperlipidemia: Secondary | ICD-10-CM

## 2023-12-17 DIAGNOSIS — I700 Atherosclerosis of aorta: Secondary | ICD-10-CM | POA: Diagnosis not present

## 2023-12-17 DIAGNOSIS — I272 Pulmonary hypertension, unspecified: Secondary | ICD-10-CM

## 2023-12-17 DIAGNOSIS — I2510 Atherosclerotic heart disease of native coronary artery without angina pectoris: Secondary | ICD-10-CM

## 2023-12-17 DIAGNOSIS — Z1231 Encounter for screening mammogram for malignant neoplasm of breast: Secondary | ICD-10-CM

## 2023-12-17 NOTE — Assessment & Plan Note (Signed)
 Chronic. Controlled.  Doing well with current regimen.

## 2023-12-17 NOTE — Assessment & Plan Note (Signed)
 Observed on CT. Continue with ASA, Plavix and Crestor. Continue to follow up with Cardiology.  ?

## 2023-12-17 NOTE — Assessment & Plan Note (Signed)
 Chronic. Controlled. Continue Coreg , Farxiga , Entresto , spironolactone , lasix , and Plavix . Has not been taking Entresto - recommend speaking with Cardiology about restarting medication.  Hasn't seen HF clinic since 2023.  Continue to follow up with Cardiology for further advice if symptoms occur that warrant her to stop taking her medications. Labs ordered today. Follow up in 6 months. Call sooner if concerns arise.  Reviewed recent note.

## 2023-12-17 NOTE — Progress Notes (Signed)
 BP 124/65 (BP Location: Left Arm, Patient Position: Sitting, Cuff Size: Normal)   Pulse 73   Temp 98.4 F (36.9 C) (Oral)   Resp 15   Ht 5' 7.01 (1.702 m)   Wt 165 lb (74.8 kg)   SpO2 98%   BMI 25.84 kg/m    Subjective:    Patient ID: Veronica Romero, female    DOB: [birth year], 73 y.o.   MRN: [id number]  HPI: Veronica Romero is a 73 y.o. female  Chief Complaint  Patient presents with   Follow-up    HYPERTENSION / HYPERLIPIDEMIA/HEART FAILURE She is followed by HF clinic. Patient states she stopped taking her cardiac medications about two months ago due to diarrhea issues, states diarrhea resolved after she stopped taking certain medications but resumed her rheumatoid medications. She admits to dealing with an increased amount of stress lately after the loss of her husband. She is prescribed Coreg , Farxiga , Entresto , spironolactone , lasix  and Plavix . Has not been back to the HF clinic since 2023.  Seeing Cardiology in [place].  Saw them last in January.   Satisfied with current treatment? No states caused her to have diarrhea Duration of hypertension: years BP monitoring frequency: not checking BP range:  BP medication side effects: yes Past BP meds: Lasix , Carvedilol , Entresto  and Losartan  Duration of hyperlipidemia: years Cholesterol medication side effects: no Cholesterol supplements: none Past cholesterol medications: rosuvastatin  (crestor ) Medication compliance: bad compliance Aspirin : Yes Recent stressors: Yes Recurrent headaches: No Visual changes: no Palpitations: no Dyspnea: no Chest pain: no Lower extremity edema: yes bilateral ankles Dizzy/lightheaded: no   RHEUMATOID Following up with Rheumatology. Taking Humira, methotrexate , Leucovorin and Predisone. She is taking Prednisone  5 mg daily instead of 10 mg daily now.  She is taking Tramadol  from Rheumatology for pain.  Doesn't take it very often.    ALCOHOL  USE Patient is drinking 2 glasses of wine  1-2 days per week.   Relevant past medical, surgical, family and social history reviewed and updated as indicated. Interim medical history since our last visit reviewed. Allergies and medications reviewed and updated.  Review of Systems  Eyes:  Negative for visual disturbance.  Respiratory:  Negative for cough, chest tightness and shortness of breath.   Cardiovascular:  Positive for leg swelling. Negative for chest pain and palpitations.  Neurological:  Negative for dizziness, light-headedness and headaches.    Per HPI unless specifically indicated above     Objective:    BP 124/65 (BP Location: Left Arm, Patient Position: Sitting, Cuff Size: Normal)   Pulse 73   Temp 98.4 F (36.9 C) (Oral)   Resp 15   Ht 5' 7.01 (1.702 m)   Wt 165 lb (74.8 kg)   SpO2 98%   BMI 25.84 kg/m   Wt Readings from Last 3 Encounters:  12/17/23 165 lb (74.8 kg)  10/31/23 160 lb (72.6 kg)  08/08/23 162 lb (73.5 kg)    Physical Exam Vitals and nursing note reviewed.  Constitutional:      General: She is awake. She is not in acute distress.    Appearance: Normal appearance. She is well-developed, well-groomed and normal weight. She is not ill-appearing, toxic-appearing or diaphoretic.  HENT:     Head: Normocephalic and atraumatic.     Right Ear: Hearing and external ear normal. No drainage.     Left Ear: Hearing and external ear normal. No drainage.     Nose: Nose normal.     Mouth/Throat:     Mouth: Mucous  membranes are moist.     Pharynx: Oropharynx is clear.  Eyes:     General: Lids are normal.        Right eye: No discharge.        Left eye: No discharge.     Extraocular Movements: Extraocular movements intact.     Conjunctiva/sclera: Conjunctivae normal.     Pupils: Pupils are equal, round, and reactive to light.  Cardiovascular:     Rate and Rhythm: Normal rate and regular rhythm.     Pulses:          Radial pulses are 2+ on the right side and 2+ on the left side.       Posterior  tibial pulses are 2+ on the right side and 2+ on the left side.     Heart sounds: S1 normal and S2 normal. Murmur heard.     No gallop.  Pulmonary:     Effort: Pulmonary effort is normal. No accessory muscle usage or respiratory distress.     Breath sounds: Normal breath sounds. No wheezing or rales.  Musculoskeletal:        General: Deformity (due the rheumatoid on hands and feet) present. Normal range of motion.     Cervical back: Full passive range of motion without pain, normal range of motion and neck supple.     Right lower leg: 1+ Edema present.     Left lower leg: 1+ Edema present.  Skin:    General: Skin is warm and dry.     Capillary Refill: Capillary refill takes less than 2 seconds.  Neurological:     General: No focal deficit present.     Mental Status: She is alert and oriented to person, place, and time. Mental status is at baseline.  Psychiatric:        Attention and Perception: Attention normal.        Mood and Affect: Mood normal.        Speech: Speech normal.        Behavior: Behavior normal. Behavior is cooperative.        Thought Content: Thought content normal.        Judgment: Judgment normal.     Results for orders placed or performed in visit on 08/08/23  Anemia Profile B   Collection Time: 08/08/23  2:37 PM  Result Value Ref Range   Total Iron Binding Capacity 344 250 - 450 ug/dL   UIBC 736 881 - 630 ug/dL   Iron 81 27 - 860 ug/dL   Iron Saturation 24 15 - 55 %   Ferritin 58 15 - 150 ng/mL   Vitamin B-12 >2000 (H) 232 - 1245 pg/mL   Folate >20.0 >3.0 ng/mL   WBC 11.5 (H) 3.4 - 10.8 x10E3/uL   RBC 3.10 (L) 3.77 - 5.28 x10E6/uL   Hemoglobin 10.3 (L) 11.1 - 15.9 g/dL   Hematocrit 68.8 (L) 65.9 - 46.6 %   MCV 100 (H) 79 - 97 fL   MCH 33.2 (H) 26.6 - 33.0 pg   MCHC 33.1 31.5 - 35.7 g/dL   RDW 87.0 88.2 - 84.5 %   Platelets 252 150 - 450 x10E3/uL   Neutrophils 74 Not Estab. %   Lymphs 19 Not Estab. %   Monocytes 5 Not Estab. %   Eos 1 Not Estab. %    Basos 1 Not Estab. %   Neutrophils Absolute 8.5 (H) 1.4 - 7.0 x10E3/uL   Lymphocytes Absolute 2.1 0.7 - 3.1 x10E3/uL  Monocytes Absolute 0.6 0.1 - 0.9 x10E3/uL   EOS (ABSOLUTE) 0.2 0.0 - 0.4 x10E3/uL   Basophils Absolute 0.1 0.0 - 0.2 x10E3/uL   Immature Granulocytes 0 Not Estab. %   Immature Grans (Abs) 0.0 0.0 - 0.1 x10E3/uL   Retic Ct Pct 1.0 0.6 - 2.6 %      Assessment & Plan:   Problem List Items Addressed This Visit       Cardiovascular and Mediastinum   Atherosclerosis of aorta (HCC)   Observed on CT. Continue with ASA, Plavix  and Crestor . Continue to follow up with Cardiology.       Relevant Orders   CBC With Diff/Platelet   HFrEF (heart failure with reduced ejection fraction) (HCC)   Chronic. Controlled. Continue Coreg , Farxiga , Entresto , spironolactone , lasix , and Plavix . Has not been taking Entresto - recommend speaking with Cardiology about restarting medication.  Hasn't seen HF clinic since 2023.  Continue to follow up with Cardiology for further advice if symptoms occur that warrant her to stop taking her medications. Labs ordered today. Follow up in 6 months. Call sooner if concerns arise.  Reviewed recent note.       Relevant Orders   Comprehensive metabolic panel with GFR   Mild pulmonary hypertension (HCC)   Chronic. Controlled.  Doing well with current regimen.       CAD (coronary artery disease)   Observed on CT. Continue with ASA, Plavix  and Crestor . Continue to follow up with Cardiology.       Relevant Orders   CBC With Diff/Platelet     Other   Mixed hyperlipidemia - Primary   Chronic.  Controlled.  Continue with current medication regimen.  Labs ordered today.  Return to clinic in 6 months for reevaluation.  Call sooner if concerns arise.        Relevant Orders   Lipid panel   Other Visit Diagnoses       Encounter for screening mammogram for malignant neoplasm of breast       Relevant Orders   MM 3D SCREENING MAMMOGRAM BILATERAL BREAST          Follow up plan: Return in about 6 months (around 06/18/2024) for HTN, HLD, DM2 FU.

## 2023-12-17 NOTE — Assessment & Plan Note (Signed)
 Chronic.  Controlled.  Continue with current medication regimen.  Labs ordered today.  Return to clinic in 6 months for reevaluation.  Call sooner if concerns arise.  ? ?

## 2023-12-18 ENCOUNTER — Ambulatory Visit: Payer: Self-pay | Admitting: Nurse Practitioner

## 2023-12-18 DIAGNOSIS — I2510 Atherosclerotic heart disease of native coronary artery without angina pectoris: Secondary | ICD-10-CM

## 2023-12-18 LAB — CBC WITH DIFF/PLATELET
Basophils Absolute: 0.1 x10E3/uL (ref 0.0–0.2)
Basos: 1 %
EOS (ABSOLUTE): 0.2 x10E3/uL (ref 0.0–0.4)
Eos: 2 %
Hematocrit: 29.1 % — ABNORMAL LOW (ref 34.0–46.6)
Hemoglobin: 8.7 g/dL — ABNORMAL LOW (ref 11.1–15.9)
Immature Grans (Abs): 0 x10E3/uL (ref 0.0–0.1)
Immature Granulocytes: 0 %
Lymphocytes Absolute: 1.7 x10E3/uL (ref 0.7–3.1)
Lymphs: 23 %
MCH: 27.1 pg (ref 26.6–33.0)
MCHC: 29.9 g/dL — ABNORMAL LOW (ref 31.5–35.7)
MCV: 91 fL (ref 79–97)
Monocytes Absolute: 0.6 x10E3/uL (ref 0.1–0.9)
Monocytes: 8 %
Neutrophils Absolute: 5 x10E3/uL (ref 1.4–7.0)
Neutrophils: 66 %
Platelets: 320 x10E3/uL (ref 150–450)
RBC: 3.21 x10E6/uL — ABNORMAL LOW (ref 3.77–5.28)
RDW: 15.2 % (ref 11.7–15.4)
WBC: 7.5 x10E3/uL (ref 3.4–10.8)

## 2023-12-18 LAB — COMPREHENSIVE METABOLIC PANEL WITH GFR
ALT: 12 IU/L (ref 0–32)
AST: 26 IU/L (ref 0–40)
Albumin: 4.1 g/dL (ref 3.8–4.8)
Alkaline Phosphatase: 61 IU/L (ref 44–121)
BUN/Creatinine Ratio: 22 (ref 12–28)
BUN: 19 mg/dL (ref 8–27)
Bilirubin Total: 0.2 mg/dL (ref 0.0–1.2)
CO2: 22 mmol/L (ref 20–29)
Calcium: 9.4 mg/dL (ref 8.7–10.3)
Chloride: 104 mmol/L (ref 96–106)
Creatinine, Ser: 0.85 mg/dL (ref 0.57–1.00)
Globulin, Total: 3.6 g/dL (ref 1.5–4.5)
Glucose: 118 mg/dL — ABNORMAL HIGH (ref 70–99)
Potassium: 5.4 mmol/L — ABNORMAL HIGH (ref 3.5–5.2)
Sodium: 139 mmol/L (ref 134–144)
Total Protein: 7.7 g/dL (ref 6.0–8.5)
eGFR: 72 mL/min/1.73 (ref >59)

## 2023-12-18 LAB — LIPID PANEL
Chol/HDL Ratio: 2.2 ratio (ref 0.0–4.4)
Cholesterol, Total: 160 mg/dL (ref 100–199)
HDL: 74 mg/dL (ref >39)
LDL Chol Calc (NIH): 68 mg/dL (ref 0–99)
Triglycerides: 97 mg/dL (ref 0–149)
VLDL Cholesterol Cal: 18 mg/dL (ref 5–40)

## 2023-12-21 ENCOUNTER — Other Ambulatory Visit: Payer: Self-pay | Admitting: Cardiology

## 2023-12-23 ENCOUNTER — Other Ambulatory Visit: Payer: Self-pay

## 2023-12-23 MED ORDER — CLOPIDOGREL BISULFATE 75 MG PO TABS
75.0000 mg | ORAL_TABLET | Freq: Every day | ORAL | 1 refills | Status: AC
Start: 2023-12-23 — End: ?

## 2024-01-07 ENCOUNTER — Other Ambulatory Visit: Payer: Self-pay

## 2024-01-07 ENCOUNTER — Emergency Department (HOSPITAL_COMMUNITY)

## 2024-01-07 ENCOUNTER — Telehealth: Admitting: Physician Assistant

## 2024-01-07 ENCOUNTER — Encounter (HOSPITAL_COMMUNITY): Payer: Self-pay

## 2024-01-07 ENCOUNTER — Encounter: Payer: Self-pay | Admitting: Nurse Practitioner

## 2024-01-07 ENCOUNTER — Emergency Department (HOSPITAL_COMMUNITY)
Admission: EM | Admit: 2024-01-07 | Discharge: 2024-01-07 | Discharge status: Against Medical Advice | Attending: Emergency Medicine | Admitting: Emergency Medicine

## 2024-01-07 DIAGNOSIS — R42 Dizziness and giddiness: Secondary | ICD-10-CM | POA: Diagnosis not present

## 2024-01-07 DIAGNOSIS — Z5321 Procedure and treatment not carried out due to patient leaving prior to being seen by health care provider: Secondary | ICD-10-CM | POA: Diagnosis not present

## 2024-01-07 DIAGNOSIS — R0602 Shortness of breath: Secondary | ICD-10-CM

## 2024-01-07 DIAGNOSIS — I509 Heart failure, unspecified: Secondary | ICD-10-CM | POA: Diagnosis not present

## 2024-01-07 DIAGNOSIS — R531 Weakness: Secondary | ICD-10-CM | POA: Insufficient documentation

## 2024-01-07 DIAGNOSIS — D649 Anemia, unspecified: Secondary | ICD-10-CM

## 2024-01-07 DIAGNOSIS — R5383 Other fatigue: Secondary | ICD-10-CM | POA: Diagnosis not present

## 2024-01-07 DIAGNOSIS — R059 Cough, unspecified: Secondary | ICD-10-CM | POA: Insufficient documentation

## 2024-01-07 DIAGNOSIS — R0902 Hypoxemia: Secondary | ICD-10-CM

## 2024-01-07 LAB — I-STAT CHEM 8, ED
BUN: 16 mg/dL (ref 8–23)
Calcium, Ion: 1.13 mmol/L — ABNORMAL LOW (ref 1.15–1.40)
Chloride: 104 mmol/L (ref 98–111)
Creatinine, Ser: 1 mg/dL (ref 0.44–1.00)
Glucose, Bld: 112 mg/dL — ABNORMAL HIGH (ref 70–99)
HCT: 26 % — ABNORMAL LOW (ref 36.0–46.0)
Hemoglobin: 8.8 g/dL — ABNORMAL LOW (ref 12.0–15.0)
Potassium: 3.8 mmol/L (ref 3.5–5.1)
Sodium: 136 mmol/L (ref 135–145)
TCO2: 21 mmol/L — ABNORMAL LOW (ref 22–32)

## 2024-01-07 LAB — COMPREHENSIVE METABOLIC PANEL WITH GFR
ALT: 16 U/L (ref 0–44)
AST: 33 U/L (ref 15–41)
Albumin: 3.4 g/dL — ABNORMAL LOW (ref 3.5–5.0)
Alkaline Phosphatase: 51 U/L (ref 38–126)
Anion gap: 10 (ref 5–15)
BUN: 16 mg/dL (ref 8–23)
CO2: 21 mmol/L — ABNORMAL LOW (ref 22–32)
Calcium: 8.8 mg/dL — ABNORMAL LOW (ref 8.9–10.3)
Chloride: 104 mmol/L (ref 98–111)
Creatinine, Ser: 1.06 mg/dL — ABNORMAL HIGH (ref 0.44–1.00)
GFR, Estimated: 55 mL/min — ABNORMAL LOW (ref >60)
Glucose, Bld: 111 mg/dL — ABNORMAL HIGH (ref 70–99)
Potassium: 3.7 mmol/L (ref 3.5–5.1)
Sodium: 135 mmol/L (ref 135–145)
Total Bilirubin: 0.4 mg/dL (ref 0.0–1.2)
Total Protein: 7.8 g/dL (ref 6.5–8.1)

## 2024-01-07 LAB — TROPONIN I (HIGH SENSITIVITY)
Troponin I (High Sensitivity): 5 ng/L (ref <18)
Troponin I (High Sensitivity): 5 ng/L (ref <18)

## 2024-01-07 LAB — CBC WITH DIFFERENTIAL/PLATELET
Abs Immature Granulocytes: 0.03 K/uL (ref 0.00–0.07)
Basophils Absolute: 0.1 K/uL (ref 0.0–0.1)
Basophils Relative: 1 %
Eosinophils Absolute: 0.3 K/uL (ref 0.0–0.5)
Eosinophils Relative: 4 %
HCT: 26 % — ABNORMAL LOW (ref 36.0–46.0)
Hemoglobin: 7.9 g/dL — ABNORMAL LOW (ref 12.0–15.0)
Immature Granulocytes: 0 %
Lymphocytes Relative: 26 %
Lymphs Abs: 2.2 K/uL (ref 0.7–4.0)
MCH: 26.2 pg (ref 26.0–34.0)
MCHC: 30.4 g/dL (ref 30.0–36.0)
MCV: 86.1 fL (ref 80.0–100.0)
Monocytes Absolute: 0.9 K/uL (ref 0.1–1.0)
Monocytes Relative: 11 %
Neutro Abs: 4.8 K/uL (ref 1.7–7.7)
Neutrophils Relative %: 58 %
Platelets: 296 K/uL (ref 150–400)
RBC: 3.02 MIL/uL — ABNORMAL LOW (ref 3.87–5.11)
RDW: 18.5 % — ABNORMAL HIGH (ref 11.5–15.5)
WBC: 8.3 K/uL (ref 4.0–10.5)
nRBC: 0 % (ref 0.0–0.2)

## 2024-01-07 LAB — RESP PANEL BY RT-PCR (RSV, FLU A&B, COVID)  RVPGX2
Influenza A by PCR: NEGATIVE
Influenza B by PCR: NEGATIVE
Resp Syncytial Virus by PCR: NEGATIVE
SARS Coronavirus 2 by RT PCR: NEGATIVE

## 2024-01-07 LAB — BRAIN NATRIURETIC PEPTIDE: B Natriuretic Peptide: 75.7 pg/mL (ref 0.0–100.0)

## 2024-01-07 NOTE — ED Triage Notes (Signed)
 Pt c/o cough, shortness of breath and dizziness that has worsened over past week.

## 2024-01-07 NOTE — ED Provider Triage Note (Signed)
 Emergency Medicine Provider Triage Evaluation Note  Veronica Romero , a 73 y.o. female  was evaluated in triage.  Pt complains of shortness of breath, weakness and fatigue.  Patient has a history of heart failure, recently treated for pneumonia, coronary artery disease, rheumatoid arthritis.  Denies fever or chills..  Review of Systems  Positive:  Negative:   Physical Exam  BP (!) 140/53   Pulse 91   Temp 98.3 F (36.8 C)   Resp 16   SpO2 99%  Gen:   Awake, no distress   Resp:  Normal effort  MSK:   Moves extremities without difficulty  Other:    Medical Decision Making  Medically screening exam initiated at 10:29 AM.  Appropriate orders placed.  Veronica Romero was informed that the remainder of the evaluation will be completed by another provider, this initial triage assessment does not replace that evaluation, and the importance of remaining in the ED until their evaluation is complete.  Patient feeling short of breath and weak.  Has multiple different possible cardiopulmonary causes.  Lung sounds are clear, although a bit reduced air entry.  Will obtain chest x-ray, blood work.  Initial EKG nonischemic.   Mannie Pac T, DO 01/07/24 1030

## 2024-01-07 NOTE — ED Notes (Signed)
 Pt in xray

## 2024-01-07 NOTE — ED Notes (Signed)
 Pt leaving lobby AMA. Pt stated she made appointment with PCP.

## 2024-01-07 NOTE — Progress Notes (Signed)
 Based on what you shared with me, I feel your condition warrants further evaluation as soon as possible at an Emergency department.  Giving difficulty maintaining good oxygen levels, this tells me you are dealing with respiratory fatigue and a more substantial illness. I would recommend being evaluated in person ASAP.   NOTE: There will be NO CHARGE for this eVisit   If you are having a true medical emergency please call 911.      Emergency Department-[facility]  Get Driving Directions  [phone]  [address]  [place], [place] [address]  Open 24/7/365      [facility] Emergency Department at [facility]  Get Driving Directions  [address]  [place], [place] [address]  Open 24/7/365    Emergency Department- [facility]  Get Driving Directions  [phone]  [address]  [place], [place] [address]  Open 24/7/365      [facility]  Get Driving Directions  [phone]  [address]  [place], [place] [address]  Open 24/7/365    [facility]  Emergency Department- [facility]  Get Driving Directions  [phone]  [address]  [place], [place] [address]  Open 24/7/365    [place]  Emergency Department- [facility]  Get Driving Directions  [address]  [place], [place] [address]  Open 24/7/365    [facility]  Emergency Department- [facility]  Get Driving Directions  [phone]  [address]  , [place] [address]  Open 24/7/365

## 2024-01-08 ENCOUNTER — Encounter: Payer: Self-pay | Admitting: Cardiology

## 2024-01-13 DIAGNOSIS — M05742 Rheumatoid arthritis with rheumatoid factor of left hand without organ or systems involvement: Principal | ICD-10-CM

## 2024-01-13 DIAGNOSIS — M8589 Other specified disorders of bone density and structure, multiple sites: Principal | ICD-10-CM

## 2024-01-13 DIAGNOSIS — M059 Rheumatoid arthritis with rheumatoid factor, unspecified: Principal | ICD-10-CM

## 2024-01-13 DIAGNOSIS — M0579 Rheumatoid arthritis with rheumatoid factor of multiple sites without organ or systems involvement: Principal | ICD-10-CM

## 2024-01-13 DIAGNOSIS — M05741 Rheumatoid arthritis with rheumatoid factor of right hand without organ or systems involvement: Principal | ICD-10-CM

## 2024-01-13 MED ORDER — METHOTREXATE SODIUM (CONTAINS PRESERVATIVES) 25 MG/ML INJECTION SOLUTION
SUBCUTANEOUS | 1 refills | 84.00000 days | Status: CP
Start: 2024-01-13 — End: ?

## 2024-01-15 ENCOUNTER — Inpatient Hospital Stay

## 2024-01-15 ENCOUNTER — Telehealth: Payer: Self-pay | Admitting: Physician Assistant

## 2024-01-15 ENCOUNTER — Inpatient Hospital Stay: Admitting: Hematology and Oncology

## 2024-01-17 NOTE — Progress Notes (Unsigned)
 [facility] [phone]   [phone]) [phone]  CONSULT NOTE  REFERRING PHYSICIAN: Darice Petty  REASON FOR CONSULTATION:  Anemia  HPI Veronica Romero is a 73 y.o. female with a past medical history significant for coronary artery disease, NSTEMI, atherosclerosis, heart failure, acute respiratory failure, rheumatoid arthritis, and hyperlipidemia is referred to the clinic for anemia.  The patient presented to the emergency room on 01/07/2024.  Per triage notes she was complaining of shortness of breath, weakness, and fatigue.  She has heart failure and was recently treated for pneumonia, coronary artery disease, and rheumatoid arthritis.  She left the ER AMA and reportedly had an appointment with her PCP.   Her labs in the ER showed Hbg of 7.9. WBC normal at 8.3. Her troponin was normal. Respiratory panel negative. Her BNP was normal at 75.7.   She was referred to the clinic due to worsening anemia.  She has not been able to see cardiology or her PCP recently.  She was struggling with hypotension recently and had been having ongoing cough, shortness of breath, dizziness, and weakness.  She is also not able to ambulate as far as she was previously.  However her daughter discontinued some of her blood pressure medications a few days ago and she had improvement in her hypotension and has been feeling better over the last few days.  The oldest labs available to me are from 2018. She had mild intermittent anemia in 2020 and 2021. She started having persistent anemia since 2023. It looks like her Hbg improved breifly in May 2024 but then she has had some gradually worsening anemia again over this past year.   She denies any known vitamin deficiencies besides being told in the past that she had iron deficiency anemia.  She required a blood transfusion many years ago when she had a miscarriage.  She has never had an iron infusion.  She recently started using iron  subcutaneous patches.  The patient takes prednisone  10 mg alternating with 5 mg every other day due to her rheumatoid arthritis.  He denies any abdominal pain or indigestion.  She is also on methotrexate .  She takes folic acid .  She does not take any NSAIDs.  She has never had a colonoscopy.  A Cologuard test was ordered a few months ago but has not been completed at this time.   Overall she reports low energy which has been progressively worsening over the last few months.  She used to be able to walk her dog longer distances than she is at this time.  Her cough has since resolved.  The dizziness and lightheadedness has improved with her discontinuing some of her antihypertensives.  She denies any abnormal bleeding or bruising.  She is on Plavix  and aspirin .  She had some epistaxis a while ago but none recently.  He denies any dietary restrictions.  She denies craving ice chips.  Denies any history of bariatric surgery.  Denies any fever, chills, night sweats, or unexplained weight loss.  Her mother passed away at the age of 75 and was overall fairly healthy.  The patient's father had heart disease and glioblastoma.  The patient's brother died secondary to kidney disease.  The patient has a sister with breast cancer and history of ovarian cancer.  Her younger sister also has a history of lymphoma and breast cancer.    Patient worked as a Architectural technologist.  She is widowed.  She is a never smoker.  She  drinks wine every other night.     HPI  Past Medical History:  Diagnosis Date   Anemia    CHF (congestive heart failure) (HCC)    Coronary artery disease    Diabetes mellitus without complication (HCC)    Hypertension    ILD (interstitial lung disease) (HCC)    Ischemic cardiomyopathy 06/12/2021   EF 25-30%   Pulmonary HTN (HCC)    Rheumatoid arthritis (HCC)     Past Surgical History:  Procedure Laterality Date   BREAST BIOPSY Left    ?when//benign   CORONARY ANGIOPLASTY     CORONARY  ATHERECTOMY N/A 07/17/2021   Procedure: CORONARY ATHERECTOMY;  Surgeon: Swaziland, Peter M, MD;  Location: [facility] INVASIVE CV LAB;  Service: Cardiovascular;  Laterality: N/A;   RIGHT/LEFT HEART CATH AND CORONARY ANGIOGRAPHY N/A 06/16/2021   Procedure: RIGHT/LEFT HEART CATH AND CORONARY ANGIOGRAPHY;  Surgeon: Claudene Victory ORN, MD;  Location: [facility] INVASIVE CV LAB;  Service: Cardiovascular;  Laterality: N/A;    Family History  Problem Relation Age of Onset   Healthy Mother    Heart disease Father    Brain cancer Father    Kidney disease Brother    Autoimmune disease Daughter     Social History Social History   Tobacco Use   Smoking status: Never   Smokeless tobacco: Never  Vaping Use   Vaping status: Never Used  Substance Use Topics   Alcohol  use: Yes    Alcohol /week: 2.0 standard drinks of alcohol     Types: 2 Glasses of wine per week    Comment: occasionally   Drug use: Never    Allergies  Allergen Reactions   Penicillins Rash    Current Outpatient Medications  Medication Sig Dispense Refill   Adalimumab (HUMIRA PEN) 40 MG/0.4ML PNKT Inject 0.4 mLs into the skin every Tuesday. Tuesday     aspirin  EC 81 MG tablet Take 1 tablet (81 mg total) by mouth daily. Swallow whole. 90 tablet 1   carvedilol  (COREG ) 3.125 MG tablet Take 1 tablet (3.125 mg total) by mouth 2 (two) times daily with a meal. 90 tablet 2   Cholecalciferol  (VITAMIN D3 PO) Take 1 tablet by mouth daily.     clopidogrel  (PLAVIX ) 75 MG tablet Take 1 tablet (75 mg total) by mouth daily. 90 tablet 1   cyanocobalamin (VITAMIN B12) 1000 MCG tablet Take 1,000 mcg by mouth daily.     diclofenac Sodium (VOLTAREN) 1 % GEL Apply 2 g topically. OTC     folic acid  (FOLVITE ) 800 MCG tablet Take 800 mcg by mouth daily.     furosemide  (LASIX ) 40 MG tablet Take 1 tablet (40 mg total) by mouth once a week. (Patient not taking: Reported on 12/17/2023) 17 tablet 3   leucovorin (WELLCOVORIN) 5 MG tablet Take 15 mg by mouth every Saturday.      methotrexate  50 MG/2ML injection Inject 1 mL (25 mg total) into the muscle every Saturday. Hold methotrexate  for 2 more weeks and then resume 3/10     Multiple Vitamin (MULTIVITAMIN WITH MINERALS) TABS tablet Take 1 tablet by mouth daily.     nitroGLYCERIN  (NITROSTAT ) 0.4 MG SL tablet Place 1 tablet (0.4 mg total) under the tongue every 5 (five) minutes as needed. 25 tablet 2   Polyethyl Glycol-Propyl Glycol (LUBRICANT EYE DROPS) 0.4-0.3 % SOLN Place 1 drop into both eyes 3 (three) times daily as needed (dry/irritated eyes.).     predniSONE  (DELTASONE ) 5 MG tablet Take 10 mg by mouth in the  morning. (Patient taking differently: Take 10 mg by mouth in the morning. Alternating 5mg  and 10mg  daily)     rosuvastatin  (CRESTOR ) 40 MG tablet Take 1 tablet (40 mg total) by mouth daily. 90 tablet 1   sacubitril-valsartan (ENTRESTO ) 24-26 MG Take 1 tablet by mouth 2 (two) times daily. (Patient not taking: Reported on 12/17/2023) 180 tablet 3   spironolactone  (ALDACTONE ) 25 MG tablet Take 0.5 tablets (12.5 mg total) by mouth daily. Take additional Tablet 12.5 mg for swelling 135 tablet 2   traMADol  (ULTRAM ) 50 MG tablet Take 50 mg by mouth every 6 (six) hours as needed (pain.).     No current facility-administered medications for this visit.    REVIEW OF SYSTEMS:   Review of Systems  Constitutional: Positive for fatigue.  Negative for appetite change, chills, fever and unexpected weight change.  HENT: Negative for mouth sores, nosebleeds, sore throat and trouble swallowing.   Eyes: Negative for eye problems and icterus.  Respiratory: Positive for dyspnea on exertion. Negative for cough (resolved), hemoptysis, and wheezing.   Cardiovascular: Negative for chest pain and leg swelling.  Gastrointestinal: Negative for abdominal pain, constipation, diarrhea, nausea and vomiting.  Genitourinary: Negative for bladder incontinence, difficulty urinating, dysuria, frequency and hematuria.   Musculoskeletal: Negative  for back pain, gait problem, neck pain and neck stiffness.  Skin: Negative for itching and rash.  Neurological: Improved lightheadedness/dizziness. Negative for extremity weakness, gait problem, headaches, and seizures.  Hematological: Negative for adenopathy. Does not bruise/bleed easily.  Psychiatric/Behavioral: Negative for confusion, depression and sleep disturbance. The patient is not nervous/anxious.     PHYSICAL EXAMINATION:  There were no vitals taken for this visit.  ECOG PERFORMANCE STATUS: 1  Physical Exam  Constitutional: Oriented to person, place, and time and well-developed, well-nourished, and in no distress. HENT:  Head: Normocephalic and atraumatic.  Mouth/Throat: Oropharynx is clear and moist. No oropharyngeal exudate.  Eyes: Conjunctivae are normal. Right eye exhibits no discharge. Left eye exhibits no discharge. No scleral icterus.  Neck: Normal range of motion. Neck supple.  Cardiovascular: Normal rate, regular rhythm, normal heart sounds and intact distal pulses.   Pulmonary/Chest: Effort normal and breath sounds normal. No respiratory distress. No wheezes. No rales.  Abdominal: Soft. Bowel sounds are normal. Exhibits no distension and no mass. There is no tenderness.  Musculoskeletal: Normal range of motion. Exhibits no edema.  Lymphadenopathy:    No cervical adenopathy.  Neurological: Alert and oriented to person, place, and time. Exhibits normal muscle tone. Gait normal. Coordination normal.  Skin: Skin is warm and dry. No rash noted. Not diaphoretic. No erythema. Positive for pallor.  Psychiatric: Mood, memory and judgment normal.  Vitals reviewed.  LABORATORY DATA: Lab Results  Component Value Date   WBC 8.3 01/07/2024   HGB 8.8 (L) 01/07/2024   HCT 26.0 (L) 01/07/2024   MCV 86.1 01/07/2024   PLT 296 01/07/2024      Chemistry      Component Value Date/Time   NA 136 01/07/2024 1110   NA 139 12/17/2023 1424   K 3.8 01/07/2024 1110   CL 104  01/07/2024 1110   CO2 21 (L) 01/07/2024 1031   BUN 16 01/07/2024 1110   BUN 19 12/17/2023 1424   CREATININE 1.00 01/07/2024 1110      Component Value Date/Time   CALCIUM  8.8 (L) 01/07/2024 1031   ALKPHOS 51 01/07/2024 1031   AST 33 01/07/2024 1031   ALT 16 01/07/2024 1031   BILITOT 0.4 01/07/2024 1031  BILITOT 0.2 12/17/2023 1424       RADIOGRAPHIC STUDIES: DG Chest 1 View Result Date: 01/07/2024 CLINICAL DATA:  Shortness of breath and cough for 1 week. EXAM: CHEST  1 VIEW COMPARISON:  06/21/2021. FINDINGS: Low lung volume. Mild diffuse pulmonary vascular congestion, likely accentuated by low lung volume. No frank pulmonary edema. Bilateral lung fields are otherwise clear. No acute consolidation or lung collapse. Bilateral costophrenic angles are clear. Normal cardio-mediastinal silhouette. Moderate size retrocardiac hiatal hernia noted. No acute osseous abnormalities. The soft tissues are within normal limits. IMPRESSION: 1. Mild diffuse pulmonary vascular congestion, likely accentuated by low lung volume. No frank pulmonary edema. 2. No acute consolidation or lung collapse. Electronically Signed   By: Ree Molt M.D.   On: 01/07/2024 10:55    ASSESSMENT: This is a very pleasant 73 year old Caucasian female referred to the clinic for anemia.    The patient had several lab studies today including CBC, iron studies, ferritin, folate, B12, CMP, sample blood bank, and SPEP.     Her labs from today demonstrate Hbg 8.2.  Her MCV is 81.  Her iron studies show low iron at 29 and low saturation and elevated TIBC.  Her other labs are pending.  We will arrange for IV iron infusions with Venofer 300 mg weekly x 3 the [facility] infusion center.  She was advised to start taking a dedicated iron supplement p.o. daily.  Unclear if the iron patch is effective to manage her iron deficiency.  Dr. Sherrod recommends urgent referral to gastroenterology to rule out blood loss since she has not  had colonoscopy. Would also recommend consideration of EGD.  Of note the patient is taking prednisone .  She denies any signs and symptoms of gastritis.   We are still back for labs and a follow-up in 1 month.  I asked that she reach out to cardiology and her PCP to inform them of her medication recommendations regarding her antihypertensives in the setting of her hypotension and dizziness.  Her daughter had discontinued some of her antihypertensives and the patient has been feeling better since that time.    She does have a family history of malignancy.  I asked that she reach out to her sisters and ask if they were found to have any hereditary predisposition for malignancy.  If so then the patient may need to see genetic counselor.  The patient voices understanding of current disease status and treatment options and is in agreement with the current care plan.  All questions were answered. The patient knows to call the clinic with any problems, questions or concerns. We can certainly see the patient much sooner if necessary.  Thank you so much for allowing me to participate in the care of Veronica Romero. I will continue to follow up the patient with you and assist in her care.   Disclaimer: This note was dictated with voice recognition software. Similar sounding words can inadvertently be transcribed and may not be corrected upon review.   Bach Rocchi L Keyleigh Manninen January 17, 2024, 12:04 PM  ADDENDUM: Hematology/Oncology Attending:  I had a face-to-face encounter with the patient today.  I reviewed her records, lab and recommended her care plan.  This is a very pleasant 73 years old white female with past medical history significant for coronary artery disease status post NSTEMI, rheumatoid arthritis, dyslipidemia as well as congestive heart failure.  The patient has a history of anemia that has been going on for several years.  She  never had a colonoscopy in the past.  She presented to  the emergency department on 01/07/2024 complaining of shortness of breath as well as weakness and fatigue.  During her evaluation CBC showed hemoglobin of 7.9.  She had similar results in the past and on January 17, 2022 her hemoglobin was 8.2.  The patient had Cologuard testing kit at home but she has not done it.  She had anemia panel performed with around 6 months ago that showed above normal vitamin B12 as well as elevated serum folate but her serum iron was normal at 81 and ferritin 58.  She was referred to us  today for evaluation and recommendation regarding her persistent anemia.  When seen today she continues to complain of fatigue and weakness.  Repeat CBC today showed hemoglobin of 8.2 and hematocrit 25.5%.  Her MCV is 81.5.  The patient had normal white blood count as well as elevated platelets count of 4 29,000.  Iron study performed today showed serum iron of 29 with iron saturation of 6%.  She has elevated vitamin B12 level of 1883 and folic acid  over 20.  Ferritin level is still pending. I had a lengthy discussion with the patient and her daughter today about her current condition and further investigation to identify what is etiology of her anemia. She probably has anemia of chronic disease in addition to iron deficiency. I recommended for the patient referral to gastroenterology for consideration of colonoscopy and probably upper endoscopy for evaluation of her persistent anemia.  The patient did not have any screening colonoscopy in the past. We will also arrange for the patient to receive iron infusion in the next few weeks. We will see her back for follow-up visit in 1 months for evaluation and repeat blood work. She was advised to call immediately if she has any other concerning symptoms in the interval. The total time spent in the appointment was 60 minutes including review of chart and various tests results, discussions about plan of care and coordination of care plan . Disclaimer:  This note was dictated with voice recognition software. Similar sounding words can inadvertently be transcribed and may be missed upon review. Sherrod MARLA Sherrod, MD

## 2024-01-20 ENCOUNTER — Inpatient Hospital Stay: Attending: Physician Assistant | Admitting: Physician Assistant

## 2024-01-20 ENCOUNTER — Inpatient Hospital Stay: Admitting: Physician Assistant

## 2024-01-20 ENCOUNTER — Other Ambulatory Visit: Payer: Self-pay | Admitting: Physician Assistant

## 2024-01-20 VITALS — BP 145/60 | HR 98 | Temp 98.1°F | Resp 20 | Wt 160.9 lb

## 2024-01-20 DIAGNOSIS — Z7952 Long term (current) use of systemic steroids: Secondary | ICD-10-CM | POA: Diagnosis not present

## 2024-01-20 DIAGNOSIS — D509 Iron deficiency anemia, unspecified: Secondary | ICD-10-CM | POA: Insufficient documentation

## 2024-01-20 DIAGNOSIS — Z808 Family history of malignant neoplasm of other organs or systems: Secondary | ICD-10-CM | POA: Diagnosis not present

## 2024-01-20 DIAGNOSIS — Z807 Family history of other malignant neoplasms of lymphoid, hematopoietic and related tissues: Secondary | ICD-10-CM | POA: Insufficient documentation

## 2024-01-20 DIAGNOSIS — Z803 Family history of malignant neoplasm of breast: Secondary | ICD-10-CM | POA: Insufficient documentation

## 2024-01-20 DIAGNOSIS — Z8041 Family history of malignant neoplasm of ovary: Secondary | ICD-10-CM | POA: Insufficient documentation

## 2024-01-20 DIAGNOSIS — D649 Anemia, unspecified: Secondary | ICD-10-CM

## 2024-01-20 DIAGNOSIS — R531 Weakness: Secondary | ICD-10-CM | POA: Diagnosis not present

## 2024-01-20 DIAGNOSIS — E611 Iron deficiency: Secondary | ICD-10-CM

## 2024-01-20 DIAGNOSIS — R5383 Other fatigue: Secondary | ICD-10-CM | POA: Insufficient documentation

## 2024-01-20 LAB — CMP (CANCER CENTER ONLY)
ALT: 18 U/L (ref 0–44)
AST: 29 U/L (ref 15–41)
Albumin: 4 g/dL (ref 3.5–5.0)
Alkaline Phosphatase: 62 U/L (ref 38–126)
Anion gap: 6 (ref 5–15)
BUN: 20 mg/dL (ref 8–23)
CO2: 25 mmol/L (ref 22–32)
Calcium: 9.4 mg/dL (ref 8.9–10.3)
Chloride: 104 mmol/L (ref 98–111)
Creatinine: 0.77 mg/dL (ref 0.44–1.00)
GFR, Estimated: >60 mL/min (ref >60)
Glucose, Bld: 103 mg/dL — ABNORMAL HIGH (ref 70–99)
Potassium: 4.1 mmol/L (ref 3.5–5.1)
Sodium: 135 mmol/L (ref 135–145)
Total Bilirubin: 0.4 mg/dL (ref 0.0–1.2)
Total Protein: 8.2 g/dL — ABNORMAL HIGH (ref 6.5–8.1)

## 2024-01-20 LAB — VITAMIN B12: Vitamin B-12: 1883 pg/mL — ABNORMAL HIGH (ref 180–914)

## 2024-01-20 LAB — CBC WITH DIFFERENTIAL (CANCER CENTER ONLY)
Abs Immature Granulocytes: 0.03 K/uL (ref 0.00–0.07)
Basophils Absolute: 0.1 K/uL (ref 0.0–0.1)
Basophils Relative: 1 %
Eosinophils Absolute: 0.1 K/uL (ref 0.0–0.5)
Eosinophils Relative: 2 %
HCT: 25.5 % — ABNORMAL LOW (ref 36.0–46.0)
Hemoglobin: 8.2 g/dL — ABNORMAL LOW (ref 12.0–15.0)
Immature Granulocytes: 0 %
Lymphocytes Relative: 23 %
Lymphs Abs: 2 K/uL (ref 0.7–4.0)
MCH: 26.2 pg (ref 26.0–34.0)
MCHC: 32.2 g/dL (ref 30.0–36.0)
MCV: 81.5 fL (ref 80.0–100.0)
Monocytes Absolute: 0.6 K/uL (ref 0.1–1.0)
Monocytes Relative: 7 %
Neutro Abs: 5.9 K/uL (ref 1.7–7.7)
Neutrophils Relative %: 67 %
Platelet Count: 429 K/uL — ABNORMAL HIGH (ref 150–400)
RBC: 3.13 MIL/uL — ABNORMAL LOW (ref 3.87–5.11)
RDW: 18.6 % — ABNORMAL HIGH (ref 11.5–15.5)
WBC Count: 8.7 K/uL (ref 4.0–10.5)
nRBC: 0 % (ref 0.0–0.2)

## 2024-01-20 LAB — IRON AND IRON BINDING CAPACITY (CC-WL,HP ONLY)
Iron: 29 ug/dL (ref 28–170)
Saturation Ratios: 6 % — ABNORMAL LOW (ref 10.4–31.8)
TIBC: 515 ug/dL — ABNORMAL HIGH (ref 250–450)
UIBC: 486 ug/dL — ABNORMAL HIGH (ref 148–442)

## 2024-01-20 LAB — FOLATE: Folate: >20 ng/mL (ref >5.9)

## 2024-01-20 LAB — FERRITIN: Ferritin: 41 ng/mL (ref 11–307)

## 2024-01-20 LAB — SAMPLE TO BLOOD BANK

## 2024-01-20 LAB — ABO/RH: ABO/RH(D): A POS

## 2024-01-21 ENCOUNTER — Telehealth: Payer: Self-pay | Admitting: Pharmacy Technician

## 2024-01-21 NOTE — Telephone Encounter (Signed)
 Dr. Nina,  Preferred medication is Feraheme.  Would you like to use Feraheme.  (Venofer will be denied)  Auth Submission: NO AUTH NEEDED Site of care: Site of care: [facility] Payer: Medicare a/b Medication & CPT/J Code(s) submitted: Feraheme (ferumoxytol) [id number] Diagnosis Code: d50.9 Route of submission (phone, fax, portal):  Phone # Fax # Auth type: Buy/Bill PB Units/visits requested: x2 doses Reference number:  Approval from: 01/21/24 to 04/29/24

## 2024-01-24 LAB — MULTIPLE MYELOMA PANEL, SERUM
Albumin SerPl Elph-Mcnc: 3.3 g/dL (ref 2.9–4.4)
Albumin/Glob SerPl: 0.8 (ref 0.7–1.7)
Alpha 1: 0.3 g/dL (ref 0.0–0.4)
Alpha2 Glob SerPl Elph-Mcnc: 0.9 g/dL (ref 0.4–1.0)
B-Globulin SerPl Elph-Mcnc: 1.4 g/dL — ABNORMAL HIGH (ref 0.7–1.3)
Gamma Glob SerPl Elph-Mcnc: 1.8 g/dL (ref 0.4–1.8)
Globulin, Total: 4.3 g/dL — ABNORMAL HIGH (ref 2.2–3.9)
IgA: 589 mg/dL — ABNORMAL HIGH (ref 64–422)
IgG (Immunoglobin G), Serum: 1619 mg/dL — ABNORMAL HIGH (ref 586–1602)
IgM (Immunoglobulin M), Srm: 293 mg/dL — ABNORMAL HIGH (ref 26–217)
Total Protein ELP: 7.6 g/dL (ref 6.0–8.5)

## 2024-01-29 ENCOUNTER — Other Ambulatory Visit: Payer: Self-pay | Admitting: Physician Assistant

## 2024-01-29 ENCOUNTER — Telehealth: Payer: Self-pay

## 2024-01-29 ENCOUNTER — Encounter: Payer: Self-pay | Admitting: Physician Assistant

## 2024-01-29 ENCOUNTER — Ambulatory Visit (INDEPENDENT_AMBULATORY_CARE_PROVIDER_SITE_OTHER): Admitting: Physician Assistant

## 2024-01-29 VITALS — BP 122/68 | HR 88 | Ht 67.0 in | Wt 161.0 lb

## 2024-01-29 DIAGNOSIS — M0579 Rheumatoid arthritis with rheumatoid factor of multiple sites without organ or systems involvement: Secondary | ICD-10-CM

## 2024-01-29 DIAGNOSIS — K219 Gastro-esophageal reflux disease without esophagitis: Secondary | ICD-10-CM

## 2024-01-29 DIAGNOSIS — I2510 Atherosclerotic heart disease of native coronary artery without angina pectoris: Secondary | ICD-10-CM

## 2024-01-29 DIAGNOSIS — D509 Iron deficiency anemia, unspecified: Secondary | ICD-10-CM

## 2024-01-29 DIAGNOSIS — D649 Anemia, unspecified: Secondary | ICD-10-CM

## 2024-01-29 DIAGNOSIS — K449 Diaphragmatic hernia without obstruction or gangrene: Secondary | ICD-10-CM | POA: Diagnosis not present

## 2024-01-29 DIAGNOSIS — I5020 Unspecified systolic (congestive) heart failure: Secondary | ICD-10-CM

## 2024-01-29 MED ORDER — NA SULFATE-K SULFATE-MG SULF 17.5-3.13-1.6 GM/177ML PO SOLN: 1.0000 | ORAL | 0 refills | Status: DC

## 2024-01-29 MED ORDER — OMEPRAZOLE 40 MG PO CPDR: 40.0000 mg | DELAYED_RELEASE_CAPSULE | Freq: Every day | ORAL | 3 refills | Status: AC

## 2024-01-29 NOTE — Patient Instructions (Addendum)
 Your provider has requested that you go to the basement level for lab work on (03/03/24 ). Press B on the elevator. The lab is located at the first door on the left as you exit the elevator.  We have sent the following medications to your pharmacy for you to pick up at your convenience: Suprep and Omeprazole   You have been scheduled for an endoscopy and colonoscopy. Please follow the written instructions given to you at your visit today.  If you use inhalers (even only as needed), please bring them with you on the day of your procedure.  DO NOT TAKE 7 DAYS PRIOR TO TEST- Trulicity (dulaglutide) Ozempic, Wegovy (semaglutide) Mounjaro (tirzepatide) Bydureon Bcise (exanatide extended release)  DO NOT TAKE 1 DAY PRIOR TO YOUR TEST Rybelsus (semaglutide) Adlyxin (lixisenatide) Victoza (liraglutide) Byetta (exanatide) ___________________________________________________________________________  Due to recent changes in healthcare laws, you may see the results of your imaging and laboratory studies on MyChart before your provider has had a chance to review them.  We understand that in some cases there may be results that are confusing or concerning to you. Not all laboratory results come back in the same time frame and the provider may be waiting for multiple results in order to interpret others.  Please give us  48 hours in order for your provider to thoroughly review all the results before contacting the office for clarification of your results.   _______________________________________________________  If your blood pressure at your visit was 140/90 or greater, please contact your primary care physician to follow up on this.  _______________________________________________________  If you are age 14 or older, your body mass index should be between 23-30. Your Body mass index is 25.22 kg/m. If this is out of the aforementioned range listed, please consider follow up with your Primary Care  Provider.  If you are age 75 or younger, your body mass index should be between 19-25. Your Body mass index is 25.22 kg/m. If this is out of the aformentioned range listed, please consider follow up with your Primary Care Provider.   ________________________________________________________  The [facility] providers would like to encourage you to use MYCHART to communicate with providers for non-urgent requests or questions.  Due to long hold times on the telephone, sending your provider a message by [facility] may be a faster and more efficient way to get a response.  Please allow 48 business hours for a response.  Please remember that this is for non-urgent requests.  _______________________________________________________  [facility] is using a team-based approach to care.  Your team is made up of your doctor and two to three APPS. Our APPS (Nurse Practitioners and Physician Assistants) work with your physician to ensure care continuity for you. They are fully qualified to address your health concerns and develop a treatment plan. They communicate directly with your gastroenterologist to care for you. Seeing the Advanced Practice Practitioners on your physician's team can help you by facilitating care more promptly, often allowing for earlier appointments, access to diagnostic testing, procedures, and other specialty referrals.   Thank you for choosing me and [facility].  Alan Coombs, PA-C

## 2024-01-29 NOTE — Telephone Encounter (Signed)
 Dr. Sheena We are asked for plavix  hold for EGD/colonoscopy. She has 30 mm stenting in the ostial LAD which falls outside of our algorithm:  OCT guided complex atherectomy to ostial-mid LAD with DES 3.0 x 30 mm in 06/2021. She has residual 50% stenosis in the mid RCA and distal LCX.   Can you please provide recommendations for plavix  hold?

## 2024-01-29 NOTE — Progress Notes (Signed)
 01/29/2024 [name] [id number] [birth year]  Referring provider: Melvin Pao, NP Primary GI doctor: Dr. San  ASSESSMENT AND PLAN:  Anemia 01/20/2024  HGB 8.2 MCV 81.5 Platelets 429 01/20/2024 Iron 29 Ferritin 41 B12 1,883 Recent Labs    08/08/23 1437 12/17/23 1424 01/07/24 1031 01/07/24 1110 01/20/24 1308  HGB 10.3* 8.7* 7.9* 8.8* 8.2*  Last seen hematology 01/20/2024, being set up for IV iron, on an oral iron Never had colon/EGD, no family history of GI cancer No overt GI bleeding Chronic iron deficiency anemia with low hemoglobin and iron levels. Possible causes include Cameron ulcers, AVMs, or malignancy. Awaiting IV iron infusion. - Administer IV iron infusion as scheduled. - Continue oral iron supplementation. - Monitor CBC before endoscopy. - schedule EGD/colonoscopy, Risk of bowel prep, conscious sedation, and EGD and colonoscopy were discussed.  Risks include but are not limited to dehydration, pain, bleeding, cardiopulmonary process, bowel perforation, or other possible adverse outcomes.. Treatment plan was discussed with patient, and agreed upon.  GERD with hiatal hernia Has throat clearing, hoarseness No GERD, nausea, vomiting, no dysphagia No NSAIDS, no tobacco use, 1 glass of wine a night for years, potential increase ETOH in the past - stop ETOH - Increase omeprazole to 40 mg daily.  Ischemic cardiomyopathy with reduced ejection fraction 05/07/2023 echo EF 45-50% mild MR no aortic stenosis  CAD Status post DES to LAD 07/17/2021 residual disease with medical management On Plavix  and bASA No chest pain, SOB Hold Plavix  for 5 days before procedure will instruct when and how to resume after procedure.  Patient understands that there is a low but real risk of cardiovascular event such as heart attack, stroke, or embolism /  thrombosis, or ischemia while off Plavix .  The patient consents to proceed.  Will communicate by phone or EMR with  patient's prescribing provider to confirm that holding Plavix  is reasonable in this case.   Mild pulmonary hypertension No history of smoking Has DOE with exertion but secondary to IDA No oxygen use  Rheumatoid arthritis On prednisone  alternating 10 mg and 5 mg every other day x 50 years  On methotrexate  and Humira  Alcohol  use -Alcohol  Abstinence counseling discussed with patient as continued use is strongly associated with worsening liver disease progression - get on MVIT - discuss with PCP about resources and medications, discussed briefly with patient   Patient Care Team: Melvin Pao, NP as PCP - General (Nurse Practitioner) Tobb, Kardie, DO as Consulting Physician (Cardiology) Thais Real BROCKS, MD (Rheumatology) Tanda Cooper Gallo, PA-C (Rheumatology)  HISTORY OF PRESENT ILLNESS: 73 y.o. female with a past medical history listed below presents for evaluation of IDA.   Discussed the use of AI scribe software for clinical note transcription with the patient, who gave verbal consent to proceed.  History of Present Illness   SIRA ADSIT is a 73 year old female with anemia and rheumatoid arthritis who presents for evaluation of anemia.  She has anemia characterized by low red blood cell count, hemoglobin, and iron levels. She has not started IV iron treatments but is on oral iron supplementation. There is no history of colonoscopy or endoscopy and no family history of gastrointestinal cancer. She reports no blood in stool or melena.  Her rheumatoid arthritis is managed with methotrexate , Humira, and prednisone  (10 mg daily), which she has been on since age 1. She also takes leucovorin to counteract methotrexate  effects. She experiences ulnar deviation.  She has heart disease with a stent placed  in March 2023 and is on Plavix  and aspirin . She experiences fatigue and breathlessness with minimal exertion. Occasional ankle swelling occurs with prolonged standing. A  recent echocardiogram showed slightly decreased heart function.  She denies heartburn, reflux, nausea, or vomiting regularly but recalls a past diagnosis of a hiatal hernia. She experiences throat clearing and hoarseness, attributed to allergies, and takes over-the-counter omeprazole daily for stomach issues, likely related to arthritis medication.  She denies smoking and reports occasional wine consumption several times a week, with a history of social drinking with her husband but no excessive past alcohol  use. No current or past tobacco use. She denies vaginal bleeding or frequent epistaxis, though she experiences rare epistaxis when it is dry. No abdominal pain, diarrhea, or constipation. She has been on omeprazole for at least a year due to stomach discomfort, described as lower abdominal pain.        She  reports that she has never smoked. She has never used smokeless tobacco. She reports current alcohol  use of about 2.0 standard drinks of alcohol  per week. She reports that she does not use drugs.  RELEVANT GI HISTORY, IMAGING AND LABS: Results   LABS Hemoglobin: decreased  DIAGNOSTIC Echocardiogram: mild mitral regurgitation      CBC    Component Value Date/Time   WBC 8.7 01/20/2024 1308   WBC 8.3 01/07/2024 1031   RBC 3.13 (L) 01/20/2024 1308   HGB 8.2 (L) 01/20/2024 1308   HGB 8.7 (L) 12/17/2023 1424   HCT 25.5 (L) 01/20/2024 1308   HCT 29.1 (L) 12/17/2023 1424   PLT 429 (H) 01/20/2024 1308   PLT 320 12/17/2023 1424   MCV 81.5 01/20/2024 1308   MCV 91 12/17/2023 1424   MCH 26.2 01/20/2024 1308   MCHC 32.2 01/20/2024 1308   RDW 18.6 (H) 01/20/2024 1308   RDW 15.2 12/17/2023 1424   LYMPHSABS 2.0 01/20/2024 1308   LYMPHSABS 1.7 12/17/2023 1424   MONOABS 0.6 01/20/2024 1308   EOSABS 0.1 01/20/2024 1308   EOSABS 0.2 12/17/2023 1424   BASOSABS 0.1 01/20/2024 1308   BASOSABS 0.1 12/17/2023 1424   Recent Labs    08/08/23 1437 12/17/23 1424 01/07/24 1031  01/07/24 1110 01/20/24 1308  HGB 10.3* 8.7* 7.9* 8.8* 8.2*    CMP     Component Value Date/Time   NA 135 01/20/2024 1308   NA 139 12/17/2023 1424   K 4.1 01/20/2024 1308   CL 104 01/20/2024 1308   CO2 25 01/20/2024 1308   GLUCOSE 103 (H) 01/20/2024 1308   BUN 20 01/20/2024 1308   BUN 19 12/17/2023 1424   CREATININE 0.77 01/20/2024 1308   CALCIUM  9.4 01/20/2024 1308   PROT 8.2 (H) 01/20/2024 1308   PROT 7.7 12/17/2023 1424   ALBUMIN 4.0 01/20/2024 1308   ALBUMIN 4.1 12/17/2023 1424   AST 29 01/20/2024 1308   ALT 18 01/20/2024 1308   ALKPHOS 62 01/20/2024 1308   BILITOT 0.4 01/20/2024 1308   GFRNONAA >60 01/20/2024 1308      Latest Ref Rng & Units 01/20/2024    1:08 PM 01/07/2024   10:31 AM 12/17/2023    2:24 PM  Hepatic Function  Total Protein 6.5 - 8.1 g/dL 8.2  7.8  7.7   Albumin 3.5 - 5.0 g/dL 4.0  3.4  4.1   AST 15 - 41 U/L 29  33  26   ALT 0 - 44 U/L 18  16  12    Alk Phosphatase 38 - 126 U/L 62  51  61   Total Bilirubin 0.0 - 1.2 mg/dL 0.4  0.4  0.2       Current Medications:   Current Outpatient Medications (Endocrine & Metabolic):    predniSONE  (DELTASONE ) 5 MG tablet, Take 10 mg by mouth in the morning.  Current Outpatient Medications (Cardiovascular):    nitroGLYCERIN  (NITROSTAT ) 0.4 MG SL tablet, Place 1 tablet (0.4 mg total) under the tongue every 5 (five) minutes as needed.   rosuvastatin  (CRESTOR ) 40 MG tablet, Take 1 tablet (40 mg total) by mouth daily.   sacubitril-valsartan (ENTRESTO ) 24-26 MG, Take 1 tablet by mouth 2 (two) times daily.   spironolactone  (ALDACTONE ) 25 MG tablet, Take 0.5 tablets (12.5 mg total) by mouth daily. Take additional Tablet 12.5 mg for swelling   carvedilol  (COREG ) 3.125 MG tablet, Take 1 tablet (3.125 mg total) by mouth 2 (two) times daily with a meal. (Patient not taking: Reported on 01/29/2024)   Current Outpatient Medications (Analgesics):    Adalimumab (HUMIRA PEN) 40 MG/0.4ML PNKT, Inject 0.4 mLs into the skin every  Tuesday. Tuesday   aspirin  EC 81 MG tablet, Take 1 tablet (81 mg total) by mouth daily. Swallow whole.   traMADol  (ULTRAM ) 50 MG tablet, Take 50 mg by mouth every 6 (six) hours as needed (pain.).  Current Outpatient Medications (Hematological):    clopidogrel  (PLAVIX ) 75 MG tablet, Take 1 tablet (75 mg total) by mouth daily.   cyanocobalamin (VITAMIN B12) 1000 MCG tablet, Take 1,000 mcg by mouth daily.   folic acid  (FOLVITE ) 800 MCG tablet, Take 800 mcg by mouth daily.  Current Outpatient Medications (Other):    Cholecalciferol  (VITAMIN D3 PO), Take 1 tablet by mouth daily.   diclofenac Sodium (VOLTAREN) 1 % GEL, Apply 2 g topically. OTC (Patient taking differently: Apply 2 g topically as needed. OTC)   leucovorin (WELLCOVORIN) 5 MG tablet, Take 15 mg by mouth every Saturday.   methotrexate  50 MG/2ML injection, Inject 1 mL (25 mg total) into the muscle every Saturday. Hold methotrexate  for 2 more weeks and then resume 3/10   Multiple Vitamin (MULTIVITAMIN WITH MINERALS) TABS tablet, Take 1 tablet by mouth daily.   Na Sulfate-K Sulfate-Mg Sulfate concentrate (SUPREP BOWEL PREP KIT) 17.5-3.13-1.6 GM/177ML SOLN, Take 1 kit (354 mLs total) by mouth as directed. For colonoscopy prep   omeprazole (PRILOSEC) 40 MG capsule, Take 1 capsule (40 mg total) by mouth daily.   Polyethyl Glycol-Propyl Glycol (LUBRICANT EYE DROPS) 0.4-0.3 % SOLN, Place 1 drop into both eyes 3 (three) times daily as needed (dry/irritated eyes.).  Medical History:  Past Medical History:  Diagnosis Date   Anemia    CHF (congestive heart failure) (HCC)    Coronary artery disease    Diabetes mellitus without complication (HCC)    Hypertension    ILD (interstitial lung disease) (HCC)    Ischemic cardiomyopathy 06/12/2021   EF 25-30%   Pulmonary HTN (HCC)    Rheumatoid arthritis (HCC)    Allergies:  Allergies  Allergen Reactions   Penicillins Rash     Surgical History:  She  has a past surgical history that includes  RIGHT/LEFT HEART CATH AND CORONARY ANGIOGRAPHY (N/A, 06/16/2021); CORONARY ATHERECTOMY (N/A, 07/17/2021); Coronary angioplasty; and Breast biopsy (Left). Family History:  Her family history includes Autoimmune disease in her daughter; Brain cancer in her father; Healthy in her mother; Heart disease in her father; Kidney disease in her brother.  REVIEW OF SYSTEMS  : All other systems reviewed and negative except where noted in the  History of Present Illness.  PHYSICAL EXAM: BP 122/68   Pulse 88   Ht 5' 7 (1.702 m)   Wt 161 lb (73 kg)   BMI 25.22 kg/m  Physical Exam   GENERAL APPEARANCE: Well nourished, in no apparent distress. HEENT: No cervical lymphadenopathy, unremarkable thyroid, sclerae anicteric, conjunctiva pink. RESPIRATORY: Respiratory effort normal, breath sounds equal bilaterally without rales, rhonchi, or wheezing. CARDIO: Regular rhythm with mild mitral regurgitation, peripheral pulses intact. ABDOMEN: Soft, non-distended, active bowel sounds in all four quadrants, non-tender, no rebound, no mass appreciated, no diarrhea, no constipation. RECTAL: Declines. MUSCULOSKELETAL: Full range of motion, normal gait, without edema. SKIN: Dry, intact without rashes or lesions. No jaundice. NEURO: Alert, oriented, no focal deficits. PSYCH: Cooperative, normal mood and affect.      Alan JONELLE Coombs, PA-C 4:02 PM

## 2024-01-29 NOTE — Telephone Encounter (Signed)
 Request for surgical clearance:     Endoscopy Procedure  What type of surgery is being performed?     Colon/EGD   When is this surgery scheduled?     03/05/24  What type of clearance is required ?   Pharmacy  Are there any medications that need to be held prior to surgery and how long? Plavix    Practice name and name of physician performing surgery?      [facility]  What is your office phone and fax number?      Phone- [phone]  Fax- [phone]  Anesthesia type (None, local, MAC, general) ?       MAC  Please route your response to Blondie Barks, CMA

## 2024-01-30 NOTE — Telephone Encounter (Signed)
   Name: Veronica Romero  DOB: [birth year]  MRN: [id number]   Primary Cardiologist: Kardie Tobb, DO  Chart reviewed as part of pre-operative protocol coverage.   We have been asked for guidance to hold plavix  for endoscopy.   Pt has a history of OCT guided complex atherectomy to ostial-mid LAD with DES 3.0 x 30 mm in 06/2021. She has residual 50% stenosis in the mid RCA and distal LCX.   Recommend holding plavix  5 days with continuation of ASA.   I will route this recommendation to the requesting party via Epic fax function and remove from pre-op pool. Please call with questions.  Jon Garre Judythe Postema, PA 01/30/2024, 10:23 AM

## 2024-01-31 NOTE — Unmapped (Signed)
 [facility] Refill Coordination Note    Specialty Medication(s) to be Shipped:   Inflammatory Disorders: Humira     Other medication(s) to be shipped: No additional medications requested for fill at this time    Specialty Medications not needed at this time: N/A     Amber Stafford, DOB: 1950/07/25  Phone: [phone] (home)       All above HIPAA information was verified with patient.     Was a Nurse, learning disability used for this call? No    Completed refill call assessment today to schedule patient's medication shipment from the [facility]  [phone]).  All relevant notes have been reviewed.     Specialty medication(s) and dose(s) confirmed: Regimen is correct and unchanged.   Changes to medications: will be starting an Iron infusion in two weeks  Changes to insurance: No  New side effects reported not previously addressed with a pharmacist or physician: None reported  Questions for the pharmacist: No    Confirmed patient received a Conservation officer, historic buildings and a Surveyor, mining with first shipment. The patient will receive a drug information handout for each medication shipped and additional FDA Medication Guides as required.       DISEASE/MEDICATION-SPECIFIC INFORMATION        For patients on injectable medications: Next injection is scheduled for 10.4.25.    SPECIALTY MEDICATION ADHERENCE     Medication Adherence    Patient reported X missed doses in the last month: 0  Specialty Medication: adalimumab : HUMIRA (CF) PEN 40 mg/0.4 mL injection  Patient is on additional specialty medications: No              Were doses missed due to medication being on hold? No        adalimumab : HUMIRA (CF) PEN 40 mg/0.4 mL injection: 1 doses of medicine on hand     REFERRAL TO PHARMACIST     Referral to the pharmacist: Not needed      SHIPPING     Shipping address confirmed in Epic.     Cost and Payment: Patient has a $0 copay, payment information is not required.    Delivery Scheduled: Yes, Expected medication delivery date: 10.9.25.     Medication will be delivered via UPS to the prescription address in Epic WAM.    Amber Stafford   [facility]  Specialty Technician

## 2024-02-04 NOTE — Telephone Encounter (Signed)
 Patient informed per Cardiology-  Okay to hold  plavix  5 days prior to procedure with continuation of ASA.   Patient voiced understanding.

## 2024-02-05 MED FILL — HUMIRA PEN CITRATE FREE 40 MG/0.4 ML: SUBCUTANEOUS | 84 days supply | Qty: 12 | Fill #2

## 2024-02-06 DIAGNOSIS — M059 Rheumatoid arthritis with rheumatoid factor, unspecified: Principal | ICD-10-CM

## 2024-02-06 MED ORDER — TRAMADOL 50 MG TABLET
ORAL_TABLET | Freq: Three times a day (TID) | ORAL | 0 refills | 30.00000 days | PRN
Start: 2024-02-06 — End: ?

## 2024-02-06 NOTE — Progress Notes (Signed)
 Agree with the assessment and plan as outlined by Quentin Mulling, PA-C. ? ?Keron Neenan, DO, FACG ? ?

## 2024-02-07 MED FILL — LEUCOVORIN CALCIUM 5 MG TABLET: ORAL | 84 days supply | Qty: 36 | Fill #3

## 2024-02-07 MED FILL — BD TUBERCULIN SYRINGE 1 ML 27 X 1/2": 84 days supply | Qty: 12 | Fill #4

## 2024-02-07 NOTE — Unmapped (Signed)
 Tramadol  refill    Last ov: Visit date not found  Next ov: 06/04/2024

## 2024-02-07 NOTE — Telephone Encounter (Signed)
 Tramadol  refill    Last ov: Visit date not found  Next ov: 06/04/2024

## 2024-02-10 MED ORDER — TRAMADOL 50 MG TABLET
ORAL_TABLET | Freq: Three times a day (TID) | ORAL | 0 refills | 30.00000 days | Status: CP | PRN
Start: 2024-02-10 — End: ?
  Filled 2024-04-14: qty 90, 30d supply, fill #0

## 2024-02-12 ENCOUNTER — Ambulatory Visit

## 2024-02-14 NOTE — Progress Notes (Unsigned)
 [facility] OFFICE PROGRESS NOTE  Melvin Pao, NP [address] [place] [address]  DIAGNOSIS: Iron Deficiency Anemia  PRIOR THERAPY: None  CURRENT THERAPY: 1) Feraheme scheduled for 02/19/24 and 02/23/24 2) Oral iron supplement with slow release iron p.o. daily.   INTERVAL HISTORY: Veronica Romero 73 y.o. female returns to the clinic today for a follow-up visit.  The patient establish care in the clinic on 01/20/24.  The patient had significant anemia at that time.  We ordered IV iron infusions but did not receive her first dose until 02/19/2024 and her next dose is scheduled for 02/23/2024. She was out of town at [employer].   However, she did start taking slow release iron which she tolerates.   She is scheduled for colonoscopy and EGD by Dr. San on 03/05/24.   Her energy is better than she was but she does get tired towards the end of the day. She is not dizzy or lightheaded. Last time I saw her she had discontinued some of her antihypertensives which had made her feel poorly.  She denies any visible abnormal bleeding or bruising.  She is on Plavix  and aspirin .  She denies any fever, chills, night sweats, unexplained weight loss.  She denies any shortness of breath or syncope.  She is here today for evaluation and repeat blood work.    MEDICAL HISTORY: Past Medical History:  Diagnosis Date   Anemia    CHF (congestive heart failure) (HCC)    Coronary artery disease    Diabetes mellitus without complication (HCC)    Hypertension    ILD (interstitial lung disease) (HCC)    Ischemic cardiomyopathy 06/12/2021   EF 25-30%   Pulmonary HTN (HCC)    Rheumatoid arthritis (HCC)     ALLERGIES:  is allergic to penicillins.  MEDICATIONS:  Current Outpatient Medications  Medication Sig Dispense Refill   Adalimumab (HUMIRA PEN) 40 MG/0.4ML PNKT Inject 0.4 mLs into the skin every Tuesday. Tuesday     aspirin  EC 81 MG tablet Take 1 tablet (81 mg total) by  mouth daily. Swallow whole. 90 tablet 1   carvedilol  (COREG ) 3.125 MG tablet Take 1 tablet (3.125 mg total) by mouth 2 (two) times daily with a meal. (Patient not taking: Reported on 01/29/2024) 90 tablet 2   Cholecalciferol  (VITAMIN D3 PO) Take 1 tablet by mouth daily.     clopidogrel  (PLAVIX ) 75 MG tablet Take 1 tablet (75 mg total) by mouth daily. 90 tablet 1   cyanocobalamin (VITAMIN B12) 1000 MCG tablet Take 1,000 mcg by mouth daily.     diclofenac Sodium (VOLTAREN) 1 % GEL Apply 2 g topically. OTC (Patient taking differently: Apply 2 g topically as needed. OTC)     folic acid  (FOLVITE ) 800 MCG tablet Take 800 mcg by mouth daily.     leucovorin (WELLCOVORIN) 5 MG tablet Take 15 mg by mouth every Saturday.     methotrexate  50 MG/2ML injection Inject 1 mL (25 mg total) into the muscle every Saturday. Hold methotrexate  for 2 more weeks and then resume 3/10     Multiple Vitamin (MULTIVITAMIN WITH MINERALS) TABS tablet Take 1 tablet by mouth daily.     Na Sulfate-K Sulfate-Mg Sulfate concentrate (SUPREP BOWEL PREP KIT) 17.5-3.13-1.6 GM/177ML SOLN Take 1 kit (354 mLs total) by mouth as directed. For colonoscopy prep 354 mL 0   nitroGLYCERIN  (NITROSTAT ) 0.4 MG SL tablet Place 1 tablet (0.4 mg total) under the tongue every 5 (five) minutes as  needed. 25 tablet 2   omeprazole (PRILOSEC) 40 MG capsule Take 1 capsule (40 mg total) by mouth daily. 90 capsule 3   Polyethyl Glycol-Propyl Glycol (LUBRICANT EYE DROPS) 0.4-0.3 % SOLN Place 1 drop into both eyes 3 (three) times daily as needed (dry/irritated eyes.).     predniSONE  (DELTASONE ) 5 MG tablet Take 10 mg by mouth in the morning.     rosuvastatin  (CRESTOR ) 40 MG tablet Take 1 tablet (40 mg total) by mouth daily. 90 tablet 1   sacubitril-valsartan (ENTRESTO ) 24-26 MG Take 1 tablet by mouth 2 (two) times daily. 180 tablet 3   spironolactone  (ALDACTONE ) 25 MG tablet Take 0.5 tablets (12.5 mg total) by mouth daily. Take additional Tablet 12.5 mg for  swelling 135 tablet 2   traMADol  (ULTRAM ) 50 MG tablet Take 50 mg by mouth every 6 (six) hours as needed (pain.).     No current facility-administered medications for this visit.    SURGICAL HISTORY:  Past Surgical History:  Procedure Laterality Date   BREAST BIOPSY Left    ?when//benign   CORONARY ANGIOPLASTY     CORONARY ATHERECTOMY N/A 07/17/2021   Procedure: CORONARY ATHERECTOMY;  Surgeon: [place]az[place], Peter M, MD;  Location: [facility] INVASIVE CV LAB;  Service: Cardiovascular;  Laterality: N/A;   RIGHT/LEFT HEART CATH AND CORONARY ANGIOGRAPHY N/A 06/16/2021   Procedure: RIGHT/LEFT HEART CATH AND CORONARY ANGIOGRAPHY;  Surgeon: Claudene Victory ORN, MD;  Location: [facility] INVASIVE CV LAB;  Service: Cardiovascular;  Laterality: N/A;    REVIEW OF SYSTEMS:   Review of Systems  Constitutional: Positive for fatigue.  Negative for appetite change, chills, fever and unexpected weight change.  HENT: Negative for mouth sores, nosebleeds, sore throat and trouble swallowing.   Eyes: Negative for eye problems and icterus.  Respiratory: Negative for cough, hemoptysis, and wheezing.   Cardiovascular: Negative for chest pain and leg swelling.  Gastrointestinal: Negative for abdominal pain, constipation, diarrhea, nausea and vomiting.  Genitourinary: Negative for bladder incontinence, difficulty urinating, dysuria, frequency and hematuria.   Musculoskeletal: Negative for back pain, gait problem, neck pain and neck stiffness.  Skin: Negative for itching and rash.  Neurological: Improved lightheadedness/dizziness. Negative for extremity weakness, gait problem, headaches, and seizures.  Hematological: Negative for adenopathy. Does not bruise/bleed easily.  Psychiatric/Behavioral: Negative for confusion, depression and sleep disturbance. The patient is not nervous/anxious.      PHYSICAL EXAMINATION:  Blood pressure (!) 140/51, pulse 90, temperature (!) 97.3 F (36.3 C), temperature source Temporal, resp. rate 17,  height 5' 7 (1.702 m), weight 164 lb (74.4 kg), SpO2 98%.  ECOG PERFORMANCE STATUS: 1  Physical Exam  Constitutional: Oriented to person, place, and time and well-developed, well-nourished, and in no distress. HENT:  Head: Normocephalic and atraumatic.  Mouth/Throat: Oropharynx is clear and moist. No oropharyngeal exudate.  Eyes: Conjunctivae are normal. Right eye exhibits no discharge. Left eye exhibits no discharge. No scleral icterus.  Neck: Normal range of motion. Neck supple.  Cardiovascular: Normal rate, regular rhythm, normal heart sounds and intact distal pulses.   Pulmonary/Chest: Effort normal and breath sounds normal. No respiratory distress. No wheezes. No rales.  Abdominal: Soft. Bowel sounds are normal. Exhibits no distension and no mass. There is no tenderness.  Musculoskeletal: Normal range of motion. Exhibits no edema. Positive ulnar deviation Lymphadenopathy:    No cervical adenopathy.  Neurological: Alert and oriented to person, place, and time. Exhibits normal muscle tone. Gait normal. Coordination normal.  Skin: Skin is warm and dry. No rash noted. Not diaphoretic.  No erythema. Positive for pallor.  Psychiatric: Mood, memory and judgment normal.  Vitals reviewed.  LABORATORY DATA: Lab Results  Component Value Date   WBC 6.8 02/18/2024   HGB 9.4 (L) 02/18/2024   HCT 29.7 (L) 02/18/2024   MCV 90.8 02/18/2024   PLT 252 02/18/2024      Chemistry      Component Value Date/Time   NA 135 01/20/2024 1308   NA 139 12/17/2023 1424   K 4.1 01/20/2024 1308   CL 104 01/20/2024 1308   CO2 25 01/20/2024 1308   BUN 20 01/20/2024 1308   BUN 19 12/17/2023 1424   CREATININE 0.77 01/20/2024 1308      Component Value Date/Time   CALCIUM  9.4 01/20/2024 1308   ALKPHOS 62 01/20/2024 1308   AST 29 01/20/2024 1308   ALT 18 01/20/2024 1308   BILITOT 0.4 01/20/2024 1308       RADIOGRAPHIC STUDIES:  No results found.   ASSESSMENT/PLAN:  This is a very pleasant  73 year old female referred to the clinic for iron deficiency anemia.  She is scheduled for IV iron infusions with Feraheme next week on 02/23/2024 and tomorrow on 02/19/24.   She is scheduled for EGD and colonoscopy on 03/05/2024.  Labs were reviewed.  Her hemoglobin is 9.4. Her iron studies are pending but she will get her iron infusions tomorrow.   We will see her back for labs and a follow-up visit in 6-8 weeks  She is advised to keep taking her oral iron supplement with vitamin C.  The patient was advised to call immediately if she has any concerning symptoms in the interval. The patient voices understanding of current disease status and treatment options and is in agreement with the current care plan. All questions were answered. The patient knows to call the clinic with any problems, questions or concerns. We can certainly see the patient much sooner if necessary   Orders Placed This Encounter  Procedures   CBC with Differential (Cancer Center Only)    Standing Status:   Future    Expected Date:   04/17/2024    Expiration Date:   02/17/2025   Ferritin    Standing Status:   Future    Expected Date:   04/17/2024    Expiration Date:   02/17/2025   Iron and Iron Binding Capacity ([facility] only)    Standing Status:   Future    Expected Date:   04/17/2024    Expiration Date:   02/17/2025   Sample to Blood Bank    Standing Status:   Future    Expected Date:   04/17/2024    Expiration Date:   02/17/2025    The total time spent in the appointment was 20-29 minutes  Chalese Peach L Jeweliana Dudgeon, PA-C 02/18/24

## 2024-02-18 ENCOUNTER — Inpatient Hospital Stay: Attending: Physician Assistant

## 2024-02-18 ENCOUNTER — Inpatient Hospital Stay: Admitting: Physician Assistant

## 2024-02-18 VITALS — BP 140/51 | HR 90 | Temp 97.3°F | Resp 17 | Ht 67.0 in | Wt 164.0 lb

## 2024-02-18 DIAGNOSIS — D509 Iron deficiency anemia, unspecified: Secondary | ICD-10-CM | POA: Diagnosis present

## 2024-02-18 DIAGNOSIS — D649 Anemia, unspecified: Secondary | ICD-10-CM

## 2024-02-18 LAB — IRON AND IRON BINDING CAPACITY (CC-WL,HP ONLY)
Iron: 106 ug/dL (ref 28–170)
Saturation Ratios: 24 % (ref 10.4–31.8)
TIBC: 440 ug/dL (ref 250–450)
UIBC: 334 ug/dL (ref 148–442)

## 2024-02-18 LAB — CBC WITH DIFFERENTIAL (CANCER CENTER ONLY)
Abs Immature Granulocytes: 0.02 K/uL (ref 0.00–0.07)
Basophils Absolute: 0.1 K/uL (ref 0.0–0.1)
Basophils Relative: 1 %
Eosinophils Absolute: 0.3 K/uL (ref 0.0–0.5)
Eosinophils Relative: 4 %
HCT: 29.7 % — ABNORMAL LOW (ref 36.0–46.0)
Hemoglobin: 9.4 g/dL — ABNORMAL LOW (ref 12.0–15.0)
Immature Granulocytes: 0 %
Lymphocytes Relative: 40 %
Lymphs Abs: 2.7 K/uL (ref 0.7–4.0)
MCH: 28.7 pg (ref 26.0–34.0)
MCHC: 31.6 g/dL (ref 30.0–36.0)
MCV: 90.8 fL (ref 80.0–100.0)
Monocytes Absolute: 0.8 K/uL (ref 0.1–1.0)
Monocytes Relative: 12 %
Neutro Abs: 3 K/uL (ref 1.7–7.7)
Neutrophils Relative %: 43 %
Platelet Count: 252 K/uL (ref 150–400)
RBC: 3.27 MIL/uL — ABNORMAL LOW (ref 3.87–5.11)
RDW: 25.6 % — ABNORMAL HIGH (ref 11.5–15.5)
WBC Count: 6.8 K/uL (ref 4.0–10.5)
nRBC: 0 % (ref 0.0–0.2)

## 2024-02-18 LAB — CMP (CANCER CENTER ONLY)
ALT: 21 U/L (ref 0–44)
AST: 29 U/L (ref 15–41)
Albumin: 4.1 g/dL (ref 3.5–5.0)
Alkaline Phosphatase: 53 U/L (ref 38–126)
Anion gap: 6 (ref 5–15)
BUN: 18 mg/dL (ref 8–23)
CO2: 24 mmol/L (ref 22–32)
Calcium: 9.7 mg/dL (ref 8.9–10.3)
Chloride: 108 mmol/L (ref 98–111)
Creatinine: 0.78 mg/dL (ref 0.44–1.00)
GFR, Estimated: >60 mL/min (ref >60)
Glucose, Bld: 125 mg/dL — ABNORMAL HIGH (ref 70–99)
Potassium: 4.3 mmol/L (ref 3.5–5.1)
Sodium: 138 mmol/L (ref 135–145)
Total Bilirubin: 0.3 mg/dL (ref 0.0–1.2)
Total Protein: 8 g/dL (ref 6.5–8.1)

## 2024-02-18 LAB — SAMPLE TO BLOOD BANK

## 2024-02-18 LAB — FERRITIN: Ferritin: 61 ng/mL (ref 11–307)

## 2024-02-19 ENCOUNTER — Ambulatory Visit (INDEPENDENT_AMBULATORY_CARE_PROVIDER_SITE_OTHER)

## 2024-02-19 VITALS — BP 145/55 | HR 76 | Temp 98.0°F | Resp 18 | Ht 66.0 in | Wt 163.6 lb

## 2024-02-19 DIAGNOSIS — D509 Iron deficiency anemia, unspecified: Secondary | ICD-10-CM | POA: Diagnosis not present

## 2024-02-19 MED ORDER — SODIUM CHLORIDE 0.9 % IV SOLN
510.0000 mg | Freq: Once | INTRAVENOUS | Status: AC
  Administered 2024-02-19: 510 mg via INTRAVENOUS
  Filled 2024-02-19: qty 17

## 2024-02-19 MED ORDER — DIPHENHYDRAMINE HCL 25 MG PO CAPS
25.0000 mg | ORAL_CAPSULE | Freq: Once | ORAL | Status: AC
  Administered 2024-02-19: 25 mg via ORAL
  Filled 2024-02-19: qty 1

## 2024-02-19 MED ORDER — ACETAMINOPHEN 325 MG PO TABS
650.0000 mg | ORAL_TABLET | Freq: Once | ORAL | Status: AC
  Administered 2024-02-19: 650 mg via ORAL
  Filled 2024-02-19: qty 2

## 2024-02-19 NOTE — Progress Notes (Signed)
 Diagnosis: Iron Deficiency Anemia  Provider:  Mannam, Praveen MD  Procedure: IV Infusion  IV Type: Peripheral, IV Location: R Antecubital  Feraheme (Ferumoxytol), Dose: 510 mg  Infusion Start Time: 1338  Infusion Stop Time: 1354  Post Infusion IV Care: Observation period completed  Discharge: Condition: Good, Destination: Home . AVS Declined  Performed by:  Rocky FORBES Sar, RN

## 2024-02-21 ENCOUNTER — Ambulatory Visit
Admission: RE | Admit: 2024-02-21 | Discharge: 2024-02-21 | Disposition: A | Discharge status: Home / Self Care | Source: Ambulatory Visit | Attending: Nurse Practitioner | Admitting: Nurse Practitioner

## 2024-02-21 DIAGNOSIS — Z1231 Encounter for screening mammogram for malignant neoplasm of breast: Secondary | ICD-10-CM

## 2024-02-26 ENCOUNTER — Ambulatory Visit

## 2024-02-26 VITALS — BP 125/73 | HR 78 | Temp 98.1°F | Resp 18 | Ht 66.0 in | Wt 164.4 lb

## 2024-02-26 DIAGNOSIS — D509 Iron deficiency anemia, unspecified: Secondary | ICD-10-CM

## 2024-02-26 MED ORDER — SODIUM CHLORIDE 0.9 % IV SOLN
510.0000 mg | Freq: Once | INTRAVENOUS | Status: AC
  Administered 2024-02-26: 510 mg via INTRAVENOUS
  Filled 2024-02-26: qty 17

## 2024-02-26 MED ORDER — ACETAMINOPHEN 325 MG PO TABS
650.0000 mg | ORAL_TABLET | Freq: Once | ORAL | Status: AC
  Administered 2024-02-26: 650 mg via ORAL
  Filled 2024-02-26: qty 2

## 2024-02-26 MED ORDER — DIPHENHYDRAMINE HCL 25 MG PO CAPS: 25.0000 mg | ORAL_CAPSULE | Freq: Once | ORAL | Status: DC

## 2024-02-26 NOTE — Progress Notes (Signed)
 Diagnosis: Iron Deficiency Anemia  Provider:  Mannam, Praveen MD  Procedure: IV Infusion  IV Type: Peripheral, IV Location: R Antecubital  Feraheme (Ferumoxytol), Dose: 510 mg  Infusion Start Time: 1526  Infusion Stop Time: 1545  Post Infusion IV Care: Observation period completed and Peripheral IV Discontinued. 10 minute observation per patient request.  Discharge: Condition: Stable, Destination: Home . AVS Declined  Performed by:  Rocky FORBES Sar, RN

## 2024-03-05 ENCOUNTER — Encounter: Payer: Self-pay | Admitting: Gastroenterology

## 2024-03-05 ENCOUNTER — Ambulatory Visit (AMBULATORY_SURGERY_CENTER): Admitting: Gastroenterology

## 2024-03-05 VITALS — BP 139/57 | HR 71 | Temp 98.1°F | Resp 14 | Ht 67.0 in | Wt 161.0 lb

## 2024-03-05 DIAGNOSIS — D125 Benign neoplasm of sigmoid colon: Secondary | ICD-10-CM

## 2024-03-05 DIAGNOSIS — K222 Esophageal obstruction: Secondary | ICD-10-CM

## 2024-03-05 DIAGNOSIS — D122 Benign neoplasm of ascending colon: Secondary | ICD-10-CM

## 2024-03-05 DIAGNOSIS — K449 Diaphragmatic hernia without obstruction or gangrene: Secondary | ICD-10-CM

## 2024-03-05 DIAGNOSIS — K2950 Unspecified chronic gastritis without bleeding: Secondary | ICD-10-CM

## 2024-03-05 DIAGNOSIS — K5730 Diverticulosis of large intestine without perforation or abscess without bleeding: Secondary | ICD-10-CM

## 2024-03-05 DIAGNOSIS — K219 Gastro-esophageal reflux disease without esophagitis: Secondary | ICD-10-CM

## 2024-03-05 DIAGNOSIS — D509 Iron deficiency anemia, unspecified: Secondary | ICD-10-CM

## 2024-03-05 MED ORDER — SODIUM CHLORIDE 0.9 % IV SOLN: 500.0000 mL | INTRAVENOUS | Status: DC

## 2024-03-05 NOTE — Op Note (Signed)
 [facility] Patient Name: Veronica Romero Procedure Date: 03/05/2024 2:02 PM MRN: [id number] Endoscopist: Sandor Flatter , MD, [id number] Age: 73 Referring MD:  Date of Birth: 11-06-1950 Gender: Female Account #: [id number] Procedure:                Colonoscopy Indications:              Iron deficiency anemia Medicines:                Monitored Anesthesia Care Procedure:                Pre-Anesthesia Assessment:                           - Prior to the procedure, a History and Physical                            was performed, and patient medications and                            allergies were reviewed. The patient's tolerance of                            previous anesthesia was also reviewed. The risks                            and benefits of the procedure and the sedation                            options and risks were discussed with the patient.                            All questions were answered, and informed consent                            was obtained. Prior Anticoagulants: The patient has                            taken Plavix  (clopidogrel ), last dose was 5 days                            prior to procedure. ASA Grade Assessment: II - A                            patient with mild systemic disease. After reviewing                            the risks and benefits, the patient was deemed in                            satisfactory condition to undergo the procedure.                           After obtaining informed consent, the colonoscope  was passed under direct vision. Throughout the                            procedure, the patient's blood pressure, pulse, and                            oxygen saturations were monitored continuously. The                            Olympus Scope SN [id number] was introduced through the                            anus and advanced to the the terminal ileum. The                             colonoscopy was performed without difficulty. The                            patient tolerated the procedure well. The quality                            of the bowel preparation was good. The terminal                            ileum, ileocecal valve, appendiceal orifice, and                            rectum were photographed. Scope In: 2:38:01 PM Scope Out: 2:57:49 PM Scope Withdrawal Time: 0 hours 14 minutes 32 seconds  Total Procedure Duration: 0 hours 19 minutes 48 seconds  Findings:                 The perianal and digital rectal examinations were                            normal.                           A 3 mm polyp was found in the ascending colon. The                            polyp was sessile. The polyp was removed with a                            cold snare. Resection and retrieval were complete.                            Estimated blood loss was minimal.                           Two sessile polyps were found in the sigmoid colon.                            The polyps were 3 to 4 mm in  size. These polyps                            were removed with a cold snare. Resection and                            retrieval were complete. Estimated blood loss was                            minimal.                           A few small-mouthed diverticula were found in the                            sigmoid colon.                           The retroflexed view of the distal rectum and anal                            verge was normal and showed no anal or rectal                            abnormalities.                           The terminal ileum appeared normal. Complications:            No immediate complications. Estimated Blood Loss:     Estimated blood loss was minimal. Impression:               - One 3 mm polyp in the ascending colon, removed                            with a cold snare. Resected and retrieved.                           - Two 3 to 4 mm polyps in the  sigmoid colon,                            removed with a cold snare. Resected and retrieved.                           - Diverticulosis in the sigmoid colon.                           - The distal rectum and anal verge are normal on                            retroflexion view.                           - The examined portion of the ileum was normal. Recommendation:           - Patient has a contact  number available for                            emergencies. The signs and symptoms of potential                            delayed complications were discussed with the                            patient. Return to normal activities tomorrow.                            Written discharge instructions were provided to the                            patient.                           - Resume previous diet.                           - Continue present medications.                           - Await pathology results.                           - Repeat colonoscopy for surveillance based on                            pathology results.                           - Resume Plavix  (clopidogrel ) at prior dose                            tomorrow.                           - If continued or recurrent iron deficiency anemia,                            will plan on further small bowel interrogation with                            Video Capsule Endoscopy. Sandor Flatter, MD 03/05/2024 3:10:45 PM

## 2024-03-05 NOTE — Progress Notes (Signed)
 GASTROENTEROLOGY PROCEDURE H&P NOTE   Primary Care Physician: Melvin Pao, NP    Reason for Procedure:  Iron-deficiency anemia, GERD, hiatal hernia  Plan:    EGD, colonoscopy  Patient is appropriate for endoscopic procedure(s) in the ambulatory ([facility]) setting.  The nature of the procedure, as well as the risks, benefits, and alternatives were carefully and thoroughly reviewed with the patient. Ample time for discussion and questions allowed. The patient understood, was satisfied, and agreed to proceed. I personally addressed all patient questions and concerns.     HPI: Veronica Romero is a 73 y.o. female who presents for EGD and colonoscopy for evaluation of iron deficiency anemia along with evaluation for GERD and hiatal hernia.  Has been holding Plavix  for procedure today.  Still on aspirin .  No previous EGD or colonoscopy.  Was seen in the GI clinic for this issue on 01/29/2024 and subsequently referred to the Hematology Clinic, seen on 02/18/2024 and received IV iron x 2 since then.  Past Medical History:  Diagnosis Date   Anemia    Blood transfusion without reported diagnosis    Cataract    CHF (congestive heart failure) (HCC)    Coronary artery disease    Diabetes mellitus without complication (HCC)    Hypertension    ILD (interstitial lung disease) (HCC)    Ischemic cardiomyopathy 06/12/2021   EF 25-30%   Osteoporosis    Pulmonary HTN (HCC)    Rheumatoid arthritis (HCC)     Past Surgical History:  Procedure Laterality Date   BREAST BIOPSY Left    ?when//benign   CORONARY ANGIOPLASTY     CORONARY ATHERECTOMY N/A 07/17/2021   Procedure: CORONARY ATHERECTOMY;  Surgeon: Jordan, Peter M, MD;  Location: [facility] INVASIVE CV LAB;  Service: Cardiovascular;  Laterality: N/A;   RIGHT/LEFT HEART CATH AND CORONARY ANGIOGRAPHY N/A 06/16/2021   Procedure: RIGHT/LEFT HEART CATH AND CORONARY ANGIOGRAPHY;  Surgeon: Claudene Victory ORN, MD;  Location: [facility] INVASIVE CV LAB;  Service:  Cardiovascular;  Laterality: N/A;    Prior to Admission medications   Medication Sig Start Date End Date Taking? Authorizing Provider  aspirin  EC 81 MG tablet Take 1 tablet (81 mg total) by mouth daily. Swallow whole. 07/18/21  Yes Henry Manuelita NOVAK, NP  Cholecalciferol  (VITAMIN D3 PO) Take 1 tablet by mouth daily.   Yes [provider]  cyanocobalamin (VITAMIN B12) 1000 MCG tablet Take 1,000 mcg by mouth daily.   Yes [provider]  folic acid  (FOLVITE ) 800 MCG tablet Take 800 mcg by mouth daily.   Yes [provider]  Multiple Vitamin (MULTIVITAMIN WITH MINERALS) TABS tablet Take 1 tablet by mouth daily.   Yes [provider]  omeprazole (PRILOSEC) 40 MG capsule Take 1 capsule (40 mg total) by mouth daily. 01/29/24  Yes Craig Palma R, PA-C  Polyethyl Glycol-Propyl Glycol (LUBRICANT EYE DROPS) 0.4-0.3 % SOLN Place 1 drop into both eyes 3 (three) times daily as needed (dry/irritated eyes.).   Yes [provider]  predniSONE  (DELTASONE ) 5 MG tablet Take 10 mg by mouth in the morning. 04/12/21  Yes [provider]  spironolactone  (ALDACTONE ) 25 MG tablet Take 0.5 tablets (12.5 mg total) by mouth daily. Take additional Tablet 12.5 mg for swelling 10/01/22  Yes Duke, Jon Garre, PA  Adalimumab (HUMIRA PEN) 40 MG/0.4ML PNKT Inject 0.4 mLs into the skin every Tuesday. Tuesday 04/10/21   [provider]  carvedilol  (COREG ) 3.125 MG tablet Take 1 tablet (3.125 mg total) by mouth 2 (  two) times daily with a meal. Patient not taking: Reported on 01/20/2024 09/20/23   Tobb, Kardie, DO  clopidogrel  (PLAVIX ) 75 MG tablet Take 1 tablet (75 mg total) by mouth daily. 12/23/23   Tobb, Kardie, DO  diclofenac Sodium (VOLTAREN) 1 % GEL Apply 2 g topically. OTC Patient taking differently: Apply 2 g topically as needed. OTC 04/01/20   [provider]  leucovorin (WELLCOVORIN) 5 MG tablet Take 15 mg by mouth every Saturday. 04/07/21   [provider]  methotrexate  50 MG/2ML injection Inject 1 mL (25 mg total) into the muscle every Saturday. Hold methotrexate  for 2 more weeks and then resume 3/10 07/22/21   Henry Shaver B, NP  nitroGLYCERIN  (NITROSTAT ) 0.4 MG SL tablet Place 1 tablet (0.4 mg total) under the tongue every 5 (five) minutes as needed. 10/01/22   Duke, Jon Garre, PA  rosuvastatin  (CRESTOR ) 40 MG tablet Take 1 tablet (40 mg total) by mouth daily. 12/16/23   Tobb, Kardie, DO  sacubitril-valsartan (ENTRESTO ) 24-26 MG Take 1 tablet by mouth 2 (two) times daily. 03/01/22   Donette Ellouise LABOR, FNP  traMADol  (ULTRAM ) 50 MG tablet Take 50 mg by mouth every 6 (six) hours as needed (pain.).    [provider]    Current Outpatient Medications  Medication Sig Dispense Refill   aspirin  EC 81 MG tablet Take 1 tablet (81 mg total) by mouth daily. Swallow whole. 90 tablet 1   Cholecalciferol  (VITAMIN D3 PO) Take 1 tablet by mouth daily.     cyanocobalamin (VITAMIN B12) 1000 MCG tablet Take 1,000 mcg by mouth daily.     folic acid  (FOLVITE ) 800 MCG tablet Take 800 mcg by mouth daily.     Multiple Vitamin (MULTIVITAMIN WITH MINERALS) TABS tablet Take 1 tablet by mouth daily.     omeprazole (PRILOSEC) 40 MG capsule Take 1 capsule (40 mg total) by mouth daily. 90 capsule 3   Polyethyl Glycol-Propyl Glycol (LUBRICANT EYE DROPS) 0.4-0.3 % SOLN Place 1 drop into both eyes 3 (three) times daily as needed (dry/irritated eyes.).     predniSONE  (DELTASONE ) 5 MG tablet Take 10 mg by mouth in the morning.     spironolactone  (ALDACTONE ) 25 MG tablet Take 0.5 tablets (12.5 mg total) by mouth daily. Take additional Tablet 12.5 mg for swelling 135 tablet 2   Adalimumab (HUMIRA PEN) 40 MG/0.4ML PNKT Inject 0.4 mLs into the skin every Tuesday. Tuesday     carvedilol  (COREG ) 3.125 MG tablet Take 1 tablet (3.125 mg total) by mouth 2 (two) times daily with a meal. (Patient not taking: Reported on 01/20/2024) 90 tablet 2   clopidogrel  (PLAVIX )  75 MG tablet Take 1 tablet (75 mg total) by mouth daily. 90 tablet 1   diclofenac Sodium (VOLTAREN) 1 % GEL Apply 2 g topically. OTC (Patient taking differently: Apply 2 g topically as needed. OTC)     leucovorin (WELLCOVORIN) 5 MG tablet Take 15 mg by mouth every Saturday.     methotrexate  50 MG/2ML injection Inject 1 mL (25 mg total) into the muscle every Saturday. Hold methotrexate  for 2 more weeks and then resume 3/10     nitroGLYCERIN  (NITROSTAT ) 0.4 MG SL tablet Place 1 tablet (0.4 mg total) under the tongue every 5 (five) minutes as needed. 25 tablet 2   rosuvastatin  (CRESTOR ) 40 MG tablet Take 1 tablet (40 mg total) by mouth daily. 90 tablet 1   sacubitril-valsartan (ENTRESTO ) 24-26 MG Take 1 tablet by mouth 2 (two) times daily. 180  tablet 3   traMADol  (ULTRAM ) 50 MG tablet Take 50 mg by mouth every 6 (six) hours as needed (pain.).     Current Facility-Administered Medications  Medication Dose Route Frequency Provider Last Rate Last Admin   0.9 %  sodium chloride  infusion  500 mL Intravenous Continuous Kier Smead V, DO        Allergies as of 03/05/2024 - Review Complete 03/05/2024  Allergen Reaction Noted   Penicillins Rash 09/18/2012    Family History  Problem Relation Age of Onset   Healthy Mother    Heart disease Father    Brain cancer Father    Kidney disease Brother    Autoimmune disease Daughter    Colon cancer Neg Hx    Esophageal cancer Neg Hx    Stomach cancer Neg Hx    Rectal cancer Neg Hx     Social History   Socioeconomic History   Marital status: Widowed    Spouse name: Not on file   Number of children: 1   Years of education: Not on file   Highest education level: Some college, no degree  Occupational History   Occupation: retired  Tobacco Use   Smoking status: Never   Smokeless tobacco: Never  Vaping Use   Vaping status: Never Used  Substance and Sexual Activity   Alcohol  use: Yes    Alcohol /week: 2.0 standard drinks of alcohol     Types:  2 Glasses of wine per week    Comment: occasionally   Drug use: Never   Sexual activity: Not Currently  Other Topics Concern   Not on file  Social History Narrative   Not on file   Social Drivers of Health   Financial Resource Strain: Medium Risk (12/16/2023)   Overall Financial Resource Strain (CARDIA)    Difficulty of Paying Living Expenses: Somewhat hard  Food Insecurity: No Food Insecurity (01/20/2024)   Hunger Vital Sign    Worried About Running Out of Food in the Last Year: Never true    Ran Out of Food in the Last Year: Never true  Transportation Needs: No Transportation Needs (01/20/2024)   PRAPARE - Administrator, Civil Service (Medical): No    Lack of Transportation (Non-Medical): No  Physical Activity: Insufficiently Active (12/16/2023)   Exercise Vital Sign    Days of Exercise per Week: 2 days    Minutes of Exercise per Session: 10 min  Stress: Stress Concern Present (12/16/2023)   [employer] of Occupational Health - Occupational Stress Questionnaire    Feeling of Stress: To some extent  Social Connections: Socially Isolated (12/16/2023)   Social Connection and Isolation Panel    Frequency of Communication with Friends and Family: Three times a week    Frequency of Social Gatherings with Friends and Family: More than three times a week    Attends Religious Services: Never    Database Administrator or Organizations: No    Attends Banker Meetings: Not on file    Marital Status: Widowed  Intimate Partner Violence: Not At Risk (01/20/2024)   Humiliation, Afraid, Rape, and Kick questionnaire    Fear of Current or Ex-Partner: No    Emotionally Abused: No    Physically Abused: No    Sexually Abused: No    Physical Exam: Vital signs in last 24 hours: @BP  (!) 144/65   Pulse 77   Temp 98.1 F (36.7 C) (Temporal)   Ht 5' 7 (1.702 m)   Wt 161 lb (  73 kg)   SpO2 99%   BMI 25.22 kg/m  GEN: NAD EYE: Sclerae anicteric ENT: MMM CV:  Non-tachycardic Pulm: CTA b/l GI: Soft, NT/ND NEURO:  Alert & Oriented x 3   Sandor Flatter, DO [facility]   03/05/2024 2:08 PM

## 2024-03-05 NOTE — Progress Notes (Signed)
 Transferred to PACU via stretcher.  Not responding to stimulation at this time.  VSS upon leaving procedure room.

## 2024-03-05 NOTE — Patient Instructions (Addendum)
 Handouts given: Polyps, Diverticulosis, Hiatal hernia Resume previous diet. Continue present medications.  Await pathology results. Repeat upper endoscopy PRN for retreatment.  Repeat colonoscopy for surveillance based on pathology results. Resume Plavix  (clopidogrel ) at prior dose tomorrow. If continued or recurrent iron deficiency anemia, will plan on further small bowel interrogation with Video Capsule Endoscopy.   YOU HAD AN ENDOSCOPIC PROCEDURE TODAY AT THE [facility]:   Refer to the procedure report that was given to you for any specific questions about what was found during the examination.  If the procedure report does not answer your questions, please call your gastroenterologist to clarify.  If you requested that your care partner not be given the details of your procedure findings, then the procedure report has been included in a sealed envelope for you to review at your convenience later.  YOU SHOULD EXPECT: Some feelings of bloating in the abdomen. Passage of more gas than usual.  Walking can help get rid of the air that was put into your GI tract during the procedure and reduce the bloating. If you had a lower endoscopy (such as a colonoscopy or flexible sigmoidoscopy) you may notice spotting of blood in your stool or on the toilet paper. If you underwent a bowel prep for your procedure, you may not have a normal bowel movement for a few days.  Please Note:  You might notice some irritation and congestion in your nose or some drainage.  This is from the oxygen used during your procedure.  There is no need for concern and it should clear up in a day or so.  SYMPTOMS TO REPORT IMMEDIATELY:  Following lower endoscopy (colonoscopy or flexible sigmoidoscopy):  Excessive amounts of blood in the stool  Significant tenderness or worsening of abdominal pains  Swelling of the abdomen that is new, acute  Fever of 100F or higher  Following upper endoscopy (EGD)  Vomiting of  blood or coffee ground material  New chest pain or pain under the shoulder blades  Painful or persistently difficult swallowing  New shortness of breath  Fever of 100F or higher  Black, tarry-looking stools  For urgent or emergent issues, a gastroenterologist can be reached at any hour by calling [phone]. Do not use MyChart messaging for urgent concerns.    DIET:  We do recommend a small meal at first, but then you may proceed to your regular diet.  Drink plenty of fluids but you should avoid alcoholic beverages for 24 hours.  ACTIVITY:  You should plan to take it easy for the rest of today and you should NOT DRIVE or use heavy machinery until tomorrow (because of the sedation medicines used during the test).    FOLLOW UP: Our staff will call the number listed on your records the next business day following your procedure.  We will call around 7:15- 8:00 am to check on you and address any questions or concerns that you may have regarding the information given to you following your procedure. If we do not reach you, we will leave a message.     If any biopsies were taken you will be contacted by phone or by letter within the next 1-3 weeks.  Please call us  at [phone] if you have not heard about the biopsies in 3 weeks.    SIGNATURES/CONFIDENTIALITY: You and/or your care partner have signed paperwork which will be entered into your electronic medical record.  These signatures attest to the fact that that the information above  on your After Visit Summary has been reviewed and is understood.  Full responsibility of the confidentiality of this discharge information lies with you and/or your care-partner.

## 2024-03-05 NOTE — Progress Notes (Signed)
 Called to room to assist during endoscopic procedure.  Patient ID and intended procedure confirmed with present staff. Received instructions for my participation in the procedure from the performing physician.

## 2024-03-05 NOTE — Op Note (Signed)
 [facility] Patient Name: Veronica Romero Procedure Date: 03/05/2024 2:11 PM MRN: [id number] Endoscopist: Sandor Flatter , MD, [id number] Age: 73 Referring MD:  Date of Birth: [birth year] Gender: Female Account #: [id number] Procedure:                Upper GI endoscopy Indications:              Iron deficiency anemia, Suspected esophageal reflux Medicines:                Monitored Anesthesia Care Procedure:                Pre-Anesthesia Assessment:                           - Prior to the procedure, a History and Physical                            was performed, and patient medications and                            allergies were reviewed. The patient's tolerance of                            previous anesthesia was also reviewed. The risks                            and benefits of the procedure and the sedation                            options and risks were discussed with the patient.                            All questions were answered, and informed consent                            was obtained. Prior Anticoagulants: The patient has                            taken Plavix  (clopidogrel ), last dose was 5 days                            prior to procedure. ASA Grade Assessment: II - A                            patient with mild systemic disease. After reviewing                            the risks and benefits, the patient was deemed in                            satisfactory condition to undergo the procedure.                           After obtaining informed consent, the endoscope was  passed under direct vision. Throughout the                            procedure, the patient's blood pressure, pulse, and                            oxygen saturations were monitored continuously. The                            Olympus scope [id number] was introduced through the                            mouth, and advanced to the third part of duodenum.                             The upper GI endoscopy was accomplished without                            difficulty. The patient tolerated the procedure                            well. Scope In: Scope Out: Findings:                 One benign-appearing, intrinsic moderate stenosis                            was found 34 cm from the incisors. This stenosis                            measured 1 cm (in length). The stenosis was                            traversed. A TTS dilator was passed through the                            scope. Dilation with an 18-19-20 mm balloon dilator                            was performed to 18 mm. The dilation site was                            examined and showed mild mucosal disruption.                            Estimated blood loss was minimal.                           A 6 cm hiatal hernia was present.                           Normal mucosa was found in the entire examined  stomach. Biopsies were taken with a cold forceps                            for Helicobacter pylori testing. Estimated blood                            loss was minimal.                           The examined duodenum was normal. Biopsies were                            taken with a cold forceps for histology. Estimated                            blood loss was minimal. Complications:            No immediate complications. Estimated Blood Loss:     Estimated blood loss was minimal. Impression:               - Benign-appearing esophageal stenosis. Dilated                            with 18 mm TTS balloon.                           - 6 cm hiatal hernia.                           - Normal mucosa was found in the entire stomach.                            Biopsied.                           - Normal examined duodenum. Biopsied. Recommendation:           - Patient has a contact number available for                            emergencies. The signs and symptoms of  potential                            delayed complications were discussed with the                            patient. Return to normal activities tomorrow.                            Written discharge instructions were provided to the                            patient.                           - Resume previous diet.                           -  Continue present medications.                           - Await pathology results.                           - Repeat upper endoscopy PRN for retreatment.                           - Perform a colonoscopy today. Sandor Flatter, MD 03/05/2024 3:06:36 PM

## 2024-03-06 ENCOUNTER — Telehealth: Payer: Self-pay

## 2024-03-06 NOTE — Telephone Encounter (Signed)
  Follow up Call-     03/05/2024    1:35 PM  Call back number  Post procedure Call Back phone  # [phone]  Permission to leave phone message Yes     Patient questions:  Do you have a fever, pain , or abdominal swelling? No. Pain Score  0 *  Have you tolerated food without any problems? Yes.    Have you been able to return to your normal activities? Yes.    Do you have any questions about your discharge instructions: Diet   No. Medications  No. Follow up visit  No.  Do you have questions or concerns about your Care? No.  Actions: * If pain score is 4 or above: No action needed, pain <4.

## 2024-03-11 LAB — SURGICAL PATHOLOGY

## 2024-03-16 ENCOUNTER — Ambulatory Visit: Payer: Self-pay | Admitting: Gastroenterology

## 2024-04-02 NOTE — Progress Notes (Deleted)
 [facility] OFFICE PROGRESS NOTE  Melvin Pao, NP [address] [place] [address]  DIAGNOSIS: Iron Deficiency Anemia   PRIOR THERAPY: None  CURRENT THERAPY: 1) Feraheme  scheduled for 02/19/24 and 02/23/24 2) Oral iron supplement with slow release iron p.o. daily.  INTERVAL HISTORY: Veronica Romero 73 y.o. female returns to the clinic today for a follow-up visit.  The patient establish care in the clinic on 01/20/24.  The patient had significant anemia at that time.  She received IV iron on 10/22 and 10/26 and tolerated it ***.    However, she did start taking slow release iron which she tolerates.    She had colonoscopy and EGD by Dr. San on 03/05/24. This showed ***. Colonoscopy showed polps. The EGD did not show any signs of bleeding.    Her energy is *** but she does get tired towards the end of the day. She is not dizzy or lightheaded. ***Last time I saw her she had discontinued some of her antihypertensives which had made her feel poorly.  She denies any visible abnormal bleeding or bruising.  She is on Plavix  and aspirin .  She denies any fever, chills, night sweats, unexplained weight loss.  She denies any shortness of breath or syncope.  She is here today for evaluation and repeat blood work.    MEDICAL HISTORY: Past Medical History:  Diagnosis Date   Anemia    Blood transfusion without reported diagnosis    Cataract    CHF (congestive heart failure) (HCC)    Coronary artery disease    Diabetes mellitus without complication (HCC)    Hypertension    ILD (interstitial lung disease) (HCC)    Ischemic cardiomyopathy 06/12/2021   EF 25-30%   Osteoporosis    Pulmonary HTN (HCC)    Rheumatoid arthritis (HCC)     ALLERGIES:  is allergic to penicillins.  MEDICATIONS:  Current Outpatient Medications  Medication Sig Dispense Refill   Adalimumab (HUMIRA PEN) 40 MG/0.4ML PNKT Inject 0.4 mLs into the skin every Tuesday. Tuesday     aspirin  EC  81 MG tablet Take 1 tablet (81 mg total) by mouth daily. Swallow whole. 90 tablet 1   carvedilol  (COREG ) 3.125 MG tablet Take 1 tablet (3.125 mg total) by mouth 2 (two) times daily with a meal. (Patient not taking: Reported on 01/20/2024) 90 tablet 2   Cholecalciferol  (VITAMIN D3 PO) Take 1 tablet by mouth daily.     clopidogrel  (PLAVIX ) 75 MG tablet Take 1 tablet (75 mg total) by mouth daily. 90 tablet 1   cyanocobalamin  (VITAMIN B12) 1000 MCG tablet Take 1,000 mcg by mouth daily.     diclofenac Sodium (VOLTAREN) 1 % GEL Apply 2 g topically. OTC (Patient taking differently: Apply 2 g topically as needed. OTC)     folic acid  (FOLVITE ) 800 MCG tablet Take 800 mcg by mouth daily.     leucovorin (WELLCOVORIN) 5 MG tablet Take 15 mg by mouth every Saturday.     methotrexate  50 MG/2ML injection Inject 1 mL (25 mg total) into the muscle every Saturday. Hold methotrexate  for 2 more weeks and then resume 3/10     Multiple Vitamin (MULTIVITAMIN WITH MINERALS) TABS tablet Take 1 tablet by mouth daily.     nitroGLYCERIN  (NITROSTAT ) 0.4 MG SL tablet Place 1 tablet (0.4 mg total) under the tongue every 5 (five) minutes as needed. 25 tablet 2   omeprazole  (PRILOSEC) 40 MG capsule Take 1 capsule (40 mg total) by mouth  daily. 90 capsule 3   Polyethyl Glycol-Propyl Glycol (LUBRICANT EYE DROPS) 0.4-0.3 % SOLN Place 1 drop into both eyes 3 (three) times daily as needed (dry/irritated eyes.).     predniSONE  (DELTASONE ) 5 MG tablet Take 10 mg by mouth in the morning.     rosuvastatin  (CRESTOR ) 40 MG tablet Take 1 tablet (40 mg total) by mouth daily. 90 tablet 1   sacubitril-valsartan (ENTRESTO ) 24-26 MG Take 1 tablet by mouth 2 (two) times daily. 180 tablet 3   spironolactone  (ALDACTONE ) 25 MG tablet Take 0.5 tablets (12.5 mg total) by mouth daily. Take additional Tablet 12.5 mg for swelling 135 tablet 2   traMADol  (ULTRAM ) 50 MG tablet Take 50 mg by mouth every 6 (six) hours as needed (pain.).     No current  facility-administered medications for this visit.    SURGICAL HISTORY:  Past Surgical History:  Procedure Laterality Date   BREAST BIOPSY Left    ?when//benign   CORONARY ANGIOPLASTY     CORONARY ATHERECTOMY N/A 07/17/2021   Procedure: CORONARY ATHERECTOMY;  Surgeon: Jordan, Peter M, MD;  Location: [facility] INVASIVE CV LAB;  Service: Cardiovascular;  Laterality: N/A;   RIGHT/LEFT HEART CATH AND CORONARY ANGIOGRAPHY N/A 06/16/2021   Procedure: RIGHT/LEFT HEART CATH AND CORONARY ANGIOGRAPHY;  Surgeon: Claudene Victory ORN, MD;  Location: [facility] INVASIVE CV LAB;  Service: Cardiovascular;  Laterality: N/A;    REVIEW OF SYSTEMS:   Review of Systems  Constitutional: Negative for appetite change, chills, fatigue, fever and unexpected weight change.  HENT:   Negative for mouth sores, nosebleeds, sore throat and trouble swallowing.   Eyes: Negative for eye problems and icterus.  Respiratory: Negative for cough, hemoptysis, shortness of breath and wheezing.   Cardiovascular: Negative for chest pain and leg swelling.  Gastrointestinal: Negative for abdominal pain, constipation, diarrhea, nausea and vomiting.  Genitourinary: Negative for bladder incontinence, difficulty urinating, dysuria, frequency and hematuria.   Musculoskeletal: Negative for back pain, gait problem, neck pain and neck stiffness.  Skin: Negative for itching and rash.  Neurological: Negative for dizziness, extremity weakness, gait problem, headaches, light-headedness and seizures.  Hematological: Negative for adenopathy. Does not bruise/bleed easily.  Psychiatric/Behavioral: Negative for confusion, depression and sleep disturbance. The patient is not nervous/anxious.     PHYSICAL EXAMINATION:  There were no vitals taken for this visit.  ECOG PERFORMANCE STATUS: {CHL ONC ECOG [id number]  Physical Exam  Constitutional: Oriented to person, place, and time and well-developed, well-nourished, and in no distress. No distress.  HENT:   Head: Normocephalic and atraumatic.  Mouth/Throat: Oropharynx is clear and moist. No oropharyngeal exudate.  Eyes: Conjunctivae are normal. Right eye exhibits no discharge. Left eye exhibits no discharge. No scleral icterus.  Neck: Normal range of motion. Neck supple.  Cardiovascular: Normal rate, regular rhythm, normal heart sounds and intact distal pulses.   Pulmonary/Chest: Effort normal and breath sounds normal. No respiratory distress. No wheezes. No rales.  Abdominal: Soft. Bowel sounds are normal. Exhibits no distension and no mass. There is no tenderness.  Musculoskeletal: Normal range of motion. Exhibits no edema.  Lymphadenopathy:    No cervical adenopathy.  Neurological: Alert and oriented to person, place, and time. Exhibits normal muscle tone. Gait normal. Coordination normal.  Skin: Skin is warm and dry. No rash noted. Not diaphoretic. No erythema. No pallor.  Psychiatric: Mood, memory and judgment normal.  Vitals reviewed.  LABORATORY DATA: Lab Results  Component Value Date   WBC 6.8 02/18/2024   HGB 9.4 (L) 02/18/2024  HCT 29.7 (L) 02/18/2024   MCV 90.8 02/18/2024   PLT 252 02/18/2024      Chemistry      Component Value Date/Time   NA 138 02/18/2024 1441   NA 139 12/17/2023 1424   K 4.3 02/18/2024 1441   CL 108 02/18/2024 1441   CO2 24 02/18/2024 1441   BUN 18 02/18/2024 1441   BUN 19 12/17/2023 1424   CREATININE 0.78 02/18/2024 1441      Component Value Date/Time   CALCIUM  9.7 02/18/2024 1441   ALKPHOS 53 02/18/2024 1441   AST 29 02/18/2024 1441   ALT 21 02/18/2024 1441   BILITOT 0.3 02/18/2024 1441       RADIOGRAPHIC STUDIES:  No results found.   ASSESSMENT/PLAN:  This is a very pleasant 73 year old female referred to the clinic for iron deficiency anemia.   She has received Feraheme  on 02/23/2024 and 02/19/24.     Labs were reviewed.  Her hemoglobin is ***. Her iron studies are pending but she will get her iron infusions tomorrow.     We will see her back for labs and a follow-up visit in 2-3 months   She is advised to keep taking her oral iron supplement with vitamin C.  The patient was advised to call immediately if she has any concerning symptoms in the interval. The patient voices understanding of current disease status and treatment options and is in agreement with the current care plan. All questions were answered. The patient knows to call the clinic with any problems, questions or concerns. We can certainly see the patient much sooner if necessary  No orders of the defined types were placed in this encounter.    I spent {CHL ONC TIME VISIT - DTPQU:[id number]} counseling the patient face to face. The total time spent in the appointment was {CHL ONC TIME VISIT - DTPQU:[id number]}.  Chelsye Suhre L Aspen Deterding, PA-C 04/02/24

## 2024-04-07 ENCOUNTER — Inpatient Hospital Stay: Attending: Physician Assistant

## 2024-04-07 ENCOUNTER — Inpatient Hospital Stay: Admitting: Physician Assistant

## 2024-04-13 ENCOUNTER — Telehealth: Payer: Self-pay | Admitting: Physician Assistant

## 2024-04-13 NOTE — Telephone Encounter (Signed)
 Rescheduled patient from 12/9. Called and spoke with the patient, she is aware of new day and time.

## 2024-04-17 NOTE — Progress Notes (Signed)
 [facility] Refill Coordination Note    Specialty Medication(s) to be Shipped:   Inflammatory Disorders: Humira     Other medication(s) to be shipped: No additional medications requested for fill at this time    Specialty Medications not needed at this time: N/A     Amber Stafford, DOB: 09/16/1950  Phone: [phone] (home)       All above HIPAA information was verified with patient.     Was a nurse, learning disability used for this call? No    Completed refill call assessment today to schedule patient's medication shipment from the [facility]  [phone]).  All relevant notes have been reviewed.     Specialty medication(s) and dose(s) confirmed: Regimen is correct and unchanged.   Changes to medications: [name] reports no changes at this time.  Changes to insurance: No  New side effects reported not previously addressed with a pharmacist or physician: None reported  Questions for the pharmacist: No    Confirmed patient received a Conservation Officer, Historic Buildings and a Surveyor, Mining with first shipment. The patient will receive a drug information handout for each medication shipped and additional FDA Medication Guides as required.       DISEASE/MEDICATION-SPECIFIC INFORMATION        N/A    SPECIALTY MEDICATION ADHERENCE     Medication Adherence    Patient reported X missed doses in the last month: 0  Specialty Medication: adalimumab : HUMIRA (CF) PEN 40 mg/0.4 mL injection  Patient is on additional specialty medications: No              Were doses missed due to medication being on hold? No      adalimumab : HUMIRA (CF) PEN 40 mg/0.4 mL injection: 2 doses of medicine on hand       Specialty medication is an injection or given on a cycle: Yes, Next injection is scheduled for 12.27.25.    REFERRAL TO PHARMACIST     Referral to the pharmacist: Not needed      [facility]     Shipping address confirmed in Epic.     Cost and Payment: Patient has a $0 copay, payment information is not required.    Delivery Scheduled: Yes, Expected medication delivery date: 12.31.25.     Medication will be delivered via UPS to the prescription address in Epic WAM.    [name]   [facility]  Specialty Technician

## 2024-04-19 DIAGNOSIS — M8589 Other specified disorders of bone density and structure, multiple sites: Principal | ICD-10-CM

## 2024-04-19 DIAGNOSIS — M05742 Rheumatoid arthritis with rheumatoid factor of left hand without organ or systems involvement: Principal | ICD-10-CM

## 2024-04-19 DIAGNOSIS — M0579 Rheumatoid arthritis with rheumatoid factor of multiple sites without organ or systems involvement: Principal | ICD-10-CM

## 2024-04-19 DIAGNOSIS — M059 Rheumatoid arthritis with rheumatoid factor, unspecified: Principal | ICD-10-CM

## 2024-04-19 DIAGNOSIS — M05741 Rheumatoid arthritis with rheumatoid factor of right hand without organ or systems involvement: Principal | ICD-10-CM

## 2024-04-19 MED ORDER — LEUCOVORIN CALCIUM 5 MG TABLET
ORAL_TABLET | ORAL | 6 refills | 84.00000 days
Start: 2024-04-19 — End: ?

## 2024-04-20 MED ORDER — LEUCOVORIN CALCIUM 5 MG TABLET
ORAL_TABLET | ORAL | 3 refills | 84.00000 days | Status: CP
Start: 2024-04-20 — End: ?
  Filled 2024-04-28: qty 12, 28d supply, fill #0

## 2024-04-20 NOTE — Telephone Encounter (Signed)
 Leucovorin  refill    Last Visit Date: 02/06/2024  Next Visit Date: 06/04/2024    Lab Results   Component Value Date    ALT 11 07/09/2023    AST 24 07/09/2023    ALBUMIN 3.6 07/09/2023    CREATININE 0.84 07/09/2023     Lab Results   Component Value Date    WBC 8.1 07/09/2023    HGB 10.1 (L) 07/09/2023    HCT 29.6 (L) 07/09/2023    PLT 233 07/09/2023     Lab Results   Component Value Date    NEUTROPCT 59.2 07/09/2023    LYMPHOPCT 26.4 07/09/2023    MONOPCT 12.2 07/09/2023    EOSPCT 1.8 07/09/2023    BASOPCT 0.4 07/09/2023     No results found for: VITD

## 2024-04-24 NOTE — Progress Notes (Unsigned)
 [facility] OFFICE PROGRESS NOTE  Melvin Pao, NP [address] [place] [address]  DIAGNOSIS: Iron Deficiency Anemia   PRIOR THERAPY: None  CURRENT THERAPY: 1) Feraheme  scheduled for 02/19/24 and 02/23/24 2) Oral iron supplement with slow release iron p.o. daily.  INTERVAL HISTORY: Veronica Romero 73 y.o. female returns to the clinic today for a follow-up visit.  The patient establish care in the clinic on 01/20/24.  The patient had significant anemia at that time.  She received IV iron on 10/22 and 10/26 and tolerated it ***.    However, she did start taking slow release iron which she tolerates.    She had colonoscopy and EGD by Dr. San on 03/05/24. This showed ***. Colonoscopy showed polps. The EGD did not show any signs of bleeding.    Her energy is *** but she does get tired towards the end of the day. She is not dizzy or lightheaded. ***Last time I saw her she had discontinued some of her antihypertensives which had made her feel poorly.  She denies any visible abnormal bleeding or bruising.  She is on Plavix  and aspirin .  She denies any fever, chills, night sweats, unexplained weight loss.  She denies any shortness of breath or syncope.  She is here today for evaluation and repeat blood work.    MEDICAL HISTORY: Past Medical History:  Diagnosis Date   Anemia    Blood transfusion without reported diagnosis    Cataract    CHF (congestive heart failure) (HCC)    Coronary artery disease    Diabetes mellitus without complication (HCC)    Hypertension    ILD (interstitial lung disease) (HCC)    Ischemic cardiomyopathy 06/12/2021   EF 25-30%   Osteoporosis    Pulmonary HTN (HCC)    Rheumatoid arthritis (HCC)     ALLERGIES:  is allergic to penicillins.  MEDICATIONS:  Current Outpatient Medications  Medication Sig Dispense Refill   Adalimumab (HUMIRA PEN) 40 MG/0.4ML PNKT Inject 0.4 mLs into the skin every Tuesday. Tuesday     aspirin  EC  81 MG tablet Take 1 tablet (81 mg total) by mouth daily. Swallow whole. 90 tablet 1   carvedilol  (COREG ) 3.125 MG tablet Take 1 tablet (3.125 mg total) by mouth 2 (two) times daily with a meal. (Patient not taking: Reported on 01/20/2024) 90 tablet 2   Cholecalciferol  (VITAMIN D3 PO) Take 1 tablet by mouth daily.     clopidogrel  (PLAVIX ) 75 MG tablet Take 1 tablet (75 mg total) by mouth daily. 90 tablet 1   cyanocobalamin  (VITAMIN B12) 1000 MCG tablet Take 1,000 mcg by mouth daily.     diclofenac Sodium (VOLTAREN) 1 % GEL Apply 2 g topically. OTC (Patient taking differently: Apply 2 g topically as needed. OTC)     folic acid  (FOLVITE ) 800 MCG tablet Take 800 mcg by mouth daily.     leucovorin (WELLCOVORIN) 5 MG tablet Take 15 mg by mouth every Saturday.     methotrexate  50 MG/2ML injection Inject 1 mL (25 mg total) into the muscle every Saturday. Hold methotrexate  for 2 more weeks and then resume 3/10     Multiple Vitamin (MULTIVITAMIN WITH MINERALS) TABS tablet Take 1 tablet by mouth daily.     nitroGLYCERIN  (NITROSTAT ) 0.4 MG SL tablet Place 1 tablet (0.4 mg total) under the tongue every 5 (five) minutes as needed. 25 tablet 2   omeprazole  (PRILOSEC) 40 MG capsule Take 1 capsule (40 mg total) by mouth  daily. 90 capsule 3   Polyethyl Glycol-Propyl Glycol (LUBRICANT EYE DROPS) 0.4-0.3 % SOLN Place 1 drop into both eyes 3 (three) times daily as needed (dry/irritated eyes.).     predniSONE  (DELTASONE ) 5 MG tablet Take 10 mg by mouth in the morning.     rosuvastatin  (CRESTOR ) 40 MG tablet Take 1 tablet (40 mg total) by mouth daily. 90 tablet 1   sacubitril-valsartan (ENTRESTO ) 24-26 MG Take 1 tablet by mouth 2 (two) times daily. 180 tablet 3   spironolactone  (ALDACTONE ) 25 MG tablet Take 0.5 tablets (12.5 mg total) by mouth daily. Take additional Tablet 12.5 mg for swelling 135 tablet 2   traMADol  (ULTRAM ) 50 MG tablet Take 50 mg by mouth every 6 (six) hours as needed (pain.).     No current  facility-administered medications for this visit.    SURGICAL HISTORY:  Past Surgical History:  Procedure Laterality Date   BREAST BIOPSY Left    ?when//benign   CORONARY ANGIOPLASTY     CORONARY ATHERECTOMY N/A 07/17/2021   Procedure: CORONARY ATHERECTOMY;  Surgeon: Jordan, Peter M, MD;  Location: [facility] INVASIVE CV LAB;  Service: Cardiovascular;  Laterality: N/A;   RIGHT/LEFT HEART CATH AND CORONARY ANGIOGRAPHY N/A 06/16/2021   Procedure: RIGHT/LEFT HEART CATH AND CORONARY ANGIOGRAPHY;  Surgeon: Claudene Victory ORN, MD;  Location: [facility] INVASIVE CV LAB;  Service: Cardiovascular;  Laterality: N/A;    REVIEW OF SYSTEMS:   Review of Systems  Constitutional: Negative for appetite change, chills, fatigue, fever and unexpected weight change.  HENT:   Negative for mouth sores, nosebleeds, sore throat and trouble swallowing.   Eyes: Negative for eye problems and icterus.  Respiratory: Negative for cough, hemoptysis, shortness of breath and wheezing.   Cardiovascular: Negative for chest pain and leg swelling.  Gastrointestinal: Negative for abdominal pain, constipation, diarrhea, nausea and vomiting.  Genitourinary: Negative for bladder incontinence, difficulty urinating, dysuria, frequency and hematuria.   Musculoskeletal: Negative for back pain, gait problem, neck pain and neck stiffness.  Skin: Negative for itching and rash.  Neurological: Negative for dizziness, extremity weakness, gait problem, headaches, light-headedness and seizures.  Hematological: Negative for adenopathy. Does not bruise/bleed easily.  Psychiatric/Behavioral: Negative for confusion, depression and sleep disturbance. The patient is not nervous/anxious.     PHYSICAL EXAMINATION:  There were no vitals taken for this visit.  ECOG PERFORMANCE STATUS: {CHL ONC ECOG [id number]  Physical Exam  Constitutional: Oriented to person, place, and time and well-developed, well-nourished, and in no distress. No distress.  HENT:   Head: Normocephalic and atraumatic.  Mouth/Throat: Oropharynx is clear and moist. No oropharyngeal exudate.  Eyes: Conjunctivae are normal. Right eye exhibits no discharge. Left eye exhibits no discharge. No scleral icterus.  Neck: Normal range of motion. Neck supple.  Cardiovascular: Normal rate, regular rhythm, normal heart sounds and intact distal pulses.   Pulmonary/Chest: Effort normal and breath sounds normal. No respiratory distress. No wheezes. No rales.  Abdominal: Soft. Bowel sounds are normal. Exhibits no distension and no mass. There is no tenderness.  Musculoskeletal: Normal range of motion. Exhibits no edema.  Lymphadenopathy:    No cervical adenopathy.  Neurological: Alert and oriented to person, place, and time. Exhibits normal muscle tone. Gait normal. Coordination normal.  Skin: Skin is warm and dry. No rash noted. Not diaphoretic. No erythema. No pallor.  Psychiatric: Mood, memory and judgment normal.  Vitals reviewed.  LABORATORY DATA: Lab Results  Component Value Date   WBC 6.8 02/18/2024   HGB 9.4 (L) 02/18/2024  HCT 29.7 (L) 02/18/2024   MCV 90.8 02/18/2024   PLT 252 02/18/2024      Chemistry      Component Value Date/Time   NA 138 02/18/2024 1441   NA 139 12/17/2023 1424   K 4.3 02/18/2024 1441   CL 108 02/18/2024 1441   CO2 24 02/18/2024 1441   BUN 18 02/18/2024 1441   BUN 19 12/17/2023 1424   CREATININE 0.78 02/18/2024 1441      Component Value Date/Time   CALCIUM  9.7 02/18/2024 1441   ALKPHOS 53 02/18/2024 1441   AST 29 02/18/2024 1441   ALT 21 02/18/2024 1441   BILITOT 0.3 02/18/2024 1441       RADIOGRAPHIC STUDIES:  No results found.   ASSESSMENT/PLAN:  This is a very pleasant 73 year old female referred to the clinic for iron deficiency anemia.   She has received Feraheme  on 02/23/2024 and 02/19/24.     Labs were reviewed.  Her hemoglobin is ***. Her iron studies are pending but she will get her iron infusions tomorrow.     We will see her back for labs and a follow-up visit in 2-3 months   She is advised to keep taking her oral iron supplement with vitamin C.  The patient was advised to call immediately if she has any concerning symptoms in the interval. The patient voices understanding of current disease status and treatment options and is in agreement with the current care plan. All questions were answered. The patient knows to call the clinic with any problems, questions or concerns. We can certainly see the patient much sooner if necessary  No orders of the defined types were placed in this encounter.    I spent {CHL ONC TIME VISIT - DTPQU:[id number]} counseling the patient face to face. The total time spent in the appointment was {CHL ONC TIME VISIT - DTPQU:[id number]}.  Bertie Mcconathy L Sie Formisano, PA-C 04/24/2024

## 2024-04-28 MED FILL — HUMIRA PEN CITRATE FREE 40 MG/0.4 ML: SUBCUTANEOUS | 84 days supply | Qty: 12 | Fill #3

## 2024-05-03 DIAGNOSIS — M059 Rheumatoid arthritis with rheumatoid factor, unspecified: Principal | ICD-10-CM

## 2024-05-03 MED ORDER — BD TUBERCULIN SYRINGE 1 ML 27 X 1/2"
3 refills | 350.00000 days
Start: 2024-05-03 — End: ?

## 2024-05-04 MED ORDER — BD TUBERCULIN SYRINGE 1 ML 27 X 1/2"
3 refills | 350.00000 days | Status: CP
Start: 2024-05-04 — End: ?
  Filled 2024-05-07: qty 12, 84d supply, fill #0

## 2024-05-04 NOTE — Telephone Encounter (Signed)
 Tuberculin syringe refill    Last ov: 04/19/2024  Next ov: 06/04/2024

## 2024-05-07 ENCOUNTER — Emergency Department (HOSPITAL_COMMUNITY)

## 2024-05-07 ENCOUNTER — Other Ambulatory Visit: Payer: Self-pay

## 2024-05-07 ENCOUNTER — Inpatient Hospital Stay (HOSPITAL_COMMUNITY): Admitting: Anesthesiology

## 2024-05-07 ENCOUNTER — Encounter (HOSPITAL_COMMUNITY): Payer: Self-pay

## 2024-05-07 ENCOUNTER — Inpatient Hospital Stay (HOSPITAL_COMMUNITY)
Admission: EM | Admit: 2024-05-07 | Discharge: 2024-05-12 | DRG: 522 | Disposition: A | Discharge status: Home Health | Attending: Internal Medicine | Admitting: Internal Medicine

## 2024-05-07 DIAGNOSIS — I255 Ischemic cardiomyopathy: Secondary | ICD-10-CM | POA: Diagnosis present

## 2024-05-07 DIAGNOSIS — I272 Pulmonary hypertension, unspecified: Secondary | ICD-10-CM | POA: Diagnosis present

## 2024-05-07 DIAGNOSIS — W010XXA Fall on same level from slipping, tripping and stumbling without subsequent striking against object, initial encounter: Secondary | ICD-10-CM | POA: Diagnosis present

## 2024-05-07 DIAGNOSIS — Z79620 Long term (current) use of immunosuppressive biologic: Secondary | ICD-10-CM

## 2024-05-07 DIAGNOSIS — E872 Acidosis, unspecified: Secondary | ICD-10-CM | POA: Diagnosis present

## 2024-05-07 DIAGNOSIS — D509 Iron deficiency anemia, unspecified: Secondary | ICD-10-CM | POA: Diagnosis present

## 2024-05-07 DIAGNOSIS — I110 Hypertensive heart disease with heart failure: Secondary | ICD-10-CM | POA: Diagnosis not present

## 2024-05-07 DIAGNOSIS — Z8249 Family history of ischemic heart disease and other diseases of the circulatory system: Secondary | ICD-10-CM | POA: Diagnosis not present

## 2024-05-07 DIAGNOSIS — Z880 Allergy status to penicillin: Secondary | ICD-10-CM | POA: Diagnosis not present

## 2024-05-07 DIAGNOSIS — I10 Essential (primary) hypertension: Secondary | ICD-10-CM | POA: Diagnosis present

## 2024-05-07 DIAGNOSIS — S72012A Unspecified intracapsular fracture of left femur, initial encounter for closed fracture: Principal | ICD-10-CM | POA: Diagnosis present

## 2024-05-07 DIAGNOSIS — G4700 Insomnia, unspecified: Secondary | ICD-10-CM | POA: Diagnosis present

## 2024-05-07 DIAGNOSIS — S72009A Fracture of unspecified part of neck of unspecified femur, initial encounter for closed fracture: Secondary | ICD-10-CM | POA: Diagnosis present

## 2024-05-07 DIAGNOSIS — J849 Interstitial pulmonary disease, unspecified: Secondary | ICD-10-CM | POA: Diagnosis present

## 2024-05-07 DIAGNOSIS — S72002A Fracture of unspecified part of neck of left femur, initial encounter for closed fracture: Principal | ICD-10-CM

## 2024-05-07 DIAGNOSIS — E119 Type 2 diabetes mellitus without complications: Secondary | ICD-10-CM | POA: Diagnosis present

## 2024-05-07 DIAGNOSIS — M069 Rheumatoid arthritis, unspecified: Secondary | ICD-10-CM | POA: Diagnosis present

## 2024-05-07 DIAGNOSIS — Z7952 Long term (current) use of systemic steroids: Secondary | ICD-10-CM

## 2024-05-07 DIAGNOSIS — I2510 Atherosclerotic heart disease of native coronary artery without angina pectoris: Secondary | ICD-10-CM | POA: Diagnosis present

## 2024-05-07 DIAGNOSIS — Z7982 Long term (current) use of aspirin: Secondary | ICD-10-CM | POA: Diagnosis not present

## 2024-05-07 DIAGNOSIS — I5020 Unspecified systolic (congestive) heart failure: Secondary | ICD-10-CM | POA: Diagnosis not present

## 2024-05-07 DIAGNOSIS — W19XXXA Unspecified fall, initial encounter: Secondary | ICD-10-CM

## 2024-05-07 DIAGNOSIS — Z7902 Long term (current) use of antithrombotics/antiplatelets: Secondary | ICD-10-CM

## 2024-05-07 DIAGNOSIS — Z79899 Other long term (current) drug therapy: Secondary | ICD-10-CM | POA: Diagnosis not present

## 2024-05-07 DIAGNOSIS — D62 Acute posthemorrhagic anemia: Secondary | ICD-10-CM | POA: Diagnosis not present

## 2024-05-07 LAB — CBC WITH DIFFERENTIAL/PLATELET
Abs Immature Granulocytes: 0.08 K/uL — ABNORMAL HIGH (ref 0.00–0.07)
Basophils Absolute: 0.1 K/uL (ref 0.0–0.1)
Basophils Relative: 0 %
Eosinophils Absolute: 0.1 K/uL (ref 0.0–0.5)
Eosinophils Relative: 1 %
HCT: 37 % (ref 36.0–46.0)
Hemoglobin: 12.2 g/dL (ref 12.0–15.0)
Immature Granulocytes: 1 %
Lymphocytes Relative: 19 %
Lymphs Abs: 2.7 K/uL (ref 0.7–4.0)
MCH: 32.9 pg (ref 26.0–34.0)
MCHC: 33 g/dL (ref 30.0–36.0)
MCV: 99.7 fL (ref 80.0–100.0)
Monocytes Absolute: 1.1 K/uL — ABNORMAL HIGH (ref 0.1–1.0)
Monocytes Relative: 7 %
Neutro Abs: 10.5 K/uL — ABNORMAL HIGH (ref 1.7–7.7)
Neutrophils Relative %: 72 %
Platelets: 229 K/uL (ref 150–400)
RBC: 3.71 MIL/uL — ABNORMAL LOW (ref 3.87–5.11)
RDW: 15.6 % — ABNORMAL HIGH (ref 11.5–15.5)
WBC: 14.5 K/uL — ABNORMAL HIGH (ref 4.0–10.5)
nRBC: 0 % (ref 0.0–0.2)

## 2024-05-07 LAB — PRO BRAIN NATRIURETIC PEPTIDE: Pro Brain Natriuretic Peptide: 365 pg/mL — ABNORMAL HIGH

## 2024-05-07 LAB — BASIC METABOLIC PANEL WITH GFR
Anion gap: 16 — ABNORMAL HIGH (ref 5–15)
BUN: 23 mg/dL (ref 8–23)
CO2: 17 mmol/L — ABNORMAL LOW (ref 22–32)
Calcium: 9 mg/dL (ref 8.9–10.3)
Chloride: 104 mmol/L (ref 98–111)
Creatinine, Ser: 0.73 mg/dL (ref 0.44–1.00)
GFR, Estimated: >60 mL/min
Glucose, Bld: 100 mg/dL — ABNORMAL HIGH (ref 70–99)
Potassium: 4.1 mmol/L (ref 3.5–5.1)
Sodium: 137 mmol/L (ref 135–145)

## 2024-05-07 LAB — CREATININE, SERUM
Creatinine, Ser: 0.73 mg/dL (ref 0.44–1.00)
GFR, Estimated: >60 mL/min

## 2024-05-07 LAB — GLUCOSE, CAPILLARY: Glucose-Capillary: 125 mg/dL — ABNORMAL HIGH (ref 70–99)

## 2024-05-07 LAB — SURGICAL PCR SCREEN
MRSA, PCR: NEGATIVE
Staphylococcus aureus: NEGATIVE

## 2024-05-07 MED ORDER — VITAMIN B-12 1000 MCG PO TABS
1000.0000 ug | ORAL_TABLET | Freq: Every day | ORAL | Status: DC
  Administered 2024-05-07 – 2024-05-12 (×5): 1000 ug via ORAL
  Filled 2024-05-07 (×5): qty 1

## 2024-05-07 MED ORDER — MORPHINE SULFATE (PF) 2 MG/ML IV SOLN
2.0000 mg | INTRAVENOUS | Status: DC | PRN
  Administered 2024-05-07: 2 mg via INTRAVENOUS
  Filled 2024-05-07: qty 1

## 2024-05-07 MED ORDER — POLYVINYL ALCOHOL 1.4 % OP SOLN
1.0000 [drp] | Freq: Three times a day (TID) | OPHTHALMIC | Status: DC | PRN
  Filled 2024-05-07: qty 15

## 2024-05-07 MED ORDER — FENTANYL CITRATE (PF) 50 MCG/ML IJ SOSY
50.0000 ug | PREFILLED_SYRINGE | Freq: Once | INTRAMUSCULAR | Status: AC
  Administered 2024-05-07: 50 ug via INTRAVENOUS
  Filled 2024-05-07: qty 1

## 2024-05-07 MED ORDER — BUPIVACAINE-EPINEPHRINE (PF) 0.5% -1:200000 IJ SOLN
INTRAMUSCULAR | Status: DC | PRN
  Administered 2024-05-07: 30 mL via PERINEURAL

## 2024-05-07 MED ORDER — NITROGLYCERIN 0.4 MG SL SUBL: 0.4000 mg | SUBLINGUAL_TABLET | SUBLINGUAL | Status: DC | PRN

## 2024-05-07 MED ORDER — FENTANYL CITRATE (PF) 100 MCG/2ML IJ SOLN
INTRAMUSCULAR | Status: AC
  Filled 2024-05-07: qty 2

## 2024-05-07 MED ORDER — IPRATROPIUM-ALBUTEROL 0.5-2.5 (3) MG/3ML IN SOLN: 3.0000 mL | Freq: Two times a day (BID) | RESPIRATORY_TRACT | Status: DC

## 2024-05-07 MED ORDER — ASPIRIN 81 MG PO TBEC
81.0000 mg | DELAYED_RELEASE_TABLET | Freq: Every day | ORAL | Status: DC
  Administered 2024-05-07: 81 mg via ORAL
  Filled 2024-05-07: qty 1

## 2024-05-07 MED ORDER — FOLIC ACID 1 MG PO TABS
1000.0000 ug | ORAL_TABLET | Freq: Every day | ORAL | Status: DC
  Administered 2024-05-07 – 2024-05-12 (×5): 1 mg via ORAL
  Filled 2024-05-07 (×5): qty 1

## 2024-05-07 MED ORDER — POLYETHYLENE GLYCOL 3350 17 G PO PACK: 17.0000 g | PACK | Freq: Every day | ORAL | Status: DC | PRN

## 2024-05-07 MED ORDER — HYDRALAZINE HCL 20 MG/ML IJ SOLN: 10.0000 mg | Freq: Four times a day (QID) | INTRAMUSCULAR | Status: DC | PRN

## 2024-05-07 MED ORDER — DIAZEPAM 2 MG PO TABS
2.0000 mg | ORAL_TABLET | Freq: Once | ORAL | Status: AC
  Administered 2024-05-07: 2 mg via ORAL
  Filled 2024-05-07: qty 1

## 2024-05-07 MED ORDER — PREDNISONE 20 MG PO TABS: 10.0000 mg | ORAL_TABLET | Freq: Every morning | ORAL | Status: DC

## 2024-05-07 MED ORDER — ADULT MULTIVITAMIN W/MINERALS CH
1.0000 | ORAL_TABLET | Freq: Every day | ORAL | Status: DC
  Administered 2024-05-07 – 2024-05-12 (×5): 1 via ORAL
  Filled 2024-05-07 (×5): qty 1

## 2024-05-07 MED ORDER — SENNA 8.6 MG PO TABS
1.0000 | ORAL_TABLET | Freq: Two times a day (BID) | ORAL | Status: DC
  Administered 2024-05-07 – 2024-05-12 (×8): 8.6 mg via ORAL
  Filled 2024-05-07 (×8): qty 1

## 2024-05-07 MED ORDER — HYDROCODONE-ACETAMINOPHEN 5-325 MG PO TABS
1.0000 | ORAL_TABLET | Freq: Four times a day (QID) | ORAL | Status: DC | PRN
  Administered 2024-05-07: 1 via ORAL
  Administered 2024-05-08: 2 via ORAL
  Filled 2024-05-07 (×3): qty 1

## 2024-05-07 MED ORDER — FENTANYL CITRATE (PF) 50 MCG/ML IJ SOSY
50.0000 ug | PREFILLED_SYRINGE | Freq: Once | INTRAMUSCULAR | Status: DC
  Administered 2024-05-07 (×2): 50 ug via INTRAVENOUS

## 2024-05-07 MED ORDER — DOCUSATE SODIUM 100 MG PO CAPS
100.0000 mg | ORAL_CAPSULE | Freq: Two times a day (BID) | ORAL | Status: DC
  Administered 2024-05-07 – 2024-05-12 (×8): 100 mg via ORAL
  Filled 2024-05-07 (×8): qty 1

## 2024-05-07 MED ORDER — ENOXAPARIN SODIUM 40 MG/0.4ML IJ SOSY: 40.0000 mg | PREFILLED_SYRINGE | INTRAMUSCULAR | Status: DC

## 2024-05-07 MED ORDER — IPRATROPIUM-ALBUTEROL 0.5-2.5 (3) MG/3ML IN SOLN: 3.0000 mL | Freq: Four times a day (QID) | RESPIRATORY_TRACT | Status: DC | PRN

## 2024-05-07 MED ORDER — PANTOPRAZOLE SODIUM 40 MG PO TBEC
40.0000 mg | DELAYED_RELEASE_TABLET | Freq: Every day | ORAL | Status: DC
  Administered 2024-05-07 – 2024-05-12 (×5): 40 mg via ORAL
  Filled 2024-05-07 (×5): qty 1

## 2024-05-07 MED ORDER — PREDNISONE 5 MG PO TABS
5.0000 mg | ORAL_TABLET | Freq: Every morning | ORAL | Status: DC
  Administered 2024-05-07 – 2024-05-12 (×5): 5 mg via ORAL
  Filled 2024-05-07 (×5): qty 1

## 2024-05-07 MED ORDER — MUPIROCIN 2 % EX OINT
1.0000 | TOPICAL_OINTMENT | Freq: Two times a day (BID) | CUTANEOUS | Status: DC
  Administered 2024-05-08 – 2024-05-12 (×7): 1 via NASAL
  Filled 2024-05-07 (×4): qty 22

## 2024-05-07 MED ORDER — MORPHINE SULFATE (PF) 2 MG/ML IV SOLN
0.5000 mg | INTRAVENOUS | Status: DC | PRN
  Administered 2024-05-08: 0.5 mg via INTRAVENOUS
  Filled 2024-05-07: qty 1

## 2024-05-07 MED ORDER — ROSUVASTATIN CALCIUM 20 MG PO TABS
40.0000 mg | ORAL_TABLET | Freq: Every day | ORAL | Status: DC
  Administered 2024-05-07 – 2024-05-12 (×5): 40 mg via ORAL
  Filled 2024-05-07 (×5): qty 2

## 2024-05-07 NOTE — Consult Note (Signed)
 Reason for Consult:Left hip fx Referring Physician: Rocky Romero Time called: 1540 Time at bedside: 1554   Veronica Romero is an 74 y.o. female.  HPI: Veronica Romero fell at home earlier today. She's unsure what caused the fall. She had immediate left hip pain. She got up with help and then was brought to the ED. Workup showed a left hip fx and orthopedic surgery was consulted. She lives at home with family and does not use any assistive devices to ambulate.  Past Medical History:  Diagnosis Date   Anemia    Blood transfusion without reported diagnosis    Cataract    CHF (congestive heart failure) (HCC)    Coronary artery disease    Diabetes mellitus without complication (HCC)    Hypertension    ILD (interstitial lung disease) (HCC)    Ischemic cardiomyopathy 06/12/2021   EF 25-30%   Osteoporosis    Pulmonary HTN (HCC)    Rheumatoid arthritis (HCC)     Past Surgical History:  Procedure Laterality Date   BREAST BIOPSY Left    ?when//benign   CORONARY ANGIOPLASTY     CORONARY ATHERECTOMY N/A 07/17/2021   Procedure: CORONARY ATHERECTOMY;  Surgeon: Jordan, Peter M, MD;  Location: [facility] INVASIVE CV LAB;  Service: Cardiovascular;  Laterality: N/A;   RIGHT/LEFT HEART CATH AND CORONARY ANGIOGRAPHY N/A 06/16/2021   Procedure: RIGHT/LEFT HEART CATH AND CORONARY ANGIOGRAPHY;  Surgeon: Claudene Victory ORN, MD;  Location: [facility] INVASIVE CV LAB;  Service: Cardiovascular;  Laterality: N/A;    Family History  Problem Relation Age of Onset   Healthy Mother    Heart disease Father    Brain cancer Father    Kidney disease Brother    Autoimmune disease Daughter    Colon cancer Neg Hx    Esophageal cancer Neg Hx    Stomach cancer Neg Hx    Rectal cancer Neg Hx     Social History:  reports that she has never smoked. She has never used smokeless tobacco. She reports current alcohol  use of about 2.0 standard drinks of alcohol  per week. She reports that she does not use drugs.  Allergies:  Allergies[1]  Medications: I have reviewed the patient's current medications.  Results for orders placed or performed during the hospital encounter of 05/07/24 (from the past 48 hours)  CBC with Differential     Status: Abnormal   Collection Time: 05/07/24  3:10 PM  Result Value Ref Range   WBC 14.5 (H) 4.0 - 10.5 K/uL   RBC 3.71 (L) 3.87 - 5.11 MIL/uL   Hemoglobin 12.2 12.0 - 15.0 g/dL   HCT 62.9 63.9 - 53.9 %   MCV 99.7 80.0 - 100.0 fL   MCH 32.9 26.0 - 34.0 pg   MCHC 33.0 30.0 - 36.0 g/dL   RDW 84.3 (H) 88.4 - 84.4 %   Platelets 229 150 - 400 K/uL   nRBC 0.0 0.0 - 0.2 %   Neutrophils Relative % 72 %   Neutro Abs 10.5 (H) 1.7 - 7.7 K/uL   Lymphocytes Relative 19 %   Lymphs Abs 2.7 0.7 - 4.0 K/uL   Monocytes Relative 7 %   Monocytes Absolute 1.1 (H) 0.1 - 1.0 K/uL   Eosinophils Relative 1 %   Eosinophils Absolute 0.1 0.0 - 0.5 K/uL   Basophils Relative 0 %   Basophils Absolute 0.1 0.0 - 0.1 K/uL   Immature Granulocytes 1 %   Abs Immature Granulocytes 0.08 (H) 0.00 - 0.07 K/uL  Comment: Performed at [facility], [address] [place], [place] [address]    DG Chest 2 View Result Date: 05/07/2024 CLINICAL DATA:  Status post fall. EXAM: CHEST - 2 VIEW COMPARISON:  January 07, 2024 FINDINGS: The cardiac silhouette is borderline in size and unchanged in appearance. Chronic appearing increased lung markings are seen bilaterally with mild atelectasis and/or infiltrate noted within the left lung base. No pleural effusion or pneumothorax is identified. There is a small to moderate sized hiatal hernia. No acute osseous abnormalities are identified. IMPRESSION: 1. Chronic appearing increased lung markings with mild left basilar atelectasis and/or infiltrate. 2. Small to moderate sized hiatal hernia. Electronically Signed   By: Suzen Dials M.D.   On: 05/07/2024 16:04   CT Cervical Spine Wo Contrast Result Date: 05/07/2024 CLINICAL DATA:  Fall with head/neck trauma. EXAM: CT  HEAD WITHOUT CONTRAST CT CERVICAL SPINE WITHOUT CONTRAST TECHNIQUE: Multidetector CT imaging of the head and cervical spine was performed following the standard protocol without intravenous contrast. Multiplanar CT image reconstructions of the cervical spine were also generated. RADIATION DOSE REDUCTION: This exam was performed according to the departmental dose-optimization program which includes automated exposure control, adjustment of the mA and/or kV according to patient size and/or use of iterative reconstruction technique. COMPARISON:  None Available. FINDINGS: CT HEAD FINDINGS Brain: Ventricles, cisterns and other CSF spaces are normal. There is no mass, mass effect, shift of midline structures or acute hemorrhage. Minimal chronic ischemic microvascular disease is present. No acute infarction. Vascular: No hyperdense vessel or unexpected calcification. Skull: Normal. Negative for fracture or focal lesion. Sinuses/Orbits: Orbits are normal. Paranasal sinuses are well developed. There is opacification over the right maxillary sinus. Other: None. CT CERVICAL SPINE FINDINGS Alignment: Normal. Skull base and vertebrae: Mild spondylosis throughout the cervical spine to include uncovertebral joint spurring and facet arthropathy. Vertebral body heights are maintained. No acute fracture. Right-sided neural foraminal narrowing at the C3-4 level. Minimal left-sided neural from narrowing at the C4-5 level. Mild right-sided neural foraminal narrowing at the C5-6 level. Soft tissues and spinal canal: Prevertebral soft tissues are normal. No canal stenosis. Disc levels:  Minimal disc space narrowing at the C5-6 level. Upper chest: No acute findings. Other: None. IMPRESSION: 1. No acute brain injury. 2. Minimal chronic ischemic microvascular disease. 3. No acute cervical spine injury. 4. Mild spondylosis of the cervical spine with minimal disc disease at the C5-6 level. 5. Right maxillary sinus inflammatory disease.  Electronically Signed   By: Toribio Agreste M.D.   On: 05/07/2024 15:57   DG Hip Unilat W or Wo Pelvis 2-3 Views Left Result Date: 05/07/2024 EXAM: 2 or more VIEW(S) XRAY OF THE UNILATERAL HIP 05/07/2024 02:48:00 PM COMPARISON: None available. CLINICAL HISTORY: fall pain FINDINGS: BONES AND JOINTS: Subcapital left femoral neck fracture with varus angulation and shortening of the femoral neck. SOFT TISSUES: Vascular calcifications in pelvis. IMPRESSION: 1. Subcapital left femoral neck fracture with varus angulation and shortening of the femoral neck. Electronically signed by: Norleen Boxer MD MD 05/07/2024 03:32 PM EST RP Workstation: [id number]    Review of Systems  HENT:  Negative for ear discharge, ear pain, hearing loss and tinnitus.   Eyes:  Negative for photophobia and pain.  Respiratory:  Negative for cough and shortness of breath.   Cardiovascular:  Negative for chest pain.  Gastrointestinal:  Negative for abdominal pain, nausea and vomiting.  Genitourinary:  Negative for dysuria, flank pain, frequency and urgency.  Musculoskeletal:  Positive for arthralgias (Left  hip). Negative for back pain, myalgias and neck pain.  Neurological:  Negative for dizziness and headaches.  Hematological:  Does not bruise/bleed easily.  Psychiatric/Behavioral:  The patient is not nervous/anxious.    Blood pressure (!) 152/56, pulse 86, temperature (!) 97.5 F (36.4 C), temperature source Oral, resp. rate 16, height 5' 7 (1.702 m), weight 72.6 kg, SpO2 97%. Physical Exam Constitutional:      General: She is not in acute distress.    Appearance: She is well-developed. She is not diaphoretic.  HENT:     Head: Normocephalic and atraumatic.  Eyes:     General: No scleral icterus.       Right eye: No discharge.        Left eye: No discharge.     Conjunctiva/sclera: Conjunctivae normal.  Cardiovascular:     Rate and Rhythm: Normal rate and regular rhythm.  Pulmonary:     Effort: Pulmonary effort is  normal. No respiratory distress.  Musculoskeletal:     Cervical back: Normal range of motion.     Comments: LLE No traumatic wounds, ecchymosis, or rash  Mod hip TTP  No knee or ankle effusion  Knee stable to varus/ valgus and anterior/posterior stress  Sens DPN, SPN, TN intact  Motor EHL, ext, flex, evers 5/5  DP 2+, PT 2+, No significant edema  Skin:    General: Skin is warm and dry.  Neurological:     Mental Status: She is alert.  Psychiatric:        Mood and Affect: Mood normal.        Behavior: Behavior normal.     Assessment/Plan: Left hip fx -- Plan THA tomorrow with Dr. Kendal as long as cleared by medicine. Please keep NPO after MN. Multiple medical problems including RA and CAD -- per primary service    Ozell DOROTHA Ned, PA-C Orthopedic Surgery [phone] 05/07/2024, 4:16 PM     [1]  Allergies Allergen Reactions   Penicillins Rash

## 2024-05-07 NOTE — Anesthesia Preprocedure Evaluation (Addendum)
"                                    Anesthesia Evaluation  Patient identified by MRN, date of birth, ID band Patient awake    Reviewed: Allergy & Precautions, NPO status , Patient's Chart, lab work & pertinent test results, reviewed documented beta blocker date and time   History of Anesthesia Complications Negative for: history of anesthetic complications  Airway Mallampati: I  TM Distance: >3 FB Neck ROM: Full    Dental  (+) Missing, Dental Advisory Given   Pulmonary neg pulmonary ROS   breath sounds clear to auscultation       Cardiovascular hypertension, Pt. on medications and Pt. on home beta blockers (-) angina + CAD, + Past MI, + Cardiac Stents (LAD) and +CHF (entresto )   Rhythm:Regular Rate:Normal  05/2023 ECHO: EF 40-45%, mildly decreased LVF, normal RVF, mild MR, mod AI   Neuro/Psych negative neurological ROS     GI/Hepatic Neg liver ROS,GERD  Medicated and Controlled,,  Endo/Other  diabetes (glu 103)    Renal/GU negative Renal ROS     Musculoskeletal  (+) Arthritis , Rheumatoid disorders and steroids,    Abdominal   Peds  Hematology Plavix : lat 05/06/2024 Hb 11.6, plt 207k   Anesthesia Other Findings   Reproductive/Obstetrics                              Anesthesia Physical Anesthesia Plan  ASA: 3  Anesthesia Plan: General   Post-op Pain Management: Tylenol  PO (pre-op)*   Induction: Intravenous  PONV Risk Score and Plan: 3 and Ondansetron , Dexamethasone and Treatment may vary due to age or medical condition  Airway Management Planned: Oral ETT  Additional Equipment: None  Intra-op Plan:   Post-operative Plan: Extubation in OR  Informed Consent: I have reviewed the patients History and Physical, chart, labs and discussed the procedure including the risks, benefits and alternatives for the proposed anesthesia with the patient or authorized representative who has indicated his/her understanding and  acceptance.     Dental advisory given  Plan Discussed with: CRNA and Surgeon  Anesthesia Plan Comments: (Pt on plavix : GETA Stress steroid)         Anesthesia Quick Evaluation  "

## 2024-05-07 NOTE — ED Triage Notes (Signed)
 Pt hit head and has hematoma to left eyebrow, no LOC, no blood thinners.

## 2024-05-07 NOTE — Anesthesia Preprocedure Evaluation (Signed)
"                                    Anesthesia Evaluation  Patient identified by MRN, date of birth, ID band Patient awake    Reviewed: Allergy & Precautions, NPO status , Patient's Chart, lab work & pertinent test results, reviewed documented beta blocker date and time   Airway Mallampati: II  TM Distance: >3 FB     Dental no notable dental hx.    Pulmonary shortness of breath   breath sounds clear to auscultation       Cardiovascular hypertension, + CAD, + Past MI and +CHF   Rhythm:Regular Rate:Normal     Neuro/Psych  Neuromuscular disease    GI/Hepatic hiatal hernia,,,  Endo/Other  diabetes    Renal/GU      Musculoskeletal  (+) Arthritis ,    Abdominal   Peds  Hematology  (+) Blood dyscrasia, anemia   Anesthesia Other Findings   Reproductive/Obstetrics                              Anesthesia Physical Anesthesia Plan  ASA: 2  Anesthesia Plan: Regional   Post-op Pain Management: Regional block*   Induction:   PONV Risk Score and Plan:   Airway Management Planned:   Additional Equipment:   Intra-op Plan:   Post-operative Plan:   Informed Consent: I have reviewed the patients History and Physical, chart, labs and discussed the procedure including the risks, benefits and alternatives for the proposed anesthesia with the patient or authorized representative who has indicated his/her understanding and acceptance.     Consent reviewed with POA  Plan Discussed with:   Anesthesia Plan Comments:         Anesthesia Quick Evaluation  "

## 2024-05-07 NOTE — ED Provider Notes (Signed)
" °  Physical Exam  BP (!) 152/56 (BP Location: Right Arm)   Pulse 86   Temp (!) 97.5 F (36.4 C) (Oral)   Resp 16   Ht 5' 7 (1.702 m)   Wt 72.6 kg   SpO2 97%   BMI 25.06 kg/m   Physical Exam  Procedures  Procedures  ED Course / MDM    Medical Decision Making Amount and/or Complexity of Data Reviewed Labs: ordered. Radiology: ordered.  Risk Prescription drug management. Decision regarding hospitalization.   Received care of patient from Dr. Rogelia.  Please see her note for prior history, physical and care.  Briefly this is a 74 year old female with a history of RA, anemia, hypertension, CAD, hyperlipidemia, CHF presents with mechanical fall and hip pain.  NV intact.  X-ray shows left femoral neck fracture.  She is awaiting other imaging and lab at this time   Spoke with Ozell Purchase, PA-C and have orthopedic surgery.  He anticipates that they will do surgery tomorrow, n.p.o. at midnight.  Labs completed and personally eval and interpreted by me show mild leukocytosis which is nonspecific, no anemia.  Electrolytes are without clinically significant findings.  Chest x-ray with chronic appearing lung markings.  CT head and cervical spine completed showed no evidence of intracranial hemorrhage or acute cervical spine injury.  Admitted to the hospital for further care of her left hip fracture.     Dreama Longs, MD 05/08/24 1203  "

## 2024-05-07 NOTE — ED Provider Triage Note (Signed)
 Emergency Medicine Provider Triage Evaluation Note  Veronica Romero , a 74 y.o. female  was evaluated in triage.  Pt complains of left hip pain. Earlier today she tripped landing on her left hip and hittin gher head. Denies blood thinner. No LOC. Reports left hip pain. Denies any chest, belly, or back pain. .  Review of Systems  Positive:  Negative:   Physical Exam  BP (!) 152/56 (BP Location: Right Arm)   Pulse 86   Temp (!) 97.5 F (36.4 C) (Oral)   Resp 16   Ht 5' 7 (1.702 m)   Wt 72.6 kg   SpO2 97%   BMI 25.06 kg/m  Gen:   Awake, no distress   Resp:  Normal effort  MSK:   Moves extremities without difficulty  Other:  Palpable distal pulses. Compartments soft. Feet warm. Tenderness to the left lateral hip. Chest and abdomen. Cranial nerves grossly intact.   Medical Decision Making  Medically screening exam initiated at 1:50 PM.  Appropriate orders placed.  Mariesha L Buhman was informed that the remainder of the evaluation will be completed by another provider, this initial triage assessment does not replace that evaluation, and the importance of remaining in the ED until their evaluation is complete. Imaging ordered.    Bernis Ernst, PA-C 05/07/24 1404

## 2024-05-07 NOTE — H&P (Addendum)
 " History and Physical    Patient: Veronica Romero FMW:[id number] DOB: 05-01-1950 DOA: 05/07/2024 DOS: the patient was seen and examined on 05/07/2024 PCP: Melvin Pao, NP  Patient coming from: Home  Chief Complaint:  Chief Complaint  Patient presents with   Fall   Hip Pain   HPI: Veronica Romero is a 74 y.o. female with medical history significant of ischemic cardiomyopathy last ejection fraction 45 to 50% 05/07/2023, diabetes, ILD, hypertension, CAD, pulmonary hypertension, rheumatoid arthritis, who presented after a mechanical fall.  Patient patient reports she was walking on the side walk when she fell down, she thought that maybe she had tripped over her feet.  She denies chest pain, shortness of breath, lightheadedness or dizziness prior to fall.  She has been doing well.  She denies cough, fever, no nausea,  vomiting or diarrhea.  Patient reports that she is able to ambulate several blocks without getting shortness of breath.  She also denies chest pain on ambulation.  No lower extremity edema, no worsening shortness of breath.  She has been doing better since her blood pressure medication were discontinued.   Evaluation in the ED: Hip x-ray: Subcapital left femoral neck fracture with varus angulation and shortening of the femoral neck Chest x-ray: Chronic appearing increased lung markings with mild left basilar atelectasis or infiltrate CT cervical spine: No acute cervical spine injury.  Mild spondylosis of the cervical spine with minimal disc disease at the C5-C6 level CT head: No acute brain injury minimal chronic ischemic microvascular disease Labs: Sodium 137, potassium 4.1, bicarb 17, glucose 100 16, hemoglobin 12, white blood cell 14, platelets 229.    Review of Systems: As mentioned in the history of present illness. All other systems reviewed and are negative. Past Medical History:  Diagnosis Date   Anemia    Blood transfusion without reported diagnosis    Cataract     CHF (congestive heart failure) (HCC)    Coronary artery disease    Diabetes mellitus without complication (HCC)    Hypertension    ILD (interstitial lung disease) (HCC)    Ischemic cardiomyopathy 06/12/2021   EF 25-30%   Osteoporosis    Pulmonary HTN (HCC)    Rheumatoid arthritis (HCC)    Past Surgical History:  Procedure Laterality Date   BREAST BIOPSY Left    ?when//benign   CORONARY ANGIOPLASTY     CORONARY ATHERECTOMY N/A 07/17/2021   Procedure: CORONARY ATHERECTOMY;  Surgeon: Jordan, Peter M, MD;  Location: [facility] INVASIVE CV LAB;  Service: Cardiovascular;  Laterality: N/A;   RIGHT/LEFT HEART CATH AND CORONARY ANGIOGRAPHY N/A 06/16/2021   Procedure: RIGHT/LEFT HEART CATH AND CORONARY ANGIOGRAPHY;  Surgeon: Claudene Victory ORN, MD;  Location: [facility] INVASIVE CV LAB;  Service: Cardiovascular;  Laterality: N/A;   Social History:  reports that she has never smoked. She has never used smokeless tobacco. She reports current alcohol  use of about 2.0 standard drinks of alcohol  per week. She reports that she does not use drugs.  Allergies[1]  Family History  Problem Relation Age of Onset   Healthy Mother    Heart disease Father    Brain cancer Father    Kidney disease Brother    Autoimmune disease Daughter    Colon cancer Neg Hx    Esophageal cancer Neg Hx    Stomach cancer Neg Hx    Rectal cancer Neg Hx     Prior to Admission medications  Medication Sig Start Date End Date Taking? Authorizing Provider  Adalimumab (  HUMIRA PEN) 40 MG/0.4ML PNKT Inject 0.4 mLs into the skin every Tuesday. Tuesday 04/10/21  Yes [provider]  aspirin  EC 81 MG tablet Take 1 tablet (81 mg total) by mouth daily. Swallow whole. 07/18/21  Yes Henry Shaver B, NP  Cholecalciferol  (VITAMIN D3 PO) Take 1 tablet by mouth daily.   Yes [provider]  clopidogrel  (PLAVIX ) 75 MG tablet Take 1 tablet (75 mg total) by mouth daily. 12/23/23  Yes Tobb, Kardie, DO  cyanocobalamin  (VITAMIN B12) 1000  MCG tablet Take 1,000 mcg by mouth daily.   Yes [provider]  diclofenac Sodium (VOLTAREN) 1 % GEL Apply 2 g topically. OTC Patient taking differently: Apply 2 g topically as needed. OTC 04/01/20  Yes [provider]  folic acid  (FOLVITE ) 800 MCG tablet Take 800 mcg by mouth daily.   Yes [provider]  leucovorin (WELLCOVORIN) 5 MG tablet Take 15 mg by mouth every Saturday. 04/07/21  Yes [provider]  methotrexate  50 MG/2ML injection Inject 1 mL (25 mg total) into the muscle every Saturday. Hold methotrexate  for 2 more weeks and then resume 3/10 07/22/21  Yes Henry Shaver NOVAK, NP  Multiple Vitamin (MULTIVITAMIN WITH MINERALS) TABS tablet Take 1 tablet by mouth daily.   Yes [provider]  nitroGLYCERIN  (NITROSTAT ) 0.4 MG SL tablet Place 1 tablet (0.4 mg total) under the tongue every 5 (five) minutes as needed. 10/01/22  Yes Duke, Jon Garre, PA  omeprazole  (PRILOSEC) 40 MG capsule Take 1 capsule (40 mg total) by mouth daily. 01/29/24  Yes Craig Palma R, PA-C  Polyethyl Glycol-Propyl Glycol (LUBRICANT EYE DROPS) 0.4-0.3 % SOLN Place 1 drop into both eyes 3 (three) times daily as needed (dry/irritated eyes.).   Yes [provider]  predniSONE  (DELTASONE ) 5 MG tablet Take 10 mg by mouth in the morning. 04/12/21  Yes [provider]  rosuvastatin  (CRESTOR ) 40 MG tablet Take 1 tablet (40 mg total) by mouth daily. 12/16/23  Yes Tobb, Kardie, DO  traMADol  (ULTRAM ) 50 MG tablet Take 50 mg by mouth every 6 (six) hours as needed (pain.).   Yes [provider]  carvedilol  (COREG ) 3.125 MG tablet Take 1 tablet (3.125 mg total) by mouth 2 (two) times daily with a meal. Patient not taking: Reported on 01/20/2024 09/20/23   Tobb, Kardie, DO  sacubitril-valsartan (ENTRESTO ) 24-26 MG Take 1 tablet by mouth 2 (two) times daily. Patient not taking: Reported on 05/07/2024 03/01/22   Donette Ellouise LABOR, FNP  spironolactone  (ALDACTONE ) 25 MG  tablet Take 0.5 tablets (12.5 mg total) by mouth daily. Take additional Tablet 12.5 mg for swelling Patient not taking: Reported on 05/07/2024 10/01/22   Madie Jon Garre, PA    Physical Exam: Vitals:   05/07/24 1322 05/07/24 1350  BP: (!) 152/56   Pulse: 86   Resp: 16   Temp: (!) 97.5 F (36.4 C)   TempSrc: Oral   SpO2: 97%   Weight:  72.6 kg  Height:  5' 7 (1.702 m)   General; no acute distress, appears in pain CVS: S1-S2 regular rhythm and rate Lung: Normal respiratory effort, clear to auscultation Abdomen; bowel sounds present, soft nontender nondistended Extremities; no edema, left lower extremity shorter than right Neuro; alert conversant following commands   Data Reviewed:  Labs reviewed.   Assessment and Plan: No notes have been filed under this hospital service. Service: Hospitalist  Subcapital left femoral neck fracture; - Patient presented after a mechanical fall found to have femoral neck  fracture.  Orthopedic has been consulted, plan for surgical intervention tomorrow - She does have multiple risk factors and chronic medical condition but they are all stable and well-controlled - Will check EKG and proBNP. - Charlanne perioperative risk 0.7%  Interstitial lung disease -Chest x-ray: Chronic appearing increased lung markings with mild left basilar atelectasis and/or infiltrate. - Not on home oxygen - No appear to be on any inhaler at home -Will order as needed nebulizers  History of ischemic cardiomyopathy with improved ejection fraction - History of OCT guided complex atherectomy to ostial mid LAD with DES 3.0 x 30 mm in 06/2021.  She has residual 50% stenosis in the mid RCA and distal LCx. - Patient denies chest pain, she is able to ambulate several blocks without getting shortness of breath or chest pain. - Check EKG and proBNP - Will hold Plavix  prior to surgery resume as possible postsurgery. - Will continue aspirin  if is okay with orthopedic  Diabetes  type 2 - Not on medication.  Monitor CBG  Metabolic acidosis; Mild , monitor. Repeat labs in am.   Rheumatoid arthritis - On Humira and low-dose prednisone  -Continue chronic dose of prednisone  5 mg daily  Iron deficiency anemia: Continue folic acid  and B12 supplements.  Monitor hemoglobin  Leukocytosis: Suspect demargination.  Stress.  Monitor   Advance Care Planning:   Code Status: Prior   Consults: Orthopedic.   Family Communication: Family who was at bedside  Severity of Illness: The appropriate patient status for this patient is INPATIENT. Inpatient status is judged to be reasonable and necessary in order to provide the required intensity of service to ensure the patient's safety. The patient's presenting symptoms, physical exam findings, and initial radiographic and laboratory data in the context of their chronic comorbidities is felt to place them at high risk for further clinical deterioration. Furthermore, it is not anticipated that the patient will be medically stable for discharge from the hospital within 2 midnights of admission.   * I certify that at the point of admission it is my clinical judgment that the patient will require inpatient hospital care spanning beyond 2 midnights from the point of admission due to high intensity of service, high risk for further deterioration and high frequency of surveillance required.*  Author: Shonnie Poudrier A Islam Eichinger, MD 05/07/2024 4:37 PM  For on call review [email or web address].      [1]  Allergies Allergen Reactions   Penicillins Rash   "

## 2024-05-07 NOTE — Anesthesia Procedure Notes (Signed)
 Anesthesia Regional Block: Femoral nerve block   Pre-Anesthetic Checklist: , timeout performed,  Correct Patient, Correct Site, Correct Laterality,  Correct Procedure, Correct Position, site marked,  Risks and benefits discussed,  Surgical consent,  Pre-op evaluation,  At surgeon's request and post-op pain management  Laterality: Left  Prep: Maximum Sterile Barrier Precautions used, chloraprep       Needles:  Injection technique: Single-shot  Needle Type: Echogenic Needle      Needle Gauge: 20     Additional Needles:   Procedures:,,,, ultrasound used (permanent image in chart),,    Narrative:  Start time: 05/07/2024 5:45 PM End time: 05/07/2024 5:50 PM Injection made incrementally with aspirations every 5 mL.  Performed by: Personally  Anesthesiologist: Keneth Lynwood POUR, MD

## 2024-05-07 NOTE — ED Triage Notes (Signed)
 Pt reports tripping and falling at home, injuring her L hip. Pt unable to bear weight since then. Concerned for possible hip fx. No blood thinners, head injury, LOC.

## 2024-05-07 NOTE — Progress Notes (Signed)
 100 fentanyl  given in pacu for nerve block, V.O by Dr Davied

## 2024-05-07 NOTE — Anesthesia Postprocedure Evaluation (Signed)
"   Anesthesia Post Note  Patient: Veronica Romero  Procedure(s) Performed: AN AD HOC NERVE BLOCK     Patient location during evaluation: PACU Anesthesia Type: Regional Level of consciousness: awake and alert Pain management: pain level controlled Vital Signs Assessment: post-procedure vital signs reviewed and stable Respiratory status: spontaneous breathing, nonlabored ventilation, respiratory function stable and patient connected to nasal cannula oxygen Cardiovascular status: blood pressure returned to baseline and stable Postop Assessment: no apparent nausea or vomiting Anesthetic complications: no   No notable events documented.  Last Vitals:  Vitals:   05/07/24 1740 05/07/24 1745  BP: (!) 189/162 (!) 166/54  Pulse:    Resp:    Temp:    SpO2:      Last Pain:  Vitals:   05/07/24 1624  TempSrc:   PainSc: 7                  Veronica Romero      "

## 2024-05-07 NOTE — H&P (View-Only) (Signed)
 Reason for Consult:Left hip fx Referring Physician: Rocky Massy Time called: 1540 Time at bedside: 1554   Veronica Romero is an 74 y.o. female.  HPI: Veronica Romero fell at home earlier today. She's unsure what caused the fall. She had immediate left hip pain. She got up with help and then was brought to the ED. Workup showed a left hip fx and orthopedic surgery was consulted. She lives at home with family and does not use any assistive devices to ambulate.  Past Medical History:  Diagnosis Date   Anemia    Blood transfusion without reported diagnosis    Cataract    CHF (congestive heart failure) (HCC)    Coronary artery disease    Diabetes mellitus without complication (HCC)    Hypertension    ILD (interstitial lung disease) (HCC)    Ischemic cardiomyopathy 06/12/2021   EF 25-30%   Osteoporosis    Pulmonary HTN (HCC)    Rheumatoid arthritis (HCC)     Past Surgical History:  Procedure Laterality Date   BREAST BIOPSY Left    ?when//benign   CORONARY ANGIOPLASTY     CORONARY ATHERECTOMY N/A 07/17/2021   Procedure: CORONARY ATHERECTOMY;  Surgeon: Jordan, Peter M, MD;  Location: [facility] INVASIVE CV LAB;  Service: Cardiovascular;  Laterality: N/A;   RIGHT/LEFT HEART CATH AND CORONARY ANGIOGRAPHY N/A 06/16/2021   Procedure: RIGHT/LEFT HEART CATH AND CORONARY ANGIOGRAPHY;  Surgeon: Claudene Victory ORN, MD;  Location: [facility] INVASIVE CV LAB;  Service: Cardiovascular;  Laterality: N/A;    Family History  Problem Relation Age of Onset   Healthy Mother    Heart disease Father    Brain cancer Father    Kidney disease Brother    Autoimmune disease Daughter    Colon cancer Neg Hx    Esophageal cancer Neg Hx    Stomach cancer Neg Hx    Rectal cancer Neg Hx     Social History:  reports that she has never smoked. She has never used smokeless tobacco. She reports current alcohol  use of about 2.0 standard drinks of alcohol  per week. She reports that she does not use drugs.  Allergies:  Allergies[1]  Medications: I have reviewed the patient's current medications.  Results for orders placed or performed during the hospital encounter of 05/07/24 (from the past 48 hours)  CBC with Differential     Status: Abnormal   Collection Time: 05/07/24  3:10 PM  Result Value Ref Range   WBC 14.5 (H) 4.0 - 10.5 K/uL   RBC 3.71 (L) 3.87 - 5.11 MIL/uL   Hemoglobin 12.2 12.0 - 15.0 g/dL   HCT 62.9 63.9 - 53.9 %   MCV 99.7 80.0 - 100.0 fL   MCH 32.9 26.0 - 34.0 pg   MCHC 33.0 30.0 - 36.0 g/dL   RDW 84.3 (H) 88.4 - 84.4 %   Platelets 229 150 - 400 K/uL   nRBC 0.0 0.0 - 0.2 %   Neutrophils Relative % 72 %   Neutro Abs 10.5 (H) 1.7 - 7.7 K/uL   Lymphocytes Relative 19 %   Lymphs Abs 2.7 0.7 - 4.0 K/uL   Monocytes Relative 7 %   Monocytes Absolute 1.1 (H) 0.1 - 1.0 K/uL   Eosinophils Relative 1 %   Eosinophils Absolute 0.1 0.0 - 0.5 K/uL   Basophils Relative 0 %   Basophils Absolute 0.1 0.0 - 0.1 K/uL   Immature Granulocytes 1 %   Abs Immature Granulocytes 0.08 (H) 0.00 - 0.07 K/uL  Comment: Performed at [facility], [address] [place], [place] [address]    DG Chest 2 View Result Date: 05/07/2024 CLINICAL DATA:  Status post fall. EXAM: CHEST - 2 VIEW COMPARISON:  January 07, 2024 FINDINGS: The cardiac silhouette is borderline in size and unchanged in appearance. Chronic appearing increased lung markings are seen bilaterally with mild atelectasis and/or infiltrate noted within the left lung base. No pleural effusion or pneumothorax is identified. There is a small to moderate sized hiatal hernia. No acute osseous abnormalities are identified. IMPRESSION: 1. Chronic appearing increased lung markings with mild left basilar atelectasis and/or infiltrate. 2. Small to moderate sized hiatal hernia. Electronically Signed   By: Suzen Dials M.D.   On: 05/07/2024 16:04   CT Cervical Spine Wo Contrast Result Date: 05/07/2024 CLINICAL DATA:  Fall with head/neck trauma. EXAM: CT  HEAD WITHOUT CONTRAST CT CERVICAL SPINE WITHOUT CONTRAST TECHNIQUE: Multidetector CT imaging of the head and cervical spine was performed following the standard protocol without intravenous contrast. Multiplanar CT image reconstructions of the cervical spine were also generated. RADIATION DOSE REDUCTION: This exam was performed according to the departmental dose-optimization program which includes automated exposure control, adjustment of the mA and/or kV according to patient size and/or use of iterative reconstruction technique. COMPARISON:  None Available. FINDINGS: CT HEAD FINDINGS Brain: Ventricles, cisterns and other CSF spaces are normal. There is no mass, mass effect, shift of midline structures or acute hemorrhage. Minimal chronic ischemic microvascular disease is present. No acute infarction. Vascular: No hyperdense vessel or unexpected calcification. Skull: Normal. Negative for fracture or focal lesion. Sinuses/Orbits: Orbits are normal. Paranasal sinuses are well developed. There is opacification over the right maxillary sinus. Other: None. CT CERVICAL SPINE FINDINGS Alignment: Normal. Skull base and vertebrae: Mild spondylosis throughout the cervical spine to include uncovertebral joint spurring and facet arthropathy. Vertebral body heights are maintained. No acute fracture. Right-sided neural foraminal narrowing at the C3-4 level. Minimal left-sided neural from narrowing at the C4-5 level. Mild right-sided neural foraminal narrowing at the C5-6 level. Soft tissues and spinal canal: Prevertebral soft tissues are normal. No canal stenosis. Disc levels:  Minimal disc space narrowing at the C5-6 level. Upper chest: No acute findings. Other: None. IMPRESSION: 1. No acute brain injury. 2. Minimal chronic ischemic microvascular disease. 3. No acute cervical spine injury. 4. Mild spondylosis of the cervical spine with minimal disc disease at the C5-6 level. 5. Right maxillary sinus inflammatory disease.  Electronically Signed   By: Toribio Agreste M.D.   On: 05/07/2024 15:57   DG Hip Unilat W or Wo Pelvis 2-3 Views Left Result Date: 05/07/2024 EXAM: 2 or more VIEW(S) XRAY OF THE UNILATERAL HIP 05/07/2024 02:48:00 PM COMPARISON: None available. CLINICAL HISTORY: fall pain FINDINGS: BONES AND JOINTS: Subcapital left femoral neck fracture with varus angulation and shortening of the femoral neck. SOFT TISSUES: Vascular calcifications in pelvis. IMPRESSION: 1. Subcapital left femoral neck fracture with varus angulation and shortening of the femoral neck. Electronically signed by: Norleen Boxer MD MD 05/07/2024 03:32 PM EST RP Workstation: [id number]    Review of Systems  HENT:  Negative for ear discharge, ear pain, hearing loss and tinnitus.   Eyes:  Negative for photophobia and pain.  Respiratory:  Negative for cough and shortness of breath.   Cardiovascular:  Negative for chest pain.  Gastrointestinal:  Negative for abdominal pain, nausea and vomiting.  Genitourinary:  Negative for dysuria, flank pain, frequency and urgency.  Musculoskeletal:  Positive for arthralgias (Left  hip). Negative for back pain, myalgias and neck pain.  Neurological:  Negative for dizziness and headaches.  Hematological:  Does not bruise/bleed easily.  Psychiatric/Behavioral:  The patient is not nervous/anxious.    Blood pressure (!) 152/56, pulse 86, temperature (!) 97.5 F (36.4 C), temperature source Oral, resp. rate 16, height 5' 7 (1.702 m), weight 72.6 kg, SpO2 97%. Physical Exam Constitutional:      General: She is not in acute distress.    Appearance: She is well-developed. She is not diaphoretic.  HENT:     Head: Normocephalic and atraumatic.  Eyes:     General: No scleral icterus.       Right eye: No discharge.        Left eye: No discharge.     Conjunctiva/sclera: Conjunctivae normal.  Cardiovascular:     Rate and Rhythm: Normal rate and regular rhythm.  Pulmonary:     Effort: Pulmonary effort is  normal. No respiratory distress.  Musculoskeletal:     Cervical back: Normal range of motion.     Comments: LLE No traumatic wounds, ecchymosis, or rash  Mod hip TTP  No knee or ankle effusion  Knee stable to varus/ valgus and anterior/posterior stress  Sens DPN, SPN, TN intact  Motor EHL, ext, flex, evers 5/5  DP 2+, PT 2+, No significant edema  Skin:    General: Skin is warm and dry.  Neurological:     Mental Status: She is alert.  Psychiatric:        Mood and Affect: Mood normal.        Behavior: Behavior normal.     Assessment/Plan: Left hip fx -- Plan THA tomorrow with Dr. Kendal as long as cleared by medicine. Please keep NPO after MN. Multiple medical problems including RA and CAD -- per primary service    Ozell DOROTHA Ned, PA-C Orthopedic Surgery [phone] 05/07/2024, 4:16 PM     [1]  Allergies Allergen Reactions   Penicillins Rash

## 2024-05-07 NOTE — ED Provider Notes (Signed)
 " Veronica Romero Provider Note   CSN: [id number] Arrival date & time: 05/07/24  1317     History Chief Complaint  Patient presents with   Fall   Hip Pain    HPI: Veronica Romero is a 74 y.o. female with history perinent for rheumatoid arthritis with chronic deformities of bilateral hands, ischemic cardiomyopathy, CHF, CAD, T2DM, HTN, ILD, pulmonary hypertension who presents complaining of fall and left hip pain. Patient arrived via POV accompanied by grandson and granddaughter.  History provided by patient.  No interpreter required during this encounter.  Patient reports that she was ambulating outside on the sidewalk just prior to arrival, reports that she did not have any prodromal symptoms such as lightheadedness, chest pain, shortness of breath, denies any recent symptoms such as fever, chills, nausea, vomiting, diarrhea.  Reports that she believes that she must have tripped over her own feet, she fell and landed on her left hip and had severe pain.  She was unable to get up on her own, therefore her grandson picked her up and her grandchildren brought her to the emergency department.  Does believe that she did hit her head, does not use anticoagulants, denies loss of consciousness.  Reports that she has severe pain in her left hip with accompanying spasms, denies sensation of pain in her head, neck, back, right lower extremity, bilateral upper extremities, though reports that the severe pain in her left hip is distracting.  Reports that she has chronic deformities of her fingers related to her underlying rheumatoid arthritis.  Prior to Admission medications  Medication Sig Start Date End Date Taking? Authorizing Provider  Adalimumab (HUMIRA PEN) 40 MG/0.4ML PNKT Inject 0.4 mLs into the skin every Tuesday. Tuesday 04/10/21   [provider]  aspirin  EC 81 MG tablet Take 1 tablet (81 mg total) by mouth daily. Swallow whole. 07/18/21   Henry Manuelita NOVAK, NP  carvedilol  (COREG ) 3.125 MG tablet Take 1 tablet (3.125 mg total) by mouth 2 (two) times daily with a meal. Patient not taking: Reported on 01/20/2024 09/20/23   Tobb, Kardie, DO  Cholecalciferol  (VITAMIN D3 PO) Take 1 tablet by mouth daily.    [provider]  clopidogrel  (PLAVIX ) 75 MG tablet Take 1 tablet (75 mg total) by mouth daily. 12/23/23   Tobb, Kardie, DO  cyanocobalamin  (VITAMIN B12) 1000 MCG tablet Take 1,000 mcg by mouth daily.    [provider]  diclofenac Sodium (VOLTAREN) 1 % GEL Apply 2 g topically. OTC Patient taking differently: Apply 2 g topically as needed. OTC 04/01/20   [provider]  folic acid  (FOLVITE ) 800 MCG tablet Take 800 mcg by mouth daily.    [provider]  leucovorin (WELLCOVORIN) 5 MG tablet Take 15 mg by mouth every Saturday. 04/07/21   [provider]  methotrexate  50 MG/2ML injection Inject 1 mL (25 mg total) into the muscle every Saturday. Hold methotrexate  for 2 more weeks and then resume 3/10 07/22/21   Henry Manuelita NOVAK, NP  Multiple Vitamin (MULTIVITAMIN WITH MINERALS) TABS tablet Take 1 tablet by mouth daily.    [provider]  nitroGLYCERIN  (NITROSTAT ) 0.4 MG SL tablet Place 1 tablet (0.4 mg total) under the tongue every 5 (five) minutes as needed. 10/01/22   Duke, Jon Garre, PA  omeprazole  (PRILOSEC) 40 MG capsule Take 1 capsule (40 mg total) by mouth daily. 01/29/24   Craig Alan SAUNDERS, PA-C  Polyethyl Glycol-Propyl Glycol (LUBRICANT EYE DROPS)  0.4-0.3 % SOLN Place 1 drop into both eyes 3 (three) times daily as needed (dry/irritated eyes.).    [provider]  predniSONE  (DELTASONE ) 5 MG tablet Take 10 mg by mouth in the morning. 04/12/21   [provider]  rosuvastatin  (CRESTOR ) 40 MG tablet Take 1 tablet (40 mg total) by mouth daily. 12/16/23   Tobb, Kardie, DO  sacubitril-valsartan (ENTRESTO ) 24-26 MG Take 1 tablet by mouth 2 (two) times daily. 03/01/22   Donette Ellouise LABOR, FNP  spironolactone  (ALDACTONE ) 25 MG tablet Take 0.5 tablets (12.5 mg total) by mouth daily. Take additional Tablet 12.5 mg for swelling 10/01/22   Duke, Jon Garre, PA  traMADol  (ULTRAM ) 50 MG tablet Take 50 mg by mouth every 6 (six) hours as needed (pain.).    [provider]     Allergies: Penicillins   Review of Systems   ROS as per HPI  Physical Exam Updated Vital Signs BP (!) 152/56 (BP Location: Right Arm)   Pulse 86   Temp (!) 97.5 F (36.4 C) (Oral)   Resp 16   Ht 5' 7 (1.702 m)   Wt 72.6 kg   SpO2 97%   BMI 25.06 kg/m  Physical Exam Vitals and nursing note reviewed.  Constitutional:      General: She is not in acute distress.    Appearance: She is well-developed.  HENT:     Head: Normocephalic and atraumatic.  Eyes:     Conjunctiva/sclera: Conjunctivae normal.  Cardiovascular:     Rate and Rhythm: Normal rate and regular rhythm.     Heart sounds: No murmur heard. Pulmonary:     Effort: Pulmonary effort is normal. No respiratory distress.     Breath sounds: Normal breath sounds.  Abdominal:     Palpations: Abdomen is soft.     Tenderness: There is no abdominal tenderness.  Musculoskeletal:        General: No swelling.     Cervical back: Neck supple. No tenderness or bony tenderness. No pain with movement.     Comments: Chest stable, nontender to AP and lateral compression, bilateral upper extremities appear atraumatic, no tenderness to palpation, chronic deformities of fingers.  Right lower extremity appears atraumatic, no tenderness to palpation.  Patient does have tenderness to palpation of left hip, mid thigh, knee, lower extremity, foot, ankle without gross deformity, nontender to palpation.  Skin:    General: Skin is warm and dry.     Capillary Refill: Capillary refill takes less than 2 seconds.  Neurological:     Mental Status: She is alert.  Psychiatric:        Mood and Affect: Mood normal.     ED Course/ Medical Decision  Making/ A&P    Procedures Procedures   Medications Ordered in ED Medications  fentaNYL  (SUBLIMAZE ) injection 50 mcg (50 mcg Intravenous Given 05/07/24 1509)    Followed by  morphine  (PF) 2 MG/ML injection 2 mg (has no administration in time range)  diazepam  (VALIUM ) tablet 2 mg (2 mg Oral Given 05/07/24 1451)    Medical Decision Making:   Veronica Romero is a 74 y.o. female who presents for fall and hip pain as per above.  Physical exam is pertinent for tenderness to palpation of left hip.   The differential includes but is not limited to ICH, TBI, skull fracture, spinal fracture/dislocation, blunt thoracic trauma, hemothorax, pneumothorax, rib fractures, blunt abdominal trauma, hemorrhage, extremity fracture, dislocation.  Independent historian: None  External data reviewed: Labs:  reviewed prior labs for baseline patient does have chronic anemia with baseline hemoglobin of 8-9.  Labs: Ordered CBC: Pending at the time of handoff BMP: Pending at the time of handoff  Radiology: Ordered, Independent interpretation, chest x-ray, CT head and CT C-spine had not yet been obtained at the time of handoff, radiology had not interpreted the patient's pelvic x-ray at the time of handoff, however there is overt femoral neck fracture on my interpretation. Pelvis XR: Pelvic ring intact.  Hips are located, left femoral neck fracture with displacement DG Chest 2 View Result Date: 05/07/2024 CLINICAL DATA:  Status post fall. EXAM: CHEST - 2 VIEW COMPARISON:  January 07, 2024 FINDINGS: The cardiac silhouette is borderline in size and unchanged in appearance. Chronic appearing increased lung markings are seen bilaterally with mild atelectasis and/or infiltrate noted within the left lung base. No pleural effusion or pneumothorax is identified. There is a small to moderate sized hiatal hernia. No acute osseous abnormalities are identified. IMPRESSION: 1. Chronic appearing increased lung markings with mild left  basilar atelectasis and/or infiltrate. 2. Small to moderate sized hiatal hernia. Electronically Signed   By: Suzen Dials M.D.   On: 05/07/2024 16:04   CT Cervical Spine Wo Contrast Result Date: 05/07/2024 CLINICAL DATA:  Fall with head/neck trauma. EXAM: CT HEAD WITHOUT CONTRAST CT CERVICAL SPINE WITHOUT CONTRAST TECHNIQUE: Multidetector CT imaging of the head and cervical spine was performed following the standard protocol without intravenous contrast. Multiplanar CT image reconstructions of the cervical spine were also generated. RADIATION DOSE REDUCTION: This exam was performed according to the departmental dose-optimization program which includes automated exposure control, adjustment of the mA and/or kV according to patient size and/or use of iterative reconstruction technique. COMPARISON:  None Available. FINDINGS: CT HEAD FINDINGS Brain: Ventricles, cisterns and other CSF spaces are normal. There is no mass, mass effect, shift of midline structures or acute hemorrhage. Minimal chronic ischemic microvascular disease is present. No acute infarction. Vascular: No hyperdense vessel or unexpected calcification. Skull: Normal. Negative for fracture or focal lesion. Sinuses/Orbits: Orbits are normal. Paranasal sinuses are well developed. There is opacification over the right maxillary sinus. Other: None. CT CERVICAL SPINE FINDINGS Alignment: Normal. Skull base and vertebrae: Mild spondylosis throughout the cervical spine to include uncovertebral joint spurring and facet arthropathy. Vertebral body heights are maintained. No acute fracture. Right-sided neural foraminal narrowing at the C3-4 level. Minimal left-sided neural from narrowing at the C4-5 level. Mild right-sided neural foraminal narrowing at the C5-6 level. Soft tissues and spinal canal: Prevertebral soft tissues are normal. No canal stenosis. Disc levels:  Minimal disc space narrowing at the C5-6 level. Upper chest: No acute findings. Other: None.  IMPRESSION: 1. No acute brain injury. 2. Minimal chronic ischemic microvascular disease. 3. No acute cervical spine injury. 4. Mild spondylosis of the cervical spine with minimal disc disease at the C5-6 level. 5. Right maxillary sinus inflammatory disease. Electronically Signed   By: Toribio Agreste M.D.   On: 05/07/2024 15:57   DG Hip Unilat W or Wo Pelvis 2-3 Views Left Result Date: 05/07/2024 EXAM: 2 or more VIEW(S) XRAY OF THE UNILATERAL HIP 05/07/2024 02:48:00 PM COMPARISON: None available. CLINICAL HISTORY: fall pain FINDINGS: BONES AND JOINTS: Subcapital left femoral neck fracture with varus angulation and shortening of the femoral neck. SOFT TISSUES: Vascular calcifications in pelvis. IMPRESSION: 1. Subcapital left femoral neck fracture with varus angulation and shortening of the femoral neck. Electronically signed by: Norleen Boxer MD MD 05/07/2024 03:32 PM EST RP Workstation:  [id number]    EKG/Medicine tests: Not indicated                Interventions: Fentanyl , diazepam   See the EMR for full details regarding lab and imaging results.  Currently, patient is awake, alert, and protecting own airway and is hemodynamically stable.  Patient with symptoms isolated to her left hip, does have overt displaced fracture visible on my interpretation of pelvic x-ray.  Given age, distracting injury, will obtain CT head, CT C-spine as well as screening chest x-ray.  Given patient with displaced hip fracture which will likely need operative intervention, will obtain screening labs with CBC and BMP.  Fentanyl  administered for pain control as well as diazepam  for reported muscle spasms.  Orthopedic surgery consulted, imaging and labs pending, anticipate hospitalist admission with orthopedic surgery consult barring any other acute traumatic injuries requiring trauma admission on patient's imaging.  Presentation is most consistent with acute complicated illness and Current presentation is complicated by underlying  chronic conditions  Discussion of management or test interpretations with external provider(s): None by the time of handoff  Risk Drugs:Prescription drug management and Parenteral controlled substances Treatment: Decision regarding hospitalization  Disposition: HANDOFF: At the time of signout, the patients labs and additional imaging had not yet been completed. I transferred care of the patient at the time of signout to Dr. Dreama. I informed the incoming care provider of the patient's history, status, and management plan. I addressed all of their concerns and/or questions to the best of my ability. Please refer to the incoming care provider's note for details regarding the remainder of the patient's ED course and disposition.  MDM generated using voice dictation software and may contain dictation errors.  Please contact me for any clarification or with any questions.  Clinical Impression:  1. Closed fracture of neck of left femur, initial encounter (HCC)   2. Fall, initial encounter      Admit   Final Clinical Impression(s) / ED Diagnoses Final diagnoses:  Closed fracture of neck of left femur, initial encounter (HCC)  Fall, initial encounter    Rx / DC Orders ED Discharge Orders     None        Rogelia Jerilynn RAMAN, MD 05/07/24 1607  "

## 2024-05-08 ENCOUNTER — Encounter (HOSPITAL_COMMUNITY)
Admission: EM | Disposition: A | Discharge status: Home Health | Payer: Self-pay | Source: Home / Self Care | Attending: Internal Medicine

## 2024-05-08 ENCOUNTER — Inpatient Hospital Stay (HOSPITAL_COMMUNITY): Admitting: Anesthesiology

## 2024-05-08 ENCOUNTER — Inpatient Hospital Stay (HOSPITAL_COMMUNITY)

## 2024-05-08 ENCOUNTER — Inpatient Hospital Stay: Admitting: Physician Assistant

## 2024-05-08 ENCOUNTER — Encounter (HOSPITAL_COMMUNITY): Payer: Self-pay | Admitting: Internal Medicine

## 2024-05-08 ENCOUNTER — Inpatient Hospital Stay

## 2024-05-08 DIAGNOSIS — I110 Hypertensive heart disease with heart failure: Secondary | ICD-10-CM

## 2024-05-08 DIAGNOSIS — I5020 Unspecified systolic (congestive) heart failure: Secondary | ICD-10-CM

## 2024-05-08 DIAGNOSIS — I2510 Atherosclerotic heart disease of native coronary artery without angina pectoris: Secondary | ICD-10-CM | POA: Diagnosis not present

## 2024-05-08 DIAGNOSIS — S72002A Fracture of unspecified part of neck of left femur, initial encounter for closed fracture: Secondary | ICD-10-CM | POA: Diagnosis not present

## 2024-05-08 HISTORY — PX: TOTAL HIP ARTHROPLASTY: SHX124

## 2024-05-08 LAB — GLUCOSE, CAPILLARY
Glucose-Capillary: 126 mg/dL — ABNORMAL HIGH (ref 70–99)
Glucose-Capillary: 103 mg/dL — ABNORMAL HIGH (ref 70–99)
Glucose-Capillary: 146 mg/dL — ABNORMAL HIGH (ref 70–99)
Glucose-Capillary: 201 mg/dL — ABNORMAL HIGH (ref 70–99)
Glucose-Capillary: 183 mg/dL — ABNORMAL HIGH (ref 70–99)

## 2024-05-08 LAB — TROPONIN T, HIGH SENSITIVITY: Troponin T High Sensitivity: <15 ng/L (ref 0–19)

## 2024-05-08 LAB — BASIC METABOLIC PANEL WITH GFR
Anion gap: 10 (ref 5–15)
BUN: 14 mg/dL (ref 8–23)
CO2: 24 mmol/L (ref 22–32)
Calcium: 8.9 mg/dL (ref 8.9–10.3)
Chloride: 102 mmol/L (ref 98–111)
Creatinine, Ser: 0.74 mg/dL (ref 0.44–1.00)
GFR, Estimated: >60 mL/min
Glucose, Bld: 120 mg/dL — ABNORMAL HIGH (ref 70–99)
Potassium: 3.8 mmol/L (ref 3.5–5.1)
Sodium: 136 mmol/L (ref 135–145)

## 2024-05-08 LAB — CBC
HCT: 33.6 % — ABNORMAL LOW (ref 36.0–46.0)
Hemoglobin: 11.6 g/dL — ABNORMAL LOW (ref 12.0–15.0)
MCH: 32.8 pg (ref 26.0–34.0)
MCHC: 34.5 g/dL (ref 30.0–36.0)
MCV: 94.9 fL (ref 80.0–100.0)
Platelets: 207 K/uL (ref 150–400)
RBC: 3.54 MIL/uL — ABNORMAL LOW (ref 3.87–5.11)
RDW: 15.3 % (ref 11.5–15.5)
WBC: 10.2 K/uL (ref 4.0–10.5)
nRBC: 0 % (ref 0.0–0.2)

## 2024-05-08 LAB — MAGNESIUM: Magnesium: 2 mg/dL (ref 1.7–2.4)

## 2024-05-08 MED ORDER — CHLORHEXIDINE GLUCONATE 4 % EX SOLN: 60.0000 mL | Freq: Once | CUTANEOUS | Status: DC

## 2024-05-08 MED ORDER — FENTANYL CITRATE (PF) 100 MCG/2ML IJ SOLN: 50.0000 ug | Freq: Once | INTRAMUSCULAR | Status: DC

## 2024-05-08 MED ORDER — LIDOCAINE 2% (20 MG/ML) 5 ML SYRINGE
INTRAMUSCULAR | Status: DC | PRN
  Administered 2024-05-08: 20 mg via INTRAVENOUS

## 2024-05-08 MED ORDER — FENTANYL CITRATE (PF) 100 MCG/2ML IJ SOLN
INTRAMUSCULAR | Status: AC
  Filled 2024-05-08: qty 2

## 2024-05-08 MED ORDER — FENTANYL CITRATE (PF) 250 MCG/5ML IJ SOLN
INTRAMUSCULAR | Status: DC | PRN
  Administered 2024-05-08 (×5): 50 ug via INTRAVENOUS
  Administered 2024-05-08: 100 ug via INTRAVENOUS
  Administered 2024-05-08 (×2): 50 ug via INTRAVENOUS

## 2024-05-08 MED ORDER — FENTANYL CITRATE (PF) 100 MCG/2ML IJ SOLN: 100.0000 ug | Freq: Once | INTRAMUSCULAR | Status: DC

## 2024-05-08 MED ORDER — LIDOCAINE 2% (20 MG/ML) 5 ML SYRINGE
INTRAMUSCULAR | Status: AC
  Filled 2024-05-08: qty 5

## 2024-05-08 MED ORDER — PHENYLEPHRINE 80 MCG/ML (10ML) SYRINGE FOR IV PUSH (FOR BLOOD PRESSURE SUPPORT)
PREFILLED_SYRINGE | INTRAVENOUS | Status: AC
  Filled 2024-05-08: qty 10

## 2024-05-08 MED ORDER — 0.9 % SODIUM CHLORIDE (POUR BTL) OPTIME
TOPICAL | Status: DC | PRN
  Administered 2024-05-08: 1000 mL

## 2024-05-08 MED ORDER — MENTHOL 3 MG MT LOZG: 1.0000 | LOZENGE | OROMUCOSAL | Status: DC | PRN

## 2024-05-08 MED ORDER — HYDROCODONE-ACETAMINOPHEN 5-325 MG PO TABS
1.0000 | ORAL_TABLET | Freq: Four times a day (QID) | ORAL | Status: DC | PRN
  Administered 2024-05-08 (×2): 2 via ORAL
  Administered 2024-05-09 – 2024-05-10 (×4): 1 via ORAL
  Filled 2024-05-08: qty 2
  Filled 2024-05-08 (×3): qty 1
  Filled 2024-05-08: qty 2
  Filled 2024-05-08: qty 1

## 2024-05-08 MED ORDER — LACTATED RINGERS IV SOLN: INTRAVENOUS | Status: DC

## 2024-05-08 MED ORDER — ORAL CARE MOUTH RINSE: 15.0000 mL | Freq: Once | OROMUCOSAL | Status: AC

## 2024-05-08 MED ORDER — CEFAZOLIN SODIUM 1 G IJ SOLR
INTRAMUSCULAR | Status: AC
  Filled 2024-05-08: qty 20

## 2024-05-08 MED ORDER — SUGAMMADEX SODIUM 200 MG/2ML IV SOLN
INTRAVENOUS | Status: DC | PRN
  Administered 2024-05-08 (×2): 100 mg via INTRAVENOUS

## 2024-05-08 MED ORDER — CHLORHEXIDINE GLUCONATE 0.12 % MT SOLN: 15.0000 mL | Freq: Once | OROMUCOSAL | Status: AC

## 2024-05-08 MED ORDER — METHYLPREDNISOLONE SODIUM SUCC 125 MG IJ SOLR
INTRAMUSCULAR | Status: DC | PRN
  Administered 2024-05-08: 125 mg via INTRAVENOUS

## 2024-05-08 MED ORDER — ONDANSETRON HCL 4 MG/2ML IJ SOLN: 4.0000 mg | Freq: Four times a day (QID) | INTRAMUSCULAR | Status: DC | PRN

## 2024-05-08 MED ORDER — PHENOL 1.4 % MT LIQD: 1.0000 | OROMUCOSAL | Status: DC | PRN

## 2024-05-08 MED ORDER — TRANEXAMIC ACID-NACL 1000-0.7 MG/100ML-% IV SOLN
1000.0000 mg | INTRAVENOUS | Status: AC
  Administered 2024-05-08: 1000 mg via INTRAVENOUS
  Filled 2024-05-08: qty 100

## 2024-05-08 MED ORDER — TRANEXAMIC ACID-NACL 1000-0.7 MG/100ML-% IV SOLN
1000.0000 mg | Freq: Once | INTRAVENOUS | Status: AC
  Administered 2024-05-08: 1000 mg via INTRAVENOUS
  Filled 2024-05-08: qty 100

## 2024-05-08 MED ORDER — CEFAZOLIN SODIUM-DEXTROSE 2-4 GM/100ML-% IV SOLN
INTRAVENOUS | Status: AC
  Filled 2024-05-08: qty 100

## 2024-05-08 MED ORDER — FENTANYL CITRATE (PF) 100 MCG/2ML IJ SOLN
25.0000 ug | INTRAMUSCULAR | Status: DC | PRN
  Administered 2024-05-08: 25 ug via INTRAVENOUS

## 2024-05-08 MED ORDER — CEFAZOLIN SODIUM-DEXTROSE 2-4 GM/100ML-% IV SOLN
2.0000 g | Freq: Four times a day (QID) | INTRAVENOUS | Status: AC
  Administered 2024-05-08: 2 g via INTRAVENOUS
  Filled 2024-05-08: qty 100

## 2024-05-08 MED ORDER — ONDANSETRON HCL 4 MG PO TABS: 4.0000 mg | ORAL_TABLET | Freq: Four times a day (QID) | ORAL | Status: DC | PRN

## 2024-05-08 MED ORDER — MORPHINE SULFATE (PF) 2 MG/ML IV SOLN: 0.5000 mg | INTRAVENOUS | Status: DC | PRN

## 2024-05-08 MED ORDER — POVIDONE-IODINE 10 % EX SWAB
2.0000 | Freq: Once | CUTANEOUS | Status: AC
  Administered 2024-05-08: 2 via TOPICAL

## 2024-05-08 MED ORDER — VANCOMYCIN HCL 1 G IV SOLR
INTRAVENOUS | Status: DC | PRN
  Administered 2024-05-08: 1000 mg via TOPICAL

## 2024-05-08 MED ORDER — ROCURONIUM BROMIDE 10 MG/ML (PF) SYRINGE
PREFILLED_SYRINGE | INTRAVENOUS | Status: DC | PRN
  Administered 2024-05-08: 60 mg via INTRAVENOUS
  Administered 2024-05-08 (×2): 10 mg via INTRAVENOUS

## 2024-05-08 MED ORDER — METOCLOPRAMIDE HCL 5 MG PO TABS: 5.0000 mg | ORAL_TABLET | Freq: Three times a day (TID) | ORAL | Status: DC | PRN

## 2024-05-08 MED ORDER — PROPOFOL 10 MG/ML IV BOLUS
INTRAVENOUS | Status: DC | PRN
  Administered 2024-05-08: 100 mg via INTRAVENOUS

## 2024-05-08 MED ORDER — SODIUM CHLORIDE 0.9 % IR SOLN
Status: DC | PRN
  Administered 2024-05-08: 1000 mL

## 2024-05-08 MED ORDER — PHENYLEPHRINE 80 MCG/ML (10ML) SYRINGE FOR IV PUSH (FOR BLOOD PRESSURE SUPPORT)
PREFILLED_SYRINGE | INTRAVENOUS | Status: DC | PRN
  Administered 2024-05-08 (×6): 80 ug via INTRAVENOUS

## 2024-05-08 MED ORDER — PROPOFOL 10 MG/ML IV BOLUS
INTRAVENOUS | Status: AC
  Filled 2024-05-08: qty 20

## 2024-05-08 MED ORDER — ONDANSETRON HCL 4 MG/2ML IJ SOLN
INTRAMUSCULAR | Status: DC | PRN
  Administered 2024-05-08: 4 mg via INTRAVENOUS

## 2024-05-08 MED ORDER — MORPHINE SULFATE (PF) 2 MG/ML IV SOLN
1.0000 mg | INTRAVENOUS | Status: DC | PRN
  Administered 2024-05-08: 1 mg via INTRAVENOUS
  Filled 2024-05-08: qty 1

## 2024-05-08 MED ORDER — VANCOMYCIN HCL 1000 MG IV SOLR
INTRAVENOUS | Status: AC
  Filled 2024-05-08: qty 20

## 2024-05-08 MED ORDER — GLUCERNA SHAKE PO LIQD
237.0000 mL | Freq: Three times a day (TID) | ORAL | Status: DC
  Administered 2024-05-09 – 2024-05-12 (×8): 237 mL via ORAL

## 2024-05-08 MED ORDER — ALBUMIN HUMAN 5 % IV SOLN: INTRAVENOUS | Status: DC | PRN

## 2024-05-08 MED ORDER — ROCURONIUM BROMIDE 10 MG/ML (PF) SYRINGE
PREFILLED_SYRINGE | INTRAVENOUS | Status: AC
  Filled 2024-05-08: qty 10

## 2024-05-08 MED ORDER — METHOCARBAMOL 500 MG PO TABS
500.0000 mg | ORAL_TABLET | Freq: Three times a day (TID) | ORAL | Status: DC | PRN
  Administered 2024-05-09 – 2024-05-10 (×3): 500 mg via ORAL
  Filled 2024-05-08 (×3): qty 1

## 2024-05-08 MED ORDER — METHOCARBAMOL 1000 MG/10ML IJ SOLN: 500.0000 mg | Freq: Three times a day (TID) | INTRAMUSCULAR | Status: DC | PRN

## 2024-05-08 MED ORDER — METOCLOPRAMIDE HCL 5 MG/ML IJ SOLN: 5.0000 mg | Freq: Three times a day (TID) | INTRAMUSCULAR | Status: DC | PRN

## 2024-05-08 MED ORDER — HYDROMORPHONE HCL 1 MG/ML IJ SOLN
INTRAMUSCULAR | Status: DC | PRN
  Administered 2024-05-08: .5 mg via INTRAVENOUS

## 2024-05-08 MED ORDER — CHLORHEXIDINE GLUCONATE 0.12 % MT SOLN
OROMUCOSAL | Status: AC
  Administered 2024-05-08: 15 mL via OROMUCOSAL
  Filled 2024-05-08: qty 15

## 2024-05-08 MED ORDER — HYDROMORPHONE HCL 1 MG/ML IJ SOLN
INTRAMUSCULAR | Status: AC
  Filled 2024-05-08: qty 0.5

## 2024-05-08 MED ORDER — CEFAZOLIN SODIUM-DEXTROSE 2-4 GM/100ML-% IV SOLN
2.0000 g | INTRAVENOUS | Status: AC
  Administered 2024-05-08: 2 g via INTRAVENOUS

## 2024-05-08 MED ORDER — ONDANSETRON HCL 4 MG/2ML IJ SOLN
INTRAMUSCULAR | Status: AC
  Filled 2024-05-08: qty 2

## 2024-05-08 MED ORDER — FENTANYL CITRATE (PF) 250 MCG/5ML IJ SOLN
INTRAMUSCULAR | Status: AC
  Filled 2024-05-08: qty 5

## 2024-05-08 NOTE — Progress Notes (Signed)
 " PROGRESS NOTE    [name]  FMW:[id number] DOB: [birth year] DOA: 05/07/2024 PCP: Melvin Pao, NP   Brief Narrative: Veronica Romero is a 74 y.o. female with medical history significant of ischemic cardiomyopathy last ejection fraction 45 to 50% 05/07/2023, diabetes, ILD, hypertension, CAD, pulmonary hypertension, rheumatoid arthritis, who presented after a mechanical fall. Patient was found to have Subcapital left femoral neck fracture with varus angulation and shortening of the femoral neck .      Assessment & Plan:   Principal Problem:   Hip fracture (HCC)  1-Subcapital left femoral neck fracture; - Patient presented after a mechanical fall found to have femoral neck fracture.  Orthopedic has been consulted, plan for surgical intervention tomorrow - She does have multiple risk factors and chronic medical condition but they are all stable and well-controlled - EKG: and pro-BNP: 365, no evidence of HF exacerbation. Plan to cycle troponin for completeness  - Charlanne perioperative risk 0.7%. Revised cardiac index of 5 %./ BNP more than 300, will monitor troponin for 48 hours.  -Having hip pain this am, increase IV morphine .  -Continue with laxatives.  Patient underwent left total hip arthroplasty of the left femoral neck fracture on 1/9 by Dr. Kendal  Interstitial lung disease -Chest x-ray: Chronic appearing increased lung markings with mild left basilar atelectasis and/or infiltrate. - Not on home oxygen - No appear to be on any inhaler at home -PRN nebulizers   History of ischemic cardiomyopathy with improved ejection fraction - History of OCT guided complex atherectomy to ostial mid LAD with DES 3.0 x 30 mm in 06/2021.  She has residual 50% stenosis in the mid RCA and distal LCx. - Patient denies chest pain, she is able to ambulate several blocks without getting shortness of breath or chest pain. -EKG: sinus rhythm, similar to EKG from 01/07/2024  and proBNP 360 - Will hold  Plavix  prior to surgery, resume as possible postsurgery. -Continue with aspirin .  - Cardiology consulted  Diabetes type 2 - Not on medication.  Monitor CBG   Metabolic acidosis; Resolved.    Rheumatoid arthritis - On Humira and low-dose prednisone  -Continue chronic dose of prednisone  5 mg daily   Iron deficiency anemia: Continue folic acid  and B12 supplements.  Monitor hemoglobin   Leukocytosis: Suspect demargination.  Stress.  Resolved.      Estimated body mass index is 25.06 kg/m as calculated from the following:   Height as of this encounter: 5' 7 (1.702 m).   Weight as of this encounter: 72.6 kg.   DVT prophylaxis: Lovenox  Code Status: Full code Family Communication:Daughter at bedside Disposition Plan:  Status is: Inpatient Remains inpatient appropriate because: management of  hip fracture.     Consultants:  Ortho Cardiology  Procedures:  Patient underwent left total hip arthroplasty of the left femoral neck fracture on 1/9  Antimicrobials:    Subjective: Patient is alert and conversant denies chest pain shortness of breath.  She is having severe hip pain.  Nurse was informed.  Objective: Vitals:   05/07/24 1740 05/07/24 1745 05/07/24 2035 05/08/24 0239  BP: (!) 189/162 (!) 166/54 (!) 141/59 (!) 143/54  Pulse:  93 92 88  Resp:  (!) 28 19 18   Temp:   97.9 F (36.6 C) 98.8 F (37.1 C)  TempSrc:   Oral Oral  SpO2:  100% 97% 97%  Weight:      Height:       No intake or output data in the 24 hours ending  05/08/24 0734 Filed Weights   05/07/24 1350  Weight: 72.6 kg    Examination:  General exam: Appears calm and comfortable  Respiratory system: Clear to auscultation. Respiratory effort normal. Cardiovascular system: S1 & S2 heard, RRR. No JVD, murmurs, rubs, gallops or clicks. No pedal edema. Gastrointestinal system: Abdomen is nondistended, soft and nontender. No organomegaly or masses felt. Normal bowel sounds heard. Central nervous system:  Alert and oriented. No focal neurological deficits. Extremities: Left lower extremity shorter than right   Data Reviewed: I have personally reviewed following labs and imaging studies  CBC: Recent Labs  Lab 05/07/24 1510 05/08/24 0554  WBC 14.5* 10.2  NEUTROABS 10.5*  --   HGB 12.2 11.6*  HCT 37.0 33.6*  MCV 99.7 94.9  PLT 229 207   Basic Metabolic Panel: Recent Labs  Lab 05/07/24 1510 05/07/24 2021 05/08/24 0554  NA 137  --  136  K 4.1  --  3.8  CL 104  --  102  CO2 17*  --  24  GLUCOSE 100*  --  120*  BUN 23  --  14  CREATININE 0.73 0.73 0.74  CALCIUM  9.0  --  8.9   GFR: Estimated Creatinine Clearance: 59.1 mL/min (by C-G formula based on SCr of 0.74 mg/dL). Liver Function Tests: No results for input(s): AST, ALT, ALKPHOS, BILITOT, PROT, ALBUMIN  in the last 168 hours. No results for input(s): LIPASE, AMYLASE in the last 168 hours. No results for input(s): AMMONIA in the last 168 hours. Coagulation Profile: No results for input(s): INR, PROTIME in the last 168 hours. Cardiac Enzymes: No results for input(s): CKTOTAL, CKMB, CKMBINDEX, TROPONINI in the last 168 hours. BNP (last 3 results) Recent Labs    05/07/24 2021  PROBNP 365.0*   HbA1C: No results for input(s): HGBA1C in the last 72 hours. CBG: Recent Labs  Lab 05/07/24 2110 05/08/24 0604  GLUCAP 125* 126*   Lipid Profile: No results for input(s): CHOL, HDL, LDLCALC, TRIG, CHOLHDL, LDLDIRECT in the last 72 hours. Thyroid Function Tests: No results for input(s): TSH, T4TOTAL, FREET4, T3FREE, THYROIDAB in the last 72 hours. Anemia Panel: No results for input(s): VITAMINB12, FOLATE, FERRITIN, TIBC, IRON, RETICCTPCT in the last 72 hours. Sepsis Labs: No results for input(s): PROCALCITON, LATICACIDVEN in the last 168 hours.  Recent Results (from the past 240 hours)  Surgical PCR screen     Status: None   Collection Time: 05/07/24   8:31 PM   Specimen: Nasal Mucosa; Nasal Swab  Result Value Ref Range Status   MRSA, PCR NEGATIVE NEGATIVE Final   Staphylococcus aureus NEGATIVE NEGATIVE Final    Comment: (NOTE) The Xpert SA Assay (FDA approved for NASAL specimens in patients 58 years of age and older), is one component of a comprehensive surveillance program. It is not intended to diagnose infection nor to guide or monitor treatment. Performed at [facility], [address] [place], [place] [address]          Radiology Studies: DG Chest 2 View Result Date: 05/07/2024 CLINICAL DATA:  Status post fall. EXAM: CHEST - 2 VIEW COMPARISON:  January 07, 2024 FINDINGS: The cardiac silhouette is borderline in size and unchanged in appearance. Chronic appearing increased lung markings are seen bilaterally with mild atelectasis and/or infiltrate noted within the left lung base. No pleural effusion or pneumothorax is identified. There is a small to moderate sized hiatal hernia. No acute osseous abnormalities are identified. IMPRESSION: 1. Chronic appearing increased lung markings with mild left basilar  atelectasis and/or infiltrate. 2. Small to moderate sized hiatal hernia. Electronically Signed   By: Suzen Dials M.D.   On: 05/07/2024 16:04   CT Cervical Spine Wo Contrast Result Date: 05/07/2024 CLINICAL DATA:  Fall with head/neck trauma. EXAM: CT HEAD WITHOUT CONTRAST CT CERVICAL SPINE WITHOUT CONTRAST TECHNIQUE: Multidetector CT imaging of the head and cervical spine was performed following the standard protocol without intravenous contrast. Multiplanar CT image reconstructions of the cervical spine were also generated. RADIATION DOSE REDUCTION: This exam was performed according to the departmental dose-optimization program which includes automated exposure control, adjustment of the mA and/or kV according to patient size and/or use of iterative reconstruction technique. COMPARISON:  None Available. FINDINGS: CT HEAD  FINDINGS Brain: Ventricles, cisterns and other CSF spaces are normal. There is no mass, mass effect, shift of midline structures or acute hemorrhage. Minimal chronic ischemic microvascular disease is present. No acute infarction. Vascular: No hyperdense vessel or unexpected calcification. Skull: Normal. Negative for fracture or focal lesion. Sinuses/Orbits: Orbits are normal. Paranasal sinuses are well developed. There is opacification over the right maxillary sinus. Other: None. CT CERVICAL SPINE FINDINGS Alignment: Normal. Skull base and vertebrae: Mild spondylosis throughout the cervical spine to include uncovertebral joint spurring and facet arthropathy. Vertebral body heights are maintained. No acute fracture. Right-sided neural foraminal narrowing at the C3-4 level. Minimal left-sided neural from narrowing at the C4-5 level. Mild right-sided neural foraminal narrowing at the C5-6 level. Soft tissues and spinal canal: Prevertebral soft tissues are normal. No canal stenosis. Disc levels:  Minimal disc space narrowing at the C5-6 level. Upper chest: No acute findings. Other: None. IMPRESSION: 1. No acute brain injury. 2. Minimal chronic ischemic microvascular disease. 3. No acute cervical spine injury. 4. Mild spondylosis of the cervical spine with minimal disc disease at the C5-6 level. 5. Right maxillary sinus inflammatory disease. Electronically Signed   By: Toribio Agreste M.D.   On: 05/07/2024 15:57   DG Hip Unilat W or Wo Pelvis 2-3 Views Left Result Date: 05/07/2024 EXAM: 2 or more VIEW(S) XRAY OF THE UNILATERAL HIP 05/07/2024 02:48:00 PM COMPARISON: None available. CLINICAL HISTORY: fall pain FINDINGS: BONES AND JOINTS: Subcapital left femoral neck fracture with varus angulation and shortening of the femoral neck. SOFT TISSUES: Vascular calcifications in pelvis. IMPRESSION: 1. Subcapital left femoral neck fracture with varus angulation and shortening of the femoral neck. Electronically signed by: Norleen Boxer MD MD 05/07/2024 03:32 PM EST RP Workstation: [id number]        Scheduled Meds:  aspirin  EC  81 mg Oral Daily   cyanocobalamin   1,000 mcg Oral Daily   docusate sodium   100 mg Oral BID   enoxaparin  (LOVENOX ) injection  40 mg Subcutaneous Q24H   folic acid   1,000 mcg Oral Daily   multivitamin with minerals  1 tablet Oral Daily   mupirocin  ointment  1 Application Nasal BID   pantoprazole   40 mg Oral Daily   predniSONE   5 mg Oral q AM   rosuvastatin   40 mg Oral Daily   senna  1 tablet Oral BID   Continuous Infusions:   LOS: 1 day    Time spent: 35 minutes    Ajaya Crutchfield A Ranika Mcniel, MD [facility]   If 7PM-7AM, please contact night-coverage [email or web address]  05/08/2024, 7:34 AM   "

## 2024-05-08 NOTE — Transfer of Care (Signed)
 Immediate Anesthesia Transfer of Care Note  Patient: Veronica Romero  Procedure(s) Performed: ARTHROPLASTY, HIP, TOTAL, ANTERIOR APPROACH (Left: Hip)  Patient Location: PACU  Anesthesia Type:General  Level of Consciousness: patient cooperative and responds to stimulation, easily arosed  Airway & Oxygen Therapy: Patient Spontanous Breathing and Patient connected to nasal cannula oxygen  Post-op Assessment: Report given to RN and Post -op Vital signs reviewed and stable  Post vital signs: Reviewed and stable  Last Vitals:  Vitals Value Taken Time  BP 101/57 05/08/24 13:08  Temp    Pulse 96 05/08/24 13:14  Resp 17 05/08/24 13:14  SpO2 95 % 05/08/24 13:14  Vitals shown include unfiled device data.  Last Pain:  Vitals:   05/08/24 0923  TempSrc:   PainSc: 7          Complications: No notable events documented.

## 2024-05-08 NOTE — Progress Notes (Signed)
 Initial Nutrition Assessment  DOCUMENTATION CODES:  Not applicable  INTERVENTION:  Regular diet post op. Do not restrict diet so as to encourage PO intake. Glucerna Shake PO TID. Each supplement provides 220 Kcals and 10 grams of protein. Continue multivitamin, folic acid , and Vit B12 per provider.  NUTRITION DIAGNOSIS:  Increased nutrient needs related to hip fracture as evidenced by estimated needs   GOAL:  Patient will meet greater than or equal to 90% of their needs   MONITOR:  PO intake, Supplement acceptance, Labs, Weight trends  REASON FOR ASSESSMENT:  Consult Hip fracture protocol  ASSESSMENT:  Patient presented with L femoral neck fracture s/p fall. PMH significant for interstitial lung disease, ischemic cardiomyopathy, DM2, HTN, dyslipidemia, CAD, osteoporosis, arthritis, and moderate acute malnutrition 2023.  The patient is currently in the OR for total hip arthroplasty for closed fracture of L femur neck. No immediate nutrition issues were identified per chart/H&P.  Scheduled Meds:  [MAR Hold] aspirin  EC  81 mg Oral Daily   chlorhexidine   60 mL Topical Once   [MAR Hold] cyanocobalamin   1,000 mcg Oral Daily   [MAR Hold] docusate sodium   100 mg Oral BID   [MAR Hold] enoxaparin  (LOVENOX ) injection  40 mg Subcutaneous Q24H   [MAR Hold] folic acid   1,000 mcg Oral Daily   [MAR Hold] multivitamin with minerals  1 tablet Oral Daily   [MAR Hold] mupirocin  ointment  1 Application Nasal BID   [MAR Hold] pantoprazole   40 mg Oral Daily   [MAR Hold] predniSONE   5 mg Oral q AM   [MAR Hold] rosuvastatin   40 mg Oral Daily   [MAR Hold] senna  1 tablet Oral BID   Continuous Infusions:  ceFAZolin       ceFAZolin  (ANCEF ) IV     lactated ringers  10 mL/hr at 05/08/24 0928   tranexamic acid      PRN Meds:.[MAR Hold] artificial tears, ceFAZolin , [MAR Hold] hydrALAZINE , [MAR Hold] HYDROcodone -acetaminophen , [MAR Hold] ipratropium-albuterol , [MAR Hold]  morphine  injection, [MAR Hold]  nitroGLYCERIN , [MAR Hold] polyethylene glycol  Diet Order             Diet NPO time specified  Diet effective midnight                  Meal Intake: N/A  Labs:     Latest Ref Rng & Units 05/08/2024    5:54 AM 05/07/2024    8:21 PM 05/07/2024    3:10 PM  CMP  Glucose 70 - 99 mg/dL 879   899   BUN 8 - 23 mg/dL 14   23   Creatinine 9.55 - 1.00 mg/dL 9.25  9.26  9.26   Sodium 135 - 145 mmol/L 136   137   Potassium 3.5 - 5.1 mmol/L 3.8   4.1   Chloride 98 - 111 mmol/L 102   104   CO2 22 - 32 mmol/L 24   17   Calcium  8.9 - 10.3 mg/dL 8.9   9.0     I/O: No data charted  NUTRITION - FOCUSED PHYSICAL EXAM: Deferred due to patient currently in OR  EDUCATION NEEDS:  Not appropriate for education at this time  Skin:  Skin Assessment: Reviewed RN Assessment (anticipate hip incision)  Last BM:  No data charted  Height:  Ht Readings from Last 1 Encounters:  05/08/24 (P) 5' 7 (1.702 m)   Weight:  Wt Readings from Last 10 Encounters:  05/08/24 (P) 72.6 kg  03/05/24 73 kg  02/26/24 74.6 kg  02/19/24 74.2 kg  02/18/24 74.4 kg  01/29/24 73 kg  01/20/24 73 kg  01/07/24 70.3 kg  12/17/23 74.8 kg  10/31/23 72.6 kg   Weight Change: relatively stable weight noted  Usual Body Weight: unable to determine  Edema: +2 BLE  Ideal Body Weight:  61.4 kg   BMI:  Body mass index is 25.06 kg/m (pended).  Estimated Daily Nutritional Needs:  Kcal:  1700-1900 Protein:  90-110 g Fluid:  >/=1700 mL    Veronica Ruth, MS, RDN, LDN [place]. [facility] See AMION for contact information Secure chat preferred

## 2024-05-08 NOTE — Anesthesia Postprocedure Evaluation (Signed)
"   Anesthesia Post Note  Patient: Veronica Romero  Procedure(s) Performed: ARTHROPLASTY, HIP, TOTAL, ANTERIOR APPROACH (Left: Hip)     Patient location during evaluation: PACU Anesthesia Type: General Level of consciousness: awake and alert, oriented and patient cooperative Pain management: pain level controlled Vital Signs Assessment: post-procedure vital signs reviewed and stable Respiratory status: spontaneous breathing, nonlabored ventilation and respiratory function stable Cardiovascular status: blood pressure returned to baseline and stable Postop Assessment: no apparent nausea or vomiting Anesthetic complications: no   No notable events documented.  Last Vitals:  Vitals:   05/08/24 1330 05/08/24 1340  BP:  (!) 141/61  Pulse: 94 90  Resp: (!) 21 20  Temp:    SpO2: 100% 100%    Last Pain:  Vitals:   05/08/24 1330  TempSrc:   PainSc: 0-No pain                 Sheral Pfahler,E. Ngoc Detjen      "

## 2024-05-08 NOTE — Op Note (Signed)
 Orthopaedic Surgery Operative Note (CSN: [id number] ) Date of Surgery: 05/08/2024  Admit Date: 05/07/2024   Diagnoses: Pre-Op Diagnoses: Left displaced femoral neck fracture  Post-Op Diagnosis: Same  Procedures: CPT 27130-Left total hip arthroplasty for left femoral neck fracture  Surgeons : Primary: Kendal Franky SQUIBB, MD  Assistant: Lauraine Moores, PA-C  Location: OR 3   Anesthesia: General   Antibiotics: Ancef  2g preop with 1 gm vancomycin  powder placed topically   Tourniquet time: None    Estimated Blood Loss: 700 mL  Complications:* No complications entered in OR log *   Specimens:* No specimens in log *   Implants: Implant Name Type Inv. Item Serial No. Manufacturer Lot No. LRB No. Used Action  SHELL ACETAB 3H 52 E HIP - [id number] Shell SHELL ACETAB 3H 52 E HIP  ZIMMER RECON(ORTH,TRAU,BIO,SG) [id number] Left 1 Implanted  LINER ACE G7 HIGH 36 SZ E - [id number] Liner LINER ACE G7 HIGH 36 SZ E  ZIMMER RECON(ORTH,TRAU,BIO,SG) [id number] Left 1 Implanted  CEMENT RESTRICTOR BONE PREP ST - [id number] Cement CEMENT RESTRICTOR BONE PREP ST  STRYKER INSTRUMENTS [id number] Left 1 Implanted  STEM HIP FEM 9MMX130MM TAPER - [id number] Stem STEM HIP FEM 9MMX130MM TAPER  ZIMMER RECON(ORTH,TRAU,BIO,SG)  Left 1 Implanted  STEM CENTRALIZER DISTAL HIP 10 - [id number] Stem STEM CENTRALIZER DISTAL HIP 10  ZIMMER RECON(ORTH,TRAU,BIO,SG) [id number] Left 1 Implanted  STEM HIP FEM 9MMX130MM TAPER - [id number] Stem STEM HIP FEM 9MMX130MM TAPER  ZIMMER RECON(ORTH,TRAU,BIO,SG) [id number] Left 1 Implanted  SIMPLEX P SPEEDSET CEMENT Cement   STRYKER ORTHOPAEDICS [id number] Left 1 Implanted  SIMPLEX P SPEEDSET CEMENT Cement   STRYKER ORTHOPAEDICS [id number] Left 1 Implanted  HEAD FEM -3XOFST 36XMDLR - [id number] Head HEAD FEM -3XOFST 36XMDLR  ZIMMER RECON(ORTH,TRAU,BIO,SG) [id number] Left 1 Implanted     Indications for Surgery: 74 year old female who sustained a ground-level fall with a left femoral neck fracture.  Due to  the unstable nature of her injury I recommend proceeding with left total hip arthroplasty.  Risks and benefits were discussed with the patient.  Risks included but not limited to bleeding, infection, periprosthetic fracture, hip dislocation, leg length discrepancy, nerve and blood vessel injury, even the possibility of DVT and anesthetic complications.  They agreed to proceed with surgery and consent was obtained.  Operative Findings: 1.  Left total hip arthroplasty through anterior approach for femoral neck fracture using [employer] G7 acetabular shell 52 mm size for a size E liner 2.  G7 acetabular system vitamin E highly cross-linked polyethylene neutral liner for 36 mm head size E 3.  Cemented Zimmer [employer] echo fracture hip system standard offset femoral stem size 9 4.  [employer] a cobalt chrome modular head component 36 mm head with a -3 mm neck  Procedure: The patient was identified in the preoperative holding area. Consent was confirmed with the patient and their family and all questions were answered. The operative extremity was marked after confirmation with the patient. she was then brought back to the operating room by our anesthesia colleagues.  She was placed under general anesthetic and carefully transferred over to the [facility] table.  Fluoroscopic imaging showed the unstable nature of her injury.  The left hip and pelvis were prepped and draped in usual sterile fashion.  A timeout was performed to verify the patient, the procedure, and the extremity.  Preoperative antibiotics were dosed.  Standard approach to the anterior hip was made and carried down through skin and subcutaneous tissue.  I incised through the TFL fascia and  developed the interval between the TFL and sartorius.  I cauterized the crossing vessels and expose the anterior capsule.  I then performed a capsulotomy and released it all the way down to the lesser trochanter.  I then was able to remove the femoral head with a  corkscrew.  I sized this on the back table which was approximately 46 to 47 mm in size.  I then performed soft tissue release of the capsule and the acetabular labrum to expose the acetabulum appropriately.  I then sequentially reamed from size 45 until size 51 and I got excellent peripheral fit.  I then proceeded to press-fit a 52 mm shell into the acetabulum using fluoroscopic imaging as a guide to correct my version and abduction.  There was good stability of the acetabular shell and no supplemental fixation was needed.  I then placed a vitamin E highly cross-linked polyethylene for a 36 mm head for a neutral liner.  I then turned my attention to the femur.  I externally rotated the femur to 125 degrees and I delivered to leg into the wound.  There was extension of the femoral neck fracture into the calcar.  I performed a superior capsular release to allow for appropriately positioned for broaching of the femur.  I used a canal finder and started to broach the femur.  However I was concerned regarding the extension of the fracture and I decided to cement the stem.  I then used reamers to ream the canal up to a size 11.  I then proceeded to broach until I was able to get a size 11.  I then chose to cement and a size 9 standard offset neck.  The canal was prepared the cement restrictor was placed below the planned stem.  I then placed the cement placed the stem and aligned the version to her native anteversion.  I helped the implant until the cement was stable.  I then trialed off this with a 36 mm head with a -3 neck.  I had good stability and nearly symmetric leg lengths.  Final cobalt chrome femoral head was placed and the hip was reduced.  Final fluoroscopic imaging was obtained.  The incision was copiously irrigated.  A gram of vancomycin  powder was placed to the incision.  The capsule was closed with a #1 Ethibond.  The TFL fascia was closed with 0 Vicryl.  The skin was closed with 2-0 Monocryl and 3-0  Monocryl.  Dermabond was used to seal the skin.  Sterile dressings were applied.  The patient was then awoke from anesthesia and taken to the PACU in stable condition.  Post Op Plan/Instructions: Patient be weightbearing as tolerated to the left lower extremity.  She will receive postoperative Ancef .  She was placed on the Plavix  and aspirin  as early as postoperative day 1.  We will have her mobilize with physical and Occupational Therapy.  I was present and performed the entire surgery.  Lauraine Moores, PA-C did assist me throughout the case. An assistant was necessary given the difficulty in approach, maintenance of reduction and ability to instrument the fracture.   Franky Light, MD [facility]

## 2024-05-08 NOTE — Interval H&P Note (Signed)
 History and Physical Interval Note:  05/08/2024 9:15 AM  Veronica Romero  has presented today for surgery, with the diagnosis of closed fracture of neck left femur.  The various methods of treatment have been discussed with the patient and family. After consideration of risks, benefits and other options for treatment, the patient has consented to  Procedures: ARTHROPLASTY, HIP, TOTAL, ANTERIOR APPROACH (Left) as a surgical intervention.  The patient's history has been reviewed, patient examined, no change in status, stable for surgery.  I have reviewed the patient's chart and labs.  Questions were answered to the patient's satisfaction.     Marilyn Nihiser P Shanita Kanan

## 2024-05-08 NOTE — Plan of Care (Signed)

## 2024-05-09 DIAGNOSIS — S72002A Fracture of unspecified part of neck of left femur, initial encounter for closed fracture: Secondary | ICD-10-CM | POA: Diagnosis not present

## 2024-05-09 LAB — GLUCOSE, CAPILLARY
Glucose-Capillary: 133 mg/dL — ABNORMAL HIGH (ref 70–99)
Glucose-Capillary: 125 mg/dL — ABNORMAL HIGH (ref 70–99)
Glucose-Capillary: 122 mg/dL — ABNORMAL HIGH (ref 70–99)
Glucose-Capillary: 108 mg/dL — ABNORMAL HIGH (ref 70–99)

## 2024-05-09 LAB — CBC
HCT: 22.2 % — ABNORMAL LOW (ref 36.0–46.0)
HCT: 25.5 % — ABNORMAL LOW (ref 36.0–46.0)
Hemoglobin: 7.6 g/dL — ABNORMAL LOW (ref 12.0–15.0)
Hemoglobin: 8.8 g/dL — ABNORMAL LOW (ref 12.0–15.0)
MCH: 33.6 pg (ref 26.0–34.0)
MCH: 32.4 pg (ref 26.0–34.0)
MCHC: 34.2 g/dL (ref 30.0–36.0)
MCHC: 34.5 g/dL (ref 30.0–36.0)
MCV: 98.2 fL (ref 80.0–100.0)
MCV: 93.8 fL (ref 80.0–100.0)
Platelets: 107 K/uL — ABNORMAL LOW (ref 150–400)
Platelets: 143 K/uL — ABNORMAL LOW (ref 150–400)
RBC: 2.26 MIL/uL — ABNORMAL LOW (ref 3.87–5.11)
RBC: 2.72 MIL/uL — ABNORMAL LOW (ref 3.87–5.11)
RDW: 15.4 % (ref 11.5–15.5)
RDW: 16.8 % — ABNORMAL HIGH (ref 11.5–15.5)
WBC: 12.7 K/uL — ABNORMAL HIGH (ref 4.0–10.5)
WBC: 14.5 K/uL — ABNORMAL HIGH (ref 4.0–10.5)
nRBC: 0 % (ref 0.0–0.2)
nRBC: 0 % (ref 0.0–0.2)

## 2024-05-09 LAB — BASIC METABOLIC PANEL WITH GFR
Anion gap: 10 (ref 5–15)
BUN: 18 mg/dL (ref 8–23)
CO2: 20 mmol/L — ABNORMAL LOW (ref 22–32)
Calcium: 8.2 mg/dL — ABNORMAL LOW (ref 8.9–10.3)
Chloride: 102 mmol/L (ref 98–111)
Creatinine, Ser: 0.76 mg/dL (ref 0.44–1.00)
GFR, Estimated: >60 mL/min
Glucose, Bld: 143 mg/dL — ABNORMAL HIGH (ref 70–99)
Potassium: 4.5 mmol/L (ref 3.5–5.1)
Sodium: 132 mmol/L — ABNORMAL LOW (ref 135–145)

## 2024-05-09 LAB — PREPARE RBC (CROSSMATCH)

## 2024-05-09 LAB — TROPONIN T, HIGH SENSITIVITY: Troponin T High Sensitivity: <15 ng/L (ref 0–19)

## 2024-05-09 MED ORDER — ASPIRIN 81 MG PO TBEC: 81.0000 mg | DELAYED_RELEASE_TABLET | Freq: Every day | ORAL | Status: DC

## 2024-05-09 MED ORDER — FUROSEMIDE 10 MG/ML IJ SOLN
20.0000 mg | Freq: Once | INTRAMUSCULAR | Status: AC
  Administered 2024-05-09: 20 mg via INTRAVENOUS
  Filled 2024-05-09: qty 2

## 2024-05-09 MED ORDER — ASPIRIN 81 MG PO TBEC
81.0000 mg | DELAYED_RELEASE_TABLET | Freq: Every day | ORAL | Status: DC
  Administered 2024-05-09 – 2024-05-12 (×4): 81 mg via ORAL
  Filled 2024-05-09 (×4): qty 1

## 2024-05-09 MED ORDER — POLYETHYLENE GLYCOL 3350 17 G PO PACK
17.0000 g | PACK | Freq: Every day | ORAL | Status: DC
  Administered 2024-05-10 – 2024-05-11 (×2): 17 g via ORAL
  Filled 2024-05-09 (×4): qty 1

## 2024-05-09 MED ORDER — SODIUM CHLORIDE 0.9% IV SOLUTION: Freq: Once | INTRAVENOUS | Status: AC

## 2024-05-09 MED ORDER — ACETAMINOPHEN 325 MG PO TABS
650.0000 mg | ORAL_TABLET | Freq: Three times a day (TID) | ORAL | Status: DC
  Administered 2024-05-09 – 2024-05-12 (×10): 650 mg via ORAL
  Filled 2024-05-09 (×10): qty 2

## 2024-05-09 NOTE — Evaluation (Signed)
 Physical Therapy Evaluation Patient Details Name: Veronica Romero MRN: [id number] DOB: Sep 11, 1950 Today's Date: 05/09/2024  History of Present Illness  74 y.o. female admitted 05/07/24 after fall sustaining L femoral neck fx. S/p L THA 1/9. PMH includes CHF, CAD, DM, HTN, ILD, ischemic cardiomyopathy, RA, pulmonary HTN, osteoporosis.  Clinical Impression  Pt presents with an overall decrease in functional mobility secondary to above. PTA, pt mod indep with intermittent use of SPC, cooks, drives, lives with daughter. Educ on precautions, positioning, therex, and importance of mobility. Today, pt able to initiate transfer and pre-gait activity with RW and modA. Pt limited by post-op pain and weakness, decreased activity tolerance and impaired balance strategies. Pt would benefit from post-acute rehab services (< 3 hrs/day) to maximize functional mobility and independence prior to d/c home.       If plan is discharge home, recommend the following: A lot of help with walking and/or transfers;A lot of help with bathing/dressing/bathroom;Assistance with cooking/housework;Assist for transportation;Help with stairs or ramp for entrance   Can travel by private vehicle   No    Equipment Recommendations  (TBD - potential RW, BSC)  Recommendations for Other Services   Occupational Therapy; Mobility Specialist   Functional Status Assessment Patient has had a recent decline in their functional status and demonstrates the ability to make significant improvements in function in a reasonable and predictable amount of time.     Precautions / Restrictions Precautions Precautions: Fall Recall of Precautions/Restrictions: Intact Restrictions Weight Bearing Restrictions Per Provider Order: Yes LLE Weight Bearing Per Provider Order: Weight bearing as tolerated      Mobility  Bed Mobility Overal bed mobility: Needs Assistance Bed Mobility: Supine to Sit     Supine to sit: Supervision, HOB elevated,  Used rails     General bed mobility comments: increased time and effort, intermittent use of UEs to assist LLE to EOB; seated rest break once upright and cues for breathing secondary to pain    Transfers Overall transfer level: Needs assistance Equipment used: Rolling walker (2 wheels) Transfers: Sit to/from Stand, Bed to chair/wheelchair/BSC Sit to Stand: Mod assist, From elevated surface   Step pivot transfers: Min assist       General transfer comment: pt able to stand on second attempt with modA for trunk elevation and stability transitioning UE support to RW, increased time and effort, frequent cues for breathing; step pivot ~2' from bed>recliner with RW and minA for stability/walker management    Ambulation/Gait Ambulation/Gait assistance: Min assist   Assistive device: Rolling walker (2 wheels)       Pre-gait activities: initial weight shifts, progressing to side steps with pt sliding R foot then able to take complete steps with each foot, decreased foot clearance; then pivotal steps to recliner. gait progression limited by pain and fatigue    Stairs            Wheelchair Mobility     Tilt Bed    Modified Rankin (Stroke Patients Only)       Balance Overall balance assessment: Needs assistance Sitting-balance support: No upper extremity supported, Feet supported Sitting balance-Leahy Scale: Fair     Standing balance support: Bilateral upper extremity supported, Reliant on assistive device for balance Standing balance-Leahy Scale: Poor                               Pertinent Vitals/Pain Pain Assessment Pain Assessment: Faces Faces Pain Scale: Hurts even more  Pain Location: L hip Pain Descriptors / Indicators: Grimacing, Guarding, Sore Pain Intervention(s): Monitored during session, Limited activity within patient's tolerance, Premedicated before session, Repositioned    Home Living Family/patient expects to be discharged to:: Private  residence Living Arrangements: Children;Other relatives Available Help at Discharge: Family;Available 24 hours/day Type of Home: House Home Access: Stairs to enter   [employer] of Steps: 1   Home Layout: Able to live on main level with bedroom/bathroom Home Equipment: Cane - single point;Toilet riser Additional Comments: recently moved in with daughter and grandson; daughter works, 88 y.o. grandson could be available for 24/7    Prior Function Prior Level of Function : Independent/Modified Independent;Driving;History of Falls (last six months)             Mobility Comments: typically mod indep with SPC outside home; was driving, but car currently not working so family drives. reports 2x falls in past 6 months ADLs Comments: mod indep ADLs; stands to shower (seat does not fit in shower); cooks for family     Extremity/Trunk Assessment   Upper Extremity Assessment Upper Extremity Assessment: RUE deficits/detail;LUE deficits/detail;Generalized weakness RUE Deficits / Details: functional observed strength >/ 3/5; h/o RA with notable ulnar deviation bilateral fingers LUE Deficits / Details: functional observed strength >/ 3/5; h/o RA with notable ulnar deviation bilateral fingers    Lower Extremity Assessment Lower Extremity Assessment: Generalized weakness;LLE deficits/detail LLE Deficits / Details: s/p L THA with expected post-op pain and weakness; gross L hip strength 2-3/5, knee 3/5    Cervical / Trunk Assessment Cervical / Trunk Assessment: Kyphotic  Communication   Communication Communication: No apparent difficulties    Cognition Arousal: Alert Behavior During Therapy: WFL for tasks assessed/performed   PT - Cognitive impairments: No apparent impairments                       PT - Cognition Comments: WFL for simple tasks, not formally assessed. distracted by pain requiring intermittent cues for sequencing Following commands: Intact        Cueing Cueing Techniques: Verbal cues     General Comments General comments (skin integrity, edema, etc.): SpO2 97% on RA, HR 109. pt's daughter present and supportive. educ pt and daughter re: role of acute PT, POC, precautions, positioning, edema control, activity recommendations, LLE AROM, importance of OOB mobility with nursing staff (i.e. use of BSC instead of bedpan), potential d/c needs    Exercises     Assessment/Plan    PT Assessment Patient needs continued PT services  PT Problem List Decreased strength;Decreased range of motion;Decreased activity tolerance;Decreased balance;Decreased mobility;Decreased knowledge of use of DME;Decreased knowledge of precautions;Pain       PT Treatment Interventions DME instruction;Gait training;Stair training;Functional mobility training;Therapeutic activities;Therapeutic exercise;Balance training;Patient/family education    PT Goals (Current goals can be found in the Care Plan section)  Acute Rehab PT Goals Patient Stated Goal: regain strength, return home PT Goal Formulation: With patient/family Time For Goal Achievement: 05/23/24 Potential to Achieve Goals: Good    Frequency Min 2X/week     Co-evaluation               AM-PAC PT 6 Clicks Mobility  Outcome Measure Help needed turning from your back to your side while in a flat bed without using bedrails?: A Little Help needed moving from lying on your back to sitting on the side of a flat bed without using bedrails?: A Little Help needed moving to and from a bed  to a chair (including a wheelchair)?: A Lot Help needed standing up from a chair using your arms (e.g., wheelchair or bedside chair)?: A Lot Help needed to walk in hospital room?: Total Help needed climbing 3-5 steps with a railing? : Total 6 Click Score: 12    End of Session Equipment Utilized During Treatment: Gait belt Activity Tolerance: Patient tolerated treatment well Patient left: in chair;with call  bell/phone within reach;with chair alarm set;with family/visitor present Nurse Communication: Mobility status PT Visit Diagnosis: Other abnormalities of gait and mobility (R26.89);Muscle weakness (generalized) (M62.81);Pain Pain - Right/Left: Left Pain - part of body: Hip    Time: 0826-0852 PT Time Calculation (min) (ACUTE ONLY): 26 min   Charges:   PT Evaluation $PT Eval Moderate Complexity: 1 Mod PT Treatments $Therapeutic Activity: 8-22 mins PT General Charges $$ ACUTE PT VISIT: 1 Visit       Darice Almas, PT, DPT Acute Rehabilitation Services  Personal: Secure Chat Rehab Office: [phone]  Darice LITTIE Almas 05/09/2024, 11:07 AM

## 2024-05-09 NOTE — Evaluation (Signed)
 Occupational Therapy Evaluation Patient Details Name: Veronica Romero MRN: [id number] DOB: [birth year] Today's Date: 05/09/2024   History of Present Illness   74 y.o. female admitted 05/07/24 after fall sustaining L femoral neck fx. S/p L THA 1/9. PMH includes CHF, CAD, DM, HTN, ILD, ischemic cardiomyopathy, RA, pulmonary HTN, osteoporosis.     Clinical Impressions Prior to this admission, patient living with her daughter and grandson, driving, and using a cane occasionally. Patient independent in ADLs and IADLs. Currently, patient with minimal L hip pain, a purple spot in her vision (vision assessed and WNL), and min A for bed mobility, transfers, and ADL management. Education provided to daughter and patient with regard for a [facility] for home use, and use of briefs as patient is having significant urination. OT recommending HHOT at discharge, will continue to follow.      If plan is discharge home, recommend the following:   A little help with walking and/or transfers;A lot of help with bathing/dressing/bathroom;Assistance with cooking/housework;Assist for transportation;Help with stairs or ramp for entrance     Functional Status Assessment   Patient has had a recent decline in their functional status and demonstrates the ability to make significant improvements in function in a reasonable and predictable amount of time.     Equipment Recommendations   BSC/3in1     Recommendations for Other Services         Precautions/Restrictions   Precautions Precautions: Fall Recall of Precautions/Restrictions: Intact Restrictions Weight Bearing Restrictions Per Provider Order: Yes LLE Weight Bearing Per Provider Order: Weight bearing as tolerated     Mobility Bed Mobility Overal bed mobility: Needs Assistance Bed Mobility: Supine to Sit, Sit to Supine     Supine to sit: Supervision, HOB elevated, Used rails Sit to supine: Min assist   General bed mobility comments: Min A  to return LLE back into bed, minimal increased time to transition to EOB    Transfers Overall transfer level: Needs assistance Equipment used: Rolling walker (2 wheels) Transfers: Sit to/from Stand Sit to Stand: Min assist, From elevated surface           General transfer comment: Min A from elevated surface, but hospital bed not placed as high as bed at home, able to take a few steps towards the [facility] with min A for safety, though no physical assist needed      Balance Overall balance assessment: Needs assistance Sitting-balance support: No upper extremity supported, Feet supported Sitting balance-Leahy Scale: Fair     Standing balance support: Bilateral upper extremity supported, Reliant on assistive device for balance Standing balance-Leahy Scale: Poor Standing balance comment: reliant on RW                           ADL either performed or assessed with clinical judgement   ADL Overall ADL's : Needs assistance/impaired Eating/Feeding: Set up;Sitting   Grooming: Set up;Sitting   Upper Body Bathing: Set up;Sitting   Lower Body Bathing: Moderate assistance;Sit to/from stand;Sitting/lateral leans   Upper Body Dressing : Set up;Sitting   Lower Body Dressing: Moderate assistance;Sitting/lateral leans;Sit to/from stand   Toilet Transfer: Minimal assistance;Stand-pivot;Ambulation;BSC/3in1   Toileting- Clothing Manipulation and Hygiene: Contact guard assist;Sit to/from stand;Sitting/lateral lean       Functional mobility during ADLs: Minimal assistance;Cueing for sequencing;Rolling walker (2 wheels);Cueing for safety General ADL Comments: Prior to this admission, patient living with her daughter and grandson, driving, and using a cane occasionally. Patient independent in ADLs  and IADLs. Currently, patient with minimal L hip pain, a purple spot in her vision, and min A for bed mobility, transfers, and ADL management. Education provided to daughter and patient  with regard for a [facility] for home use, and use of briefs as patient is having significant urination. OT recommending HHOT at discharge, will continue to follow.     Vision Baseline Vision/History: 0 No visual deficits Ability to See in Adequate Light: 0 Adequate Patient Visual Report: Other (comment) (Purple spot in vision) Vision Assessment?: Yes Eye Alignment: Within Functional Limits Ocular Range of Motion: Within Functional Limits Alignment/Gaze Preference: Within Defined Limits Tracking/Visual Pursuits: Able to track stimulus in all quads without difficulty Saccades: Within functional limits Convergence: Within functional limits Visual Fields: No apparent deficits Additional Comments: Occasional purple spot in vision when looking at TV or in bright light in L eye, has bruise over L eyebrow, is able to read and manage visually without difficulty, will continue to monitor     Perception Perception: Not tested       Praxis Praxis: Not tested       Pertinent Vitals/Pain Pain Assessment Pain Assessment: 0-10 Pain Score: 2  Pain Location: L hip Pain Descriptors / Indicators: Grimacing, Guarding, Sore Pain Intervention(s): Limited activity within patient's tolerance, Monitored during session, Repositioned     Extremity/Trunk Assessment Upper Extremity Assessment Upper Extremity Assessment: LUE deficits/detail;RUE deficits/detail RUE Deficits / Details: significant ulnar drift and deformites due to RA, strength 3/5 RUE Sensation: WNL RUE Coordination: decreased fine motor LUE Deficits / Details: significant ulnar drift and deformites due to RA, strength 3/5 LUE Sensation: WNL LUE Coordination: decreased fine motor   Lower Extremity Assessment Lower Extremity Assessment: Defer to PT evaluation   Cervical / Trunk Assessment Cervical / Trunk Assessment: Kyphotic   Communication Communication Communication: No apparent difficulties   Cognition Arousal: Alert Behavior During  Therapy: WFL for tasks assessed/performed Cognition: No apparent impairments                               Following commands: Intact       Cueing  General Comments   Cueing Techniques: Verbal cues  VSS on RA   Exercises     Shoulder Instructions      Home Living Family/patient expects to be discharged to:: Private residence Living Arrangements: Children;Other relatives Available Help at Discharge: Family;Available 24 hours/day Type of Home: House Home Access: Stairs to enter [employer] of Steps: 1   Home Layout: Able to live on main level with bedroom/bathroom     Bathroom Shower/Tub: Producer, Television/film/video: Handicapped height Bathroom Accessibility: Yes   Home Equipment: Cane - single point;Toilet riser;Other (comment) (Adjustable bed)   Additional Comments: recently moved in with daughter and grandson; daughter works, 74 y.o. grandson could be available for 24/7      Prior Functioning/Environment Prior Level of Function : Independent/Modified Independent;Driving;History of Falls (last six months)             Mobility Comments: typically mod indep with SPC outside home; was driving, but car currently not working so family drives. reports 2x falls in past 6 months ADLs Comments: mod indep ADLs; stands to shower (seat does not fit in shower); cooks for family    OT Problem List: Decreased strength;Decreased range of motion;Decreased activity tolerance;Decreased coordination;Decreased safety awareness;Decreased knowledge of precautions;Pain;Increased edema   OT Treatment/Interventions: Self-care/ADL training;Therapeutic exercise;Energy conservation;DME and/or AE instruction;Manual  therapy;Therapeutic activities;Patient/family education;Balance training      OT Goals(Current goals can be found in the care plan section)   Acute Rehab OT Goals Patient Stated Goal: to get better OT Goal Formulation: With patient/family Time  For Goal Achievement: 05/23/24 Potential to Achieve Goals: Good ADL Goals Pt Will Perform Lower Body Bathing: with modified independence;sit to/from stand;sitting/lateral leans Pt Will Perform Lower Body Dressing: with modified independence;sitting/lateral leans;sit to/from stand Pt Will Transfer to Toilet: with modified independence;ambulating;regular height toilet Pt Will Perform Toileting - Clothing Manipulation and hygiene: with modified independence;sitting/lateral leans;sit to/from stand Additional ADL Goal #1: Patient will be able to complete bed mobility at [facility] as a precursor to OOB activity. Additional ADL Goal #2: Patient will be able to complete functional task in standing for 3 - 5 minutes prior to needing seated rest break in order to increase overall activity tolerance.   OT Frequency:  Min 2X/week    Co-evaluation              AM-PAC OT 6 Clicks Daily Activity     Outcome Measure Help from another person eating meals?: A Little Help from another person taking care of personal grooming?: A Little Help from another person toileting, which includes using toliet, bedpan, or urinal?: A Little Help from another person bathing (including washing, rinsing, drying)?: A Lot Help from another person to put on and taking off regular upper body clothing?: A Little Help from another person to put on and taking off regular lower body clothing?: A Lot 6 Click Score: 16   End of Session Equipment Utilized During Treatment: Rolling walker (2 wheels);Gait belt Nurse Communication: Mobility status  Activity Tolerance: Patient tolerated treatment well Patient left: in bed;with call bell/phone within reach;with bed alarm set;with family/visitor present  OT Visit Diagnosis: Unsteadiness on feet (R26.81);Other abnormalities of gait and mobility (R26.89);Muscle weakness (generalized) (M62.81);History of falling (Z91.81);Pain Pain - Right/Left: Left Pain - part of body: Hip                 Time: 8548-8475 OT Time Calculation (min): 33 min Charges:  OT General Charges $OT Visit: 1 Visit OT Evaluation $OT Eval Moderate Complexity: 1 Mod OT Treatments $Self Care/Home Management : 8-22 mins  Ronal Gift E. Toyia Jelinek, OTR/L Acute Rehabilitation Services [phone]   Ronal Gift Salt 05/09/2024, 3:43 PM

## 2024-05-09 NOTE — Progress Notes (Signed)
 "    Subjective: 1 Day Post-Op s/p Procedures: ARTHROPLASTY, HIP, TOTAL, ANTERIOR APPROACH   Patient is alert, oriented. Sitting up in bed in no acute distress. Pain well controlled at this time. Denies chest pain, SOB. No nausea/vomiting.  No other complaints. Patient has been dealing with anemia at baseline and has been receiving infusions.    Objective:  PE: VITALS:   Vitals:   05/08/24 1430 05/08/24 2033 05/09/24 0425 05/09/24 0750  BP: (!) 100/47 (!) 98/45 (!) 113/54 (!) 117/58  Pulse: 91 97 (!) 102 (!) 102  Resp: 16 16 15 18   Temp: 98.1 F (36.7 C) 99 F (37.2 C) 99 F (37.2 C) 98.9 F (37.2 C)  TempSrc:  Oral Oral   SpO2: 97% (!) 86% 93% 93%  Weight:      Height:       Sitting up in bed, in no acute distress Resp: no increased work of breathing MSK:  Sensation intact distally Intact pulses distally Dorsiflexion/Plantar flexion intact Incision: dressing C/D/I  LABS  Results for orders placed or performed during the hospital encounter of 05/07/24 (from the past 24 hours)  Troponin T, High Sensitivity     Status: None   Collection Time: 05/08/24  8:19 AM  Result Value Ref Range   Troponin T High Sensitivity <15 0 - 19 ng/L  Magnesium      Status: None   Collection Time: 05/08/24  8:19 AM  Result Value Ref Range   Magnesium  2.0 1.7 - 2.4 mg/dL  Glucose, capillary     Status: Abnormal   Collection Time: 05/08/24  9:02 AM  Result Value Ref Range   Glucose-Capillary 103 (H) 70 - 99 mg/dL  Glucose, capillary     Status: Abnormal   Collection Time: 05/08/24  1:18 PM  Result Value Ref Range   Glucose-Capillary 146 (H) 70 - 99 mg/dL  Glucose, capillary     Status: Abnormal   Collection Time: 05/08/24  4:59 PM  Result Value Ref Range   Glucose-Capillary 201 (H) 70 - 99 mg/dL  Glucose, capillary     Status: Abnormal   Collection Time: 05/08/24 10:52 PM  Result Value Ref Range   Glucose-Capillary 183 (H) 70 - 99 mg/dL  CBC     Status: Abnormal   Collection  Time: 05/09/24  5:42 AM  Result Value Ref Range   WBC 12.7 (H) 4.0 - 10.5 K/uL   RBC 2.26 (L) 3.87 - 5.11 MIL/uL   Hemoglobin 7.6 (L) 12.0 - 15.0 g/dL   HCT 77.7 (L) 63.9 - 53.9 %   MCV 98.2 80.0 - 100.0 fL   MCH 33.6 26.0 - 34.0 pg   MCHC 34.2 30.0 - 36.0 g/dL   RDW 84.5 88.4 - 84.4 %   Platelets 107 (L) 150 - 400 K/uL   nRBC 0.0 0.0 - 0.2 %  Basic metabolic panel     Status: Abnormal   Collection Time: 05/09/24  5:42 AM  Result Value Ref Range   Sodium 132 (L) 135 - 145 mmol/L   Potassium 4.5 3.5 - 5.1 mmol/L   Chloride 102 98 - 111 mmol/L   CO2 20 (L) 22 - 32 mmol/L   Glucose, Bld 143 (H) 70 - 99 mg/dL   BUN 18 8 - 23 mg/dL   Creatinine, Ser 9.23 0.44 - 1.00 mg/dL   Calcium  8.2 (L) 8.9 - 10.3 mg/dL   GFR, Estimated >39 >39 mL/min   Anion gap 10 5 - 15  Glucose, capillary  Status: Abnormal   Collection Time: 05/09/24  6:52 AM  Result Value Ref Range   Glucose-Capillary 133 (H) 70 - 99 mg/dL    DG HIP UNILAT W OR W/O PELVIS 2-3 VIEWS LEFT Result Date: 05/08/2024 CLINICAL DATA:  Status post left hip arthroplasty. EXAM: DG HIP (WITH OR WITHOUT PELVIS) 2-3V LEFT COMPARISON:  Preoperative imaging FINDINGS: Left hip arthroplasty in expected alignment. No periprosthetic lucency or fracture. Recent postsurgical change includes air and edema in the soft tissues. IMPRESSION: Left hip arthroplasty without immediate postoperative complication. Electronically Signed   By: Andrea Gasman M.D.   On: 05/08/2024 15:46   DG HIP UNILAT WITH PELVIS 1V LEFT Result Date: 05/08/2024 CLINICAL DATA:  Elective surgery EXAM: DG HIP (WITH OR WITHOUT PELVIS) 1V*L* COMPARISON:  Radiograph yesterday FINDINGS: Ten fluoroscopic spot views of the pelvis and left hip obtained in the operating room. Sequential images during hip arthroplasty. Fluoroscopy time 26.4 seconds. Dose 2.0657 mGy. IMPRESSION: Intraoperative fluoroscopy during left hip arthroplasty. Electronically Signed   By: Andrea Gasman M.D.   On:  05/08/2024 15:45   DG C-Arm 1-60 Min-No Report Result Date: 05/08/2024 Fluoroscopy was utilized by the requesting physician.  No radiographic interpretation.   DG C-Arm 1-60 Min-No Report Result Date: 05/08/2024 Fluoroscopy was utilized by the requesting physician.  No radiographic interpretation.   DG C-Arm 1-60 Min-No Report Result Date: 05/08/2024 Fluoroscopy was utilized by the requesting physician.  No radiographic interpretation.   DG Chest 2 View Result Date: 05/07/2024 CLINICAL DATA:  Status post fall. EXAM: CHEST - 2 VIEW COMPARISON:  January 07, 2024 FINDINGS: The cardiac silhouette is borderline in size and unchanged in appearance. Chronic appearing increased lung markings are seen bilaterally with mild atelectasis and/or infiltrate noted within the left lung base. No pleural effusion or pneumothorax is identified. There is a small to moderate sized hiatal hernia. No acute osseous abnormalities are identified. IMPRESSION: 1. Chronic appearing increased lung markings with mild left basilar atelectasis and/or infiltrate. 2. Small to moderate sized hiatal hernia. Electronically Signed   By: Suzen Dials M.D.   On: 05/07/2024 16:04   CT Cervical Spine Wo Contrast Result Date: 05/07/2024 CLINICAL DATA:  Fall with head/neck trauma. EXAM: CT HEAD WITHOUT CONTRAST CT CERVICAL SPINE WITHOUT CONTRAST TECHNIQUE: Multidetector CT imaging of the head and cervical spine was performed following the standard protocol without intravenous contrast. Multiplanar CT image reconstructions of the cervical spine were also generated. RADIATION DOSE REDUCTION: This exam was performed according to the departmental dose-optimization program which includes automated exposure control, adjustment of the mA and/or kV according to patient size and/or use of iterative reconstruction technique. COMPARISON:  None Available. FINDINGS: CT HEAD FINDINGS Brain: Ventricles, cisterns and other CSF spaces are normal. There is no  mass, mass effect, shift of midline structures or acute hemorrhage. Minimal chronic ischemic microvascular disease is present. No acute infarction. Vascular: No hyperdense vessel or unexpected calcification. Skull: Normal. Negative for fracture or focal lesion. Sinuses/Orbits: Orbits are normal. Paranasal sinuses are well developed. There is opacification over the right maxillary sinus. Other: None. CT CERVICAL SPINE FINDINGS Alignment: Normal. Skull base and vertebrae: Mild spondylosis throughout the cervical spine to include uncovertebral joint spurring and facet arthropathy. Vertebral body heights are maintained. No acute fracture. Right-sided neural foraminal narrowing at the C3-4 level. Minimal left-sided neural from narrowing at the C4-5 level. Mild right-sided neural foraminal narrowing at the C5-6 level. Soft tissues and spinal canal: Prevertebral soft tissues are normal. No canal stenosis. Disc levels:  Minimal disc space narrowing at the C5-6 level. Upper chest: No acute findings. Other: None. IMPRESSION: 1. No acute brain injury. 2. Minimal chronic ischemic microvascular disease. 3. No acute cervical spine injury. 4. Mild spondylosis of the cervical spine with minimal disc disease at the C5-6 level. 5. Right maxillary sinus inflammatory disease. Electronically Signed   By: Toribio Agreste M.D.   On: 05/07/2024 15:57   DG Hip Unilat W or Wo Pelvis 2-3 Views Left Result Date: 05/07/2024 EXAM: 2 or more VIEW(S) XRAY OF THE UNILATERAL HIP 05/07/2024 02:48:00 PM COMPARISON: None available. CLINICAL HISTORY: fall pain FINDINGS: BONES AND JOINTS: Subcapital left femoral neck fracture with varus angulation and shortening of the femoral neck. SOFT TISSUES: Vascular calcifications in pelvis. IMPRESSION: 1. Subcapital left femoral neck fracture with varus angulation and shortening of the femoral neck. Electronically signed by: Norleen Boxer MD MD 05/07/2024 03:32 PM EST RP Workstation: [id number]    Today's  total  administered Morphine  Milligram Equivalents: 0 Yesterday's total administered Morphine  Milligram Equivalents: 187  Assessment/Plan: Principal Problem:   Hip fracture (HCC)  1 Day Post-Op s/p Procedures: ARTHROPLASTY, HIP, TOTAL, ANTERIOR APPROACH  ABLA: Hbg 11.6 pre-op, down to 7.6 this morning. Will continue to watch for possible transfusion need.   Weightbearing: WBAT LLE Insicional and dressing care: Reinforce dressings as needed VTE prophylaxis: plan is for plavix  and aspirin  post op, will continue on her 81 mg daily aspirin , holding plavix  for now due to Hbg drop Pain control: continue current regimen Follow - up plan: w/ Dr. Kendal Dispo: pending PT/OT evals   Contact information:   Army Daring, PA-C Weekdays 8-5  After hours and holidays please check [email or web address] for group call information for [facility]  Army MARLA Daring 05/09/2024, 8:13 AM "

## 2024-05-09 NOTE — Progress Notes (Signed)
 " PROGRESS NOTE    Veronica Romero  FMW:[id number] DOB: 27-Jun-1950 DOA: 05/07/2024 PCP: Melvin Pao, NP   Brief Narrative: Veronica Romero is a 74 y.o. female with medical history significant of ischemic cardiomyopathy last ejection fraction 45 to 50% 05/07/2023, diabetes, ILD, hypertension, CAD, pulmonary hypertension, rheumatoid arthritis, who presented after a mechanical fall. Patient was found to have Subcapital left femoral neck fracture with varus angulation and shortening of the femoral neck .      Assessment & Plan:   Principal Problem:   Hip fracture (HCC)  1-Subcapital left femoral neck fracture; - Patient presented after a mechanical fall found to have femoral neck fracture. - Orthopedic consulted. - EKG: and pro-BNP: 365, no evidence of HF exacerbation.  -Continue with laxatives.  -Patient underwent left total hip arthroplasty of the left femoral neck fracture on 1/9 by Dr. Kendal.   Acute blood loss anemia; post op , expected.  She does complaining  of dizziness and dyspnea on exertion.  Hb down to 7.6 from 11---plan to proceed with one unit of Blood due to symptoms and history Heart diseases.  Lasix  after blood transfusion.   Interstitial lung disease -Chest x-ray: Chronic appearing increased lung markings with mild left basilar atelectasis and/or infiltrate. - Not on home oxygen - No appear to be on any inhaler at home -PRN nebulizers   History of ischemic cardiomyopathy with improved ejection fraction - History of OCT guided complex atherectomy to ostial mid LAD with DES 3.0 x 30 mm in 06/2021.  She has residual 50% stenosis in the mid RCA and distal LCx. - Patient denies chest pain, she is able to ambulate several blocks without getting shortness of breath or chest pain. -EKG: sinus rhythm, similar to EKG from 01/07/2024  and proBNP 360 - Will hold Plavix  prior to surgery, resume as possible postsurgery. -Continue with aspirin .  Troponin negative this am I  informed Dr Krystal of patient admission.   Diabetes type 2 - Not on medication.  Monitor CBG   Metabolic acidosis; Resolved.    Rheumatoid arthritis - On Humira and low-dose prednisone  -Continue chronic dose of prednisone  5 mg daily   Iron deficiency anemia: Continue folic acid  and B12 supplements.  Monitor hemoglobin   Leukocytosis: Suspect demargination.  Stress.  Resolved.      Estimated body mass index is 25.06 kg/m (pended) as calculated from the following:   Height as of this encounter: (P) 5' 7 (1.702 m).   Weight as of this encounter: (P) 72.6 kg.   DVT prophylaxis: Lovenox  Code Status: Full code Family Communication:Daughter at bedside Disposition Plan:  Status is: Inpatient Remains inpatient appropriate because: management of  hip fracture.     Consultants:  Ortho Cardiology  Procedures:  Patient underwent left total hip arthroplasty of the left femoral neck fracture on 1/9  Antimicrobials:    Subjective: Patient denies chest pain. Report some SOB on exertion while getting to recliner with PT today. Also report dizziness while working with PT   Objective: Vitals:   05/08/24 1430 05/08/24 2033 05/09/24 0425 05/09/24 0750  BP: (!) 100/47 (!) 98/45 (!) 113/54 (!) 117/58  Pulse: 91 97 (!) 102 (!) 102  Resp: 16 16 15 18   Temp: 98.1 F (36.7 C) 99 F (37.2 C) 99 F (37.2 C) 98.9 F (37.2 C)  TempSrc:  Oral Oral   SpO2: 97% (!) 86% 93% 93%  Weight:      Height:        Intake/Output  Summary (Last 24 hours) at 05/09/2024 1051 Last data filed at 05/09/2024 0626 Gross per 24 hour  Intake 3132.5 ml  Output 2400 ml  Net 732.5 ml   Filed Weights   05/07/24 1350 05/08/24 0903  Weight: 72.6 kg (P) 72.6 kg    Examination:  General exam: NAD Respiratory system: CTA Cardiovascular system: S 1, S 2 RRR Gastrointestinal system: BS present, soft, nt Central nervous system: alert, conversant  Extremities: clean dressing left LE  Data Reviewed: I  have personally reviewed following labs and imaging studies  CBC: Recent Labs  Lab 05/07/24 1510 05/08/24 0554 05/09/24 0542  WBC 14.5* 10.2 12.7*  NEUTROABS 10.5*  --   --   HGB 12.2 11.6* 7.6*  HCT 37.0 33.6* 22.2*  MCV 99.7 94.9 98.2  PLT 229 207 107*   Basic Metabolic Panel: Recent Labs  Lab 05/07/24 1510 05/07/24 2021 05/08/24 0554 05/08/24 0819 05/09/24 0542  NA 137  --  136  --  132*  K 4.1  --  3.8  --  4.5  CL 104  --  102  --  102  CO2 17*  --  24  --  20*  GLUCOSE 100*  --  120*  --  143*  BUN 23  --  14  --  18  CREATININE 0.73 0.73 0.74  --  0.76  CALCIUM  9.0  --  8.9  --  8.2*  MG  --   --   --  2.0  --    GFR: Estimated Creatinine Clearance: 59.1 mL/min (by C-G formula based on SCr of 0.76 mg/dL). Liver Function Tests: No results for input(s): AST, ALT, ALKPHOS, BILITOT, PROT, ALBUMIN  in the last 168 hours. No results for input(s): LIPASE, AMYLASE in the last 168 hours. No results for input(s): AMMONIA in the last 168 hours. Coagulation Profile: No results for input(s): INR, PROTIME in the last 168 hours. Cardiac Enzymes: No results for input(s): CKTOTAL, CKMB, CKMBINDEX, TROPONINI in the last 168 hours. BNP (last 3 results) Recent Labs    05/07/24 2021  PROBNP 365.0*   HbA1C: No results for input(s): HGBA1C in the last 72 hours. CBG: Recent Labs  Lab 05/08/24 0902 05/08/24 1318 05/08/24 1659 05/08/24 2252 05/09/24 0652  GLUCAP 103* 146* 201* 183* 133*   Lipid Profile: No results for input(s): CHOL, HDL, LDLCALC, TRIG, CHOLHDL, LDLDIRECT in the last 72 hours. Thyroid Function Tests: No results for input(s): TSH, T4TOTAL, FREET4, T3FREE, THYROIDAB in the last 72 hours. Anemia Panel: No results for input(s): VITAMINB12, FOLATE, FERRITIN, TIBC, IRON, RETICCTPCT in the last 72 hours. Sepsis Labs: No results for input(s): PROCALCITON, LATICACIDVEN in the last 168  hours.  Recent Results (from the past 240 hours)  Surgical PCR screen     Status: None   Collection Time: 05/07/24  8:31 PM   Specimen: Nasal Mucosa; Nasal Swab  Result Value Ref Range Status   MRSA, PCR NEGATIVE NEGATIVE Final   Staphylococcus aureus NEGATIVE NEGATIVE Final    Comment: (NOTE) The Xpert SA Assay (FDA approved for NASAL specimens in patients 76 years of age and older), is one component of a comprehensive surveillance program. It is not intended to diagnose infection nor to guide or monitor treatment. Performed at [facility], [address], [place], [place] [address]          Radiology Studies: DG HIP UNILAT W OR W/O PELVIS 2-3 VIEWS LEFT Result Date: 05/08/2024 CLINICAL DATA:  Status post left hip  arthroplasty. EXAM: DG HIP (WITH OR WITHOUT PELVIS) 2-3V LEFT COMPARISON:  Preoperative imaging FINDINGS: Left hip arthroplasty in expected alignment. No periprosthetic lucency or fracture. Recent postsurgical change includes air and edema in the soft tissues. IMPRESSION: Left hip arthroplasty without immediate postoperative complication. Electronically Signed   By: Andrea Gasman M.D.   On: 05/08/2024 15:46   DG HIP UNILAT WITH PELVIS 1V LEFT Result Date: 05/08/2024 CLINICAL DATA:  Elective surgery EXAM: DG HIP (WITH OR WITHOUT PELVIS) 1V*L* COMPARISON:  Radiograph yesterday FINDINGS: Ten fluoroscopic spot views of the pelvis and left hip obtained in the operating room. Sequential images during hip arthroplasty. Fluoroscopy time 26.4 seconds. Dose 2.0657 mGy. IMPRESSION: Intraoperative fluoroscopy during left hip arthroplasty. Electronically Signed   By: Andrea Gasman M.D.   On: 05/08/2024 15:45   DG C-Arm 1-60 Min-No Report Result Date: 05/08/2024 Fluoroscopy was utilized by the requesting physician.  No radiographic interpretation.   DG C-Arm 1-60 Min-No Report Result Date: 05/08/2024 Fluoroscopy was utilized by the requesting physician.  No radiographic  interpretation.   DG C-Arm 1-60 Min-No Report Result Date: 05/08/2024 Fluoroscopy was utilized by the requesting physician.  No radiographic interpretation.   DG Chest 2 View Result Date: 05/07/2024 CLINICAL DATA:  Status post fall. EXAM: CHEST - 2 VIEW COMPARISON:  January 07, 2024 FINDINGS: The cardiac silhouette is borderline in size and unchanged in appearance. Chronic appearing increased lung markings are seen bilaterally with mild atelectasis and/or infiltrate noted within the left lung base. No pleural effusion or pneumothorax is identified. There is a small to moderate sized hiatal hernia. No acute osseous abnormalities are identified. IMPRESSION: 1. Chronic appearing increased lung markings with mild left basilar atelectasis and/or infiltrate. 2. Small to moderate sized hiatal hernia. Electronically Signed   By: Suzen Dials M.D.   On: 05/07/2024 16:04   CT Cervical Spine Wo Contrast Result Date: 05/07/2024 CLINICAL DATA:  Fall with head/neck trauma. EXAM: CT HEAD WITHOUT CONTRAST CT CERVICAL SPINE WITHOUT CONTRAST TECHNIQUE: Multidetector CT imaging of the head and cervical spine was performed following the standard protocol without intravenous contrast. Multiplanar CT image reconstructions of the cervical spine were also generated. RADIATION DOSE REDUCTION: This exam was performed according to the departmental dose-optimization program which includes automated exposure control, adjustment of the mA and/or kV according to patient size and/or use of iterative reconstruction technique. COMPARISON:  None Available. FINDINGS: CT HEAD FINDINGS Brain: Ventricles, cisterns and other CSF spaces are normal. There is no mass, mass effect, shift of midline structures or acute hemorrhage. Minimal chronic ischemic microvascular disease is present. No acute infarction. Vascular: No hyperdense vessel or unexpected calcification. Skull: Normal. Negative for fracture or focal lesion. Sinuses/Orbits: Orbits are  normal. Paranasal sinuses are well developed. There is opacification over the right maxillary sinus. Other: None. CT CERVICAL SPINE FINDINGS Alignment: Normal. Skull base and vertebrae: Mild spondylosis throughout the cervical spine to include uncovertebral joint spurring and facet arthropathy. Vertebral body heights are maintained. No acute fracture. Right-sided neural foraminal narrowing at the C3-4 level. Minimal left-sided neural from narrowing at the C4-5 level. Mild right-sided neural foraminal narrowing at the C5-6 level. Soft tissues and spinal canal: Prevertebral soft tissues are normal. No canal stenosis. Disc levels:  Minimal disc space narrowing at the C5-6 level. Upper chest: No acute findings. Other: None. IMPRESSION: 1. No acute brain injury. 2. Minimal chronic ischemic microvascular disease. 3. No acute cervical spine injury. 4. Mild spondylosis of the cervical spine with minimal disc disease at the  C5-6 level. 5. Right maxillary sinus inflammatory disease. Electronically Signed   By: Toribio Agreste M.D.   On: 05/07/2024 15:57   DG Hip Unilat W or Wo Pelvis 2-3 Views Left Result Date: 05/07/2024 EXAM: 2 or more VIEW(S) XRAY OF THE UNILATERAL HIP 05/07/2024 02:48:00 PM COMPARISON: None available. CLINICAL HISTORY: fall pain FINDINGS: BONES AND JOINTS: Subcapital left femoral neck fracture with varus angulation and shortening of the femoral neck. SOFT TISSUES: Vascular calcifications in pelvis. IMPRESSION: 1. Subcapital left femoral neck fracture with varus angulation and shortening of the femoral neck. Electronically signed by: Norleen Boxer MD MD 05/07/2024 03:32 PM EST RP Workstation: [id number]        Scheduled Meds:  sodium chloride    Intravenous Once   acetaminophen   650 mg Oral TID   aspirin  EC  81 mg Oral Daily   cyanocobalamin   1,000 mcg Oral Daily   docusate sodium   100 mg Oral BID   feeding supplement (GLUCERNA SHAKE)  237 mL Oral TID BM   folic acid   1,000 mcg Oral Daily    furosemide   20 mg Intravenous Once   multivitamin with minerals  1 tablet Oral Daily   mupirocin  ointment  1 Application Nasal BID   pantoprazole   40 mg Oral Daily   polyethylene glycol  17 g Oral Daily   predniSONE   5 mg Oral q AM   rosuvastatin   40 mg Oral Daily   senna  1 tablet Oral BID   Continuous Infusions:   LOS: 2 days    Time spent: 35 minutes    Roey Coopman A Tien Spooner, MD [facility]   If 7PM-7AM, please contact night-coverage [email or web address]  05/09/2024, 10:51 AM   "

## 2024-05-10 DIAGNOSIS — S72002A Fracture of unspecified part of neck of left femur, initial encounter for closed fracture: Secondary | ICD-10-CM | POA: Diagnosis not present

## 2024-05-10 LAB — CBC
HCT: 24.1 % — ABNORMAL LOW (ref 36.0–46.0)
Hemoglobin: 8.6 g/dL — ABNORMAL LOW (ref 12.0–15.0)
MCH: 33.3 pg (ref 26.0–34.0)
MCHC: 35.7 g/dL (ref 30.0–36.0)
MCV: 93.4 fL (ref 80.0–100.0)
Platelets: 149 K/uL — ABNORMAL LOW (ref 150–400)
RBC: 2.58 MIL/uL — ABNORMAL LOW (ref 3.87–5.11)
RDW: 17.4 % — ABNORMAL HIGH (ref 11.5–15.5)
WBC: 14.2 K/uL — ABNORMAL HIGH (ref 4.0–10.5)
nRBC: 0 % (ref 0.0–0.2)

## 2024-05-10 LAB — BASIC METABOLIC PANEL WITH GFR
Anion gap: 8 (ref 5–15)
BUN: 20 mg/dL (ref 8–23)
CO2: 27 mmol/L (ref 22–32)
Calcium: 8.3 mg/dL — ABNORMAL LOW (ref 8.9–10.3)
Chloride: 103 mmol/L (ref 98–111)
Creatinine, Ser: 0.85 mg/dL (ref 0.44–1.00)
GFR, Estimated: >60 mL/min
Glucose, Bld: 94 mg/dL (ref 70–99)
Potassium: 3.7 mmol/L (ref 3.5–5.1)
Sodium: 138 mmol/L (ref 135–145)

## 2024-05-10 LAB — GLUCOSE, CAPILLARY
Glucose-Capillary: 96 mg/dL (ref 70–99)
Glucose-Capillary: 203 mg/dL — ABNORMAL HIGH (ref 70–99)
Glucose-Capillary: 100 mg/dL — ABNORMAL HIGH (ref 70–99)
Glucose-Capillary: 106 mg/dL — ABNORMAL HIGH (ref 70–99)

## 2024-05-10 MED ORDER — TRAZODONE HCL 50 MG PO TABS
25.0000 mg | ORAL_TABLET | Freq: Every evening | ORAL | Status: DC | PRN
  Administered 2024-05-11 (×2): 25 mg via ORAL
  Filled 2024-05-10 (×2): qty 1

## 2024-05-10 MED ORDER — DIPHENHYDRAMINE HCL 25 MG PO CAPS: 25.0000 mg | ORAL_CAPSULE | Freq: Every evening | ORAL | Status: DC | PRN

## 2024-05-10 NOTE — Plan of Care (Signed)
   Problem: Education: Goal: Knowledge of General Education information will improve Description: Including pain rating scale, medication(s)/side effects and non-pharmacologic comfort measures Outcome: Progressing   Problem: Health Behavior/Discharge Planning: Goal: Ability to manage health-related needs will improve Outcome: Progressing   Problem: Activity: Goal: Risk for activity intolerance will decrease Outcome: Progressing

## 2024-05-10 NOTE — Progress Notes (Signed)
 " PROGRESS NOTE    Veronica Romero  FMW:[id number] DOB: [birth year] DOA: 05/07/2024 PCP: Melvin Pao, NP   Brief Narrative: Veronica Romero is a 74 y.o. female with medical history significant of ischemic cardiomyopathy last ejection fraction 45 to 50% 05/07/2023, diabetes, ILD, hypertension, CAD, pulmonary hypertension, rheumatoid arthritis, who presented after a mechanical fall. Patient was found to have Subcapital left femoral neck fracture with varus angulation and shortening of the femoral neck .      Assessment & Plan:   Principal Problem:   Hip fracture (HCC)  1-Subcapital left femoral neck fracture; - Patient presented after a mechanical fall found to have femoral neck fracture. - Orthopedic consulted. - EKG: and pro-BNP: 365, no evidence of HF exacerbation.  -Continue with laxatives.  -Patient underwent left total hip arthroplasty of the left femoral neck fracture on 1/9 by Dr. Kendal.  PT follow up  Acute blood loss anemia; post op , expected.  She does complaining  of dizziness and dyspnea on exertion.  Hb down to 7.6 from 11---Received  one unit of Blood due to symptoms and history Heart diseases.  Hb post transfusion increased to 8.6 Monitor.    Interstitial lung disease -Chest x-ray: Chronic appearing increased lung markings with mild left basilar atelectasis and/or infiltrate. - Not on home oxygen - No appear to be on any inhaler at home -PRN nebulizers   History of ischemic cardiomyopathy with improved ejection fraction - History of OCT guided complex atherectomy to ostial mid LAD with DES 3.0 x 30 mm in 06/2021.  She has residual 50% stenosis in the mid RCA and distal LCx. - Patient denies chest pain, she is able to ambulate several blocks without getting shortness of breath or chest pain. -EKG: sinus rhythm, similar to EKG from 01/07/2024  and proBNP 360 -Continue with aspirin .  Troponin negative this am I informed Dr Krystal of patient admission.  Per otho  plan to resume plavix  1/12 if hb remain stable.   Diabetes type 2 - Not on medication.  Monitor CBG   Metabolic acidosis; Resolved.    Rheumatoid arthritis - On Humira and low-dose prednisone  -Continue chronic dose of prednisone  5 mg daily   Iron deficiency anemia: Continue folic acid  and B12 supplements.  Monitor hemoglobin   Leukocytosis: Suspect demargination.  Stress.  Increased again, denies cough, dysuria. Monitor.    Insomnia. PRN Trazodone  ordered.   Estimated body mass index is 25.06 kg/m (pended) as calculated from the following:   Height as of this encounter: (P) 5' 7 (1.702 m).   Weight as of this encounter: (P) 72.6 kg.   DVT prophylaxis: Lovenox  Code Status: Full code Family Communication:Daughter at bedside Disposition Plan:  Status is: Inpatient Remains inpatient appropriate because: management of  hip fracture.     Consultants:  Ortho Cardiology  Procedures:  Patient underwent left total hip arthroplasty of the left femoral neck fracture on 1/9  Antimicrobials:    Subjective: She could not sleep last night, is difficult for her to sleep at home as well. She denies chest pain or worsening shortness of breath. No bowel movement yet, passing some gas. Has mild redness near the dressing, probably related to ice pack burn  Objective: Vitals:   05/09/24 1454 05/09/24 2012 05/10/24 0414 05/10/24 0739  BP: (!) 116/43 (!) 116/48 (!) 114/48 (!) 116/41  Pulse: 89 92 91 87  Resp: 18 18 17    Temp: (!) 97.4 F (36.3 C) 98.1 F (36.7 C) 98.6 F (37  C) 98.4 F (36.9 C)  TempSrc:  Oral Oral Oral  SpO2: 98% 93% 94% 96%  Weight:      Height:        Intake/Output Summary (Last 24 hours) at 05/10/2024 0823 Last data filed at 05/09/2024 1533 Gross per 24 hour  Intake 454 ml  Output 1400 ml  Net -946 ml   Filed Weights   05/07/24 1350 05/08/24 0903  Weight: 72.6 kg (P) 72.6 kg    Examination:  General exam: NAD Respiratory system:  CTA Cardiovascular system: S 1, S 2 RRR Gastrointestinal system: BS present, soft, nt Central nervous system: Alert, conversant follows commands Extremities: clean dressing left LE, redness near the dressing  Data Reviewed: I have personally reviewed following labs and imaging studies  CBC: Recent Labs  Lab 05/07/24 1510 05/08/24 0554 05/09/24 0542 05/09/24 1612 05/10/24 0659  WBC 14.5* 10.2 12.7* 14.5* 14.2*  NEUTROABS 10.5*  --   --   --   --   HGB 12.2 11.6* 7.6* 8.8* 8.6*  HCT 37.0 33.6* 22.2* 25.5* 24.1*  MCV 99.7 94.9 98.2 93.8 93.4  PLT 229 207 107* 143* 149*   Basic Metabolic Panel: Recent Labs  Lab 05/07/24 1510 05/07/24 2021 05/08/24 0554 05/08/24 0819 05/09/24 0542 05/10/24 0659  NA 137  --  136  --  132* 138  K 4.1  --  3.8  --  4.5 3.7  CL 104  --  102  --  102 103  CO2 17*  --  24  --  20* 27  GLUCOSE 100*  --  120*  --  143* 94  BUN 23  --  14  --  18 20  CREATININE 0.73 0.73 0.74  --  0.76 0.85  CALCIUM  9.0  --  8.9  --  8.2* 8.3*  MG  --   --   --  2.0  --   --    GFR: Estimated Creatinine Clearance: 55.6 mL/min (by C-G formula based on SCr of 0.85 mg/dL). Liver Function Tests: No results for input(s): AST, ALT, ALKPHOS, BILITOT, PROT, ALBUMIN  in the last 168 hours. No results for input(s): LIPASE, AMYLASE in the last 168 hours. No results for input(s): AMMONIA in the last 168 hours. Coagulation Profile: No results for input(s): INR, PROTIME in the last 168 hours. Cardiac Enzymes: No results for input(s): CKTOTAL, CKMB, CKMBINDEX, TROPONINI in the last 168 hours. BNP (last 3 results) Recent Labs    05/07/24 2021  PROBNP 365.0*   HbA1C: No results for input(s): HGBA1C in the last 72 hours. CBG: Recent Labs  Lab 05/09/24 0652 05/09/24 1159 05/09/24 1620 05/09/24 2230 05/10/24 0639  GLUCAP 133* 125* 122* 108* 96   Lipid Profile: No results for input(s): CHOL, HDL, LDLCALC, TRIG, CHOLHDL,  LDLDIRECT in the last 72 hours. Thyroid Function Tests: No results for input(s): TSH, T4TOTAL, FREET4, T3FREE, THYROIDAB in the last 72 hours. Anemia Panel: No results for input(s): VITAMINB12, FOLATE, FERRITIN, TIBC, IRON, RETICCTPCT in the last 72 hours. Sepsis Labs: No results for input(s): PROCALCITON, LATICACIDVEN in the last 168 hours.  Recent Results (from the past 240 hours)  Surgical PCR screen     Status: None   Collection Time: 05/07/24  8:31 PM   Specimen: Nasal Mucosa; Nasal Swab  Result Value Ref Range Status   MRSA, PCR NEGATIVE NEGATIVE Final   Staphylococcus aureus NEGATIVE NEGATIVE Final    Comment: (NOTE) The Xpert SA Assay (FDA approved for NASAL specimens in patients  27 years of age and older), is one component of a comprehensive surveillance program. It is not intended to diagnose infection nor to guide or monitor treatment. Performed at [facility], [address], [place], [place] [address]          Radiology Studies: DG HIP UNILAT W OR W/O PELVIS 2-3 VIEWS LEFT Result Date: 05/08/2024 CLINICAL DATA:  Status post left hip arthroplasty. EXAM: DG HIP (WITH OR WITHOUT PELVIS) 2-3V LEFT COMPARISON:  Preoperative imaging FINDINGS: Left hip arthroplasty in expected alignment. No periprosthetic lucency or fracture. Recent postsurgical change includes air and edema in the soft tissues. IMPRESSION: Left hip arthroplasty without immediate postoperative complication. Electronically Signed   By: Andrea Gasman M.D.   On: 05/08/2024 15:46   DG HIP UNILAT WITH PELVIS 1V LEFT Result Date: 05/08/2024 CLINICAL DATA:  Elective surgery EXAM: DG HIP (WITH OR WITHOUT PELVIS) 1V*L* COMPARISON:  Radiograph yesterday FINDINGS: Ten fluoroscopic spot views of the pelvis and left hip obtained in the operating room. Sequential images during hip arthroplasty. Fluoroscopy time 26.4 seconds. Dose 2.0657 mGy. IMPRESSION: Intraoperative fluoroscopy during  left hip arthroplasty. Electronically Signed   By: Andrea Gasman M.D.   On: 05/08/2024 15:45   DG C-Arm 1-60 Min-No Report Result Date: 05/08/2024 Fluoroscopy was utilized by the requesting physician.  No radiographic interpretation.   DG C-Arm 1-60 Min-No Report Result Date: 05/08/2024 Fluoroscopy was utilized by the requesting physician.  No radiographic interpretation.   DG C-Arm 1-60 Min-No Report Result Date: 05/08/2024 Fluoroscopy was utilized by the requesting physician.  No radiographic interpretation.        Scheduled Meds:  acetaminophen   650 mg Oral TID   aspirin  EC  81 mg Oral Daily   cyanocobalamin   1,000 mcg Oral Daily   docusate sodium   100 mg Oral BID   feeding supplement (GLUCERNA SHAKE)  237 mL Oral TID BM   folic acid   1,000 mcg Oral Daily   multivitamin with minerals  1 tablet Oral Daily   mupirocin  ointment  1 Application Nasal BID   pantoprazole   40 mg Oral Daily   polyethylene glycol  17 g Oral Daily   predniSONE   5 mg Oral q AM   rosuvastatin   40 mg Oral Daily   senna  1 tablet Oral BID   Continuous Infusions:   LOS: 3 days    Time spent: 35 minutes    Arieal Cuoco A Kristin Barcus, MD [facility]   If 7PM-7AM, please contact night-coverage [email or web address]  05/10/2024, 8:23 AM   "

## 2024-05-10 NOTE — Progress Notes (Signed)
 Mobility Specialist: Progress Note   05/10/24 1300  Mobility  Activity Ambulated with assistance  Level of Assistance Contact guard assist, steadying assist  Assistive Device Front wheel walker  Distance Ambulated (ft) 15 ft  LLE Weight Bearing Per Provider Order WBAT  Activity Response Tolerated well  Mobility Referral Yes  Mobility visit 1 Mobility  Mobility Specialist Start Time (ACUTE ONLY) 1230  Mobility Specialist Stop Time (ACUTE ONLY) 1242  Mobility Specialist Time Calculation (min) (ACUTE ONLY) 12 min    Pt received in bed, agreeable to mobility session. Daughter present. SV for bed mobility with extra time. MinA for STS. CGA for short bout of ambulation to the window and back to the chair. Tolerated very well just fatigued by the end of session. Happy with her progress. Agreed to sit up in the chair for awhile. Left in chair with all needs met, call bell in reach.   Ileana Lute Mobility Specialist Please contact via SecureChat or Rehab office at [phone]

## 2024-05-10 NOTE — Plan of Care (Signed)

## 2024-05-10 NOTE — Progress Notes (Signed)
 "    Subjective: 2 Days Post-Op s/p Procedures: ARTHROPLASTY, HIP, TOTAL, ANTERIOR APPROACH   Patient is alert, oriented. Difficulty sleeping last night, had just fallen asleep when I walked in. In no acute distress. Denies dizziness this morning at rest. Pain well controlled at this time.  No other complaints.     Objective:  PE: VITALS:   Vitals:   05/09/24 1454 05/09/24 2012 05/10/24 0414 05/10/24 0739  BP: (!) 116/43 (!) 116/48 (!) 114/48 (!) 116/41  Pulse: 89 92 91 87  Resp: 18 18 17    Temp: (!) 97.4 F (36.3 C) 98.1 F (36.7 C) 98.6 F (37 C) 98.4 F (36.9 C)  TempSrc:  Oral Oral Oral  SpO2: 98% 93% 94% 96%  Weight:      Height:       Sitting up in bed, in no acute distress Resp: no increased work of breathing MSK:  Sensation intact distally Intact pulses distally Dorsiflexion/Plantar flexion intact Incision: dressing C/D/I  LABS  Results for orders placed or performed during the hospital encounter of 05/07/24 (from the past 24 hours)  Prepare RBC (crossmatch)     Status: None   Collection Time: 05/09/24  9:04 AM  Result Value Ref Range   Order Confirmation      ORDER PROCESSED BY BLOOD BANK Performed at [facility], [address] [place], [place] [address]   Glucose, capillary     Status: Abnormal   Collection Time: 05/09/24 11:59 AM  Result Value Ref Range   Glucose-Capillary 125 (H) 70 - 99 mg/dL  CBC     Status: Abnormal   Collection Time: 05/09/24  4:12 PM  Result Value Ref Range   WBC 14.5 (H) 4.0 - 10.5 K/uL   RBC 2.72 (L) 3.87 - 5.11 MIL/uL   Hemoglobin 8.8 (L) 12.0 - 15.0 g/dL   HCT 74.4 (L) 63.9 - 53.9 %   MCV 93.8 80.0 - 100.0 fL   MCH 32.4 26.0 - 34.0 pg   MCHC 34.5 30.0 - 36.0 g/dL   RDW 83.1 (H) 88.4 - 84.4 %   Platelets 143 (L) 150 - 400 K/uL   nRBC 0.0 0.0 - 0.2 %  Glucose, capillary     Status: Abnormal   Collection Time: 05/09/24  4:20 PM  Result Value Ref Range   Glucose-Capillary 122 (H) 70 - 99 mg/dL  Glucose,  capillary     Status: Abnormal   Collection Time: 05/09/24 10:30 PM  Result Value Ref Range   Glucose-Capillary 108 (H) 70 - 99 mg/dL  Glucose, capillary     Status: None   Collection Time: 05/10/24  6:39 AM  Result Value Ref Range   Glucose-Capillary 96 70 - 99 mg/dL  CBC     Status: Abnormal   Collection Time: 05/10/24  6:59 AM  Result Value Ref Range   WBC 14.2 (H) 4.0 - 10.5 K/uL   RBC 2.58 (L) 3.87 - 5.11 MIL/uL   Hemoglobin 8.6 (L) 12.0 - 15.0 g/dL   HCT 75.8 (L) 63.9 - 53.9 %   MCV 93.4 80.0 - 100.0 fL   MCH 33.3 26.0 - 34.0 pg   MCHC 35.7 30.0 - 36.0 g/dL   RDW 82.5 (H) 88.4 - 84.4 %   Platelets 149 (L) 150 - 400 K/uL   nRBC 0.0 0.0 - 0.2 %  Basic metabolic panel     Status: Abnormal   Collection Time: 05/10/24  6:59 AM  Result Value Ref Range  Sodium 138 135 - 145 mmol/L   Potassium 3.7 3.5 - 5.1 mmol/L   Chloride 103 98 - 111 mmol/L   CO2 27 22 - 32 mmol/L   Glucose, Bld 94 70 - 99 mg/dL   BUN 20 8 - 23 mg/dL   Creatinine, Ser 9.14 0.44 - 1.00 mg/dL   Calcium  8.3 (L) 8.9 - 10.3 mg/dL   GFR, Estimated >39 >39 mL/min   Anion gap 8 5 - 15    DG HIP UNILAT W OR W/O PELVIS 2-3 VIEWS LEFT Result Date: 05/08/2024 CLINICAL DATA:  Status post left hip arthroplasty. EXAM: DG HIP (WITH OR WITHOUT PELVIS) 2-3V LEFT COMPARISON:  Preoperative imaging FINDINGS: Left hip arthroplasty in expected alignment. No periprosthetic lucency or fracture. Recent postsurgical change includes air and edema in the soft tissues. IMPRESSION: Left hip arthroplasty without immediate postoperative complication. Electronically Signed   By: Andrea Gasman M.D.   On: 05/08/2024 15:46   DG HIP UNILAT WITH PELVIS 1V LEFT Result Date: 05/08/2024 CLINICAL DATA:  Elective surgery EXAM: DG HIP (WITH OR WITHOUT PELVIS) 1V*L* COMPARISON:  Radiograph yesterday FINDINGS: Ten fluoroscopic spot views of the pelvis and left hip obtained in the operating room. Sequential images during hip arthroplasty. Fluoroscopy  time 26.4 seconds. Dose 2.0657 mGy. IMPRESSION: Intraoperative fluoroscopy during left hip arthroplasty. Electronically Signed   By: Andrea Gasman M.D.   On: 05/08/2024 15:45   DG C-Arm 1-60 Min-No Report Result Date: 05/08/2024 Fluoroscopy was utilized by the requesting physician.  No radiographic interpretation.   DG C-Arm 1-60 Min-No Report Result Date: 05/08/2024 Fluoroscopy was utilized by the requesting physician.  No radiographic interpretation.   DG C-Arm 1-60 Min-No Report Result Date: 05/08/2024 Fluoroscopy was utilized by the requesting physician.  No radiographic interpretation.    Today's  total administered Morphine  Milligram Equivalents: 5 Yesterday's total administered Morphine  Milligram Equivalents: 10  Assessment/Plan: Principal Problem:   Hip fracture (HCC)  2 Days Post-Op s/p Procedures: ARTHROPLASTY, HIP, TOTAL, ANTERIOR APPROACH  ABLA: Hbg 11.6 pre-op> 7.6 yesterday 1 unit given > 8.6 this morning.  Weightbearing: WBAT LLE Insicional and dressing care: Reinforce dressings as needed VTE prophylaxis: plan is for plavix  and aspirin  post op, will continue on her 81 mg daily aspirin , holding plavix  for now due to transfusion yesterday, plan to restart tomorrow if Hbg stabilizing  Pain control: continue current regimen Follow - up plan: w/ Dr. Kendal Dispo: PT recommending SNF, OT recommending HHOT at discharge. Patient and daughter in room unsure of what they want to do in regards to SNF or home with HHPT. Patient's sister will be coming to take care of her at discharge and they feel she will be capable of taking full care of her at home. They will discuss and think about what will work best today.    Contact information:   Army Daring, DEVONNA Tzzxijbd 8-5  After hours and holidays please check [email or web address] for group call information for [facility]  Army MARLA Daring 05/10/2024, 8:33 AM "

## 2024-05-11 ENCOUNTER — Encounter (HOSPITAL_COMMUNITY): Payer: Self-pay | Admitting: Student

## 2024-05-11 ENCOUNTER — Telehealth: Payer: Self-pay | Admitting: Physician Assistant

## 2024-05-11 LAB — CBC
HCT: 25.7 % — ABNORMAL LOW (ref 36.0–46.0)
Hemoglobin: 8.8 g/dL — ABNORMAL LOW (ref 12.0–15.0)
MCH: 32.5 pg (ref 26.0–34.0)
MCHC: 34.2 g/dL (ref 30.0–36.0)
MCV: 94.8 fL (ref 80.0–100.0)
Platelets: 209 K/uL (ref 150–400)
RBC: 2.71 MIL/uL — ABNORMAL LOW (ref 3.87–5.11)
RDW: 16.8 % — ABNORMAL HIGH (ref 11.5–15.5)
WBC: 11.6 K/uL — ABNORMAL HIGH (ref 4.0–10.5)
nRBC: 0 % (ref 0.0–0.2)

## 2024-05-11 LAB — BASIC METABOLIC PANEL WITH GFR
Anion gap: 8 (ref 5–15)
BUN: 15 mg/dL (ref 8–23)
CO2: 28 mmol/L (ref 22–32)
Calcium: 8.6 mg/dL — ABNORMAL LOW (ref 8.9–10.3)
Chloride: 101 mmol/L (ref 98–111)
Creatinine, Ser: 0.81 mg/dL (ref 0.44–1.00)
GFR, Estimated: >60 mL/min
Glucose, Bld: 95 mg/dL (ref 70–99)
Potassium: 3.8 mmol/L (ref 3.5–5.1)
Sodium: 137 mmol/L (ref 135–145)

## 2024-05-11 LAB — GLUCOSE, CAPILLARY
Glucose-Capillary: 89 mg/dL (ref 70–99)
Glucose-Capillary: 114 mg/dL — ABNORMAL HIGH (ref 70–99)
Glucose-Capillary: 114 mg/dL — ABNORMAL HIGH (ref 70–99)

## 2024-05-11 LAB — TYPE AND SCREEN
ABO/RH(D): A POS
Antibody Screen: NEGATIVE
Unit division: 0

## 2024-05-11 LAB — BPAM RBC
Blood Product Expiration Date: 202601262359
ISSUE DATE / TIME: 202601101114
Unit Type and Rh: 6200

## 2024-05-11 MED ORDER — CLOPIDOGREL BISULFATE 75 MG PO TABS
75.0000 mg | ORAL_TABLET | Freq: Every day | ORAL | Status: DC
  Administered 2024-05-11 – 2024-05-12 (×2): 75 mg via ORAL
  Filled 2024-05-11 (×2): qty 1

## 2024-05-11 NOTE — Progress Notes (Signed)
 " PROGRESS NOTE    Veronica Romero  FMW:[id number] DOB: Aug 28, 1950 DOA: 05/07/2024 PCP: Melvin Pao, NP   Brief Narrative: Veronica Romero is a 74 y.o. female with medical history significant of ischemic cardiomyopathy last ejection fraction 45 to 50% 05/07/2023, diabetes, ILD, hypertension, CAD, pulmonary hypertension, rheumatoid arthritis, who presented after a mechanical fall. Patient was found to have Subcapital left femoral neck fracture with varus angulation and shortening of the femoral neck .      Assessment & Plan:   Principal Problem:   Hip fracture (HCC)  1-Subcapital left femoral neck fracture; - Patient presented after a mechanical fall found to have femoral neck fracture. - Orthopedic consulted. - EKG: and pro-BNP: 365, no evidence of HF exacerbation.  -Continue with laxatives.  -Patient underwent left total hip arthroplasty of the left femoral neck fracture on 1/9 by Dr. Kendal.  Patient wants to discharge Home. Plan for discharge tomorrow.   Acute blood loss anemia; post op , expected.  She does complaining  of dizziness and dyspnea on exertion.  Hb down to 7.6 from 11---Received  one unit of Blood due to symptoms and history Heart diseases.  Hb post transfusion increased to 8.6 Monitor. Stable.    Interstitial lung disease -Chest x-ray: Chronic appearing increased lung markings with mild left basilar atelectasis and/or infiltrate. - Not on home oxygen - No appear to be on any inhaler at home -PRN nebulizers   History of ischemic cardiomyopathy with improved ejection fraction - History of OCT guided complex atherectomy to ostial mid LAD with DES 3.0 x 30 mm in 06/2021.  She has residual 50% stenosis in the mid RCA and distal LCx. - Patient denies chest pain, she is able to ambulate several blocks without getting shortness of breath or chest pain. -EKG: sinus rhythm, similar to EKG from 01/07/2024  and proBNP 360 -Continue with aspirin . Resume plavix  today, if  ok by ortho Troponin negative I informed Dr Krystal of patient admission.   Diabetes type 2 - Not on medication.  Monitor CBG   Metabolic acidosis; Resolved.    Rheumatoid arthritis - On Humira and low-dose prednisone  -Continue chronic dose of prednisone  5 mg daily   Iron deficiency anemia: Continue folic acid  and B12 supplements.  Monitor hemoglobin   Leukocytosis: Suspect demargination.  Stress.  Increased again, denies cough, dysuria. Monitor.    Insomnia. PRN Trazodone  ordered.   Estimated body mass index is 25.06 kg/m (pended) as calculated from the following:   Height as of this encounter: (P) 5' 7 (1.702 m).   Weight as of this encounter: (P) 72.6 kg.   DVT prophylaxis: Lovenox  Code Status: Full code Family Communication:Daughter at bedside 1/11 Disposition Plan:  Status is: Inpatient Remains inpatient appropriate because: management of  hip fracture.     Consultants:  Ortho Cardiology  Procedures:  Patient underwent left total hip arthroplasty of the left femoral neck fracture on 1/9  Antimicrobials:    Subjective: She was able to sleep some after she took trazodone .  Denies dizziness on ambulation. She was able to walk to recliner yesterday   Objective: Vitals:   05/10/24 1935 05/11/24 0413 05/11/24 0751 05/11/24 0800  BP: (!) 112/47 (!) 108/41 104/77 104/77  Pulse: 91 87 88 88  Resp: 17 16    Temp: 98.2 F (36.8 C) 98.3 F (36.8 C) 99.1 F (37.3 C) 99.1 F (37.3 C)  TempSrc: Oral Oral Oral Oral  SpO2: 96% 96% 94% 94%  Weight:  Height:        Intake/Output Summary (Last 24 hours) at 05/11/2024 1333 Last data filed at 05/11/2024 0500 Gross per 24 hour  Intake 120 ml  Output 1600 ml  Net -1480 ml   Filed Weights   05/07/24 1350 05/08/24 0903  Weight: 72.6 kg (P) 72.6 kg    Examination:  General exam: NAD Respiratory system: CTA Cardiovascular system: SS 1, S 2 RRR Gastrointestinal system: BS present, soft nt Central nervous  system:Alert, conversant.  Extremities: clean dressing left LE, redness near the dressing  Data Reviewed: I have personally reviewed following labs and imaging studies  CBC: Recent Labs  Lab 05/07/24 1510 05/08/24 0554 05/09/24 0542 05/09/24 1612 05/10/24 0659 05/11/24 0720  WBC 14.5* 10.2 12.7* 14.5* 14.2* 11.6*  NEUTROABS 10.5*  --   --   --   --   --   HGB 12.2 11.6* 7.6* 8.8* 8.6* 8.8*  HCT 37.0 33.6* 22.2* 25.5* 24.1* 25.7*  MCV 99.7 94.9 98.2 93.8 93.4 94.8  PLT 229 207 107* 143* 149* 209   Basic Metabolic Panel: Recent Labs  Lab 05/07/24 1510 05/07/24 2021 05/08/24 0554 05/08/24 0819 05/09/24 0542 05/10/24 0659 05/11/24 0720  NA 137  --  136  --  132* 138 137  K 4.1  --  3.8  --  4.5 3.7 3.8  CL 104  --  102  --  102 103 101  CO2 17*  --  24  --  20* 27 28  GLUCOSE 100*  --  120*  --  143* 94 95  BUN 23  --  14  --  18 20 15   CREATININE 0.73 0.73 0.74  --  0.76 0.85 0.81  CALCIUM  9.0  --  8.9  --  8.2* 8.3* 8.6*  MG  --   --   --  2.0  --   --   --    GFR: Estimated Creatinine Clearance: 58.4 mL/min (by C-G formula based on SCr of 0.81 mg/dL). Liver Function Tests: No results for input(s): AST, ALT, ALKPHOS, BILITOT, PROT, ALBUMIN  in the last 168 hours. No results for input(s): LIPASE, AMYLASE in the last 168 hours. No results for input(s): AMMONIA in the last 168 hours. Coagulation Profile: No results for input(s): INR, PROTIME in the last 168 hours. Cardiac Enzymes: No results for input(s): CKTOTAL, CKMB, CKMBINDEX, TROPONINI in the last 168 hours. BNP (last 3 results) Recent Labs    05/07/24 2021  PROBNP 365.0*   HbA1C: No results for input(s): HGBA1C in the last 72 hours. CBG: Recent Labs  Lab 05/10/24 1129 05/10/24 1613 05/10/24 2100 05/11/24 0613 05/11/24 1137  GLUCAP 203* 100* 106* 89 114*   Lipid Profile: No results for input(s): CHOL, HDL, LDLCALC, TRIG, CHOLHDL, LDLDIRECT in the last 72  hours. Thyroid Function Tests: No results for input(s): TSH, T4TOTAL, FREET4, T3FREE, THYROIDAB in the last 72 hours. Anemia Panel: No results for input(s): VITAMINB12, FOLATE, FERRITIN, TIBC, IRON, RETICCTPCT in the last 72 hours. Sepsis Labs: No results for input(s): PROCALCITON, LATICACIDVEN in the last 168 hours.  Recent Results (from the past 240 hours)  Surgical PCR screen     Status: None   Collection Time: 05/07/24  8:31 PM   Specimen: Nasal Mucosa; Nasal Swab  Result Value Ref Range Status   MRSA, PCR NEGATIVE NEGATIVE Final   Staphylococcus aureus NEGATIVE NEGATIVE Final    Comment: (NOTE) The Xpert SA Assay (FDA approved for NASAL specimens in patients 22 years of  age and older), is one component of a comprehensive surveillance program. It is not intended to diagnose infection nor to guide or monitor treatment. Performed at [facility], [address] [place], [place] [address]          Radiology Studies: No results found.       Scheduled Meds:  acetaminophen   650 mg Oral TID   aspirin  EC  81 mg Oral Daily   cyanocobalamin   1,000 mcg Oral Daily   docusate sodium   100 mg Oral BID   feeding supplement (GLUCERNA SHAKE)  237 mL Oral TID BM   folic acid   1,000 mcg Oral Daily   multivitamin with minerals  1 tablet Oral Daily   mupirocin  ointment  1 Application Nasal BID   pantoprazole   40 mg Oral Daily   polyethylene glycol  17 g Oral Daily   predniSONE   5 mg Oral q AM   rosuvastatin   40 mg Oral Daily   senna  1 tablet Oral BID   Continuous Infusions:   LOS: 4 days    Time spent: 35 minutes    Selita Staiger A Kenley Rettinger, MD [facility]   If 7PM-7AM, please contact night-coverage [email or web address]  05/11/2024, 1:33 PM   "

## 2024-05-11 NOTE — Progress Notes (Signed)
 Occupational Therapy Treatment Patient Details Name: Veronica Romero MRN: [id number] DOB: 05-18-1950 Today's Date: 05/11/2024   History of present illness 74 y.o. female admitted 05/07/24 after fall sustaining L femoral neck fx. S/p L THA 1/9. PMH includes CHF, CAD, DM, HTN, ILD, ischemic cardiomyopathy, RA, pulmonary HTN, osteoporosis.   OT comments  Pt c/o mild pain to L hip, moving around fairly well overall. Pt able to stand from recliner with CGA, static stands without support when adjusting RW for her height. Pt able to complete toileting hygiene with set up. Pt with significant RA to B hands, able to complete feeding/grooming with set up. Pt given foam for hang grip exercises, yellow theraband for BUE exercises. Pt able to complete 10 reps of each exercises sitting EOB. Pt c/o of back/neck pain, instructed on basic scapular retraction with 10-20 second holds, chin tucks, trunk rotation in seated position. Pt tolerated session well, eager to improve and return home. Will continue to see Pt acutely to progress as able, HHOT still recommended.       If plan is discharge home, recommend the following:  A little help with walking and/or transfers;A lot of help with bathing/dressing/bathroom;Assistance with cooking/housework;Assist for transportation;Help with stairs or ramp for entrance   Equipment Recommendations  BSC/3in1    Recommendations for Other Services      Precautions / Restrictions Precautions Precautions: Fall Recall of Precautions/Restrictions: Intact Restrictions Weight Bearing Restrictions Per Provider Order: Yes LLE Weight Bearing Per Provider Order: Weight bearing as tolerated       Mobility Bed Mobility Overal bed mobility: Needs Assistance Bed Mobility: Sit to Supine       Sit to supine: Supervision   General bed mobility comments: supervision for sit to supine    Transfers Overall transfer level: Needs assistance Equipment used: Rolling walker (2  wheels) Transfers: Sit to/from Stand Sit to Stand: Contact guard assist           General transfer comment: CGA for safety, no physical assist     Balance Overall balance assessment: Needs assistance Sitting-balance support: No upper extremity supported, Feet supported Sitting balance-Leahy Scale: Fair     Standing balance support: Single extremity supported, During functional activity Standing balance-Leahy Scale: Fair Standing balance comment: static stands without assist as needed                           ADL either performed or assessed with clinical judgement   ADL Overall ADL's : Needs assistance/impaired Eating/Feeding: Set up;Sitting               Upper Body Dressing : Set up;Sitting       Toilet Transfer: Contact guard assist;Rolling walker (2 wheels)   Toileting- Clothing Manipulation and Hygiene: Set up;Sitting/lateral lean;Sit to/from stand       Functional mobility during ADLs: Contact guard assist General ADL Comments: CGA for mobility with RW, able to complete toileting with set up/supervision    Extremity/Trunk Assessment              Vision       Perception     Praxis     Communication Communication Communication: No apparent difficulties   Cognition Arousal: Alert Behavior During Therapy: WFL for tasks assessed/performed Cognition: No apparent impairments                               Following commands:  Intact        Cueing   Cueing Techniques: Verbal cues  Exercises Exercises: Other exercises Other Exercises Other Exercises: BUE theraband exercises Other Exercises: chin tucks Other Exercises: scapular retraction    Shoulder Instructions       General Comments      Pertinent Vitals/ Pain       Pain Assessment Pain Assessment: Faces Faces Pain Scale: Hurts little more Pain Location: L hip Pain Descriptors / Indicators: Guarding, Sore, Aching Pain Intervention(s): Monitored during  session  Home Living                                          Prior Functioning/Environment              Frequency  Min 2X/week        Progress Toward Goals  OT Goals(current goals can now be found in the care plan section)  Progress towards OT goals: Progressing toward goals  Acute Rehab OT Goals Patient Stated Goal: to return home OT Goal Formulation: With patient Time For Goal Achievement: 05/23/24 Potential to Achieve Goals: Good ADL Goals Pt Will Perform Lower Body Bathing: with modified independence;sit to/from stand;sitting/lateral leans Pt Will Perform Lower Body Dressing: with modified independence;sitting/lateral leans;sit to/from stand Pt Will Transfer to Toilet: with modified independence;ambulating;regular height toilet Pt Will Perform Toileting - Clothing Manipulation and hygiene: with modified independence;sitting/lateral leans;sit to/from stand Additional ADL Goal #1: Patient will be able to complete bed mobility at [facility] as a precursor to OOB activity. Additional ADL Goal #2: Patient will be able to complete functional task in standing for 3 - 5 minutes prior to needing seated rest break in order to increase overall activity tolerance.  Plan      Co-evaluation                 AM-PAC OT 6 Clicks Daily Activity     Outcome Measure   Help from another person eating meals?: A Little Help from another person taking care of personal grooming?: A Little Help from another person toileting, which includes using toliet, bedpan, or urinal?: A Little Help from another person bathing (including washing, rinsing, drying)?: A Lot Help from another person to put on and taking off regular upper body clothing?: A Little Help from another person to put on and taking off regular lower body clothing?: A Lot 6 Click Score: 16    End of Session Equipment Utilized During Treatment: Rolling walker (2 wheels);Gait belt  OT Visit Diagnosis:  Unsteadiness on feet (R26.81);Other abnormalities of gait and mobility (R26.89);Muscle weakness (generalized) (M62.81);History of falling (Z91.81);Pain Pain - Right/Left: Left Pain - part of body: Hip   Activity Tolerance Patient tolerated treatment well   Patient Left in bed;with call bell/phone within reach   Nurse Communication Mobility status        Time: 8667-8642 OT Time Calculation (min): 25 min  Charges: OT General Charges $OT Visit: 1 Visit OT Treatments $Self Care/Home Management : 8-22 mins $Therapeutic Exercise: 8-22 mins  Alfie Rideaux, OTR/L   Elouise JONELLE Bott 05/11/2024, 2:06 PM

## 2024-05-11 NOTE — Care Management Important Message (Signed)
 Important Message  Patient Details  Name: Veronica Romero MRN: [id number] Date of Birth: [birth year]   Important Message Given:  Yes - Medicare IM     Jennie Laneta Dragon 05/11/2024, 1:05 PM

## 2024-05-11 NOTE — Progress Notes (Signed)
 Mobility Specialist Progress Note:    05/11/24 0923  Mobility  Activity Ambulated with assistance (In room/ hallway)  Level of Assistance Contact guard assist, steadying assist (+ chair follow)  Assistive Device Front wheel walker  Distance Ambulated (ft) 15 ft (+30)  LLE Weight Bearing Per Provider Order WBAT  Activity Response Tolerated well  Mobility Referral Yes  Mobility visit 1 Mobility  Mobility Specialist Start Time (ACUTE ONLY) 0907  Mobility Specialist Stop Time (ACUTE ONLY) [id number]  Mobility Specialist Time Calculation (min) (ACUTE ONLY) 16 min   Received pt in bed and agreeable to mobility. Pt required MinG and chair follow to further gait distance. Pt had x1 seated rest break d/t BUE fatigue. Pt returned to room via chair. Left in chair with personal belongings and call light within reach. All needs met.  Lavanda Pollack Mobility Specialist  Please contact via [employer] or  Rehab Office [phone]

## 2024-05-11 NOTE — TOC Initial Note (Signed)
 Transition of Care [facility]) - Initial/Assessment Note    Patient Details  Name: Veronica Romero MRN: [id number] Date of Birth: Jan 18, 1951  Transition of Care [facility]) CM/SW Contact:    Bridget Cordella Simmonds, LCSW Phone Number: 05/11/2024, 12:38 PM  Clinical Narrative:  CSW met with pt regarding PT recommendation for SNF.  Pt is declining SNF.  Pt lives with her daughter [name] and her teenage grandson.  No current home services.  Pt reports they have agreed that they can assist pt at home with HHPT.  Pt reports her sister is also coming to [facility] tomorrow morning to stay for several weeks to provide more help.  Permission given to speak with daughter [name].  No DME at home except a cane.  HH preference discussed, none indicated.   CSW spoke with [name], who confirmed the above, does want to plan for DC home with [facility] tomorrow after sister arrives. Does want walker and 3n1 and possibly shower chair separate.             RNCM notified.      Expected Discharge Plan: Home w Home Health Services Barriers to Discharge: Continued Medical Work up   Patient Goals and CMS Choice Patient states their goals for this hospitalization and ongoing recovery are:: be a normal person          Expected Discharge Plan and Services In-house Referral: Clinical Social Work   Post Acute Care Choice: Home Health Living arrangements for the past 2 months: Single Family Home                                      Prior Living Arrangements/Services Living arrangements for the past 2 months: Single Family Home Lives with:: Adult Children, Relatives (lives with daughter [name] and teenage grandson) Patient language and need for interpreter reviewed:: Yes Do you feel safe going back to the place where you live?: Yes      Need for Family Participation in Patient Care: Yes (Comment) Care giver support system in place?: Yes (comment) Current home services: Other (comment) (none) Criminal  Activity/Legal Involvement Pertinent to Current Situation/Hospitalization: No - Comment as needed  Activities of Daily Living   ADL Screening (condition at time of admission) Independently performs ADLs?: No Is the patient deaf or have difficulty hearing?: No Does the patient have difficulty seeing, even when wearing glasses/contacts?: No Does the patient have difficulty concentrating, remembering, or making decisions?: No  Permission Sought/Granted Permission sought to share information with : Family Supports Permission granted to share information with : Yes, Verbal Permission Granted  Share Information with NAME: daughter [name]           Emotional Assessment Appearance:: Appears stated age Attitude/Demeanor/Rapport: Engaged Affect (typically observed): Appropriate, Pleasant Orientation: : Oriented to Self, Oriented to Place, Oriented to  Time, Oriented to Situation      Admission diagnosis:  Hip fracture (HCC) [S72.009A] Fall, initial encounter [W19.XXXA] Closed fracture of neck of left femur, initial encounter (HCC) [S72.002A] Patient Active Problem List   Diagnosis Date Noted   Hip fracture (HCC) 05/07/2024   Iron deficiency anemia 01/20/2024   Anemia 01/17/2022   Coronary artery disease involving native coronary artery of native heart without angina pectoris 08/11/2021   CAD (coronary artery disease) 07/17/2021   HFrEF (heart failure with reduced ejection fraction) (HCC) 07/13/2021   Medication management 07/13/2021   Ischemic cardiomyopathy 07/13/2021   Mild  pulmonary hypertension (HCC) 07/13/2021   Mixed hyperlipidemia 07/13/2021   Hiatal hernia 07/10/2021   Atherosclerosis of aorta 07/10/2021   NSTEMI (non-ST elevated myocardial infarction) (HCC) 06/21/2021   Alcohol  use 06/21/2021   CAD (coronary artery disease), native coronary artery 06/16/2021   Acute respiratory failure with hypoxia and hypercapnia (HCC) 06/12/2021   Acute respiratory failure (HCC)  06/12/2021   Malnutrition of moderate degree 06/12/2021   Chest pain 06/11/2021   Dyspnea 06/11/2021   Rheumatoid arthritis (HCC) 06/11/2021   PCP:  Melvin Pao, NP Pharmacy:   [facility] [phone] - [place], [place] - [address] AT [facility] [address] [place] [address] Phone: [phone] Fax: [phone]     Social Drivers of Health (SDOH) Social History: SDOH Screenings   Food Insecurity: No Food Insecurity (05/07/2024)  Housing: Low Risk (05/08/2024)  Transportation Needs: No Transportation Needs (05/08/2024)  Utilities: Not At Risk (05/08/2024)  Alcohol  Screen: Low Risk (12/16/2023)  Depression (PHQ2-9): Low Risk (01/20/2024)  Recent Concern: Depression (PHQ2-9) - Medium Risk (12/17/2023)  Financial Resource Strain: Medium Risk (12/16/2023)  Physical Activity: Insufficiently Active (12/16/2023)  Social Connections: Socially Isolated (05/08/2024)  Stress: Stress Concern Present (12/16/2023)  Tobacco Use: Low Risk (05/08/2024)  Health Literacy: Adequate Health Literacy (10/31/2023)   SDOH Interventions:     Readmission Risk Interventions     No data to display

## 2024-05-11 NOTE — Discharge Instructions (Signed)
 Franky Light, MD Lauraine Moores PA-C [facility] [address] [place], [place] [address] [phone] (tel)   [phone] (fax)   TOTAL HIP REPLACEMENT POSTOPERATIVE DIRECTIONS    Hip Rehabilitation, Guidelines Following Surgery   WEIGHT BEARING Weight bearing as tolerated with assist device (walker, cane, etc) as directed, use it as long as suggested by your surgeon or therapist, typically at least 4-6 weeks.  The results of a hip operation are greatly improved after range of motion and muscle strengthening exercises. Follow all safety measures which are given to protect your hip. If any of these exercises cause increased pain or swelling in your joint, decrease the amount until you are comfortable again. Then slowly increase the exercises. Call your caregiver if you have problems or questions.   HOME CARE INSTRUCTIONS  Most of the following instructions are designed to prevent the dislocation of your new hip.  Remove items at home which could result in a fall. This includes throw rugs or furniture in walking pathways.  Continue medications as instructed at time of discharge. You may have some home medications which will be placed on hold until you complete the course of blood thinner medication. You may start showering once you are discharged home. Do not remove your dressing. Do not put on socks or shoes without following the instructions of your caregivers.   Sit on chairs with arms. Use the chair arms to help push yourself up when arising.  Arrange for the use of a toilet seat elevator so you are not sitting low.  Walk with walker as instructed.  You may resume a sexual relationship in one month or when given the OK by your caregiver.  Use walker as long as suggested by your caregivers.  You may put full weight on your legs and walk as much as is comfortable. Avoid periods of inactivity such as sitting longer than an hour when not asleep. This helps  prevent blood clots.  You may return to work once you are cleared by Designer, industrial/product.  Do not drive a car for 6 weeks or until released by your surgeon.  Do not drive while taking narcotics.  Wear elastic stockings for two weeks following surgery during the day but you may remove then at night.  Make sure you keep all of your appointments after your operation with all of your doctors and caregivers. You should call the office at the above phone number and make an appointment for approximately two weeks after the date of your surgery. Please pick up a stool softener and laxative for home use as long as you are requiring pain medications. ICE to the affected hip every three hours for 30 minutes at a time and then as needed for pain and swelling. Continue to use ice on the hip for pain and swelling from surgery. You may notice swelling that will progress down to the foot and ankle.  This is normal after surgery.  Elevate the leg when you are not up walking on it.   It is important for you to complete the blood thinner medication as prescribed by your doctor. Continue to use the breathing machine which will help keep your temperature down.  It is common for your temperature to cycle up and down following surgery, especially at night when you are not up moving around and exerting yourself.  The breathing machine keeps your lungs expanded and your temperature down.  RANGE OF MOTION AND STRENGTHENING EXERCISES  These exercises  are designed to help you keep full movement of your hip joint. Follow your caregiver's or physical therapist's instructions. Perform all exercises about fifteen times, three times per day or as directed. Exercise both hips, even if you have had only one joint replacement. These exercises can be done on a training (exercise) mat, on the floor, on a table or on a bed. Use whatever works the best and is most comfortable for you. Use music or television while you are exercising so that the  exercises are a pleasant break in your day. This will make your life better with the exercises acting as a break in routine you can look forward to.  Lying on your back, slowly slide your foot toward your buttocks, raising your knee up off the floor. Then slowly slide your foot back down until your leg is straight again.  Lying on your back spread your legs as far apart as you can without causing discomfort.  Lying on your side, raise your upper leg and foot straight up from the floor as far as is comfortable. Slowly lower the leg and repeat.  Lying on your back, tighten up the muscle in the front of your thigh (quadriceps muscles). You can do this by keeping your leg straight and trying to raise your heel off the floor. This helps strengthen the largest muscle supporting your knee.  Lying on your back, tighten up the muscles of your buttocks both with the legs straight and with the knee bent at a comfortable angle while keeping your heel on the floor.   SKILLED REHAB INSTRUCTIONS: If the patient is transferred to a skilled rehab facility following release from the hospital, a list of the current medications will be sent to the facility for the patient to continue.  When discharged from the skilled rehab facility, please have the facility set up the patient's Home Health Physical Therapy prior to being released. Also, the skilled facility will be responsible for providing the patient with their medications at time of release from the facility to include their pain medication and their blood thinner medication. If the patient is still at the rehab facility at time of the two week follow up appointment, the skilled rehab facility will also need to assist the patient in arranging follow up appointment in our office and any transportation needs.  POST-OPERATIVE OPIOID TAPER INSTRUCTIONS: It is important to wean off of your opioid medication as soon as possible. If you do not need pain medication after your  surgery it is ok to stop day one. Opioids include: Codeine, Hydrocodone(Norco, Vicodin), Oxycodone(Percocet, oxycontin) and hydromorphone amongst others.  Long term and even short term use of opiods can cause: Increased pain response Dependence Constipation Depression Respiratory depression And more.  Withdrawal symptoms can include Flu like symptoms Nausea, vomiting And more Techniques to manage these symptoms Hydrate well Eat regular healthy meals Stay active Use relaxation techniques(deep breathing, meditating, yoga) Do Not substitute Alcohol to help with tapering If you have been on opioids for less than two weeks and do not have pain than it is ok to stop all together.  Plan to wean off of opioids This plan should start within one week post op of your joint replacement. Maintain the same interval or time between taking each dose and first decrease the dose.  Cut the total daily intake of opioids by one tablet each day Next start to increase the time between doses. The last dose that should be eliminated is the evening  dose.    MAKE SURE YOU:  Understand these instructions.  Will watch your condition.  Will get help right away if you are not doing well or get worse.  Pick up stool softner and laxative for home use following surgery while on pain medications. Do not remove your dressing. The dressing is waterproof--it is OK to take showers. Continue to use ice for pain and swelling after surgery. Do not use any lotions or creams on the incision until instructed by your surgeon. Total Hip Protocol.

## 2024-05-11 NOTE — Progress Notes (Signed)
 Physical Therapy Treatment Patient Details Name: Veronica Romero MRN: [id number] DOB: 1950/09/26 Today's Date: 05/11/2024   History of Present Illness 74 y.o. female admitted 05/07/24 after fall sustaining L femoral neck fx. S/p L THA 1/9. PMH includes CHF, CAD, DM, HTN, ILD, ischemic cardiomyopathy, RA, pulmonary HTN, osteoporosis.    PT Comments  Pt currently presenting at Mod I for bed mobility, sit to stand with RW and CGA for gait with RW for 60 ft. Pt and family were educated on sequencing with RW for threshold to get into home and provided with gait belt. Briefly discussed how to navigate walk in shower with RW; pt reports she did discuss this with OT. Pt advised to not try to get into shower by herself. Pt reports that she will have assistance 24/7 at home. Due to pt current functional status, home set up and available assistance at home recommending skilled physical therapy services 3x/week in order to address strength, balance and functional mobility to decrease risk for falls, injury and re-hospitalization.       If plan is discharge home, recommend the following: A little help with walking and/or transfers;Assist for transportation;Assistance with cooking/housework;Help with stairs or ramp for entrance   Can travel by private vehicle     No  Equipment Recommendations  Rolling walker (2 wheels)       Precautions / Restrictions Precautions Precautions: Fall Recall of Precautions/Restrictions: Intact Restrictions Weight Bearing Restrictions Per Provider Order: Yes LLE Weight Bearing Per Provider Order: Weight bearing as tolerated     Mobility  Bed Mobility Overal bed mobility: Modified Independent Bed Mobility: Supine to Sit, Sit to Supine     Supine to sit: Modified independent (Device/Increase time) Sit to supine: Modified independent (Device/Increase time)   General bed mobility comments: HOB slightly elevated. no need for physical assist, increase in time.     Transfers Overall transfer level: Modified independent Equipment used: Rolling walker (2 wheels) Transfers: Sit to/from Stand Sit to Stand: Modified independent (Device/Increase time)           General transfer comment: good hand placement.    Ambulation/Gait Ambulation/Gait assistance: Contact guard assist Gait Distance (Feet): 60 Feet Assistive device: Rolling walker (2 wheels) Gait Pattern/deviations: Step-through pattern, Antalgic, Decreased stance time - left Gait velocity: decreased Gait velocity interpretation: <1.31 ft/sec, indicative of household ambulator   General Gait Details: decreased hip/knee flexion on the L with occasional circumduction in order to avoid ROM of the R hip/knee. Would improve for short distances with cues for hip/knee flexion with over exaggeration of movement.     Balance Overall balance assessment: Mild deficits observed, not formally tested Sitting-balance support: No upper extremity supported, Feet supported Sitting balance-Leahy Scale: Good     Standing balance support: Single extremity supported, During functional activity Standing balance-Leahy Scale: Fair Standing balance comment: static stands without assist as needed      Communication Communication Communication: No apparent difficulties  Cognition Arousal: Alert Behavior During Therapy: WFL for tasks assessed/performed   PT - Cognitive impairments: No apparent impairments   Following commands: Intact      Cueing Cueing Techniques: Verbal cues     General Comments General comments (skin integrity, edema, etc.): HR remained WNL during session, no shortness of breathe noted. Pt family present during session      Pertinent Vitals/Pain Pain Assessment Pain Assessment: Faces Faces Pain Scale: Hurts little more Pain Location: L hip Pain Descriptors / Indicators: Guarding, Sore, Aching Pain Intervention(s): Monitored during session  PT Goals (current goals can  now be found in the care plan section) Acute Rehab PT Goals Patient Stated Goal: regain strength, return home PT Goal Formulation: With patient/family Time For Goal Achievement: 05/23/24 Potential to Achieve Goals: Good Progress towards PT goals: Progressing toward goals    Frequency    Min 2X/week      PT Plan  Continue with current POC        AM-PAC PT 6 Clicks Mobility   Outcome Measure  Help needed turning from your back to your side while in a flat bed without using bedrails?: None Help needed moving from lying on your back to sitting on the side of a flat bed without using bedrails?: None Help needed moving to and from a bed to a chair (including a wheelchair)?: None Help needed standing up from a chair using your arms (e.g., wheelchair or bedside chair)?: A Little Help needed to walk in hospital room?: A Little Help needed climbing 3-5 steps with a railing? : A Little 6 Click Score: 21    End of Session Equipment Utilized During Treatment: Gait belt Activity Tolerance: Patient tolerated treatment well Patient left: in bed;with call bell/phone within reach;with bed alarm set;with family/visitor present Nurse Communication: Mobility status PT Visit Diagnosis: Other abnormalities of gait and mobility (R26.89);Muscle weakness (generalized) (M62.81);Pain Pain - Right/Left: Left Pain - part of body: Hip     Time: 8363-8340 PT Time Calculation (min) (ACUTE ONLY): 23 min  Charges:    $Therapeutic Activity: 23-37 mins PT General Charges $$ ACUTE PT VISIT: 1 Visit                     Dorothyann Maier, DPT, CLT  Acute Rehabilitation Services Office: [phone] (Secure chat preferred)    Dorothyann VEAR Maier 05/11/2024, 5:01 PM

## 2024-05-11 NOTE — TOC Progression Note (Signed)
 Transition of Care [facility]) - Progression Note    Patient Details  Name: Veronica Romero MRN: [id number] Date of Birth: 1951/03/01  Transition [facility]) CM/SW Contact  Rosalva Jon Bloch, RN Phone Number: 05/11/2024, 4:52 PM  Clinical Narrative:    Pt agreeable to home health and DME needs. Pt without provider preference. Referral made with [facility] and accepted. Referral made with [facility] for DME: RW and BSC. Equipment will be delivered to bedside prior to discharge.  ICM team following and will assist with needs...   Expected Discharge Plan: Home w Home Health Services Barriers to Discharge: Continued Medical Work up               Expected Discharge Plan and Services In-house Referral: Clinical Social Work Discharge Planning Services: CM Consult Post Acute Care Choice: Home Health Living arrangements for the past 2 months: Single Family Home                 DME Arranged: Bedside commode, Walker rolling   Date DME Agency Contacted: 05/11/24 Time DME Agency Contacted: 1645 Representative spoke with at DME Agency: [place] HH Arranged: PT, OT, Nurse's Aide, Social Work [facility] Agency: [facility] ([facility]) Date HH Agency Contacted: 05/11/24 Time HH Agency Contacted: 1647 Representative spoke with at [facility] Agency: [facility]   Social Drivers of Health (SDOH) Interventions SDOH Screenings   Food Insecurity: No Food Insecurity (05/07/2024)  Housing: Low Risk (05/08/2024)  Transportation Needs: No Transportation Needs (05/08/2024)  Utilities: Not At Risk (05/08/2024)  Alcohol  Screen: Low Risk (12/16/2023)  Depression (PHQ2-9): Low Risk (01/20/2024)  Recent Concern: Depression (PHQ2-9) - Medium Risk (12/17/2023)  Financial Resource Strain: Medium Risk (12/16/2023)  Physical Activity: Insufficiently Active (12/16/2023)  Social Connections: Socially Isolated (05/08/2024)  Stress: Stress Concern Present (12/16/2023)  Tobacco Use: Low Risk (05/08/2024)   Health Literacy: Adequate Health Literacy (10/31/2023)    Readmission Risk Interventions     No data to display

## 2024-05-11 NOTE — Progress Notes (Signed)
 Orthopaedic Trauma Progress Note  SUBJECTIVE: Doing okay this morning.  Pain controlled.  Has been able to mobilize short distances in the room over the weekend with therapies.  Denies any numbness or tingling throughout the left lower extremity.  No chest pain. No SOB. No nausea/vomiting. No other complaints.  [place] does plan to stay with her at discharge.  After speaking with her family, patient has hide she would like to discharge home versus a SNF.  OBJECTIVE:  Vitals:   05/10/24 1935 05/11/24 0413  BP: (!) 112/47 (!) 108/41  Pulse: 91 87  Resp: 17 16  Temp: 98.2 F (36.8 C) 98.3 F (36.8 C)  SpO2: 96% 96%    Opiates Today (MME): Today's  total administered Morphine  Milligram Equivalents: 0 Opiates Yesterday (MME): Yesterday's total administered Morphine  Milligram Equivalents: 10  General: Resting comfortably in bed.  No acute distress. Respiratory: No increased work of breathing.  Operative Extremity (LLE): Aquacel dressing to the anterior hip is clean, dry, intact.  Motor and sensory function intact distally.  No calf tenderness.  Ankle DF/PF intact.  Neurovascularly intact.  Warm and well-perfused.  IMAGING: Stable post op imaging left hip  LABS:  Results for orders placed or performed during the hospital encounter of 05/07/24 (from the past 24 hours)  Glucose, capillary     Status: Abnormal   Collection Time: 05/10/24 11:29 AM  Result Value Ref Range   Glucose-Capillary 203 (H) 70 - 99 mg/dL  Glucose, capillary     Status: Abnormal   Collection Time: 05/10/24  4:13 PM  Result Value Ref Range   Glucose-Capillary 100 (H) 70 - 99 mg/dL  Glucose, capillary     Status: Abnormal   Collection Time: 05/10/24  9:00 PM  Result Value Ref Range   Glucose-Capillary 106 (H) 70 - 99 mg/dL  Glucose, capillary     Status: None   Collection Time: 05/11/24  6:13 AM  Result Value Ref Range   Glucose-Capillary 89 70 - 99 mg/dL  CBC     Status: Abnormal    Collection Time: 05/11/24  7:20 AM  Result Value Ref Range   WBC 11.6 (H) 4.0 - 10.5 K/uL   RBC 2.71 (L) 3.87 - 5.11 MIL/uL   Hemoglobin 8.8 (L) 12.0 - 15.0 g/dL   HCT 74.2 (L) 63.9 - 53.9 %   MCV 94.8 80.0 - 100.0 fL   MCH 32.5 26.0 - 34.0 pg   MCHC 34.2 30.0 - 36.0 g/dL   RDW 83.1 (H) 88.4 - 84.4 %   Platelets 209 150 - 400 K/uL   nRBC 0.0 0.0 - 0.2 %  Basic metabolic panel     Status: Abnormal   Collection Time: 05/11/24  7:20 AM  Result Value Ref Range   Sodium 137 135 - 145 mmol/L   Potassium 3.8 3.5 - 5.1 mmol/L   Chloride 101 98 - 111 mmol/L   CO2 28 22 - 32 mmol/L   Glucose, Bld 95 70 - 99 mg/dL   BUN 15 8 - 23 mg/dL   Creatinine, Ser 9.18 0.44 - 1.00 mg/dL   Calcium  8.6 (L) 8.9 - 10.3 mg/dL   GFR, Estimated >39 >39 mL/min   Anion gap 8 5 - 15    ASSESSMENT: Veronica Romero is a 74 y.o. female, 3 Days Post-Op s/p fall at home Procedures: LEFT TOTAL HIP ARTHROPLASTY FOR FEMORAL NECK FRACTURE, ANTERIOR APPROACH  CV/Blood loss: Acute blood loss anemia, Hgb 8.8  this AM. Hemodynamically stable  PLAN: Weightbearing: WBAT LLE ROM: Unrestricted ROM.  No hip precautions needed Incisional and dressing care: Dressings left intact until follow-up  Showering: Okay to shower.  Aquacel dressing may get wet Orthopedic device(s): None  Pain management: Continue current regimen VTE prophylaxis: Aspirin  and Plavix , SCDs ID:  Ancef  2gm post op completed Foley/Lines:  No foley, KVO IVFs Bone health: Vitamin D  level 68 when checked in March 2025.  Continue home dose supplementation Dispo: PT/OT evaluation ongoing, recommending SNF versus home health.  Patient has elected for discharge home.  Ortho issues stable.  Okay for discharge from ortho standpoint once cleared by medicine team and therapies  D/C recommendations: - Norco, Tylenol , Robaxin  for pain control - Home dose aspirin  and Plavix  for DVT prophylaxis - Continue home dose Vit D supplementation  Follow - up plan: 2  weeks after d/c for wound check and repeat x-rays   Contact information:  Franky Light MD, Lauraine Moores PA-C. After hours and holidays please check [email or web address] for group call information for [facility]   Lauraine PATRIC Moores, PA-C [phone] (office) [email or web address]

## 2024-05-11 NOTE — Telephone Encounter (Signed)
 Called pt to resched her past appt pt agreed on the date of 05/29/24

## 2024-05-11 NOTE — Plan of Care (Signed)
   Problem: Education: Goal: Knowledge of General Education information will improve Description: Including pain rating scale, medication(s)/side effects and non-pharmacologic comfort measures Outcome: Progressing   Problem: Activity: Goal: Risk for activity intolerance will decrease Outcome: Progressing

## 2024-05-11 NOTE — Plan of Care (Signed)
   Problem: Health Behavior/Discharge Planning: Goal: Ability to manage health-related needs will improve Outcome: Progressing   Problem: Clinical Measurements: Goal: Ability to maintain clinical measurements within normal limits will improve Outcome: Progressing Goal: Will remain free from infection Outcome: Progressing

## 2024-05-12 MED ORDER — DOCUSATE SODIUM 100 MG PO CAPS: 100.0000 mg | ORAL_CAPSULE | Freq: Two times a day (BID) | ORAL | 0 refills | Status: DC

## 2024-05-12 MED ORDER — ACETAMINOPHEN 325 MG PO TABS: 650.0000 mg | ORAL_TABLET | Freq: Three times a day (TID) | ORAL | 0 refills | Status: AC

## 2024-05-12 MED ORDER — SENNA 8.6 MG PO TABS: 1.0000 | ORAL_TABLET | Freq: Two times a day (BID) | ORAL | 0 refills | Status: DC

## 2024-05-12 MED ORDER — POLYETHYLENE GLYCOL 3350 17 G PO PACK: 17.0000 g | PACK | Freq: Every day | ORAL | 0 refills | Status: DC

## 2024-05-12 MED ORDER — HYDROCODONE-ACETAMINOPHEN 5-325 MG PO TABS: 1.0000 | ORAL_TABLET | Freq: Four times a day (QID) | ORAL | 0 refills | Status: AC | PRN

## 2024-05-12 MED ORDER — TRAZODONE HCL 50 MG PO TABS: 25.0000 mg | ORAL_TABLET | Freq: Every evening | ORAL | 0 refills | Status: DC | PRN

## 2024-05-12 NOTE — Discharge Summary (Signed)
 " Physician Discharge Summary   Patient: Veronica Romero MRN: [id number] DOB: [birth year]  Admit date:     05/07/2024  Discharge date: 05/12/2024  Discharge Physician: Owen DELENA Lore   PCP: Melvin Pao, NP   Recommendations at discharge:   Needs CBC to follow hb level Needs Follow up with Ortho post surgery.   Discharge Diagnoses: Principal Problem:   Hip fracture (HCC)  Resolved Problems:   * No resolved hospital problems. *  Hospital Course: Veronica Romero is a 74 y.o. female with medical history significant of ischemic cardiomyopathy last ejection fraction 45 to 50% 05/07/2023, diabetes, ILD, hypertension, CAD, pulmonary hypertension, rheumatoid arthritis, who presented after a mechanical fall. Patient was found to have Subcapital left femoral neck fracture with varus angulation and shortening of the femoral neck .   Assessment and Plan:  1-Subcapital left femoral neck fracture; - Patient presented after a mechanical fall found to have femoral neck fracture. - Orthopedic consulted. - EKG: and pro-BNP: 365, no evidence of HF exacerbation.  -Continue with laxatives.  -Patient underwent left total hip arthroplasty of the left femoral neck fracture on 1/9 by Dr. Kendal.  Patient wants to discharge Home. Plan for discharge today    Acute blood loss anemia; post op , expected.  She does complaining  of dizziness and dyspnea on exertion.  Hb down to 7.6 from 11---Received  one unit of Blood due to symptoms and history Heart diseases.  Hb post transfusion increased to 8.6 Hb Stable.      Interstitial lung disease -Chest x-ray: Chronic appearing increased lung markings with mild left basilar atelectasis and/or infiltrate. - Not on home oxygen - No appear to be on any inhaler at home -PRN nebulizers   History of ischemic cardiomyopathy with improved ejection fraction - History of OCT guided complex atherectomy to ostial mid LAD with DES 3.0 x 30 mm in 06/2021.  She has  residual 50% stenosis in the mid RCA and distal LCx. - Patient denies chest pain, she is able to ambulate several blocks without getting shortness of breath or chest pain. -EKG: sinus rhythm, similar to EKG from 01/07/2024  and proBNP 360 -Continue with aspirin . Resume plavix  today, if ok by ortho Troponin negative I informed Dr Krystal of patient admission.    Diabetes type 2 - Not on medication.  Monitor CBG   Metabolic acidosis; Resolved.    Rheumatoid arthritis - On Humira and low-dose prednisone  -Continue chronic dose of prednisone  5 mg daily  per ortho ok to resume Humara and hold metotrexate.   Iron deficiency anemia: Continue folic acid  and B12 supplements.  Monitor hemoglobin   Leukocytosis: Suspect demargination.  Stress.  Increased again, denies cough, dysuria. Monitor.    Insomnia. PRN Trazodone  ordered.          Consultants: Ortho Procedures performed: None Disposition: Home Diet recommendation:  Cardiac diet DISCHARGE MEDICATION: Allergies as of 05/12/2024       Reactions   Penicillins Rash        Medication List     PAUSE taking these medications    methotrexate  50 MG/2ML injection Wait to take this until: May 27, 2024 Inject 1 mL (25 mg total) into the muscle every Saturday. Hold methotrexate  for 2 more weeks and then resume 3/10       STOP taking these medications    traMADol  50 MG tablet Commonly known as: ULTRAM        TAKE these medications    acetaminophen  325 MG  tablet Commonly known as: TYLENOL  Take 2 tablets (650 mg total) by mouth 3 (three) times daily for 10 days.   aspirin  EC 81 MG tablet Take 1 tablet (81 mg total) by mouth daily. Swallow whole.   clopidogrel  75 MG tablet Commonly known as: PLAVIX  Take 1 tablet (75 mg total) by mouth daily.   cyanocobalamin  1000 MCG tablet Commonly known as: VITAMIN B12 Take 1,000 mcg by mouth daily.   diclofenac Sodium 1 % Gel Commonly known as: VOLTAREN Apply 2 g topically.  OTC What changed:  how to take this when to take this reasons to take this   docusate sodium  100 MG capsule Commonly known as: COLACE Take 1 capsule (100 mg total) by mouth 2 (two) times daily.   folic acid  800 MCG tablet Commonly known as: FOLVITE  Take 800 mcg by mouth daily.   Humira (2 Pen) 40 MG/0.4ML pen Generic drug: adalimumab Inject 0.4 mLs into the skin every Tuesday. Tuesday   HYDROcodone -acetaminophen  5-325 MG tablet Commonly known as: NORCO/VICODIN Take 1-2 tablets by mouth every 6 (six) hours as needed for moderate pain (pain score 4-6) or severe pain (pain score 7-10).   leucovorin 5 MG tablet Commonly known as: WELLCOVORIN Take 15 mg by mouth every Saturday.   Lubricant Eye Drops 0.4-0.3 % Soln Generic drug: Polyethyl Glycol-Propyl Glycol Place 1 drop into both eyes 3 (three) times daily as needed (dry/irritated eyes.).   multivitamin with minerals Tabs tablet Take 1 tablet by mouth daily.   nitroGLYCERIN  0.4 MG SL tablet Commonly known as: Nitrostat  Place 1 tablet (0.4 mg total) under the tongue every 5 (five) minutes as needed.   omeprazole  40 MG capsule Commonly known as: PRILOSEC Take 1 capsule (40 mg total) by mouth daily.   polyethylene glycol 17 g packet Commonly known as: MIRALAX  / GLYCOLAX  Take 17 g by mouth daily.   predniSONE  5 MG tablet Commonly known as: DELTASONE  Take 10 mg by mouth in the morning.   rosuvastatin  40 MG tablet Commonly known as: CRESTOR  Take 1 tablet (40 mg total) by mouth daily.   senna 8.6 MG Tabs tablet Commonly known as: SENOKOT Take 1 tablet (8.6 mg total) by mouth 2 (two) times daily.   traZODone  50 MG tablet Commonly known as: DESYREL  Take 0.5 tablets (25 mg total) by mouth at bedtime as needed for sleep.   VITAMIN D3 PO Take 1 tablet by mouth daily.               Durable Medical Equipment  (From admission, onward)           Start     Ordered   05/11/24 0920  For home use only DME  Walker rolling  Once       Question Answer Comment  Walker: With 5 Inch Wheels   Patient needs a walker to treat with the following condition Balance disorder      05/11/24 0919   05/11/24 0919  For home use only DME 3 n 1  Once        05/11/24 0918   05/11/24 0919  For home use only DME Bedside commode  Once       Question:  Patient needs a bedside commode to treat with the following condition  Answer:  Balance disorder   05/11/24 9081            Follow-up Information     Haddix, Franky SQUIBB, MD. Schedule an appointment as soon as possible for a visit  in 2 week(s).   Specialty: Orthopedic Surgery Why: For wound check and repeat x-rays Contact information: [address] [place] [address] [phone]         Melvin Pao, NP Follow up in 1 week(s).   Specialty: Nurse Practitioner Contact information: [address] [place] [address] [phone]         [facility]) Follow up.   Specialty: Home Health Services Why: home health services will be provided by [facility]. Start of care within 48 hours post discharge. Contact information: [address] [place]  [address] [phone]               Discharge Exam: Veronica Romero   05/07/24 1350 05/08/24 0903  Weight: 72.6 kg (P) 72.6 kg   General; NAD  Condition at discharge: stable  The results of significant diagnostics from this hospitalization (including imaging, microbiology, ancillary and laboratory) are listed below for reference.   Imaging Studies: DG HIP UNILAT W OR W/O PELVIS 2-3 VIEWS LEFT Result Date: 05/08/2024 CLINICAL DATA:  Status post left hip arthroplasty. EXAM: DG HIP (WITH OR WITHOUT PELVIS) 2-3V LEFT COMPARISON:  Preoperative imaging FINDINGS: Left hip arthroplasty in expected alignment. No periprosthetic lucency or fracture. Recent postsurgical change includes air and edema in the soft tissues.  IMPRESSION: Left hip arthroplasty without immediate postoperative complication. Electronically Signed   By: Andrea Gasman M.D.   On: 05/08/2024 15:46   DG HIP UNILAT WITH PELVIS 1V LEFT Result Date: 05/08/2024 CLINICAL DATA:  Elective surgery EXAM: DG HIP (WITH OR WITHOUT PELVIS) 1V*L* COMPARISON:  Radiograph yesterday FINDINGS: Ten fluoroscopic spot views of the pelvis and left hip obtained in the operating room. Sequential images during hip arthroplasty. Fluoroscopy time 26.4 seconds. Dose 2.0657 mGy. IMPRESSION: Intraoperative fluoroscopy during left hip arthroplasty. Electronically Signed   By: Andrea Gasman M.D.   On: 05/08/2024 15:45   DG C-Arm 1-60 Min-No Report Result Date: 05/08/2024 Fluoroscopy was utilized by the requesting physician.  No radiographic interpretation.   DG C-Arm 1-60 Min-No Report Result Date: 05/08/2024 Fluoroscopy was utilized by the requesting physician.  No radiographic interpretation.   DG C-Arm 1-60 Min-No Report Result Date: 05/08/2024 Fluoroscopy was utilized by the requesting physician.  No radiographic interpretation.   DG Chest 2 View Result Date: 05/07/2024 CLINICAL DATA:  Status post fall. EXAM: CHEST - 2 VIEW COMPARISON:  January 07, 2024 FINDINGS: The cardiac silhouette is borderline in size and unchanged in appearance. Chronic appearing increased lung markings are seen bilaterally with mild atelectasis and/or infiltrate noted within the left lung base. No pleural effusion or pneumothorax is identified. There is a small to moderate sized hiatal hernia. No acute osseous abnormalities are identified. IMPRESSION: 1. Chronic appearing increased lung markings with mild left basilar atelectasis and/or infiltrate. 2. Small to moderate sized hiatal hernia. Electronically Signed   By: Suzen Dials M.D.   On: 05/07/2024 16:04   CT Cervical Spine Wo Contrast Result Date: 05/07/2024 CLINICAL DATA:  Fall with head/neck trauma. EXAM: CT HEAD WITHOUT CONTRAST CT  CERVICAL SPINE WITHOUT CONTRAST TECHNIQUE: Multidetector CT imaging of the head and cervical spine was performed following the standard protocol without intravenous contrast. Multiplanar CT image reconstructions of the cervical spine were also generated. RADIATION DOSE REDUCTION: This exam was performed according to the departmental dose-optimization program which includes automated exposure control, adjustment of the mA and/or kV according to patient size and/or use of iterative  reconstruction technique. COMPARISON:  None Available. FINDINGS: CT HEAD FINDINGS Brain: Ventricles, cisterns and other CSF spaces are normal. There is no mass, mass effect, shift of midline structures or acute hemorrhage. Minimal chronic ischemic microvascular disease is present. No acute infarction. Vascular: No hyperdense vessel or unexpected calcification. Skull: Normal. Negative for fracture or focal lesion. Sinuses/Orbits: Orbits are normal. Paranasal sinuses are well developed. There is opacification over the right maxillary sinus. Other: None. CT CERVICAL SPINE FINDINGS Alignment: Normal. Skull base and vertebrae: Mild spondylosis throughout the cervical spine to include uncovertebral joint spurring and facet arthropathy. Vertebral body heights are maintained. No acute fracture. Right-sided neural foraminal narrowing at the C3-4 level. Minimal left-sided neural from narrowing at the C4-5 level. Mild right-sided neural foraminal narrowing at the C5-6 level. Soft tissues and spinal canal: Prevertebral soft tissues are normal. No canal stenosis. Disc levels:  Minimal disc space narrowing at the C5-6 level. Upper chest: No acute findings. Other: None. IMPRESSION: 1. No acute brain injury. 2. Minimal chronic ischemic microvascular disease. 3. No acute cervical spine injury. 4. Mild spondylosis of the cervical spine with minimal disc disease at the C5-6 level. 5. Right maxillary sinus inflammatory disease. Electronically Signed   By:  Toribio Agreste M.D.   On: 05/07/2024 15:57   DG Hip Unilat W or Wo Pelvis 2-3 Views Left Result Date: 05/07/2024 EXAM: 2 or more VIEW(S) XRAY OF THE UNILATERAL HIP 05/07/2024 02:48:00 PM COMPARISON: None available. CLINICAL HISTORY: fall pain FINDINGS: BONES AND JOINTS: Subcapital left femoral neck fracture with varus angulation and shortening of the femoral neck. SOFT TISSUES: Vascular calcifications in pelvis. IMPRESSION: 1. Subcapital left femoral neck fracture with varus angulation and shortening of the femoral neck. Electronically signed by: Norleen Boxer MD MD 05/07/2024 03:32 PM EST RP Workstation: [id number]    Microbiology: Results for orders placed or performed during the hospital encounter of 05/07/24  Surgical PCR screen     Status: None   Collection Time: 05/07/24  8:31 PM   Specimen: Nasal Mucosa; Nasal Swab  Result Value Ref Range Status   MRSA, PCR NEGATIVE NEGATIVE Final   Staphylococcus aureus NEGATIVE NEGATIVE Final    Comment: (NOTE) The Xpert SA Assay (FDA approved for NASAL specimens in patients 35 years of age and older), is one component of a comprehensive surveillance program. It is not intended to diagnose infection nor to guide or monitor treatment. Performed at [facility], [address] [place], [place] [address]     Labs: CBC: Recent Labs  Lab 05/07/24 1510 05/08/24 0554 05/09/24 0542 05/09/24 1612 05/10/24 0659 05/11/24 0720  WBC 14.5* 10.2 12.7* 14.5* 14.2* 11.6*  NEUTROABS 10.5*  --   --   --   --   --   HGB 12.2 11.6* 7.6* 8.8* 8.6* 8.8*  HCT 37.0 33.6* 22.2* 25.5* 24.1* 25.7*  MCV 99.7 94.9 98.2 93.8 93.4 94.8  PLT 229 207 107* 143* 149* 209   Basic Metabolic Panel: Recent Labs  Lab 05/07/24 1510 05/07/24 2021 05/08/24 0554 05/08/24 0819 05/09/24 0542 05/10/24 0659 05/11/24 0720  NA 137  --  136  --  132* 138 137  K 4.1  --  3.8  --  4.5 3.7 3.8  CL 104  --  102  --  102 103 101  CO2 17*  --  24  --  20* 27 28  GLUCOSE  100*  --  120*  --  143* 94 95  BUN 23  --  14  --  18 20 15   CREATININE 0.73 0.73 0.74  --  0.76 0.85 0.81  CALCIUM  9.0  --  8.9  --  8.2* 8.3* 8.6*  MG  --   --   --  2.0  --   --   --    Liver Function Tests: No results for input(s): AST, ALT, ALKPHOS, BILITOT, PROT, ALBUMIN  in the last 168 hours. CBG: Recent Labs  Lab 05/10/24 1613 05/10/24 2100 05/11/24 0613 05/11/24 1137 05/11/24 1628  GLUCAP 100* 106* 89 114* 114*    Discharge time spent: greater than 30 minutes.  Signed: Owen DELENA Lore, MD [facility] 05/12/2024 "

## 2024-05-12 NOTE — Plan of Care (Signed)
" °  Problem: Education: Goal: Knowledge of General Education information will improve Description: Including pain rating scale, medication(s)/side effects and non-pharmacologic comfort measures Outcome: Adequate for Discharge   Problem: Health Behavior/Discharge Planning: Goal: Ability to manage health-related needs will improve Outcome: Adequate for Discharge   Problem: Clinical Measurements: Goal: Ability to maintain clinical measurements within normal limits will improve Outcome: Adequate for Discharge Goal: Will remain free from infection Outcome: Adequate for Discharge Goal: Diagnostic test results will improve Outcome: Adequate for Discharge Goal: Respiratory complications will improve Outcome: Adequate for Discharge Goal: Cardiovascular complication will be avoided Outcome: Adequate for Discharge   Problem: Activity: Goal: Risk for activity intolerance will decrease Outcome: Adequate for Discharge   Problem: Nutrition: Goal: Adequate nutrition will be maintained Outcome: Adequate for Discharge   Problem: Coping: Goal: Level of anxiety will decrease Outcome: Adequate for Discharge   Problem: Elimination: Goal: Will not experience complications related to bowel motility Outcome: Adequate for Discharge Goal: Will not experience complications related to urinary retention Outcome: Adequate for Discharge   Problem: Pain Managment: Goal: General experience of comfort will improve and/or be controlled Outcome: Adequate for Discharge   Problem: Safety: Goal: Ability to remain free from injury will improve Outcome: Adequate for Discharge   Problem: Skin Integrity: Goal: Risk for impaired skin integrity will decrease Outcome: Adequate for Discharge   Problem: Education: Goal: Knowledge of the prescribed therapeutic regimen will improve Outcome: Adequate for Discharge   Problem: Bowel/Gastric: Goal: Gastrointestinal status for postoperative course will  improve Outcome: Adequate for Discharge   Problem: Cardiac: Goal: Ability to maintain an adequate cardiac output Outcome: Adequate for Discharge Goal: Will show no evidence of cardiac arrhythmias Outcome: Adequate for Discharge   Problem: Nutritional: Goal: Will attain and maintain optimal nutritional status Outcome: Adequate for Discharge   Problem: Neurological: Goal: Will regain or maintain usual level of consciousness Outcome: Adequate for Discharge   Problem: Clinical Measurements: Goal: Ability to maintain clinical measurements within normal limits Outcome: Adequate for Discharge Goal: Postoperative complications will be avoided or minimized Outcome: Adequate for Discharge   Problem: Respiratory: Goal: Will regain and/or maintain adequate ventilation Outcome: Adequate for Discharge Goal: Respiratory status will improve Outcome: Adequate for Discharge   Problem: Skin Integrity: Goal: Demonstrates signs of wound healing without infection Outcome: Adequate for Discharge   Problem: Urinary Elimination: Goal: Will remain free from infection Outcome: Adequate for Discharge Goal: Ability to achieve and maintain adequate urine output Outcome: Adequate for Discharge   Problem: Education: Goal: Knowledge of the prescribed therapeutic regimen will improve Outcome: Adequate for Discharge Goal: Understanding of discharge needs will improve Outcome: Adequate for Discharge Goal: Individualized Educational Video(s) Outcome: Adequate for Discharge   Problem: Activity: Goal: Ability to avoid complications of mobility impairment will improve Outcome: Adequate for Discharge Goal: Ability to tolerate increased activity will improve Outcome: Adequate for Discharge   Problem: Clinical Measurements: Goal: Postoperative complications will be avoided or minimized Outcome: Adequate for Discharge   Problem: Pain Management: Goal: Pain level will decrease with appropriate  interventions Outcome: Adequate for Discharge   Problem: Skin Integrity: Goal: Will show signs of wound healing Outcome: Adequate for Discharge   "

## 2024-05-12 NOTE — Plan of Care (Signed)
" °  Problem: Pain Managment: Goal: General experience of comfort will improve and/or be controlled Outcome: Progressing   Problem: Safety: Goal: Ability to remain free from injury will improve Outcome: Progressing   Problem: Bowel/Gastric: Goal: Gastrointestinal status for postoperative course will improve Outcome: Progressing   Problem: Clinical Measurements: Goal: Postoperative complications will be avoided or minimized Outcome: Progressing   Problem: Urinary Elimination: Goal: Will remain free from infection Outcome: Progressing   "

## 2024-05-20 ENCOUNTER — Telehealth: Payer: Self-pay

## 2024-05-20 NOTE — Telephone Encounter (Signed)
 I followed up with Veronica Romero from [facility], phone number [phone] to inform her the provider did okay the verbal orders

## 2024-05-20 NOTE — Telephone Encounter (Signed)
 Okay for verbal orders.

## 2024-05-20 NOTE — Telephone Encounter (Signed)
 Copied from CRM [id number]. Topic: Clinical - Home Health Verbal Orders >> May 19, 2024 12:37 PM Gattis SQUIBB wrote: Caller/Agency: [facility]  Callback Number: [phone]  Service Requested: Physical Therapy Frequency: 1 week 1, 2 week 2. And 1 week 6  Any new concerns about the patient? No

## 2024-05-26 ENCOUNTER — Ambulatory Visit: Admitting: Nurse Practitioner

## 2024-05-26 ENCOUNTER — Encounter: Payer: Self-pay | Admitting: Nurse Practitioner

## 2024-05-26 VITALS — BP 148/67 | HR 97 | Temp 98.5°F | Ht 67.01 in | Wt 167.8 lb

## 2024-05-26 DIAGNOSIS — Z09 Encounter for follow-up examination after completed treatment for conditions other than malignant neoplasm: Secondary | ICD-10-CM | POA: Diagnosis not present

## 2024-05-26 DIAGNOSIS — D649 Anemia, unspecified: Secondary | ICD-10-CM | POA: Diagnosis not present

## 2024-05-26 MED ORDER — TRAZODONE HCL 50 MG PO TABS: 25.0000 mg | ORAL_TABLET | Freq: Every evening | ORAL | 1 refills | Status: AC | PRN

## 2024-05-26 MED ORDER — DICLOFENAC SODIUM 1 % EX GEL: 2.0000 g | Freq: Four times a day (QID) | CUTANEOUS | 1 refills | Status: AC

## 2024-05-26 NOTE — Progress Notes (Signed)
 "  BP (!) 148/67 (BP Location: Right Arm, Cuff Size: Normal)   Pulse 97   Temp 98.5 F (36.9 C) (Oral)   Ht 5' 7.01 (1.702 m)   Wt 167 lb 12.8 oz (76.1 kg)   SpO2 97%   BMI 26.27 kg/m    Subjective:    Patient ID: Veronica Romero, female    DOB: [birth year], 74 y.o.   MRN: [id number]  HPI: RABECCA Romero is a 74 y.o. female  Chief Complaint  Patient presents with   Hospitalization Follow-up   Transition of Care Hospital Follow up.   Hospital/Facility: [facility] D/C Physician: Dr. Madelyne D/C Date: 05/12/24  Records Requested: NA Records Received: NA Records Reviewed: Yes  Diagnoses on Discharge:   Patient states she is doing well.  She can take a few steps without the walker.  Patient was supposed to follow up with the surgeon this morning but it had to be rescheduled.  She will be following up with Oncology on 1/30.   1-Subcapital left femoral neck fracture; - Patient presented after a mechanical fall found to have femoral neck fracture. - Orthopedic consulted. - EKG: and pro-BNP: 365, no evidence of HF exacerbation.  -Continue with laxatives.  -Patient underwent left total hip arthroplasty of the left femoral neck fracture on 1/9 by Dr. Kendal.  Patient wants to discharge Home. Plan for discharge today    Acute blood loss anemia; post op , expected.  She does complaining  of dizziness and dyspnea on exertion.  Hb down to 7.6 from 11---Received  one unit of Blood due to symptoms and history Heart diseases.  Hb post transfusion increased to 8.6 Hb Stable.      Interstitial lung disease -Chest x-ray: Chronic appearing increased lung markings with mild left basilar atelectasis and/or infiltrate. - Not on home oxygen - No appear to be on any inhaler at home -PRN nebulizers   History of ischemic cardiomyopathy with improved ejection fraction - History of OCT guided complex atherectomy to ostial mid LAD with DES 3.0 x 30 mm in 06/2021.  She has residual 50% stenosis in  the mid RCA and distal LCx. - Patient denies chest pain, she is able to ambulate several blocks without getting shortness of breath or chest pain. -EKG: sinus rhythm, similar to EKG from 01/07/2024  and proBNP 360 -Continue with aspirin . Resume plavix  today, if ok by ortho Troponin negative I informed Dr Krystal of patient admission.    Diabetes type 2 - Not on medication.  Monitor CBG   Metabolic acidosis; Resolved.    Rheumatoid arthritis - On Humira and low-dose prednisone  -Continue chronic dose of prednisone  5 mg daily  per ortho ok to resume Humara and hold metotrexate.    Iron deficiency anemia: Continue folic acid  and B12 supplements.  Monitor hemoglobin   Leukocytosis: Suspect demargination.  Stress.  Increased again, denies cough, dysuria. Monitor.    Insomnia. PRN Trazodone  ordered.   Date of interactive Contact within 48 hours of discharge:  Contact was through: none  Date of 7 day or 14 day face-to-face visit:    within 14 days  Outpatient Encounter Medications as of 05/26/2024  Medication Sig   Adalimumab (HUMIRA PEN) 40 MG/0.4ML PNKT Inject 0.4 mLs into the skin every Tuesday. Tuesday   aspirin  EC 81 MG tablet Take 1 tablet (81 mg total) by mouth daily. Swallow whole.   Cholecalciferol  (VITAMIN D3 PO) Take 1 tablet by mouth daily.   clopidogrel  (PLAVIX ) 75 MG tablet  Take 1 tablet (75 mg total) by mouth daily.   cyanocobalamin  (VITAMIN B12) 1000 MCG tablet Take 1,000 mcg by mouth daily.   diclofenac  Sodium (VOLTAREN ) 1 % GEL Apply 2 g topically. OTC (Patient taking differently: Apply 2 g topically as needed. OTC)   docusate sodium  (COLACE) 100 MG capsule Take 1 capsule (100 mg total) by mouth 2 (two) times daily.   folic acid  (FOLVITE ) 800 MCG tablet Take 800 mcg by mouth daily.   HYDROcodone -acetaminophen  (NORCO/VICODIN) 5-325 MG tablet Take 1-2 tablets by mouth every 6 (six) hours as needed for moderate pain (pain score 4-6) or severe pain (pain score 7-10).    leucovorin (WELLCOVORIN) 5 MG tablet Take 15 mg by mouth every Saturday.   [Paused] methotrexate  50 MG/2ML injection Inject 1 mL (25 mg total) into the muscle every Saturday. Hold methotrexate  for 2 more weeks and then resume 3/10   Multiple Vitamin (MULTIVITAMIN WITH MINERALS) TABS tablet Take 1 tablet by mouth daily.   nitroGLYCERIN  (NITROSTAT ) 0.4 MG SL tablet Place 1 tablet (0.4 mg total) under the tongue every 5 (five) minutes as needed.   omeprazole  (PRILOSEC) 40 MG capsule Take 1 capsule (40 mg total) by mouth daily.   Polyethyl Glycol-Propyl Glycol (LUBRICANT EYE DROPS) 0.4-0.3 % SOLN Place 1 drop into both eyes 3 (three) times daily as needed (dry/irritated eyes.).   polyethylene glycol (MIRALAX  / GLYCOLAX ) 17 g packet Take 17 g by mouth daily.   predniSONE  (DELTASONE ) 5 MG tablet Take 10 mg by mouth in the morning.   rosuvastatin  (CRESTOR ) 40 MG tablet Take 1 tablet (40 mg total) by mouth daily.   senna (SENOKOT) 8.6 MG TABS tablet Take 1 tablet (8.6 mg total) by mouth 2 (two) times daily.   traZODone  (DESYREL ) 50 MG tablet Take 0.5 tablets (25 mg total) by mouth at bedtime as needed for sleep.   No facility-administered encounter medications on file as of 05/26/2024.    Diagnostic Tests Reviewed/Disposition: Reviewed  Consults: Ortho  Discharge Instructions: reviewed  Disease/illness Education: Provided during visit  Home Health/Community Services Discussions/Referrals: Currently receiving home health  Establishment or re-establishment of referral orders for community resources: NA  Discussion with other health care providers: NA  Assessment and Support of treatment regimen adherence: Discussed with patient during visit  Appointments Coordinated with: Patient  Education for self-management, independent living, and ADLs: Working to be independent on ADLs.  Relevant past medical, surgical, family and social history reviewed and updated as indicated. Interim medical history  since our last visit reviewed. Allergies and medications reviewed and updated.  Review of Systems  Musculoskeletal:        Hip pain    Per HPI unless specifically indicated above     Objective:    BP (!) 148/67 (BP Location: Right Arm, Cuff Size: Normal)   Pulse 97   Temp 98.5 F (36.9 C) (Oral)   Ht 5' 7.01 (1.702 m)   Wt 167 lb 12.8 oz (76.1 kg)   SpO2 97%   BMI 26.27 kg/m   Wt Readings from Last 3 Encounters:  05/26/24 167 lb 12.8 oz (76.1 kg)  05/08/24 (P) 160 lb (72.6 kg)  03/05/24 161 lb (73 kg)    Physical Exam Vitals and nursing note reviewed.  Constitutional:      General: She is not in acute distress.    Appearance: Normal appearance. She is normal weight. She is not ill-appearing, toxic-appearing or diaphoretic.  HENT:     Head: Normocephalic.  Right Ear: External ear normal.     Left Ear: External ear normal.     Nose: Nose normal.     Mouth/Throat:     Mouth: Mucous membranes are moist.     Pharynx: Oropharynx is clear.  Eyes:     General:        Right eye: No discharge.        Left eye: No discharge.     Extraocular Movements: Extraocular movements intact.     Conjunctiva/sclera: Conjunctivae normal.     Pupils: Pupils are equal, round, and reactive to light.  Cardiovascular:     Rate and Rhythm: Normal rate and regular rhythm.     Heart sounds: No murmur heard. Pulmonary:     Effort: Pulmonary effort is normal. No respiratory distress.     Breath sounds: Normal breath sounds. No wheezing or rales.  Musculoskeletal:     Cervical back: Normal range of motion and neck supple.  Skin:    General: Skin is warm and dry.     Capillary Refill: Capillary refill takes less than 2 seconds.  Neurological:     General: No focal deficit present.     Mental Status: She is alert and oriented to person, place, and time. Mental status is at baseline.  Psychiatric:        Mood and Affect: Mood normal.        Behavior: Behavior normal.        Thought  Content: Thought content normal.        Judgment: Judgment normal.     Results for orders placed or performed during the hospital encounter of 05/07/24  CBC with Differential   Collection Time: 05/07/24  3:10 PM  Result Value Ref Range   WBC 14.5 (H) 4.0 - 10.5 K/uL   RBC 3.71 (L) 3.87 - 5.11 MIL/uL   Hemoglobin 12.2 12.0 - 15.0 g/dL   HCT 62.9 63.9 - 53.9 %   MCV 99.7 80.0 - 100.0 fL   MCH 32.9 26.0 - 34.0 pg   MCHC 33.0 30.0 - 36.0 g/dL   RDW 84.3 (H) 88.4 - 84.4 %   Platelets 229 150 - 400 K/uL   nRBC 0.0 0.0 - 0.2 %   Neutrophils Relative % 72 %   Neutro Abs 10.5 (H) 1.7 - 7.7 K/uL   Lymphocytes Relative 19 %   Lymphs Abs 2.7 0.7 - 4.0 K/uL   Monocytes Relative 7 %   Monocytes Absolute 1.1 (H) 0.1 - 1.0 K/uL   Eosinophils Relative 1 %   Eosinophils Absolute 0.1 0.0 - 0.5 K/uL   Basophils Relative 0 %   Basophils Absolute 0.1 0.0 - 0.1 K/uL   Immature Granulocytes 1 %   Abs Immature Granulocytes 0.08 (H) 0.00 - 0.07 K/uL  Basic metabolic panel   Collection Time: 05/07/24  3:10 PM  Result Value Ref Range   Sodium 137 135 - 145 mmol/L   Potassium 4.1 3.5 - 5.1 mmol/L   Chloride 104 98 - 111 mmol/L   CO2 17 (L) 22 - 32 mmol/L   Glucose, Bld 100 (H) 70 - 99 mg/dL   BUN 23 8 - 23 mg/dL   Creatinine, Ser 9.26 0.44 - 1.00 mg/dL   Calcium  9.0 8.9 - 10.3 mg/dL   GFR, Estimated >39 >39 mL/min   Anion gap 16 (H) 5 - 15  Type and screen [facility]   Collection Time: 05/07/24  8:18 PM  Result Value Ref  Range   ABO/RH(D) A POS    Antibody Screen NEG    Sample Expiration 05/10/2024,2359    Unit Number [id number]    Blood Component Type RED CELLS,LR    Unit division 00    Status of Unit ISSUED,FINAL    Transfusion Status OK TO TRANSFUSE    Crossmatch Result      Compatible Performed at [facility], [address], [place], [place] [address]   BPAM [facility]   Collection Time: 05/07/24  8:18 PM  Result Value Ref Range   ISSUE DATE / TIME  [id number]    Blood Product Unit Number [id number]    PRODUCT CODE [id number]    Unit Type and Rh 6200    Blood Product Expiration Date 797398737640   Pro Brain natriuretic peptide   Collection Time: 05/07/24  8:21 PM  Result Value Ref Range   Pro Brain Natriuretic Peptide 365.0 (H) <300.0 pg/mL  Creatinine, serum   Collection Time: 05/07/24  8:21 PM  Result Value Ref Range   Creatinine, Ser 0.73 0.44 - 1.00 mg/dL   GFR, Estimated >39 >39 mL/min  Surgical PCR screen   Collection Time: 05/07/24  8:31 PM   Specimen: Nasal Mucosa; Nasal Swab  Result Value Ref Range   MRSA, PCR NEGATIVE NEGATIVE   Staphylococcus aureus NEGATIVE NEGATIVE  Glucose, capillary   Collection Time: 05/07/24  9:10 PM  Result Value Ref Range   Glucose-Capillary 125 (H) 70 - 99 mg/dL  CBC   Collection Time: 05/08/24  5:54 AM  Result Value Ref Range   WBC 10.2 4.0 - 10.5 K/uL   RBC 3.54 (L) 3.87 - 5.11 MIL/uL   Hemoglobin 11.6 (L) 12.0 - 15.0 g/dL   HCT 66.3 (L) 63.9 - 53.9 %   MCV 94.9 80.0 - 100.0 fL   MCH 32.8 26.0 - 34.0 pg   MCHC 34.5 30.0 - 36.0 g/dL   RDW 84.6 88.4 - 84.4 %   Platelets 207 150 - 400 K/uL   nRBC 0.0 0.0 - 0.2 %  Basic metabolic panel   Collection Time: 05/08/24  5:54 AM  Result Value Ref Range   Sodium 136 135 - 145 mmol/L   Potassium 3.8 3.5 - 5.1 mmol/L   Chloride 102 98 - 111 mmol/L   CO2 24 22 - 32 mmol/L   Glucose, Bld 120 (H) 70 - 99 mg/dL   BUN 14 8 - 23 mg/dL   Creatinine, Ser 9.25 0.44 - 1.00 mg/dL   Calcium  8.9 8.9 - 10.3 mg/dL   GFR, Estimated >39 >39 mL/min   Anion gap 10 5 - 15  Glucose, capillary   Collection Time: 05/08/24  6:04 AM  Result Value Ref Range   Glucose-Capillary 126 (H) 70 - 99 mg/dL  Magnesium    Collection Time: 05/08/24  8:19 AM  Result Value Ref Range   Magnesium  2.0 1.7 - 2.4 mg/dL  Troponin T, High Sensitivity   Collection Time: 05/08/24  8:19 AM  Result Value Ref Range   Troponin T High Sensitivity <15 0 - 19 ng/L  Glucose,  capillary   Collection Time: 05/08/24  9:02 AM  Result Value Ref Range   Glucose-Capillary 103 (H) 70 - 99 mg/dL  Glucose, capillary   Collection Time: 05/08/24  1:18 PM  Result Value Ref Range   Glucose-Capillary 146 (H) 70 - 99 mg/dL  Glucose, capillary   Collection Time: 05/08/24  4:59 PM  Result Value Ref Range   Glucose-Capillary 201 (H)  70 - 99 mg/dL  Glucose, capillary   Collection Time: 05/08/24 10:52 PM  Result Value Ref Range   Glucose-Capillary 183 (H) 70 - 99 mg/dL  CBC   Collection Time: 05/09/24  5:42 AM  Result Value Ref Range   WBC 12.7 (H) 4.0 - 10.5 K/uL   RBC 2.26 (L) 3.87 - 5.11 MIL/uL   Hemoglobin 7.6 (L) 12.0 - 15.0 g/dL   HCT 77.7 (L) 63.9 - 53.9 %   MCV 98.2 80.0 - 100.0 fL   MCH 33.6 26.0 - 34.0 pg   MCHC 34.2 30.0 - 36.0 g/dL   RDW 84.5 88.4 - 84.4 %   Platelets 107 (L) 150 - 400 K/uL   nRBC 0.0 0.0 - 0.2 %  Basic metabolic panel   Collection Time: 05/09/24  5:42 AM  Result Value Ref Range   Sodium 132 (L) 135 - 145 mmol/L   Potassium 4.5 3.5 - 5.1 mmol/L   Chloride 102 98 - 111 mmol/L   CO2 20 (L) 22 - 32 mmol/L   Glucose, Bld 143 (H) 70 - 99 mg/dL   BUN 18 8 - 23 mg/dL   Creatinine, Ser 9.23 0.44 - 1.00 mg/dL   Calcium  8.2 (L) 8.9 - 10.3 mg/dL   GFR, Estimated >39 >39 mL/min   Anion gap 10 5 - 15  Glucose, capillary   Collection Time: 05/09/24  6:52 AM  Result Value Ref Range   Glucose-Capillary 133 (H) 70 - 99 mg/dL  Troponin T, High Sensitivity   Collection Time: 05/09/24  7:57 AM  Result Value Ref Range   Troponin T High Sensitivity <15 0 - 19 ng/L  Prepare RBC (crossmatch)   Collection Time: 05/09/24  9:04 AM  Result Value Ref Range   Order Confirmation      ORDER PROCESSED BY BLOOD BANK Performed at [facility], [address], [place], [place] [address]   Glucose, capillary   Collection Time: 05/09/24 11:59 AM  Result Value Ref Range   Glucose-Capillary 125 (H) 70 - 99 mg/dL  CBC   Collection Time: 05/09/24  4:12  PM  Result Value Ref Range   WBC 14.5 (H) 4.0 - 10.5 K/uL   RBC 2.72 (L) 3.87 - 5.11 MIL/uL   Hemoglobin 8.8 (L) 12.0 - 15.0 g/dL   HCT 74.4 (L) 63.9 - 53.9 %   MCV 93.8 80.0 - 100.0 fL   MCH 32.4 26.0 - 34.0 pg   MCHC 34.5 30.0 - 36.0 g/dL   RDW 83.1 (H) 88.4 - 84.4 %   Platelets 143 (L) 150 - 400 K/uL   nRBC 0.0 0.0 - 0.2 %  Glucose, capillary   Collection Time: 05/09/24  4:20 PM  Result Value Ref Range   Glucose-Capillary 122 (H) 70 - 99 mg/dL  Glucose, capillary   Collection Time: 05/09/24 10:30 PM  Result Value Ref Range   Glucose-Capillary 108 (H) 70 - 99 mg/dL  Glucose, capillary   Collection Time: 05/10/24  6:39 AM  Result Value Ref Range   Glucose-Capillary 96 70 - 99 mg/dL  CBC   Collection Time: 05/10/24  6:59 AM  Result Value Ref Range   WBC 14.2 (H) 4.0 - 10.5 K/uL   RBC 2.58 (L) 3.87 - 5.11 MIL/uL   Hemoglobin 8.6 (L) 12.0 - 15.0 g/dL   HCT 75.8 (L) 63.9 - 53.9 %   MCV 93.4 80.0 - 100.0 fL   MCH 33.3 26.0 - 34.0 pg   MCHC 35.7 30.0 -  36.0 g/dL   RDW 82.5 (H) 88.4 - 84.4 %   Platelets 149 (L) 150 - 400 K/uL   nRBC 0.0 0.0 - 0.2 %  Basic metabolic panel   Collection Time: 05/10/24  6:59 AM  Result Value Ref Range   Sodium 138 135 - 145 mmol/L   Potassium 3.7 3.5 - 5.1 mmol/L   Chloride 103 98 - 111 mmol/L   CO2 27 22 - 32 mmol/L   Glucose, Bld 94 70 - 99 mg/dL   BUN 20 8 - 23 mg/dL   Creatinine, Ser 9.14 0.44 - 1.00 mg/dL   Calcium  8.3 (L) 8.9 - 10.3 mg/dL   GFR, Estimated >39 >39 mL/min   Anion gap 8 5 - 15  Glucose, capillary   Collection Time: 05/10/24 11:29 AM  Result Value Ref Range   Glucose-Capillary 203 (H) 70 - 99 mg/dL  Glucose, capillary   Collection Time: 05/10/24  4:13 PM  Result Value Ref Range   Glucose-Capillary 100 (H) 70 - 99 mg/dL  Glucose, capillary   Collection Time: 05/10/24  9:00 PM  Result Value Ref Range   Glucose-Capillary 106 (H) 70 - 99 mg/dL  Glucose, capillary   Collection Time: 05/11/24  6:13 AM  Result Value  Ref Range   Glucose-Capillary 89 70 - 99 mg/dL  CBC   Collection Time: 05/11/24  7:20 AM  Result Value Ref Range   WBC 11.6 (H) 4.0 - 10.5 K/uL   RBC 2.71 (L) 3.87 - 5.11 MIL/uL   Hemoglobin 8.8 (L) 12.0 - 15.0 g/dL   HCT 74.2 (L) 63.9 - 53.9 %   MCV 94.8 80.0 - 100.0 fL   MCH 32.5 26.0 - 34.0 pg   MCHC 34.2 30.0 - 36.0 g/dL   RDW 83.1 (H) 88.4 - 84.4 %   Platelets 209 150 - 400 K/uL   nRBC 0.0 0.0 - 0.2 %  Basic metabolic panel   Collection Time: 05/11/24  7:20 AM  Result Value Ref Range   Sodium 137 135 - 145 mmol/L   Potassium 3.8 3.5 - 5.1 mmol/L   Chloride 101 98 - 111 mmol/L   CO2 28 22 - 32 mmol/L   Glucose, Bld 95 70 - 99 mg/dL   BUN 15 8 - 23 mg/dL   Creatinine, Ser 9.18 0.44 - 1.00 mg/dL   Calcium  8.6 (L) 8.9 - 10.3 mg/dL   GFR, Estimated >39 >39 mL/min   Anion gap 8 5 - 15  Glucose, capillary   Collection Time: 05/11/24 11:37 AM  Result Value Ref Range   Glucose-Capillary 114 (H) 70 - 99 mg/dL  Glucose, capillary   Collection Time: 05/11/24  4:28 PM  Result Value Ref Range   Glucose-Capillary 114 (H) 70 - 99 mg/dL      Assessment & Plan:   Problem List Items Addressed This Visit   None Visit Diagnoses       Hospital discharge follow-up    -  Primary        Follow up plan: No follow-ups on file.      "

## 2024-05-26 NOTE — Assessment & Plan Note (Signed)
 Received PRBC.  Has an appt with Heme on 1/30.  CBC checked at visit today.

## 2024-05-27 ENCOUNTER — Ambulatory Visit: Payer: Self-pay | Admitting: Nurse Practitioner

## 2024-05-27 LAB — CBC WITH DIFFERENTIAL/PLATELET
Basophils Absolute: 0.1 10*3/uL (ref 0.0–0.2)
Basos: 0 %
EOS (ABSOLUTE): 0.2 10*3/uL (ref 0.0–0.4)
Eos: 2 %
Hematocrit: 29.8 % — ABNORMAL LOW (ref 34.0–46.6)
Hemoglobin: 9.8 g/dL — ABNORMAL LOW (ref 11.1–15.9)
Immature Grans (Abs): 0 10*3/uL (ref 0.0–0.1)
Immature Granulocytes: 0 %
Lymphocytes Absolute: 2.5 10*3/uL (ref 0.7–3.1)
Lymphs: 19 %
MCH: 32.5 pg (ref 26.6–33.0)
MCHC: 32.9 g/dL (ref 31.5–35.7)
MCV: 99 fL — ABNORMAL HIGH (ref 79–97)
Monocytes Absolute: 0.9 10*3/uL (ref 0.1–0.9)
Monocytes: 7 %
Neutrophils Absolute: 9.2 10*3/uL — ABNORMAL HIGH (ref 1.4–7.0)
Neutrophils: 72 %
Platelets: 374 10*3/uL (ref 150–450)
RBC: 3.02 x10E6/uL — ABNORMAL LOW (ref 3.77–5.28)
RDW: 13.8 % (ref 11.7–15.4)
WBC: 12.9 10*3/uL — ABNORMAL HIGH (ref 3.4–10.8)

## 2024-05-27 LAB — COMPREHENSIVE METABOLIC PANEL WITH GFR
ALT: 14 [IU]/L (ref 0–32)
AST: 26 [IU]/L (ref 0–40)
Albumin: 4 g/dL (ref 3.8–4.8)
Alkaline Phosphatase: 75 [IU]/L (ref 49–135)
BUN/Creatinine Ratio: 20 (ref 12–28)
BUN: 15 mg/dL (ref 8–27)
Bilirubin Total: 0.3 mg/dL (ref 0.0–1.2)
CO2: 23 mmol/L (ref 20–29)
Calcium: 9.4 mg/dL (ref 8.7–10.3)
Chloride: 103 mmol/L (ref 96–106)
Creatinine, Ser: 0.75 mg/dL (ref 0.57–1.00)
Globulin, Total: 3.1 g/dL (ref 1.5–4.5)
Glucose: 103 mg/dL — ABNORMAL HIGH (ref 70–99)
Potassium: 3.8 mmol/L (ref 3.5–5.2)
Sodium: 138 mmol/L (ref 134–144)
Total Protein: 7.1 g/dL (ref 6.0–8.5)
eGFR: 83 mL/min/{1.73_m2}

## 2024-05-29 ENCOUNTER — Inpatient Hospital Stay: Attending: Physician Assistant

## 2024-05-29 ENCOUNTER — Inpatient Hospital Stay: Admitting: Physician Assistant

## 2024-05-29 VITALS — BP 130/54 | HR 85 | Temp 98.2°F | Resp 18 | Ht 67.01 in | Wt 167.4 lb

## 2024-05-29 DIAGNOSIS — D509 Iron deficiency anemia, unspecified: Secondary | ICD-10-CM

## 2024-05-29 DIAGNOSIS — D649 Anemia, unspecified: Secondary | ICD-10-CM

## 2024-05-29 LAB — CBC WITH DIFFERENTIAL (CANCER CENTER ONLY)
Abs Immature Granulocytes: 0.02 10*3/uL (ref 0.00–0.07)
Basophils Absolute: 0.1 10*3/uL (ref 0.0–0.1)
Basophils Relative: 1 %
Eosinophils Absolute: 0.3 10*3/uL (ref 0.0–0.5)
Eosinophils Relative: 4 %
HCT: 30.8 % — ABNORMAL LOW (ref 36.0–46.0)
Hemoglobin: 10.1 g/dL — ABNORMAL LOW (ref 12.0–15.0)
Immature Granulocytes: 0 %
Lymphocytes Relative: 35 %
Lymphs Abs: 2.9 10*3/uL (ref 0.7–4.0)
MCH: 32 pg (ref 26.0–34.0)
MCHC: 32.8 g/dL (ref 30.0–36.0)
MCV: 97.5 fL (ref 80.0–100.0)
Monocytes Absolute: 1 10*3/uL (ref 0.1–1.0)
Monocytes Relative: 12 %
Neutro Abs: 4.1 10*3/uL (ref 1.7–7.7)
Neutrophils Relative %: 48 %
Platelet Count: 320 10*3/uL (ref 150–400)
RBC: 3.16 MIL/uL — ABNORMAL LOW (ref 3.87–5.11)
RDW: 15.2 % (ref 11.5–15.5)
WBC Count: 8.4 10*3/uL (ref 4.0–10.5)
nRBC: 0 % (ref 0.0–0.2)

## 2024-05-29 LAB — IRON AND IRON BINDING CAPACITY (CC-WL,HP ONLY)
Iron: 91 ug/dL (ref 28–170)
Saturation Ratios: 29 % (ref 10.4–31.8)
TIBC: 321 ug/dL (ref 250–450)
UIBC: 229 ug/dL

## 2024-05-29 LAB — FERRITIN: Ferritin: 530 ng/mL — ABNORMAL HIGH (ref 11–307)

## 2024-05-29 LAB — SAMPLE TO BLOOD BANK

## 2024-06-22 ENCOUNTER — Ambulatory Visit: Admitting: Nurse Practitioner

## 2024-08-27 ENCOUNTER — Inpatient Hospital Stay: Admitting: Physician Assistant

## 2024-08-27 ENCOUNTER — Inpatient Hospital Stay: Attending: Physician Assistant

## 2024-11-12 ENCOUNTER — Ambulatory Visit
